# Patient Record
Sex: Male | Born: 1960 | Race: White | Hispanic: No | Marital: Married | Smoking: Never smoker
Health system: Southern US, Community
[De-identification: ages and names within clinical notes are randomized; demographics above are authoritative.]

## PROBLEM LIST (undated history)

## (undated) DIAGNOSIS — F32A Depression, unspecified: Secondary | ICD-10-CM

## (undated) DIAGNOSIS — F329 Major depressive disorder, single episode, unspecified: Secondary | ICD-10-CM

## (undated) DIAGNOSIS — I10 Essential (primary) hypertension: Secondary | ICD-10-CM

## (undated) DIAGNOSIS — Z9221 Personal history of antineoplastic chemotherapy: Secondary | ICD-10-CM

## (undated) DIAGNOSIS — M109 Gout, unspecified: Secondary | ICD-10-CM

## (undated) DIAGNOSIS — N529 Male erectile dysfunction, unspecified: Secondary | ICD-10-CM

## (undated) DIAGNOSIS — Z9981 Dependence on supplemental oxygen: Secondary | ICD-10-CM

## (undated) DIAGNOSIS — C801 Malignant (primary) neoplasm, unspecified: Secondary | ICD-10-CM

## (undated) HISTORY — DX: Depression, unspecified: F32.A

## (undated) HISTORY — PX: PORTACATH PLACEMENT: SHX2246

## (undated) HISTORY — DX: Gout, unspecified: M10.9

## (undated) HISTORY — DX: Essential (primary) hypertension: I10

## (undated) HISTORY — PX: TONSILLECTOMY: SUR1361

## (undated) HISTORY — DX: Major depressive disorder, single episode, unspecified: F32.9

## (undated) HISTORY — DX: Male erectile dysfunction, unspecified: N52.9

---

## 2015-06-23 ENCOUNTER — Encounter: Payer: Self-pay | Admitting: Family Medicine

## 2015-06-23 ENCOUNTER — Ambulatory Visit (INDEPENDENT_AMBULATORY_CARE_PROVIDER_SITE_OTHER): Payer: PRIVATE HEALTH INSURANCE | Admitting: Family Medicine

## 2015-06-23 VITALS — BP 149/93 | HR 76 | Temp 97.6°F | Ht 65.8 in | Wt 247.0 lb

## 2015-06-23 DIAGNOSIS — F329 Major depressive disorder, single episode, unspecified: Secondary | ICD-10-CM

## 2015-06-23 DIAGNOSIS — I10 Essential (primary) hypertension: Secondary | ICD-10-CM

## 2015-06-23 DIAGNOSIS — F32A Depression, unspecified: Secondary | ICD-10-CM | POA: Insufficient documentation

## 2015-06-23 MED ORDER — TELMISARTAN 40 MG PO TABS: 40.0000 mg | ORAL_TABLET | Freq: Every day | ORAL | Status: DC

## 2015-06-23 MED ORDER — AMLODIPINE BESYLATE 10 MG PO TABS: 10.0000 mg | ORAL_TABLET | Freq: Every day | ORAL | Status: DC

## 2015-06-23 MED ORDER — FLUOXETINE HCL 20 MG PO CAPS: 20.0000 mg | ORAL_CAPSULE | Freq: Every day | ORAL | Status: DC

## 2015-06-23 NOTE — Assessment & Plan Note (Signed)
The current medical regimen is effective;  continue present plan and medications.  

## 2015-06-23 NOTE — Assessment & Plan Note (Signed)
poor control blood pressure will start Micardis Continue other medications

## 2015-06-23 NOTE — Progress Notes (Signed)
   BP 149/93 mmHg  Pulse 76  Temp(Src) 97.6 F (36.4 C)  Ht 5' 5.8" (1.671 m)  Wt 247 lb (112.038 kg)  BMI 40.12 kg/m2  SpO2 97%   Subjective:    Patient ID: Stuart Spence, male    DOB: 1960/09/25, 55 y.o.   MRN: [id number]  HPI: Stuart Spence is a 55 y.o. male  Chief Complaint  Patient presents with  . Hypertension  . Depression  . "thing on the back of my head"   patient follow-up hypertension doing well on amlodipine 10 mg but not taking losartan and his insurance quit paying for it blood pressures been elevated as a consequence.  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   Relevant past medical, surgical, family and social history reviewed and updated as indicated. Interim medical history since our last visit reviewed. Allergies and medications reviewed and updated.  Review of Systems  Constitutional: Negative.   Respiratory: Negative.   Cardiovascular: Negative.     Per HPI unless specifically indicated above     Objective:    BP 149/93 mmHg  Pulse 76  Temp(Src) 97.6 F (36.4 C)  Ht 5' 5.8" (1.671 m)  Wt 247 lb (112.038 kg)  BMI 40.12 kg/m2  SpO2 97%  Wt Readings from Last 3 Encounters:  06/23/15 247 lb (112.038 kg)  08/05/14 239 lb (108.41 kg)    Physical Exam  Constitutional: He is oriented to person, place, and time. He appears well-developed and well-nourished. No distress.  HENT:  Head: Normocephalic and atraumatic.  Right Ear: Hearing normal.  Left Ear: Hearing normal.  Nose: Nose normal.  Eyes: Conjunctivae and lids are normal. Right eye exhibits no discharge. Left eye exhibits no discharge. No scleral icterus.  Cardiovascular: Normal rate, regular rhythm and normal heart sounds.   Pulmonary/Chest: Effort normal and breath sounds normal. No respiratory distress.   Musculoskeletal: Normal range of motion.  Neurological: He is alert and oriented to person, place, and time.  Skin: Skin is intact. No rash noted.  Psychiatric: He has a normal mood and affect. His speech is normal and behavior is normal. Judgment and thought content normal. Cognition and memory are normal.    No results found for this or any previous visit.    Assessment & Plan:   Problem List Items Addressed This Visit      Cardiovascular and Mediastinum   Essential hypertension - Primary     poor control blood pressure will start Micardis Continue other medications      Relevant Medications   amLODipine (NORVASC) 10 MG tablet   telmisartan (MICARDIS) 40 MG tablet     Other   Depression    The current medical regimen is effective;  continue present plan and medications.       Relevant Medications   FLUoxetine (PROZAC) 20 MG capsule       Follow up plan: Return in about 4 weeks (around 07/21/2015) for Physical Exam bmp.

## 2015-06-24 ENCOUNTER — Telehealth: Payer: Self-pay

## 2015-06-24 NOTE — Telephone Encounter (Signed)
Patient's insurance will not cover Micardis  Instructed patient to call his insurance and find out what were his preferred drugs and to let us know.

## 2015-06-26 ENCOUNTER — Telehealth: Payer: Self-pay

## 2015-06-26 NOTE — Telephone Encounter (Signed)
Patient's insurance will not cover Micardis  He states they only told him to try an Ace Inhibitor but would not give him a list of preferred medications

## 2015-06-30 MED ORDER — BENAZEPRIL HCL 40 MG PO TABS: 40.0000 mg | ORAL_TABLET | Freq: Every day | ORAL | Status: DC

## 2015-07-10 ENCOUNTER — Telehealth: Payer: Self-pay

## 2015-07-10 NOTE — Telephone Encounter (Signed)
Patient would like Rx for Viagra sent to [facility]  He knows [facility] is out of office until Monday

## 2015-07-14 MED ORDER — SILDENAFIL CITRATE 100 MG PO TABS: 100.0000 mg | ORAL_TABLET | Freq: Every day | ORAL | Status: DC | PRN

## 2015-08-04 ENCOUNTER — Encounter: Payer: PRIVATE HEALTH INSURANCE | Admitting: Family Medicine

## 2015-08-10 ENCOUNTER — Emergency Department: Payer: No Typology Code available for payment source

## 2015-08-10 ENCOUNTER — Inpatient Hospital Stay: Payer: No Typology Code available for payment source

## 2015-08-10 ENCOUNTER — Encounter: Payer: Self-pay | Admitting: Emergency Medicine

## 2015-08-10 ENCOUNTER — Inpatient Hospital Stay
Admission: EM | Admit: 2015-08-10 | Discharge: 2015-08-17 | DRG: 180 | Disposition: A | Discharge status: Home Health | Payer: No Typology Code available for payment source | Attending: Internal Medicine | Admitting: Internal Medicine

## 2015-08-10 DIAGNOSIS — Z8249 Family history of ischemic heart disease and other diseases of the circulatory system: Secondary | ICD-10-CM | POA: Diagnosis not present

## 2015-08-10 DIAGNOSIS — C3411 Malignant neoplasm of upper lobe, right bronchus or lung: Secondary | ICD-10-CM | POA: Diagnosis present

## 2015-08-10 DIAGNOSIS — Z79899 Other long term (current) drug therapy: Secondary | ICD-10-CM

## 2015-08-10 DIAGNOSIS — F329 Major depressive disorder, single episode, unspecified: Secondary | ICD-10-CM | POA: Diagnosis present

## 2015-08-10 DIAGNOSIS — R918 Other nonspecific abnormal finding of lung field: Secondary | ICD-10-CM

## 2015-08-10 DIAGNOSIS — J948 Other specified pleural conditions: Secondary | ICD-10-CM | POA: Diagnosis not present

## 2015-08-10 DIAGNOSIS — J90 Pleural effusion, not elsewhere classified: Secondary | ICD-10-CM | POA: Insufficient documentation

## 2015-08-10 DIAGNOSIS — I10 Essential (primary) hypertension: Secondary | ICD-10-CM | POA: Diagnosis present

## 2015-08-10 DIAGNOSIS — C7951 Secondary malignant neoplasm of bone: Secondary | ICD-10-CM | POA: Diagnosis not present

## 2015-08-10 DIAGNOSIS — J9601 Acute respiratory failure with hypoxia: Secondary | ICD-10-CM | POA: Diagnosis present

## 2015-08-10 DIAGNOSIS — Z09 Encounter for follow-up examination after completed treatment for conditions other than malignant neoplasm: Secondary | ICD-10-CM

## 2015-08-10 DIAGNOSIS — E222 Syndrome of inappropriate secretion of antidiuretic hormone: Secondary | ICD-10-CM | POA: Diagnosis present

## 2015-08-10 DIAGNOSIS — Z833 Family history of diabetes mellitus: Secondary | ICD-10-CM

## 2015-08-10 DIAGNOSIS — F32A Depression, unspecified: Secondary | ICD-10-CM

## 2015-08-10 DIAGNOSIS — R0602 Shortness of breath: Secondary | ICD-10-CM

## 2015-08-10 DIAGNOSIS — J189 Pneumonia, unspecified organism: Secondary | ICD-10-CM | POA: Diagnosis present

## 2015-08-10 DIAGNOSIS — Z801 Family history of malignant neoplasm of trachea, bronchus and lung: Secondary | ICD-10-CM | POA: Diagnosis not present

## 2015-08-10 DIAGNOSIS — J9811 Atelectasis: Secondary | ICD-10-CM | POA: Diagnosis present

## 2015-08-10 DIAGNOSIS — R188 Other ascites: Secondary | ICD-10-CM

## 2015-08-10 DIAGNOSIS — N529 Male erectile dysfunction, unspecified: Secondary | ICD-10-CM | POA: Diagnosis present

## 2015-08-10 DIAGNOSIS — R0600 Dyspnea, unspecified: Secondary | ICD-10-CM | POA: Diagnosis present

## 2015-08-10 DIAGNOSIS — J9819 Other pulmonary collapse: Secondary | ICD-10-CM | POA: Diagnosis not present

## 2015-08-10 DIAGNOSIS — J910 Malignant pleural effusion: Secondary | ICD-10-CM | POA: Diagnosis present

## 2015-08-10 DIAGNOSIS — Z9889 Other specified postprocedural states: Secondary | ICD-10-CM

## 2015-08-10 DIAGNOSIS — R0603 Acute respiratory distress: Secondary | ICD-10-CM

## 2015-08-10 DIAGNOSIS — J984 Other disorders of lung: Secondary | ICD-10-CM

## 2015-08-10 DIAGNOSIS — J9691 Respiratory failure, unspecified with hypoxia: Secondary | ICD-10-CM | POA: Diagnosis present

## 2015-08-10 DIAGNOSIS — M109 Gout, unspecified: Secondary | ICD-10-CM | POA: Diagnosis present

## 2015-08-10 DIAGNOSIS — C3481 Malignant neoplasm of overlapping sites of right bronchus and lung: Secondary | ICD-10-CM | POA: Diagnosis not present

## 2015-08-10 LAB — BLOOD GAS, VENOUS
ACID-BASE EXCESS: 6.5 mmol/L — AB (ref 0.0–3.0)
Bicarbonate: 31.3 mEq/L — ABNORMAL HIGH (ref 21.0–28.0)
O2 SAT: 94.7 %
PCO2 VEN: 44 mmHg (ref 44.0–60.0)
PH VEN: 7.46 — AB (ref 7.320–7.430)
PO2 VEN: 70 mmHg — AB (ref 31.0–45.0)
Patient temperature: 37

## 2015-08-10 LAB — COMPREHENSIVE METABOLIC PANEL
ALBUMIN: 3.2 g/dL — AB (ref 3.5–5.0)
ALK PHOS: 90 U/L (ref 38–126)
ALT: 28 U/L (ref 17–63)
ANION GAP: 10 (ref 5–15)
AST: 25 U/L (ref 15–41)
BILIRUBIN TOTAL: 0.7 mg/dL (ref 0.3–1.2)
BUN: 15 mg/dL (ref 6–20)
CALCIUM: 8.6 mg/dL — AB (ref 8.9–10.3)
CO2: 27 mmol/L (ref 22–32)
CREATININE: 1.01 mg/dL (ref 0.61–1.24)
Chloride: 95 mmol/L — ABNORMAL LOW (ref 101–111)
GFR calc Af Amer: >60 mL/min (ref >60)
GFR calc non Af Amer: >60 mL/min (ref >60)
Glucose, Bld: 154 mg/dL — ABNORMAL HIGH (ref 65–99)
Potassium: 3.7 mmol/L (ref 3.5–5.1)
SODIUM: 132 mmol/L — AB (ref 135–145)
Total Protein: 7.1 g/dL (ref 6.5–8.1)

## 2015-08-10 LAB — CBC
HEMATOCRIT: 38.9 % — AB (ref 40.0–52.0)
HEMOGLOBIN: 13.1 g/dL (ref 13.0–18.0)
MCH: 30.6 pg (ref 26.0–34.0)
MCHC: 33.6 g/dL (ref 32.0–36.0)
MCV: 91.1 fL (ref 80.0–100.0)
Platelets: 372 10*3/uL (ref 150–440)
RBC: 4.27 MIL/uL — AB (ref 4.40–5.90)
RDW: 13.2 % (ref 11.5–14.5)
WBC: 11.7 10*3/uL — ABNORMAL HIGH (ref 3.8–10.6)

## 2015-08-10 LAB — GLUCOSE, CAPILLARY: Glucose-Capillary: 186 mg/dL — ABNORMAL HIGH (ref 65–99)

## 2015-08-10 LAB — OSMOLALITY, URINE: Osmolality, Ur: 253 mOsm/kg — ABNORMAL LOW (ref 300–900)

## 2015-08-10 LAB — SODIUM, URINE, RANDOM: Sodium, Ur: 77 mmol/L

## 2015-08-10 LAB — BRAIN NATRIURETIC PEPTIDE: B NATRIURETIC PEPTIDE 5: 70 pg/mL (ref 0.0–100.0)

## 2015-08-10 LAB — TROPONIN I

## 2015-08-10 MED ORDER — IPRATROPIUM-ALBUTEROL 0.5-2.5 (3) MG/3ML IN SOLN
3.0000 mL | RESPIRATORY_TRACT | Status: DC
  Administered 2015-08-10 – 2015-08-15 (×30): 3 mL via RESPIRATORY_TRACT
  Filled 2015-08-10 (×30): qty 3

## 2015-08-10 MED ORDER — FLUOXETINE HCL 20 MG PO CAPS
20.0000 mg | ORAL_CAPSULE | Freq: Every day | ORAL | Status: DC
  Administered 2015-08-11 – 2015-08-12 (×2): 20 mg via ORAL
  Filled 2015-08-10 (×2): qty 1

## 2015-08-10 MED ORDER — ONDANSETRON HCL 4 MG PO TABS: 4.0000 mg | ORAL_TABLET | Freq: Four times a day (QID) | ORAL | Status: DC | PRN

## 2015-08-10 MED ORDER — OXYCODONE HCL 5 MG PO TABS
5.0000 mg | ORAL_TABLET | ORAL | Status: DC | PRN
  Administered 2015-08-10: 5 mg via ORAL
  Filled 2015-08-10: qty 1

## 2015-08-10 MED ORDER — FUROSEMIDE 10 MG/ML IJ SOLN
40.0000 mg | Freq: Once | INTRAMUSCULAR | Status: DC
  Filled 2015-08-10: qty 4

## 2015-08-10 MED ORDER — BENAZEPRIL HCL 20 MG PO TABS
40.0000 mg | ORAL_TABLET | Freq: Every day | ORAL | Status: DC
  Administered 2015-08-11 – 2015-08-17 (×7): 40 mg via ORAL
  Filled 2015-08-10 (×7): qty 2

## 2015-08-10 MED ORDER — AMLODIPINE BESYLATE 10 MG PO TABS
10.0000 mg | ORAL_TABLET | Freq: Every day | ORAL | Status: DC
  Administered 2015-08-10 – 2015-08-17 (×8): 10 mg via ORAL
  Filled 2015-08-10 (×8): qty 1

## 2015-08-10 MED ORDER — FUROSEMIDE 10 MG/ML IJ SOLN
40.0000 mg | Freq: Two times a day (BID) | INTRAMUSCULAR | Status: DC
  Administered 2015-08-11: 40 mg via INTRAVENOUS
  Filled 2015-08-10: qty 4

## 2015-08-10 MED ORDER — MORPHINE SULFATE (PF) 2 MG/ML IV SOLN
2.0000 mg | INTRAVENOUS | Status: DC | PRN
  Administered 2015-08-10 – 2015-08-16 (×6): 2 mg via INTRAVENOUS
  Filled 2015-08-10 (×6): qty 1

## 2015-08-10 MED ORDER — ACETAMINOPHEN 650 MG RE SUPP: 650.0000 mg | Freq: Four times a day (QID) | RECTAL | Status: DC | PRN

## 2015-08-10 MED ORDER — ACETAMINOPHEN 325 MG PO TABS: 650.0000 mg | ORAL_TABLET | Freq: Four times a day (QID) | ORAL | Status: DC | PRN

## 2015-08-10 MED ORDER — ENOXAPARIN SODIUM 40 MG/0.4ML ~~LOC~~ SOLN
40.0000 mg | SUBCUTANEOUS | Status: DC
  Filled 2015-08-10: qty 0.4

## 2015-08-10 MED ORDER — IOPAMIDOL (ISOVUE-370) INJECTION 76%
75.0000 mL | Freq: Once | INTRAVENOUS | Status: AC | PRN
  Administered 2015-08-10: 75 mL via INTRAVENOUS

## 2015-08-10 MED ORDER — ENOXAPARIN SODIUM 40 MG/0.4ML ~~LOC~~ SOLN
40.0000 mg | SUBCUTANEOUS | Status: DC
  Administered 2015-08-10: 20:00:00 40 mg via SUBCUTANEOUS

## 2015-08-10 MED ORDER — METHYLPREDNISOLONE SODIUM SUCC 40 MG IJ SOLR
40.0000 mg | INTRAMUSCULAR | Status: DC
  Administered 2015-08-10 – 2015-08-12 (×3): 40 mg via INTRAVENOUS
  Filled 2015-08-10 (×3): qty 1

## 2015-08-10 MED ORDER — LABETALOL HCL 5 MG/ML IV SOLN
10.0000 mg | INTRAVENOUS | Status: DC | PRN
  Administered 2015-08-10: 10 mg via INTRAVENOUS
  Filled 2015-08-10: qty 4

## 2015-08-10 MED ORDER — FUROSEMIDE 10 MG/ML IJ SOLN
40.0000 mg | Freq: Once | INTRAMUSCULAR | Status: AC
  Administered 2015-08-10: 20:00:00 40 mg via INTRAVENOUS

## 2015-08-10 MED ORDER — ONDANSETRON HCL 4 MG/2ML IJ SOLN
4.0000 mg | Freq: Four times a day (QID) | INTRAMUSCULAR | Status: DC | PRN
  Administered 2015-08-13: 4 mg via INTRAVENOUS

## 2015-08-10 NOTE — Consult Note (Signed)
PULMONARY / CRITICAL CARE MEDICINE   Name: Stuart Spence MRN: [id number] DOB: [birth year]    ADMISSION DATE:  08/10/2015   CONSULTATION DATE:  08/10/2015  REFERRING MD:  Hospitalist  CHIEF COMPLAINT: lung mass on CT, tachypnea and hypoxia  HISTORY OF PRESENT ILLNESS:  55 YO Caucasian male with a PMH significant for depression, ED, hypertension and gout who presented to teh ED with acute dyspnea. He was initially seen at Dr. Gust Brooms office a week ago and diagnosed with a lung mass. He was due for a biopsy 08/11/15 however, he decided to come to the ED due to worsening dyspnea. His O2 saturation on presentation was ~96% on 4L [place]. He was admitted to the floor but became more tachypneic and hypoxic. CT chest showed a possible neoplastic obstructing right lung mass with complete collapse of the right lung and a large right pleural effusion. PCCM was consulted for further management. Patient is awake and c/o right chest wall pain and persistent dyspnea.    PAST MEDICAL HISTORY :  He  has a past medical history of Depression; Gout; ED (erectile dysfunction); and Hypertension.  PAST SURGICAL HISTORY: He  has past surgical history that includes Tonsillectomy.  No Known Allergies  No current facility-administered medications on file prior to encounter.   Current Outpatient Prescriptions on File Prior to Encounter  Medication Sig  . amLODipine (NORVASC) 10 MG tablet Take 1 tablet (10 mg total) by mouth daily.  . benazepril (LOTENSIN) 40 MG tablet Take 1 tablet (40 mg total) by mouth daily.  . colchicine 0.6 MG tablet Take 0.6 mg by mouth daily as needed (two pills by mouth at onset, the one pill one hour later, maximum three pills per gout flare).  Marland Kitchen FLUoxetine (PROZAC) 20 MG capsule Take 1 capsule (20 mg total) by mouth daily.  . sildenafil (VIAGRA) 100 MG tablet Take 1 tablet (100 mg total) by mouth daily as needed for erectile dysfunction.    FAMILY HISTORY:  His indicated that his mother  is deceased. He indicated that his father is deceased. He indicated that his sister is alive.   SOCIAL HISTORY: He  reports that he has never smoked. He has never used smokeless tobacco. He reports that he does not drink alcohol or use illicit drugs.  REVIEW OF SYSTEMS:   Constitutional: Negative for fever and chills.  HENT: Negative for congestion and rhinorrhea.  Eyes: Negative for redness and visual disturbance.  Respiratory: Positive for shortness of breath, mild lower extremity edema but negative for wheezing.  Cardiovascular: Negative for chest pain and palpitations.  Gastrointestinal: Negative  for nausea , vomiting and abdominal pain and loose stools Genitourinary: Negative for dysuria and urgency.  Musculoskeletal: Negative for myalgias and arthralgias but positive for right chest wall pain.  Skin: Negative for pallor and wound.  Neurological: Negative for dizziness and headaches   SUBJECTIVE:   VITAL SIGNS: BP 134/75 mmHg  Pulse 98  Temp(Src) 98 F (36.7 C) (Oral)  Resp 24  Ht '5\' 7"'$  (1.702 m)  Wt 257 lb 0.9 oz (116.6 kg)  BMI 40.25 kg/m2  SpO2 96%  HEMODYNAMICS:    VENTILATOR SETTINGS: Vent Mode:  [-]  FiO2 (%):  [36 %-42 %] 42 %  INTAKE / OUTPUT:    PHYSICAL EXAMINATION: General: mild respiratory distress Neuro: AAO X4, no focal deficits HEENT: [place]/AT, PERRLA, oral mucosa pink, trachea midline Cardiovascular: RRR, S1/S2, no MRG Lungs:  Increased WOB, limited airflow right lung field, worse in the bases, left lung  field without wheezing Abdomen: Non-distended, soft, normal bowel sounds Musculoskeletal: +ROM Skin: Warm and dry  LABS:  BMET  Recent Labs Lab 08/10/15 1554  NA 132*  K 3.7  CL 95*  CO2 27  BUN 15  CREATININE 1.01  GLUCOSE 154*    Electrolytes  Recent Labs Lab 08/10/15 1554  CALCIUM 8.6*    CBC  Recent Labs Lab 08/10/15 1554  WBC 11.7*  HGB 13.1  HCT 38.9*  PLT 372    Coag's No results for input(s): APTT, INR  in the last 168 hours.  Sepsis Markers No results for input(s): LATICACIDVEN, PROCALCITON, O2SATVEN in the last 168 hours.  ABG No results for input(s): PHART, PCO2ART, PO2ART in the last 168 hours.  Liver Enzymes  Recent Labs Lab 08/10/15 1554  AST 25  ALT 28  ALKPHOS 90  BILITOT 0.7  ALBUMIN 3.2*    Cardiac Enzymes  Recent Labs Lab 08/10/15 1554  TROPONINI <0.03    Glucose  Recent Labs Lab 08/10/15 2305  GLUCAP 186*    Imaging Ct Angio Chest Pe W/cm &/or Wo Cm  08/10/2015  CLINICAL DATA:  55 year old male with shortness of breath. Recent diagnosis of right-sided pulmonary mass of uncertain etiology. EXAM: CT ANGIOGRAPHY CHEST WITH CONTRAST TECHNIQUE: Multidetector CT imaging of the chest was performed using the standard protocol during bolus administration of intravenous contrast. Multiplanar CT image reconstructions and MIPs were obtained to evaluate the vascular anatomy. CONTRAST:  75 cc Isovue 370 COMPARISON:  None. FINDINGS: There is complete collapse of the right lung. There is an ill-defined 4.5 x 4.5 cm area of hypodensity in the right upper lobe with small areas of air bronchogram. This is incompletely characterized but is concerning for a centrally obstructing mass and likely correspond to the mass seen on the recent CT. There is a large right pleural effusion with extension into the posterior mediastinum. Stop the left lung is clear. There is near complete occlusion of the right upper lobe bronchus as well as occlusion of the right middle and right lower lobe bronchi. The left main bronchus and its branches are patent. The thoracic aorta appears unremarkable. Evaluation of the pulmonary arteries is limited due to is respiratory motion artifact and suboptimal visualization of the peripheral branches. No definite pulmonary artery embolus identified. There is no cardiomegaly or pericardial effusion. No hilar or mediastinal adenopathy identified. The esophagus and the  thyroid gland appear grossly unremarkable. There is no axillary adenopathy. The chest wall soft tissues appear unremarkable. There is mild degenerative changes of the spine. There is a 1.6 x 0.8 cm lytic lesion in the posterior aspect of the right for 3. There is also lytic and expansile lesions in the anterior aspect of the right fourth rib with cortical destruction of the rib. The visualized upper abdomen appears unremarkable. Review of the MIP images confirms the above findings. IMPRESSION: Ill-defined hypodense lesion in the right upper lobe concerning for a centrally obstructing neoplastic process. There is complete occlusion of the right upper, right middle, and right lower lobe bronchi with complete collapse of the right lung. Large right pleural effusion. 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 No CT evidence of pulmonary embolism. Electronically Signed   By: Anner Crete M.D.   On: 08/10/2015 21:41   US Abdomen Limited  08/10/2015  CLINICAL DATA:  55 year old male with abnormal chest x-ray. Subsequent encounter. EXAM: LIMITED ABDOMEN ULTRASOUND FOR ASCITES TECHNIQUE: Limited ultrasound survey for ascites was performed in  all four abdominal quadrants. COMPARISON:  08/10/2015 chest x-ray.  No comparison ultrasound. FINDINGS: No ascites visualized. Evaluation limited by patient's habitus and difficulty holding breath. Right-sided pleural effusion. IMPRESSION: No ascites visualized. Evaluation limited by patient's habitus and difficulty holding breath. Right-sided pleural effusion. Electronically Signed   By: Genia Del M.D.   On: 08/10/2015 18:19   Dg Chest Portable 1 View  08/10/2015  CLINICAL DATA:  55 year old male with a history of cold sweats and difficulty breathing. History of potential right lung mass. EXAM: PORTABLE CHEST 1 VIEW COMPARISON:  None. FINDINGS: Complete opacification of the right hemi thorax. No significant leftward shift. Tracheal  air stripe maintained. Aeration on the left relatively maintained. No evidence of left-sided pneumothorax, confluent airspace disease, or pleural effusion. IMPRESSION: Complete opacification the right hemi thorax, potentially a combination of pleural effusion, atelectasis, and/ or consolidation. Given history of mass, with no available comparison. Correlation with any outside imaging may be useful if there is concern for malignancy. Signed, Dulcy Fanny. Earleen Newport, DO [facility] Radiology Electronically Signed   By: Corrie Mckusick D.O.   On: 08/10/2015 16:56    STUDIES:  none  CULTURES: MRSA screen-negative Blood cultures X 2 05/07>  ANTIBIOTICS: Levaquin  SIGNIFICANT EVENTS: 05/07>Admitted with lung mass, right pleural effusion and right lung collapse  LINES/TUBES: PIVs  DISCUSSION: 54 yo male presenting with possible neoplastic lung mass, total right lung collapse, large right pleural effusion and post-obstruction pneumonia; hypoxic requiring high flow [place].   ASSESSMENT / PLAN:  PULMONARY A: Acute respiratory failure 2/2 occlusive right lung mass and pleural effusion Post-obstructive pneumonia Right pleural effusion Right lung collapse P:   -Supplemental O2 with HFNC as tolerated -Nebulized bronchodilator -US guided thoracentesis with cultures and cytology -Empiric Abx -Will probably need a diagnostic bronch if pleural fluid is non-diagnostic  CARDIOVASCULAR A:  H/o Hypertension P:  -Continue home BP meds  RENAL A:   Hyponatremia P:   -monitor and replace lectrolytes  INFECTIOUS A:   Post-obstructive pneumonia P:   -Levaquin -F/U cultures  MS A:   H/O Gout  P:   Continue home meds  NEUROLOGIC A:   Depression ED P:   RASS goal: N/A -Monitor neuro status -Resume home antidepressant   Disposition and family update: Patient's wife and sisters updated at bedside. Will await culture/cytology results from  thoracentesis.  Best Practice: Code Status:  Full. Diet: NPO for thora GI prophylaxis:  n/a. VTE prophylaxis:  SCD's / lovenox   Magdalene S. [facility] ANP-BC Pulmonary and [place] Pager [phone] or [phone]  08/10/2015, 11:26 PM  Pt seen by NP, reviewed physical exam, assessment and plan and agree. Agree with thoracentesis of right pleural effusion. Marda Stalker, M.D.  08/11/2015

## 2015-08-10 NOTE — Progress Notes (Signed)
Pt wife, [name] [phone]) notified pt will be transferred to ICU.

## 2015-08-10 NOTE — H&P (Signed)
[place] [facility] [place] NAME: Stuart Spence    MR#:  [id number]  DATE OF BIRTH:  Apr 22, 1960   DATE OF ADMISSION:  08/10/2015  PRIMARY CARE PHYSICIAN: Golden Pop, MD   REQUESTING/REFERRING PHYSICIAN: Mariea Clonts  CHIEF COMPLAINT:   Chief Complaint  Patient presents with  . Respiratory Distress    HISTORY OF PRESENT ILLNESS:  Stuart Spence  is a 55 y.o. male with a known history of Essential hypertension who is presenting with shortness of breath. He describes having progressive shortness of breath originally dyspnea on exertion but now even at rest for about 1 month. He went to an outside facility about a week ago given symptoms of nonproductive cough and shortness of breath pleuritic chest pain at that time had a chest x-ray and CAT scan performed which revealed a right lung mass she is to follow-up with Dr. Raul Del of pulmonary 5/8/ 2017 for potential biopsy. In the interim his symptoms have progressed he also complains of lower extremity edema and abdominal fullness without nausea vomiting or constipation. He is apparently had around 20 pound weight gain in the past 1-2 weeks. PAST MEDICAL HISTORY:   Past Medical History  Diagnosis Date  . Depression   . Gout   . ED (erectile dysfunction)     PAST SURGICAL HISTORY:   Past Surgical History  Procedure Laterality Date  . Tonsillectomy      SOCIAL HISTORY:   Social History  Substance Use Topics  . Smoking status: Never Smoker   . Smokeless tobacco: Never Used  . Alcohol Use: No     Comment: gave it up in August    FAMILY HISTORY:   Family History  Problem Relation Age of Onset  . Cancer Mother 24    lung  . Heart attack Father 72  . Hypertension Father   . Congestive Heart Failure Father 34    died from  . Diabetes Sister     DRUG ALLERGIES:  No Known Allergies  REVIEW OF SYSTEMS:  REVIEW OF SYSTEMS:  CONSTITUTIONAL: Denies fevers, chills, Positive fatigue, weakness.    EYES: Denies blurred vision, double vision, or eye pain.  EARS, NOSE, THROAT: Denies tinnitus, ear pain, hearing loss.  RESPIRATORY: Positive cough, shortness of breath, denies wheezing  CARDIOVASCULAR: Denies chest pain, palpitations, positive edema.  GASTROINTESTINAL: Denies nausea, vomiting, diarrhea, abdominal pain.  GENITOURINARY: Denies dysuria, hematuria.  ENDOCRINE: Denies nocturia or thyroid problems. HEMATOLOGIC AND LYMPHATIC: Denies easy bruising or bleeding.  SKIN: Denies rash or lesions.  MUSCULOSKELETAL: Denies pain in neck, back, shoulder, knees, hips, or further arthritic symptoms.  NEUROLOGIC: Denies paralysis, paresthesias.  PSYCHIATRIC: Denies anxiety or depressive symptoms. Otherwise full review of systems performed by me is negative.   MEDICATIONS AT HOME:   Prior to Admission medications   Medication Sig Start Date End Date Taking? Authorizing Provider  amLODipine (NORVASC) 10 MG tablet Take 1 tablet (10 mg total) by mouth daily. 06/23/15   Guadalupe Maple, MD  benazepril (LOTENSIN) 40 MG tablet Take 1 tablet (40 mg total) by mouth daily. 06/30/15   Guadalupe Maple, MD  colchicine 0.6 MG tablet Take 0.6 mg by mouth daily as needed (two pills by mouth at onset, the one pill one hour later, maximum three pills per gout flare).    Historical Provider, MD  FLUoxetine (PROZAC) 20 MG capsule Take 1 capsule (20 mg total) by mouth daily. 06/23/15   Guadalupe Maple, MD  sildenafil (VIAGRA)  100 MG tablet Take 1 tablet (100 mg total) by mouth daily as needed for erectile dysfunction. 07/14/15   Guadalupe Maple, MD      VITAL SIGNS:  Blood pressure 131/85, pulse 102, temperature 98.2 F (36.8 C), temperature source Oral, resp. rate 17, height '5\' 7"'$  (1.702 m), weight 116.121 kg (256 lb), SpO2 95 %.  PHYSICAL EXAMINATION:  VITAL SIGNS: Filed Vitals:   08/10/15 1615 08/10/15 1630  BP:  131/85  Pulse: 107 102  Temp:    Resp: 23 17   GENERAL:54 y.o.male currently in no  acute distress.  HEAD: Normocephalic, atraumatic.  EYES: Pupils equal, round, reactive to light. Extraocular muscles intact. No scleral icterus.  MOUTH: Moist mucosal membrane. Dentition intact. No abscess noted.  EAR, NOSE, THROAT: Clear without exudates. No external lesions.  NECK: Supple. No thyromegaly. No nodules. No JVD.  PULMONARY: Diminished breath sounds on the right to about the mid lung field without wheeze rails or rhonci. No use of accessory muscles, Good respiratory effort. Poor air entry bilaterally CHEST: Nontender to palpation.  CARDIOVASCULAR: S1 and S2. Tachycardic No murmurs, rubs, or gallops. 2 plus edema. Pedal pulses 2+ bilaterally.  GASTROINTESTINAL: Tense, nontender, nondistended. No masses. Positive bowel sounds. No hepatosplenomegaly.  MUSCULOSKELETAL: No swelling, clubbing, or edema. Range of motion full in all extremities.  NEUROLOGIC: Cranial nerves II through XII are intact. No gross focal neurological deficits. Sensation intact. Reflexes intact.  SKIN: No ulceration, lesions, rashes, or cyanosis. Skin warm and dry. Turgor intact.  PSYCHIATRIC: Mood, affect within normal limits. The patient is awake, alert and oriented x 3. Insight, judgment intact.    LABORATORY PANEL:   CBC  Recent Labs Lab 08/10/15 1554  WBC 11.7*  HGB 13.1  HCT 38.9*  PLT 372   ------------------------------------------------------------------------------------------------------------------  Chemistries   Recent Labs Lab 08/10/15 1554  NA 132*  K 3.7  CL 95*  CO2 27  GLUCOSE 154*  BUN 15  CREATININE 1.01  CALCIUM 8.6*  AST 25  ALT 28  ALKPHOS 90  BILITOT 0.7   ------------------------------------------------------------------------------------------------------------------  Cardiac Enzymes  Recent Labs Lab 08/10/15 1554  TROPONINI <0.03    ------------------------------------------------------------------------------------------------------------------  RADIOLOGY:  Dg Chest Portable 1 View  08/10/2015  CLINICAL DATA:  55 year old male with a history of cold sweats and difficulty breathing. History of potential right lung mass. EXAM: PORTABLE CHEST 1 VIEW COMPARISON:  None. FINDINGS: Complete opacification of the right hemi thorax. No significant leftward shift. Tracheal air stripe maintained. Aeration on the left relatively maintained. No evidence of left-sided pneumothorax, confluent airspace disease, or pleural effusion. IMPRESSION: Complete opacification the right hemi thorax, potentially a combination of pleural effusion, atelectasis, and/ or consolidation. Given history of mass, with no available comparison. Correlation with any outside imaging may be useful if there is concern for malignancy. Signed, Dulcy Fanny. Earleen Newport, DO [facility] Radiology Electronically Signed   By: Corrie Mckusick D.O.   On: 08/10/2015 16:56    EKG:   Orders placed or performed during the hospital encounter of 08/10/15  . EKG 12-Lead  . EKG 12-Lead    IMPRESSION AND PLAN:   55 year old Caucasian gentleman history of essential hypertension presenting with shortness of breath. Recently diagnosed with right lung mass. Patient is a nonsmoker but has worked 36 years in the [employer]   1. Acute respiratory insufficiency with hypoxia secondary to pleural effusion. We will diuresis with Lasix, follow in and out, likely require thoracentesis. Consult pulmonary, scheduled breathing treatments  will check CT rule out pulmonary embolism given acute change, tachycardia, tachypnea, concern for malignancy 2. Anasarca: Diuresis, check TSH, check abdominal ultrasound potentially require paracentesis if large volume ascites at that time would cancel thoracentesis 3. Essential hypertension Benzepril, Norvasc 4.  Hyponatremia with volume overload state: Continue diuresis check urine sodium and osmolality 5. Venous thromboembolism prophylactic: Lovenox    All the records are reviewed and case discussed with ED provider. Management plans discussed with the patient, family and they are in agreement.  CODE STATUS: Full  TOTAL TIME TAKING CARE OF THIS PATIENT: 45 minutes.    Tannor Pyon,  Karenann Cai.D on 08/10/2015 at 5:21 PM  Between 7am to 6pm - Pager - [phone]  After 6pm: House Pager: - [place] [facility]  Office  [phone]  CC: Primary care physician; Golden Pop, MD

## 2015-08-10 NOTE — ED Provider Notes (Signed)
[facility] Emergency Department Provider Note  ____________________________________________  Time seen: Approximately 3:52 PM  I have reviewed the triage vital signs and the nursing notes.   HISTORY  Chief Complaint Respiratory Distress    HPI Stuart Spence is a 55 y.o. male with a recent diagnosis of right sided pulmonary mass of unclear etiology at this time, presenting for shortness of breath. The patient reports 1 month of intermittent shortness of breath which is worse with exertion and laying down. He was feeling poorly for days ago was brought to an outside Emergency Department with a CT scanwhich showed a right-sided mass, and he has follow-up with oncology tomorrow. Today, he was sitting at home and eating when he developed acute onset of worsening shortness of breath. He also reports night sweats, and a 20 pound weight gain in the last week. He denies any history of congestive heart failure. He denies any lower extremity swelling or calf pain, fever, chills, cough or cold symptoms, ear pain or sore throat. He has not been experiencing any chest pain, palpitations, lightheadedness or syncope. Denies any recent travel outside the [place], or incarceration. Per EMS, sats were 91% on room air.   Past Medical History  Diagnosis Date  . Depression   . Gout   . ED (erectile dysfunction)     Patient Active Problem List   Diagnosis Date Noted  . Essential hypertension 06/23/2015  . Depression 06/23/2015    Past Surgical History  Procedure Laterality Date  . Tonsillectomy      Current Outpatient Rx  Name  Route  Sig  Dispense  Refill  . amLODipine (NORVASC) 10 MG tablet   Oral   Take 1 tablet (10 mg total) by mouth daily.   30 tablet   6   . aspirin EC 81 MG tablet   Oral   Take 81 mg by mouth daily.         . benazepril (LOTENSIN) 40 MG tablet   Oral   Take 1 tablet (40 mg total) by mouth daily.   30 tablet   6   . colchicine 0.6  MG tablet   Oral   Take 0.6 mg by mouth daily as needed (two pills by mouth at onset, the one pill one hour later, maximum three pills per gout flare).         Marland Kitchen FLUoxetine (PROZAC) 20 MG capsule   Oral   Take 1 capsule (20 mg total) by mouth daily.   30 capsule   6   . sildenafil (VIAGRA) 100 MG tablet   Oral   Take 1 tablet (100 mg total) by mouth daily as needed for erectile dysfunction.   10 tablet   12     Allergies Review of patient's allergies indicates no known allergies.  Family History  Problem Relation Age of Onset  . Cancer Mother 76    lung  . Heart attack Father 41  . Hypertension Father   . Congestive Heart Failure Father 86    died from  . Diabetes Sister     Social History Social History  Substance Use Topics  . Smoking status: Never Smoker   . Smokeless tobacco: Never Used  . Alcohol Use: No     Comment: gave it up in August    Review of Systems Constitutional: Positive night sweats. Negative fever or chills. +20 pound weight gain. Eyes: No visual changes. ENT: No sore throat. No congestion or rhinorrhea. Cardiovascular: Denies chest  pain. Denies palpitations. Respiratory: Positive shortness of breath.  No cough. Gastrointestinal: No abdominal pain.  No nausea, no vomiting.  No diarrhea.  No constipation. Genitourinary: Negative for dysuria. Musculoskeletal: Negative for back pain. Skin: Negative for rash. Neurological: Negative for headaches. No focal numbness, tingling or weakness.   10-point ROS otherwise negative.  ____________________________________________   PHYSICAL EXAM:  VITAL SIGNS: ED Triage Vitals  Enc Vitals Group     BP 08/10/15 1545 144/117 mmHg     Pulse Rate 08/10/15 1545 109     Resp 08/10/15 1545 22     Temp 08/10/15 1545 98.2 F (36.8 C)     Temp Source 08/10/15 1545 Oral     SpO2 08/10/15 1545 96 %     Weight 08/10/15 1545 256 lb (116.121 kg)     Height 08/10/15 1545 '5\' 7"'$  (1.702 m)     Head Cir --       Peak Flow --      Pain Score --      Pain Loc --      Pain Edu? --      Excl. in [place]? --     Constitutional: Alert and oriented. Tachypnea And uncomfortable appearing but able to answer questions appropriately, mentating normally and speaking in full sentences.  Eyes: Conjunctivae are normal.  EOMI. No scleral icterus. No eye discharge. Head: Atraumatic. Nose: No congestion/rhinnorhea. Mouth/Throat: Mucous membranes are moist.  Neck: No stridor.  Supple.  No JVD. Cardiovascular: Fast rate, regular rhythm. No murmurs, rubs or gallops.  Respiratory: Positive tachypnea with mild accessory muscle use and mild retractions. The left lung is clear throughout the entire lung field, but the right lung has decreased breath sounds at the base. He has fair air exchange in the upper right side. No wheezes, rales or rhonchi. Gastrointestinal: Obese. Soft, nontender and nondistended.  No guarding or rebound.  No peritoneal signs. Musculoskeletal: No LE edema. No ttp in the calves or palpable cords.  Negative Homan's sign. Neurologic:  A&Ox3.  Speech is clear.  Face and smile are symmetric.  EOMI.  Moves all extremities well. Skin:  Skin is warm, dry and intact. No rash noted. Psychiatric: Mood and affect are normal. Speech and behavior are normal.  Normal judgement.  ____________________________________________   LABS (all labs ordered are listed, but only abnormal results are displayed)  Labs Reviewed  BLOOD GAS, VENOUS - Abnormal; Notable for the following:    pH, Ven 7.46 (*)    pO2, Ven 70.0 (*)    Bicarbonate 31.3 (*)    Acid-Base Excess 6.5 (*)    All other components within normal limits  CBC - Abnormal; Notable for the following:    WBC 11.7 (*)    RBC 4.27 (*)    HCT 38.9 (*)    All other components within normal limits  COMPREHENSIVE METABOLIC PANEL - Abnormal; Notable for the following:    Sodium 132 (*)    Chloride 95 (*)    Glucose, Bld 154 (*)    Calcium 8.6 (*)    Albumin  3.2 (*)    All other components within normal limits  CULTURE, BLOOD (ROUTINE X 2)  CULTURE, BLOOD (ROUTINE X 2)  BRAIN NATRIURETIC PEPTIDE  TROPONIN I   ____________________________________________  EKG  ED ECG REPORT I, Eula Listen, the attending physician, personally viewed and interpreted this ECG.   Date: 08/10/2015  EKG Time: 1547  Rate: 109  Rhythm: sinus tachycardia  Axis: normal  Intervals:none  ST&T Change: no ST elevation  ____________________________________________  RADIOLOGY  No results found.  ____________________________________________   PROCEDURES  Procedure(s) performed: None  Critical Care performed: No ____________________________________________   INITIAL IMPRESSION / ASSESSMENT AND PLAN / ED COURSE  Pertinent labs & imaging results that were available during my care of the patient were reviewed by me and considered in my medical decision making (see chart for details).  55 y.o. with right lung mass of unknown etiology presenting with acute onset of shortness of breath and decreased breath sounds on my examination. Overall, on 2 L cannula, the patient's oxygenation remains above 95%, but he is showing some signs of respiratory compromise. I will start with a portable chest x-ray to rule out pneumothorax although he does have breath sounds on the sides of this less likely, and if we are unable to procure the CT scan results, we will repeat that study today.  ----------------------------------------- 4:49 PM on 08/10/2015 -----------------------------------------  At this time, the patient's respiratory status has improved. His tachypnea is better, and he has continued to have oxygen saturations of greater than 95%. I've been able to wean his oxygen back to 2 L nasal cannula. His VBG is reassuring and his troponin is negative. His BNP also is not elevated. Most likely etiology of the patient's symptoms is the pleural effusion with the  underlying lung mass. The patient's wife has the report from Thursday, which reports a right upper lobe lung mass and diffuse right pleural effusion with significant hilar lymphadenopathy. ___________________ _________________________  FINAL CLINICAL IMPRESSION(S) / ED DIAGNOSES  Final diagnoses:  Respiratory distress  Cavitating mass in right upper lung lobe  Pleural effusion, right      NEW MEDICATIONS STARTED DURING THIS VISIT:  New Prescriptions   No medications on file     Eula Listen, MD 08/10/15 1650

## 2015-08-10 NOTE — Progress Notes (Signed)
Pt admitted for progressive SOB and hypoxia.  Recently found to have lung mass.  Called by nursing with results of CTA chest today which shows total right lung collapse, likely due to obstruction from neoplasm.  Patient O2 sats mid 90's on 4L O2 [place] right now, but with some increased work of breathing.  Will transfer to ICU and consult intensivist as pt is very likely to need intervention.  Jacqulyn Bath [facility] 08/10/2015, 10:16 PM

## 2015-08-10 NOTE — ED Notes (Signed)
Pt presents to ED from home c/o cold sweats that wake him up at night and breathing difficulty . Pt had CT that shoed right lung mass; biopsy with dr.fleming tomorrow. Currently on nasal  Cannula 4L at 95-96%. Alert and oriented x4

## 2015-08-10 NOTE — Progress Notes (Signed)
Dr. Jannifer Franklin notified pt continues to have cool sweats, labored breathing, SOB at rest sitting straight up. Lasix and Solumedrol given as ordered with no noted changes. CT Angio shows complete collapse of Right lung. Will continue to closely monitor as Dr. Jannifer Franklin modifies plan of care and places new orders.

## 2015-08-11 ENCOUNTER — Inpatient Hospital Stay: Payer: No Typology Code available for payment source

## 2015-08-11 LAB — BODY FLUID CELL COUNT WITH DIFFERENTIAL
Eos, Fluid: 0 %
LYMPHS FL: 55 %
MONOCYTE-MACROPHAGE-SEROUS FLUID: 13 %
NEUTROPHIL FLUID: 32 %
Other Cells, Fluid: 0 %
WBC FLUID: 823 uL

## 2015-08-11 LAB — GLUCOSE, CAPILLARY
GLUCOSE-CAPILLARY: 116 mg/dL — AB (ref 65–99)
GLUCOSE-CAPILLARY: 107 mg/dL — AB (ref 65–99)
Glucose-Capillary: 123 mg/dL — ABNORMAL HIGH (ref 65–99)

## 2015-08-11 LAB — BASIC METABOLIC PANEL
Anion gap: 8 (ref 5–15)
BUN: 16 mg/dL (ref 6–20)
CALCIUM: 8.8 mg/dL — AB (ref 8.9–10.3)
CO2: 30 mmol/L (ref 22–32)
CREATININE: 1.13 mg/dL (ref 0.61–1.24)
Chloride: 93 mmol/L — ABNORMAL LOW (ref 101–111)
GFR calc non Af Amer: >60 mL/min (ref >60)
GLUCOSE: 196 mg/dL — AB (ref 65–99)
Potassium: 4.2 mmol/L (ref 3.5–5.1)
Sodium: 131 mmol/L — ABNORMAL LOW (ref 135–145)

## 2015-08-11 LAB — CBC
HEMATOCRIT: 38.8 % — AB (ref 40.0–52.0)
HEMOGLOBIN: 13.3 g/dL (ref 13.0–18.0)
MCH: 31 pg (ref 26.0–34.0)
MCHC: 34.2 g/dL (ref 32.0–36.0)
MCV: 90.6 fL (ref 80.0–100.0)
Platelets: 385 10*3/uL (ref 150–440)
RBC: 4.28 MIL/uL — ABNORMAL LOW (ref 4.40–5.90)
RDW: 13.1 % (ref 11.5–14.5)
WBC: 11.1 10*3/uL — ABNORMAL HIGH (ref 3.8–10.6)

## 2015-08-11 LAB — LACTATE DEHYDROGENASE: LDH: 192 U/L (ref 98–192)

## 2015-08-11 LAB — MRSA PCR SCREENING: MRSA by PCR: NEGATIVE

## 2015-08-11 LAB — GLUCOSE, SEROUS FLUID: GLUCOSE FL: 90 mg/dL

## 2015-08-11 LAB — LACTATE DEHYDROGENASE, PLEURAL OR PERITONEAL FLUID: LD, Fluid: 1570 U/L — ABNORMAL HIGH (ref 3–23)

## 2015-08-11 LAB — ALBUMIN: ALBUMIN: 3.2 g/dL — AB (ref 3.5–5.0)

## 2015-08-11 LAB — AMYLASE: AMYLASE: 50 U/L (ref 28–100)

## 2015-08-11 LAB — PROTEIN, BODY FLUID: Total protein, fluid: 5 g/dL

## 2015-08-11 MED ORDER — ENOXAPARIN SODIUM 40 MG/0.4ML ~~LOC~~ SOLN
40.0000 mg | Freq: Two times a day (BID) | SUBCUTANEOUS | Status: DC
  Administered 2015-08-11 – 2015-08-12 (×3): 40 mg via SUBCUTANEOUS
  Filled 2015-08-11 (×3): qty 0.4

## 2015-08-11 MED ORDER — LEVOFLOXACIN IN D5W 750 MG/150ML IV SOLN
750.0000 mg | INTRAVENOUS | Status: DC
  Administered 2015-08-11: 750 mg via INTRAVENOUS
  Filled 2015-08-11 (×2): qty 150

## 2015-08-11 MED ORDER — INSULIN ASPART 100 UNIT/ML ~~LOC~~ SOLN
0.0000 [IU] | Freq: Three times a day (TID) | SUBCUTANEOUS | Status: DC
  Administered 2015-08-12: 2 [IU] via SUBCUTANEOUS
  Administered 2015-08-12: 3 [IU] via SUBCUTANEOUS
  Administered 2015-08-13: 2 [IU] via SUBCUTANEOUS
  Administered 2015-08-13 – 2015-08-14 (×3): 3 [IU] via SUBCUTANEOUS
  Administered 2015-08-15 (×2): 2 [IU] via SUBCUTANEOUS
  Administered 2015-08-16 – 2015-08-17 (×3): 3 [IU] via SUBCUTANEOUS
  Filled 2015-08-11: qty 3
  Filled 2015-08-11: qty 2
  Filled 2015-08-11 (×2): qty 3
  Filled 2015-08-11 (×2): qty 2
  Filled 2015-08-11 (×3): qty 3
  Filled 2015-08-11 (×2): qty 2

## 2015-08-11 MED ORDER — INSULIN ASPART 100 UNIT/ML ~~LOC~~ SOLN
0.0000 [IU] | Freq: Every day | SUBCUTANEOUS | Status: DC
  Administered 2015-08-13: 2 [IU] via SUBCUTANEOUS
  Filled 2015-08-11: qty 2
  Filled 2015-08-11: qty 3
  Filled 2015-08-11: qty 2

## 2015-08-11 MED ORDER — LEVOFLOXACIN IN D5W 750 MG/150ML IV SOLN
750.0000 mg | INTRAVENOUS | Status: DC
  Administered 2015-08-12: 750 mg via INTRAVENOUS
  Filled 2015-08-11 (×2): qty 150

## 2015-08-11 NOTE — Procedures (Signed)
Successful US guided right sided thoracentesis yielding 2.4 L of brown colored, serous pleural fluid.   Samples sent to lab for analysis. EBL: None No immediate complications.  Ronny Bacon, MD Pager #: [phone]

## 2015-08-11 NOTE — Progress Notes (Signed)
Dr Pascal Lux, radiologist at bedside to perform thoracentesis. VSS, 112/70, HR 100, 97% on HFNC.40%. Consent signed on chart.

## 2015-08-11 NOTE — Progress Notes (Signed)
[place] [facility] [place] NAME: Stuart Spence    MR#:  [id number]  DATE OF BIRTH:  05/16/1960  SUBJECTIVE:  CHIEF COMPLAINT:   Chief Complaint  Patient presents with  . Respiratory Distress   Admitted for hypoxic respiratory failure and found to have right lung mass with right-sided collapse and pleural effusion. Increase to high flow nasal cannula due to worsening breathing. He has a dry cough. Afebrile.  REVIEW OF SYSTEMS:    Review of Systems  Constitutional: Positive for malaise/fatigue. Negative for fever and chills.  HENT: Negative for sore throat.   Eyes: Negative for blurred vision, double vision and pain.  Respiratory: Positive for cough and shortness of breath. Negative for hemoptysis and wheezing.   Cardiovascular: Negative for chest pain, palpitations, orthopnea and leg swelling.  Gastrointestinal: Negative for heartburn, nausea, vomiting, abdominal pain, diarrhea and constipation.  Genitourinary: Negative for dysuria and hematuria.  Musculoskeletal: Negative for back pain and joint pain.  Skin: Negative for rash.  Neurological: Positive for weakness. Negative for sensory change, speech change, focal weakness and headaches.  Endo/Heme/Allergies: Does not bruise/bleed easily.  Psychiatric/Behavioral: Negative for depression. The patient is not nervous/anxious.     DRUG ALLERGIES:  No Known Allergies  VITALS:  Blood pressure 153/81, pulse 96, temperature 97.4 F (36.3 C), temperature source Oral, resp. rate 19, height '5\' 7"'$  (1.702 m), weight 116.6 kg (257 lb 0.9 oz), SpO2 96 %.  PHYSICAL EXAMINATION:   Physical Exam  GENERAL:  55 y.o.-year-old patient lying in the bed with Respiratory distress. Looks critically ill. EYES: Pupils equal, round, reactive to light and accommodation. No scleral icterus. Extraocular muscles intact.  HEENT: Head atraumatic, normocephalic. Oropharynx and nasopharynx clear.  NECK:  Supple,  no jugular venous distention. No thyroid enlargement, no tenderness.  LUNGS: Increased work of breathing. Decreased air entry on the right side. No wheezing. CARDIOVASCULAR: S1, S2 normal. No murmurs, rubs, or gallops.  ABDOMEN: Soft, nontender, nondistended. Bowel sounds present. No organomegaly or mass.  EXTREMITIES: No cyanosis, clubbing or edema b/l.    NEUROLOGIC: Cranial nerves II through XII are intact. No focal Motor or sensory deficits b/l.   PSYCHIATRIC: The patient is alert and oriented x 3.  SKIN: No obvious rash, lesion, or ulcer.   LABORATORY PANEL:   CBC  Recent Labs Lab 08/11/15 0320  WBC 11.1*  HGB 13.3  HCT 38.8*  PLT 385   ------------------------------------------------------------------------------------------------------------------ Chemistries   Recent Labs Lab 08/10/15 1554 08/11/15 0320  NA 132* 131*  K 3.7 4.2  CL 95* 93*  CO2 27 30  GLUCOSE 154* 196*  BUN 15 16  CREATININE 1.01 1.13  CALCIUM 8.6* 8.8*  AST 25  --   ALT 28  --   ALKPHOS 90  --   BILITOT 0.7  --    ------------------------------------------------------------------------------------------------------------------  Cardiac Enzymes  Recent Labs Lab 08/10/15 1554  TROPONINI <0.03   ------------------------------------------------------------------------------------------------------------------  RADIOLOGY:  Ct Angio Chest Pe W/cm &/or Wo Cm  08/10/2015  CLINICAL DATA:  55 year old male with shortness of breath. Recent diagnosis of right-sided pulmonary mass of uncertain etiology. EXAM: CT ANGIOGRAPHY CHEST WITH CONTRAST TECHNIQUE: Multidetector CT imaging of the chest was performed using the standard protocol during bolus administration of intravenous contrast. Multiplanar CT image reconstructions and MIPs were obtained to evaluate the vascular anatomy. CONTRAST:  75 cc Isovue 370 COMPARISON:  None. FINDINGS: There is complete collapse of the right lung. There is an ill-defined  4.5 x  4.5 cm area of hypodensity in the right upper lobe with small areas of air bronchogram. This is incompletely characterized but is concerning for a centrally obstructing mass and likely correspond to the mass seen on the recent CT. There is a large right pleural effusion with extension into the posterior mediastinum. Stop the left lung is clear. There is near complete occlusion of the right upper lobe bronchus as well as occlusion of the right middle and right lower lobe bronchi. The left main bronchus and its branches are patent. The thoracic aorta appears unremarkable. Evaluation of the pulmonary arteries is limited due to is respiratory motion artifact and suboptimal visualization of the peripheral branches. No definite pulmonary artery embolus identified. There is no cardiomegaly or pericardial effusion. No hilar or mediastinal adenopathy identified. The esophagus and the thyroid gland appear grossly unremarkable. There is no axillary adenopathy. The chest wall soft tissues appear unremarkable. There is mild degenerative changes of the spine. There is a 1.6 x 0.8 cm lytic lesion in the posterior aspect of the right for 3. There is also lytic and expansile lesions in the anterior aspect of the right fourth rib with cortical destruction of the rib. The visualized upper abdomen appears unremarkable. Review of the MIP images confirms the above findings. IMPRESSION: Ill-defined hypodense lesion in the right upper lobe concerning for a centrally obstructing neoplastic process. There is complete occlusion of the right upper, right middle, and right lower lobe bronchi with complete collapse of the right lung. Large right pleural effusion. 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 No CT evidence of pulmonary embolism. Electronically Signed   By: Anner Crete M.D.   On: 08/10/2015 21:41   US Abdomen Limited  08/10/2015  CLINICAL DATA:  55 year old male with abnormal  chest x-ray. Subsequent encounter. EXAM: LIMITED ABDOMEN ULTRASOUND FOR ASCITES TECHNIQUE: Limited ultrasound survey for ascites was performed in all four abdominal quadrants. COMPARISON:  08/10/2015 chest x-ray.  No comparison ultrasound. FINDINGS: No ascites visualized. Evaluation limited by patient's habitus and difficulty holding breath. Right-sided pleural effusion. IMPRESSION: No ascites visualized. Evaluation limited by patient's habitus and difficulty holding breath. Right-sided pleural effusion. Electronically Signed   By: Genia Del M.D.   On: 08/10/2015 18:19   Dg Chest Portable 1 View  08/10/2015  CLINICAL DATA:  55 year old male with a history of cold sweats and difficulty breathing. History of potential right lung mass. EXAM: PORTABLE CHEST 1 VIEW COMPARISON:  None. FINDINGS: Complete opacification of the right hemi thorax. No significant leftward shift. Tracheal air stripe maintained. Aeration on the left relatively maintained. No evidence of left-sided pneumothorax, confluent airspace disease, or pleural effusion. IMPRESSION: Complete opacification the right hemi thorax, potentially a combination of pleural effusion, atelectasis, and/ or consolidation. Given history of mass, with no available comparison. Correlation with any outside imaging may be useful if there is concern for malignancy. Signed, Dulcy Fanny. Earleen Newport, DO [facility] Electronically Signed   By: Corrie Mckusick D.O.   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 Patient is scheduled for a bedside thoracentesis later. Pulmonology consulted and appreciate help. Hopefully his respiratory status will improve post thoracentesis. Presently is on high flow nasal cannula and critically ill. Will likely need bronchoscopy for tissue biopsy.  Oncology follow-up once path results available. On Levaquin due  to concern for postobstructive pneumonia.  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 Does not seem fluid overloaded. Stop Lasix.  * Hypertension Continue Norvasc and benazepril.  * DVT prophylaxis with Lovenox   All the records are reviewed and case discussed with Care Management/Social Workerr. Management plans discussed with the patient, family and they are in agreement.  CODE STATUS: Full code  DVT Prophylaxis: SCDs  TOTAL CC TIME TAKING CARE OF THIS PATIENT: 35 minutes.   POSSIBLE D/C IN 2-3 DAYS, DEPENDING ON CLINICAL CONDITION.  Hillary Bow R M.D on 08/11/2015 at 9:20 AM  Between 7am to 6pm - Pager - [phone]  After 6pm go to [email or web address] - password [email or web address] [facility]  Office  [phone]  CC: Primary care physician; Golden Pop, MD  Note: This dictation was prepared with Dragon dictation along with smaller phrase technology. Any transcriptional errors that result from this process are unintentional.

## 2015-08-11 NOTE — Clinical Documentation Improvement (Signed)
Internal Medicine  Can a diagnosis be provided for BMI of 40.25, if appropriate for this admission ?  Thank you   Identify Type - morbid obesity, obesity (link the BMI to condition e.g. morbid obesity with BMI of 47), overweight, with alveolar hypoventilation (Pickwickian Syndrome)  Document etiology of - drug induced (specify drug), due to excess calories, familial, endocrine  Other  Clinically Undetermined  Document any associated diagnoses/conditions.   Supporting Information:  bmi 40.25   Please exercise your independent, professional judgment when responding. A specific answer is not anticipated or expected.   Thank You,  [place] [phone]

## 2015-08-11 NOTE — Progress Notes (Signed)
Thoracentesis complete, patient tolerated well. 2400 ml dark tea colored fluid removed.  VSS, B/P 102/47, SATs 98 % on 40 % HFNC. Portable chest xray obtained post.

## 2015-08-11 NOTE — Progress Notes (Signed)
PULMONARY / CRITICAL CARE MEDICINE   Name: Stuart Spence MRN: [id number] DOB: 09/07/1960    ADMISSION DATE:  08/10/2015   CONSULTATION DATE:  08/10/2015  REFERRING MD:  Hospitalist  CHIEF COMPLAINT: lung mass on CT, tachypnea and hypoxia  HISTORY OF PRESENT ILLNESS:  55 YO Caucasian male with a PMH significant for depression, ED, hypertension and gout who presented to teh ED with acute dyspnea. He was initially seen at Dr. Gust Brooms office a week ago and diagnosed with a lung mass. He was due for a biopsy 08/11/15 however, he decided to come to the ED due to worsening dyspnea. His O2 saturation on presentation was ~96% on 4L [place]. He was admitted to the floor but became more tachypneic and hypoxic. CT chest showed a possible neoplastic obstructing right lung mass with complete collapse of the right lung and a large right pleural effusion. PCCM was consulted for further management. Patient is awake and c/o right chest wall pain and persistent dyspnea.    SUBJECTIVE: No acute changes overnight. Still c/o dyspnea and remains on HFNC. Due for US guided thoracentesis this morning.   VITAL SIGNS: BP 123/77 mmHg  Pulse 86  Temp(Src) 98.2 F (36.8 C) (Oral)  Resp 14  Ht '5\' 7"'$  (1.702 m)  Wt 257 lb 0.9 oz (116.6 kg)  BMI 40.25 kg/m2  SpO2 94%  HEMODYNAMICS:    VENTILATOR SETTINGS: Vent Mode:  [-]  FiO2 (%):  [36 %-42 %] 40 %  INTAKE / OUTPUT: I/O last 3 completed shifts: In: -  Out: 725 [Urine:725]  PHYSICAL EXAMINATION: General: mild respiratory distress Neuro: AAO X4, no focal deficits HEENT: /AT, PERRLA, oral mucosa pink, trachea midline Cardiovascular: RRR, S1/S2, no MRG Lungs:  Increased WOB, limited airflow right lung field, worse in the bases, left lung field without wheezing Abdomen: Non-distended, soft, normal bowel sounds Musculoskeletal: +ROM Skin: Warm and dry  LABS:  BMET  Recent Labs Lab 08/10/15 1554 08/11/15 0320  NA 132* 131*  K 3.7 4.2  CL 95* 93*   CO2 27 30  BUN 15 16  CREATININE 1.01 1.13  GLUCOSE 154* 196*    Electrolytes  Recent Labs Lab 08/10/15 1554 08/11/15 0320  CALCIUM 8.6* 8.8*    CBC  Recent Labs Lab 08/10/15 1554 08/11/15 0320  WBC 11.7* 11.1*  HGB 13.1 13.3  HCT 38.9* 38.8*  PLT 372 385    Coag's No results for input(s): APTT, INR in the last 168 hours.  Sepsis Markers No results for input(s): LATICACIDVEN, PROCALCITON, O2SATVEN in the last 168 hours.  ABG No results for input(s): PHART, PCO2ART, PO2ART in the last 168 hours.  Liver Enzymes  Recent Labs Lab 08/10/15 1554  AST 25  ALT 28  ALKPHOS 90  BILITOT 0.7  ALBUMIN 3.2*    Cardiac Enzymes  Recent Labs Lab 08/10/15 1554  TROPONINI <0.03    Glucose  Recent Labs Lab 08/10/15 2305  GLUCAP 186*    Imaging Ct Angio Chest Pe W/cm &/or Wo Cm  08/10/2015  CLINICAL DATA:  55 year old male with shortness of breath. Recent diagnosis of right-sided pulmonary mass of uncertain etiology. EXAM: CT ANGIOGRAPHY CHEST WITH CONTRAST TECHNIQUE: Multidetector CT imaging of the chest was performed using the standard protocol during bolus administration of intravenous contrast. Multiplanar CT image reconstructions and MIPs were obtained to evaluate the vascular anatomy. CONTRAST:  75 cc Isovue 370 COMPARISON:  None. FINDINGS: There is complete collapse of the right lung. There is an ill-defined 4.5 x 4.5 cm  area of hypodensity in the right upper lobe with small areas of air bronchogram. This is incompletely characterized but is concerning for a centrally obstructing mass and likely correspond to the mass seen on the recent CT. There is a large right pleural effusion with extension into the posterior mediastinum. Stop the left lung is clear. There is near complete occlusion of the right upper lobe bronchus as well as occlusion of the right middle and right lower lobe bronchi. The left main bronchus and its branches are patent. The thoracic aorta  appears unremarkable. Evaluation of the pulmonary arteries is limited due to is respiratory motion artifact and suboptimal visualization of the peripheral branches. No definite pulmonary artery embolus identified. There is no cardiomegaly or pericardial effusion. No hilar or mediastinal adenopathy identified. The esophagus and the thyroid gland appear grossly unremarkable. There is no axillary adenopathy. The chest wall soft tissues appear unremarkable. There is mild degenerative changes of the spine. There is a 1.6 x 0.8 cm lytic lesion in the posterior aspect of the right for 3. There is also lytic and expansile lesions in the anterior aspect of the right fourth rib with cortical destruction of the rib. The visualized upper abdomen appears unremarkable. Review of the MIP images confirms the above findings. IMPRESSION: Ill-defined hypodense lesion in the right upper lobe concerning for a centrally obstructing neoplastic process. There is complete occlusion of the right upper, right middle, and right lower lobe bronchi with complete collapse of the right lung. Large right pleural effusion. 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 No CT evidence of pulmonary embolism. Electronically Signed   By: Anner Crete M.D.   On: 08/10/2015 21:41   US Abdomen Limited  08/10/2015  CLINICAL DATA:  55 year old male with abnormal chest x-ray. Subsequent encounter. EXAM: LIMITED ABDOMEN ULTRASOUND FOR ASCITES TECHNIQUE: Limited ultrasound survey for ascites was performed in all four abdominal quadrants. COMPARISON:  08/10/2015 chest x-ray.  No comparison ultrasound. FINDINGS: No ascites visualized. Evaluation limited by patient's habitus and difficulty holding breath. Right-sided pleural effusion. IMPRESSION: No ascites visualized. Evaluation limited by patient's habitus and difficulty holding breath. Right-sided pleural effusion. Electronically Signed   By: Genia Del M.D.   On:  08/10/2015 18:19   Dg Chest Portable 1 View  08/10/2015  CLINICAL DATA:  55 year old male with a history of cold sweats and difficulty breathing. History of potential right lung mass. EXAM: PORTABLE CHEST 1 VIEW COMPARISON:  None. FINDINGS: Complete opacification of the right hemi thorax. No significant leftward shift. Tracheal air stripe maintained. Aeration on the left relatively maintained. No evidence of left-sided pneumothorax, confluent airspace disease, or pleural effusion. IMPRESSION: Complete opacification the right hemi thorax, potentially a combination of pleural effusion, atelectasis, and/ or consolidation. Given history of mass, with no available comparison. Correlation with any outside imaging may be useful if there is concern for malignancy. Signed, Dulcy Fanny. Earleen Newport, DO [facility] Electronically Signed   By: Corrie Mckusick D.O.   On: 08/10/2015 16:56    STUDIES:  none  CULTURES: MRSA screen-negative Blood cultures X 2 05/07>  ANTIBIOTICS: Levaquin  SIGNIFICANT EVENTS: 05/07>Admitted with lung mass, right pleural effusion and right lung collapse  LINES/TUBES: PIVs  DISCUSSION: 55 yo male presenting with possible neoplastic lung mass, total right lung collapse, large right pleural effusion and post-obstruction pneumonia; hypoxic requiring high flow [place].   ASSESSMENT / PLAN:  PULMONARY A: Acute respiratory failure 2/2 occlusive right lung mass  and pleural effusion Post-obstructive pneumonia Right pleural effusion Right lung collapse P:   -Supplemental O2 with HFNC as tolerated -Nebulized bronchodilator -US guided thoracentesis with cultures and cytology planned for this morning -Empiric Abx -Will probably need a diagnostic bronch if pleural fluid is non-diagnostic  CARDIOVASCULAR A:  H/o Hypertension P:  -Continue home BP meds  RENAL A:   Hyponatremia P:   -monitor and replace  lectrolytes  INFECTIOUS A:   Post-obstructive pneumonia P:   -Levaquin -F/U cultures  MS A:   H/O Gout  P:   Continue home meds  NEUROLOGIC A:   Depression ED P:   RASS goal: N/A -Monitor neuro status -Resume home antidepressant   Disposition and family update: Patient's wife and sisters updated at bedside. Will await culture/cytology results from thoracentesis.  Best Practice: Code Status:  Full. Diet: NPO for thora GI prophylaxis:  n/a. VTE prophylaxis:  SCD's / lovenox   Magdalene S. [facility] [place] ANP-BC Pulmonary and Critical Care Medicine [facility] Pager [phone] or [phone]  08/11/2015, 7:29 AM  Pt seen and examined, agree with NP assessment and plan.   Acute respiratory failure, large right pleural effusion with mediastinal shift, I can not appreciate any noticeable mass on the CT scan. Will send for US guided thoracentesis including flow cytometry. It will take 2-3 days for results.  The patient may require repeat thoracentesis before discharge.   -Marda Stalker, M.D.  08/11/2015 [place].  I have personally obtained a history, examined the patient, evaluated laboratory and imaging results, formulated the assessment and plan and placed orders. The Patient requires high complexity decision making for assessment and support, frequent evaluation and titration of therapies, application of advanced monitoring technologies and extensive interpretation of multiple databases. The patient has critical illness that could lead imminently to failure of 1 or more organ systems and requires the highest level of physician preparedness to intervene.  Critical Care Time devoted to patient care services described in this note is 35 minutes and is exclusive of time spent in procedures.

## 2015-08-12 ENCOUNTER — Inpatient Hospital Stay: Payer: No Typology Code available for payment source

## 2015-08-12 ENCOUNTER — Ambulatory Visit: Payer: PRIVATE HEALTH INSURANCE | Admitting: Family Medicine

## 2015-08-12 DIAGNOSIS — J90 Pleural effusion, not elsewhere classified: Secondary | ICD-10-CM | POA: Insufficient documentation

## 2015-08-12 DIAGNOSIS — Z9889 Other specified postprocedural states: Secondary | ICD-10-CM

## 2015-08-12 DIAGNOSIS — R05 Cough: Secondary | ICD-10-CM

## 2015-08-12 DIAGNOSIS — R5381 Other malaise: Secondary | ICD-10-CM

## 2015-08-12 DIAGNOSIS — J910 Malignant pleural effusion: Secondary | ICD-10-CM

## 2015-08-12 DIAGNOSIS — J9819 Other pulmonary collapse: Secondary | ICD-10-CM

## 2015-08-12 DIAGNOSIS — C3411 Malignant neoplasm of upper lobe, right bronchus or lung: Principal | ICD-10-CM

## 2015-08-12 DIAGNOSIS — F3289 Other specified depressive episodes: Secondary | ICD-10-CM

## 2015-08-12 DIAGNOSIS — R5383 Other fatigue: Secondary | ICD-10-CM

## 2015-08-12 DIAGNOSIS — E871 Hypo-osmolality and hyponatremia: Secondary | ICD-10-CM

## 2015-08-12 DIAGNOSIS — C7951 Secondary malignant neoplasm of bone: Secondary | ICD-10-CM

## 2015-08-12 DIAGNOSIS — I10 Essential (primary) hypertension: Secondary | ICD-10-CM

## 2015-08-12 DIAGNOSIS — R093 Abnormal sputum: Secondary | ICD-10-CM

## 2015-08-12 DIAGNOSIS — Z79899 Other long term (current) drug therapy: Secondary | ICD-10-CM

## 2015-08-12 DIAGNOSIS — R918 Other nonspecific abnormal finding of lung field: Secondary | ICD-10-CM

## 2015-08-12 DIAGNOSIS — M549 Dorsalgia, unspecified: Secondary | ICD-10-CM

## 2015-08-12 DIAGNOSIS — R0600 Dyspnea, unspecified: Secondary | ICD-10-CM

## 2015-08-12 DIAGNOSIS — J948 Other specified pleural conditions: Secondary | ICD-10-CM

## 2015-08-12 LAB — GLUCOSE, CAPILLARY
Glucose-Capillary: 165 mg/dL — ABNORMAL HIGH (ref 65–99)
Glucose-Capillary: 136 mg/dL — ABNORMAL HIGH (ref 65–99)
Glucose-Capillary: 122 mg/dL — ABNORMAL HIGH (ref 65–99)
Glucose-Capillary: 111 mg/dL — ABNORMAL HIGH (ref 65–99)

## 2015-08-12 LAB — BASIC METABOLIC PANEL
Anion gap: 8 (ref 5–15)
BUN: 30 mg/dL — AB (ref 6–20)
CO2: 31 mmol/L (ref 22–32)
Calcium: 8.9 mg/dL (ref 8.9–10.3)
Chloride: 93 mmol/L — ABNORMAL LOW (ref 101–111)
Creatinine, Ser: 1.34 mg/dL — ABNORMAL HIGH (ref 0.61–1.24)
GFR calc Af Amer: >60 mL/min (ref >60)
GFR, EST NON AFRICAN AMERICAN: 59 mL/min — AB (ref >60)
Glucose, Bld: 187 mg/dL — ABNORMAL HIGH (ref 65–99)
POTASSIUM: 4.5 mmol/L (ref 3.5–5.1)
SODIUM: 132 mmol/L — AB (ref 135–145)

## 2015-08-12 LAB — BLOOD GAS, ARTERIAL
ACID-BASE EXCESS: 8 mmol/L — AB (ref 0.0–3.0)
Allens test (pass/fail): POSITIVE — AB
Bicarbonate: 32.8 mEq/L — ABNORMAL HIGH (ref 21.0–28.0)
FIO2: 0.45
O2 SAT: 91.1 %
PH ART: 7.47 — AB (ref 7.350–7.450)
Patient temperature: 37
pCO2 arterial: 45 mmHg (ref 32.0–48.0)
pO2, Arterial: 57 mmHg — ABNORMAL LOW (ref 83.0–108.0)

## 2015-08-12 LAB — PROTIME-INR
INR: 1.19
Prothrombin Time: 15.3 seconds — ABNORMAL HIGH (ref 11.4–15.0)

## 2015-08-12 LAB — PROTEIN, TOTAL: TOTAL PROTEIN: 7.1 g/dL (ref 6.5–8.1)

## 2015-08-12 LAB — CBC
HEMATOCRIT: 39.2 % — AB (ref 40.0–52.0)
Hemoglobin: 13.2 g/dL (ref 13.0–18.0)
MCH: 30.6 pg (ref 26.0–34.0)
MCHC: 33.6 g/dL (ref 32.0–36.0)
MCV: 91.1 fL (ref 80.0–100.0)
PLATELETS: 391 10*3/uL (ref 150–440)
RBC: 4.3 MIL/uL — ABNORMAL LOW (ref 4.40–5.90)
RDW: 13.2 % (ref 11.5–14.5)
WBC: 14.4 10*3/uL — AB (ref 3.8–10.6)

## 2015-08-12 LAB — APTT: APTT: 27 s (ref 24–36)

## 2015-08-12 LAB — TRIGLYCERIDES, BODY FLUIDS: Triglycerides, Fluid: 58 mg/dL

## 2015-08-12 LAB — LACTATE DEHYDROGENASE: LDH: 179 U/L (ref 98–192)

## 2015-08-12 MED ORDER — FLUOXETINE HCL 20 MG PO CAPS
40.0000 mg | ORAL_CAPSULE | Freq: Every day | ORAL | Status: DC
  Administered 2015-08-13 – 2015-08-16 (×4): 40 mg via ORAL
  Filled 2015-08-12 (×4): qty 2

## 2015-08-12 MED ORDER — ALUM & MAG HYDROXIDE-SIMETH 200-200-20 MG/5ML PO SUSP
30.0000 mL | ORAL | Status: DC | PRN
  Administered 2015-08-12: 30 mL via ORAL
  Filled 2015-08-12: qty 30

## 2015-08-12 MED ORDER — SENNOSIDES-DOCUSATE SODIUM 8.6-50 MG PO TABS
1.0000 | ORAL_TABLET | Freq: Two times a day (BID) | ORAL | Status: DC
  Administered 2015-08-12 – 2015-08-17 (×9): 1 via ORAL
  Filled 2015-08-12 (×10): qty 1

## 2015-08-12 NOTE — Progress Notes (Addendum)
Patient ID: Stuart Spence, male   DOB: 26-Oct-1960, 55 y.o.   MRN: [id number] [place] [facility] [place] NAME: Stuart Spence    MR#:  [id number]  DATE OF BIRTH:  08/05/1960  SUBJECTIVE:  Admitted with increasing shortness of breath found to have large pleural effusion status post thoracentesis 2.4 L removed. Patient still continues to have high flow nasal cannula oxygen.  REVIEW OF SYSTEMS:   Review of Systems  Constitutional: Negative for fever, chills and weight loss.  HENT: Negative for ear discharge, ear pain and nosebleeds.   Eyes: Negative for blurred vision, pain and discharge.  Respiratory: Positive for shortness of breath. Negative for sputum production, wheezing and stridor.   Cardiovascular: Negative for chest pain, palpitations, orthopnea and PND.  Gastrointestinal: Negative for nausea, vomiting, abdominal pain and diarrhea.  Genitourinary: Negative for urgency and frequency.  Musculoskeletal: Negative for back pain and joint pain.  Neurological: Positive for weakness. Negative for sensory change, speech change and focal weakness.  Psychiatric/Behavioral: Negative for depression and hallucinations. The patient is not nervous/anxious.   All other systems reviewed and are negative.  Tolerating Diet:yes Tolerating PT: yes  DRUG ALLERGIES:  No Known Allergies  VITALS:  Blood pressure 121/72, pulse 99, temperature 98.5 F (36.9 C), temperature source Oral, resp. rate 18, height '5\' 7"'$  (1.702 m), weight 116.6 kg (257 lb 0.9 oz), SpO2 91 %.  PHYSICAL EXAMINATION:   Physical Exam  GENERAL:  55 y.o.-year-old patient lying in the bed with no acute distress.  EYES: Pupils equal, round, reactive to light and accommodation. No scleral icterus. Extraocular muscles intact.  HEENT: Head atraumatic, normocephalic. Oropharynx and nasopharynx clear. High flow nasal cannula oxygen NECK:  Supple, no jugular venous distention. No thyroid  enlargement, no tenderness.  LUNGSDecreased breath sounds bilaterally more on the right than left. breath sounds bilaterally, no wheezing, rales, rhonchi. No use of accessory muscles of respiration.  CARDIOVASCULAR: S1, S2 normal. No murmurs, rubs, or gallops.  ABDOMEN: Soft, nontender, nondistended. Bowel sounds present. No organomegaly or mass.  EXTREMITIES: No cyanosis, clubbing or edema b/l.    NEUROLOGIC: Cranial nerves II through XII are intact. No focal Motor or sensory deficits b/l.   PSYCHIATRIC:  patient is alert and oriented x 3.  SKIN: No obvious rash, lesion, or ulcer.   LABORATORY PANEL:  CBC  Recent Labs Lab 08/12/15 0318  WBC 14.4*  HGB 13.2  HCT 39.2*  PLT 391    Chemistries   Recent Labs Lab 08/10/15 1554  08/12/15 0318  NA 132*  < > 132*  K 3.7  < > 4.5  CL 95*  < > 93*  CO2 27  < > 31  GLUCOSE 154*  < > 187*  BUN 15  < > 30*  CREATININE 1.01  < > 1.34*  CALCIUM 8.6*  < > 8.9  AST 25  --   --   ALT 28  --   --   ALKPHOS 90  --   --   BILITOT 0.7  --   --   < > = values in this interval not displayed. Cardiac Enzymes  Recent Labs Lab 08/10/15 1554  TROPONINI <0.03   RADIOLOGY:  Dg Chest 1 View  08/12/2015  CLINICAL DATA:  Right thoracentesis planned for today; acute respiratory failure with hypoxia. EXAM: CHEST 1 VIEW COMPARISON:  Portable chest x-ray of Aug 11, 2015 FINDINGS: The right lung is nearly totally atelectatic. There is no shift  of the mediastinum. The left lung is well-expanded. The interstitial markings on the left are mildly prominent though stable. There is no alveolar infiltrate. The left heart border is normal. There is no left pleural effusion. IMPRESSION: Near total atelectasis of the right lung with moderate sized pleural effusion surrounding the lung. No significant mediastinal shift. No acute abnormality on the left. Electronically Signed   By: David  Martinique M.D.   On: 08/12/2015 07:55   Ct Chest Wo Contrast  08/12/2015   CLINICAL DATA:  Post thoracentesis EXAM: CT CHEST WITHOUT CONTRAST TECHNIQUE: Multidetector CT imaging of the chest was performed following the standard protocol without IV contrast. COMPARISON:  08/10/2015 FINDINGS: THORACIC INLET/BODY WALL: No acute abnormality. MEDIASTINUM: Normal heart size. Trace fluid or thickening at the cardiac apex. No acute vascular abnormality. No adenopathy. LUNG WINDOWS: 34 mm right upper lobe mass which correlates with hypo enhancing area on previous CT. Mass is along the apical bronchi. This is primarily concerning for malignancy given the lytic right fourth rib lesion with extensive erosion and subpleural extension. There is a smaller mildly expansile lesion in the lateral right fourth rib. There is patchy atelectasis in the right lung with septal thickening and ground-glass opacity attributed to re-expansion edema. The left lung is clear. UPPER ABDOMEN: No signs of metastatic disease. OSSEOUS: Extensive right fourth rib lesion as described above. IMPRESSION: 1. Persistent right upper lobe mass that is primarily concerning for bronchogenic carcinoma. The mass is along the apical segmental bronchi. 2. 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 Primary bone tumor with pleural and parenchymal spread is a diagnostic consideration in this never smoker, correlate with cytology from pleural fluid. 3. Atelectasis and re-expansion edema on the right. Electronically Signed   By: Monte Fantasia M.D.   On: 08/12/2015 10:56   Ct Angio Chest Pe W/cm &/or Wo Cm  08/10/2015  CLINICAL DATA:  55 year old male with shortness of breath. Recent diagnosis of right-sided pulmonary mass of uncertain etiology. EXAM: CT ANGIOGRAPHY CHEST WITH CONTRAST TECHNIQUE: Multidetector CT imaging of the chest was performed using the standard protocol during bolus administration of intravenous contrast. Multiplanar CT image reconstructions and MIPs were obtained to evaluate the vascular anatomy.  CONTRAST:  75 cc Isovue 370 COMPARISON:  None. FINDINGS: There is complete collapse of the right lung. There is an ill-defined 4.5 x 4.5 cm area of hypodensity in the right upper lobe with small areas of air bronchogram. This is incompletely characterized but is concerning for a centrally obstructing mass and likely correspond to the mass seen on the recent CT. There is a large right pleural effusion with extension into the posterior mediastinum. Stop the left lung is clear. There is near complete occlusion of the right upper lobe bronchus as well as occlusion of the right middle and right lower lobe bronchi. The left main bronchus and its branches are patent. The thoracic aorta appears unremarkable. Evaluation of the pulmonary arteries is limited due to is respiratory motion artifact and suboptimal visualization of the peripheral branches. No definite pulmonary artery embolus identified. There is no cardiomegaly or pericardial effusion. No hilar or mediastinal adenopathy identified. The esophagus and the thyroid gland appear grossly unremarkable. There is no axillary adenopathy. The chest wall soft tissues appear unremarkable. There is mild degenerative changes of the spine. There is a 1.6 x 0.8 cm lytic lesion in the posterior aspect of the right for 3. There is also lytic and expansile lesions in the anterior aspect of the right  fourth rib with cortical destruction of the rib. The visualized upper abdomen appears unremarkable. Review of the MIP images confirms the above findings. IMPRESSION: Ill-defined hypodense lesion in the right upper lobe concerning for a centrally obstructing neoplastic process. There is complete occlusion of the right upper, right middle, and right lower lobe bronchi with complete collapse of the right lung. Large right pleural effusion. 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 No CT evidence of pulmonary embolism. Electronically Signed   By:  Anner Crete M.D.   On: 08/10/2015 21:41   US Abdomen Limited  08/10/2015  CLINICAL DATA:  55 year old male with abnormal chest x-ray. Subsequent encounter. EXAM: LIMITED ABDOMEN ULTRASOUND FOR ASCITES TECHNIQUE: Limited ultrasound survey for ascites was performed in all four abdominal quadrants. COMPARISON:  08/10/2015 chest x-ray.  No comparison ultrasound. FINDINGS: No ascites visualized. Evaluation limited by patient's habitus and difficulty holding breath. Right-sided pleural effusion. IMPRESSION: No ascites visualized. Evaluation limited by patient's habitus and difficulty holding breath. Right-sided pleural effusion. Electronically Signed   By: Genia Del M.D.   On: 08/10/2015 18:19   Dg Chest Port 1 View  08/11/2015  CLINICAL DATA:  Post right thoracentesis EXAM: PORTABLE CHEST 1 VIEW COMPARISON:  CT of the chest 08/10/2015 FINDINGS: Again noted complete opacification of the right hemithorax final combination of large right pleural effusion with atelectasis, infiltrate or consolidation. Left lung is clear. There is no pneumothorax. IMPRESSION: Again noted complete opacification of the right hemi thorax probable combination of pleural effusion with infiltrate/consolidation. There is no pneumothorax. Left lung is clear. Electronically Signed   By: Lahoma Crocker M.D.   On: 08/11/2015 14:47   Dg Chest Portable 1 View  08/10/2015  CLINICAL DATA:  55 year old male with a history of cold sweats and difficulty breathing. History of potential right lung mass. EXAM: PORTABLE CHEST 1 VIEW COMPARISON:  None. FINDINGS: Complete opacification of the right hemi thorax. No significant leftward shift. Tracheal air stripe maintained. Aeration on the left relatively maintained. No evidence of left-sided pneumothorax, confluent airspace disease, or pleural effusion. IMPRESSION: Complete opacification the right hemi thorax, potentially a combination of pleural effusion, atelectasis, and/ or consolidation. Given history  of mass, with no available comparison. Correlation with any outside imaging may be useful if there is concern for malignancy. Signed, Dulcy Fanny. Earleen Newport, DO [facility] Electronically Signed   By: Corrie Mckusick D.O.   On: 08/10/2015 16:56   US Thoracentesis Asp Pleural Space W/img Guide  08/12/2015  INDICATION: Right pleural effusion. EXAM: ULTRASOUND GUIDED right THORACENTESIS MEDICATIONS: None. COMPLICATIONS: None immediate. PROCEDURE: An ultrasound guided thoracentesis was thoroughly discussed with the patient and questions answered. The benefits, risks, alternatives and complications were also discussed. The patient understands and wishes to proceed with the procedure. Written consent was obtained. Ultrasound was performed to localize and mark an adequate pocket of fluid in the right chest. The area was then prepped and draped in the normal sterile fashion. 1% Lidocaine was used for local anesthesia. Under ultrasound guidance a Safe-T-Centesis catheter was introduced. Thoracentesis was performed. The catheter was removed and a dressing applied. FINDINGS: A total of approximately 2.3 L of bloody fluid was removed. Samples were sent to the laboratory as requested by the clinical team. IMPRESSION: Successful ultrasound guided right thoracentesis yielding 2.3 L of pleural fluid. Electronically Signed   By: Marijo Conception, M.D.   On: 08/12/2015 10:50   US Thoracentesis Asp Pleural Space W/img  Guide  08/11/2015  INDICATION: Symptomatic right sided pleural effusion. Please perform ultrasound-guided thoracentesis for diagnostic and therapeutic purposes. EXAM: US THORACENTESIS ASP PLEURAL SPACE W/IMG GUIDE COMPARISON:  Chest radiograph - 08/10/2015; chest CTA - 08/10/2015 MEDICATIONS: None. COMPLICATIONS: None immediate. TECHNIQUE: Informed written consent was obtained from the patient after a discussion of the risks, benefits and alternatives to treatment. A  timeout was performed prior to the initiation of the procedure. Initial ultrasound scanning demonstrates a large anechoic right-sided pleural pleural effusion compatible the findings seen on preceding chest CTA. The lower chest was prepped and draped in the usual sterile fashion. 1% lidocaine was used for local anesthesia. An ultrasound image was saved for documentation purposes. An 8 Fr Safe-T-Centesis catheter was introduced. The thoracentesis was performed. The catheter was removed and a dressing was applied. The patient tolerated the procedure well without immediate post procedural complication. The patient was escorted to have an upright chest radiograph. FINDINGS: A total of approximately 2.4 liters of brown colored serous fluid was removed. Requested samples were sent to the laboratory. IMPRESSION: Successful ultrasound-guided right sided thoracentesis yielding 2.4 liters of pleural fluid. PLAN: Completion ultrasound-guided thoracentesis could be performed in the coming 1-2 days following this initial large volume thoracentesis as clinically indicated. Electronically Signed   By: Sandi Mariscal M.D.   On: 08/11/2015 14:56   ASSESSMENT AND PLAN:  Stuart Spence is a 55 y.o. male with a known history of Essential hypertension who is presenting with shortness of breath. He describes having progressive shortness of breath originally dyspnea on exertion but now even at rest for about 1 month. He went to an outside facility about a week ago given symptoms of nonproductive cough and shortness of breath pleuritic chest pain at that time had a chest x-ray and CAT scan performed which revealed a right lung mass.  * Right lung mass with right lung collapse and pleural effusion - acute hypoxic respiratory failure Most likely malignant effusion. Patient is scheduled for a bedside thoracentesis later. Pulmonology consulted and appreciate help. -Thoracentesis right-sided 2 removed 2.4 L and 1 dose respectively -  Presently is on high flow nasal cannula and critically ill---. Wean to nasal cannula as tolerated. -Await fluid cytology results. If inconclusive then patient will need biopsy of the mass. [facility] consult oncology. On Levaquin due to concern for postobstructive pneumonia.  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 Does not seem fluid overloaded. Stop Lasix.  * Hypertension Continue Norvasc and benazepril.  * DVT prophylaxis with Lovenox  Case discussed with Care Management/Social Worker. Management plans discussed with the patient, family and they are in agreement.  CODE STATUS:full  DVT Prophylaxis: lovenox  TOTAL criticalTIME TAKING CARE OF THIS PATIENT:35 minutes.  >50% time spent on counselling and coordination of care  POSSIBLE D/C IN 1-2 DAYS, DEPENDING ON CLINICAL CONDITION.  Note: This dictation was prepared with Dragon dictation along with smaller phrase technology. Any transcriptional errors that result from this process are unintentional.  Shyne Lehrke M.D on 08/12/2015 at 12:57 PM  Between 7am to 6pm - Pager - [phone]  After 6pm go to [email or web address] - password [email or web address] [facility]  Office  [phone]  CC: Primary care physician; Golden Pop, MD

## 2015-08-12 NOTE — Progress Notes (Signed)
Patient ID: Stuart Spence, male   DOB: 08-Dec-1960, 55 y.o.   MRN: [id number]  Chief Complaint  Patient presents with  . Respiratory Distress    Referred By Dr. Delight Hoh Reason for Referral malignant pleural effusion right side  HPI Location, Quality, Duration, Severity, Timing, Context, Modifying Factors, Associated Signs and Symptoms.  Stuart Spence is a 55 y.o. male.  This patient is a 55 year old nonsmoking male who presented with a several week history of increasing shortness of breath and feeling tired and run down. He presented to an outside facility where chest x-ray showed a very large right-sided pleural effusion and a subsequent CT scan confirmed the presence of a large right-sided pleural effusion with destruction of the right fourth rib anteriorly. 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 Over the last 2 days he said 2 thoracenteses with removal of approximately 4 L of pleural fluid which was described as bloody. He states that he feels significantly better after his thoracentesis but still remains with oxygen saturations in the high 80s and low 90s. He has a family history of lung cancer and denies any personal smoking history or asbestos exposure.   Past Medical History  Diagnosis Date  . Depression   . Gout   . ED (erectile dysfunction)   . Hypertension     Past Surgical History  Procedure Laterality Date  . Tonsillectomy      Family History  Problem Relation Age of Onset  . Cancer Mother 58    lung  . Heart attack Father 53  . Hypertension Father   . Congestive Heart Failure Father 18    died from  . Diabetes Sister     Social History Social History  Substance Use Topics  . Smoking status: Never Smoker   . Smokeless tobacco: Never Used  . Alcohol Use: No     Comment: gave it up in August    No Known Allergies  Current  Facility-Administered Medications  Medication Dose Route Frequency Provider Last Rate Last Dose  . acetaminophen (TYLENOL) tablet 650 mg  650 mg Oral Q6H PRN Lytle Butte, MD       Or  . acetaminophen (TYLENOL) suppository 650 mg  650 mg Rectal Q6H PRN Lytle Butte, MD      . alum & mag hydroxide-simeth (MAALOX/MYLANTA) 200-200-20 MG/5ML suspension 30 mL  30 mL Oral Q4H PRN Flora Lipps, MD   30 mL at 08/12/15 1420  . amLODipine (NORVASC) tablet 10 mg  10 mg Oral Daily Lytle Butte, MD   10 mg at 08/12/15 1052  . benazepril (LOTENSIN) tablet 40 mg  40 mg Oral Daily Lytle Butte, MD   40 mg at 08/12/15 1053  . [START ON 08/13/2015] FLUoxetine (PROZAC) capsule 40 mg  40 mg Oral Daily Evlyn Kanner, NP      . insulin aspart (novoLOG) injection 0-15 Units  0-15 Units Subcutaneous TID WC Laverle Hobby, MD   2 Units at 08/12/15 1652  . insulin aspart (novoLOG) injection 0-5 Units  0-5 Units Subcutaneous QHS Laverle Hobby, MD   0 Units at 08/11/15 2146  . ipratropium-albuterol (DUONEB) 0.5-2.5 (3) MG/3ML nebulizer solution 3 mL  3 mL Nebulization Q4H Lytle Butte, MD   3 mL at 08/12/15 1522  . labetalol (NORMODYNE,TRANDATE) injection 10 mg  10 mg Intravenous Q2H PRN Lance Coon, MD  10 mg at 08/10/15 2329  . levofloxacin (LEVAQUIN) IVPB 750 mg  750 mg Intravenous Q24H Srikar Sudini, MD   750 mg at 08/12/15 1052  . methylPREDNISolone sodium succinate (SOLU-MEDROL) 40 mg/mL injection 40 mg  40 mg Intravenous Q24H Lytle Butte, MD   40 mg at 08/11/15 2106  . morphine 2 MG/ML injection 2 mg  2 mg Intravenous Q4H PRN Lytle Butte, MD   2 mg at 08/10/15 2229  . ondansetron (ZOFRAN) tablet 4 mg  4 mg Oral Q6H PRN Lytle Butte, MD       Or  . ondansetron [facility]) injection 4 mg  4 mg Intravenous Q6H PRN Lytle Butte, MD      . oxyCODONE (Oxy IR/ROXICODONE) immediate release tablet 5 mg  5 mg Oral Q4H PRN Lytle Butte, MD   5 mg at 08/10/15 1849  . senna-docusate (Senokot-S) tablet 1  tablet  1 tablet Oral BID Fritzi Mandes, MD   1 tablet at 08/12/15 1052      Review of Systems A complete review of systems was asked and was negative except for the following positive findingsIncreasing shortness of breath, increasing abdominal girth, swelling of his lower extremities.  Blood pressure 114/63, pulse 103, temperature 99.8 F (37.7 C), temperature source Oral, resp. rate 19, height '5\' 7"'$  (1.702 m), weight 240 lb 4.8 oz (109 kg), SpO2 93 %.  Physical Exam CONSTITUTIONAL:  Pleasant, well-developed, well-nourished, and in no acute distress. EYES: Pupils equal and reactive to light, Sclera non-icteric EARS, NOSE, MOUTH AND THROAT:  The oropharynx was clear.  Dentition is good repair.  Oral mucosa pink and moist. LYMPH NODES:  Lymph nodes in the neck and axillae were normal RESPIRATORY:  Lungs were clear on the left but diminished on the right..  Normal respiratory effort without pathologic use of accessory muscles of respiration CARDIOVASCULAR: Heart was regular without murmurs.  There were no carotid bruits. GI: The abdomen was soft, nontender, and nondistended. There were no palpable masses. There was no hepatosplenomegaly. There were normal bowel sounds in all quadrants. GU:  Rectal deferred.   MUSCULOSKELETAL:  Normal muscle strength and tone.  No clubbing or cyanosis.   SKIN:  There were no pathologic skin lesions.  There were no nodules on palpation. NEUROLOGIC:  Sensation is normal.  Cranial nerves are grossly intact. PSYCH:  Oriented to person, place and time.  Mood and affect are normal.  Data Reviewed Chest x-rays and CT scans  I have personally reviewed the patient's imaging, laboratory findings and medical records.    Assessment    I have independently reviewed the patient's chest x-rays and CT scans. There is an extensive right-sided pleural effusion which has now been drained. There is some pleural thickening and pleural nodularity as well as a right upper lobe  mass and destruction of the fourth rib. 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 I had a long discussion with he and his wife and other family members today about the options for management. I've been asked to see the patient for insertion of Port-A-Cath and also for a Pleurx catheter or thoracoscopy with talc pleurodesis. I spent a long time reviewing with the patient and his family the indications and risks of these procedures as well as the advantages and disadvantages. After an extensive discussion the patient would like to proceed with Port-A-Cath insertion and Pleurx catheter insertion. He understands the risks involved as well as the need for continued maintenance of  these catheters.    Plan    I had a long discussion today with Dr. Louie Bun who is in agreement with the above plan. In addition the patient had QUESTIONS answered. We will proceed with insertion of the Port-A-Cath and Pleurx catheter soon as possible.       Nestor Lewandowsky, MD 08/12/2015, 7:50 PM

## 2015-08-12 NOTE — Progress Notes (Signed)
Report given to [facility], RN taking over care of pt.  Pt is alert an oriented, no complaints of pain.  NSR, lungs clear, diminished on the right.  Tolerating diet.  Pt had thoracentesis performed this morning, no bleeding or complications from site.  VSS, afebrile.

## 2015-08-12 NOTE — Consult Note (Addendum)
Stuart Spence is an 55 y.o. male.   Chief Complaint  Patient presents with  . Respiratory Distress   HPI:  Patient is seen for consultation regarding a new right lung mass. He was admitted through the ED with progressive sob over the past 1 month or so. He reports feeling improved from original time of admission, he is currently on high flow O2. States that he went to urgent care a week ago and had a chest xray that showed a lung mass. He has had thoracentesis x 2 with ~5L of fluid removed. Preliminary pathology significant for adenocarcinoma. CT scan also notes involvement of the right fourth rib, likely metastatic disease. Having some mild depression due to current acute illness.  Past Medical History: Past Medical History  Diagnosis Date  . Depression   . Gout   . ED (erectile dysfunction)   . Hypertension     Past Surgical History: Past Surgical History  Procedure Laterality Date  . Tonsillectomy      Family History: Family History  Problem Relation Age of Onset  . Cancer Mother 35    lung  . Heart attack Father 71  . Hypertension Father   . Congestive Heart Failure Father 91    died from  . Diabetes Sister     Social History:  reports that he has never smoked. He has never used smokeless tobacco. He reports that he does not drink alcohol or use illicit drugs.  Allergies: No Known Allergies  Medications Prior to Admission  Medication Sig Dispense Refill  . albuterol (PROVENTIL HFA;VENTOLIN HFA) 108 (90 Base) MCG/ACT inhaler Inhale 1-2 puffs into the lungs See admin instructions. Inhale 1 to 2 puff by mouth every 4 to 6 hours as needed for difficulty breathing.    Marland Kitchen amLODipine (NORVASC) 10 MG tablet Take 1 tablet (10 mg total) by mouth daily. 30 tablet 6  . benazepril (LOTENSIN) 40 MG tablet Take 1 tablet (40 mg total) by mouth daily. 30 tablet 6  . colchicine 0.6 MG tablet Take 0.6 mg by mouth daily as needed (two pills by mouth at onset, the one pill one hour later,  maximum three pills per gout flare).    Marland Kitchen FLUoxetine (PROZAC) 20 MG capsule Take 1 capsule (20 mg total) by mouth daily. 30 capsule 6  . HYDROcodone-acetaminophen (NORCO/VICODIN) 5-325 MG tablet Take 1 tablet by mouth every 6 (six) hours.    . ondansetron (ZOFRAN) 4 MG tablet Take 4 mg by mouth every 6 (six) hours as needed for nausea or vomiting.    . sildenafil (VIAGRA) 100 MG tablet Take 1 tablet (100 mg total) by mouth daily as needed for erectile dysfunction. 10 tablet 12    Results for orders placed or performed during the hospital encounter of 08/10/15 (from the past 48 hour(s))  Sodium, urine, random     Status: None   Collection Time: 08/10/15  9:32 PM  Result Value Ref Range   Sodium, Ur 77 mmol/L  Osmolality, urine     Status: Abnormal   Collection Time: 08/10/15  9:32 PM  Result Value Ref Range   Osmolality, Ur 253 (L) 300 - 900 mOsm/kg  Glucose, capillary     Status: Abnormal   Collection Time: 08/10/15 11:05 PM  Result Value Ref Range   Glucose-Capillary 186 (H) 65 - 99 mg/dL  MRSA PCR Screening     Status: None   Collection Time: 08/10/15 11:06 PM  Result Value Ref Range   MRSA by PCR  NEGATIVE NEGATIVE    Comment:        The GeneXpert MRSA Assay (FDA approved for NASAL specimens only), is one component of a comprehensive MRSA colonization surveillance program. It is not intended to diagnose MRSA infection nor to guide or monitor treatment for MRSA infections.   Basic metabolic panel     Status: Abnormal   Collection Time: 08/11/15  3:20 AM  Result Value Ref Range   Sodium 131 (L) 135 - 145 mmol/L   Potassium 4.2 3.5 - 5.1 mmol/L   Chloride 93 (L) 101 - 111 mmol/L   CO2 30 22 - 32 mmol/L   Glucose, Bld 196 (H) 65 - 99 mg/dL   BUN 16 6 - 20 mg/dL   Creatinine, Ser 1.13 0.61 - 1.24 mg/dL   Calcium 8.8 (L) 8.9 - 10.3 mg/dL   GFR calc non Af Amer >60 >60 mL/min   GFR calc Af Amer >60 >60 mL/min    Comment: (NOTE) The eGFR has been calculated using the CKD  EPI equation. This calculation has not been validated in all clinical situations. eGFR's persistently <60 mL/min signify possible Chronic Kidney Disease.    Anion gap 8 5 - 15  CBC     Status: Abnormal   Collection Time: 08/11/15  3:20 AM  Result Value Ref Range   WBC 11.1 (H) 3.8 - 10.6 K/uL   RBC 4.28 (L) 4.40 - 5.90 MIL/uL   Hemoglobin 13.3 13.0 - 18.0 g/dL   HCT 38.8 (L) 40.0 - 52.0 %   MCV 90.6 80.0 - 100.0 fL   MCH 31.0 26.0 - 34.0 pg   MCHC 34.2 32.0 - 36.0 g/dL   RDW 13.1 11.5 - 14.5 %   Platelets 385 150 - 440 K/uL  Lactate dehydrogenase     Status: None   Collection Time: 08/11/15 11:06 AM  Result Value Ref Range   LDH 192 98 - 192 U/L  Albumin     Status: Abnormal   Collection Time: 08/11/15 11:06 AM  Result Value Ref Range   Albumin 3.2 (L) 3.5 - 5.0 g/dL  Glucose, capillary     Status: Abnormal   Collection Time: 08/11/15 12:55 PM  Result Value Ref Range   Glucose-Capillary 116 (H) 65 - 99 mg/dL  Lactate dehydrogenase (CSF, pleural or peritoneal fluid)     Status: Abnormal   Collection Time: 08/11/15  2:02 PM  Result Value Ref Range   LD, Fluid 1570 (H) 3 - 23 U/L    Comment: (NOTE) Results should be evaluated in conjunction with serum values    Fluid Type-FLDH CYTOPLEU   Protein, pleural or peritoneal fluid     Status: None   Collection Time: 08/11/15  2:02 PM  Result Value Ref Range   Total protein, fluid 5.0 g/dL    Comment: (NOTE) No normal range established for this test Results should be evaluated in conjunction with serum values    Fluid Type-FTP PLEURAL     Comment: CORRECTED ON 05/08 AT 1502: PREVIOUSLY REPORTED AS CYTOPLEU  Triglycerides, Body Fluid     Status: None   Collection Time: 08/11/15  2:02 PM  Result Value Ref Range   Triglycerides, Fluid 58 mg/dL    Comment: (NOTE) ________________________________________________________ :  Peritoneal  :       Pleural          :   Synovial      : :______________:________________________:________________: :              :  Transudate :  Exudate  :                : :______________:____________:___________:________________: :  Not Estab.  : Not Estab. : Not Estab.:   Not Estab.   : :______________:____________:___________:________________: The method performance specifications have not been established for this test in body fluid. The test result should be integrated into the clinical context for interpretation. The reference intervals and other method performance specifications have not been established for this test. The test result should be integrated into the clinical context for interpretation. Performed At: [facility] [place], [place] [id number] Lindon Romp MD JH:[id number]    Fluid Type-FTRIG CYTOPLEU   Body fluid cell count with differential     Status: Abnormal   Collection Time: 08/11/15  2:02 PM  Result Value Ref Range   Fluid Type-FCT PLEURAL     Comment: CORRECTED ON 05/08 AT 1502: PREVIOUSLY REPORTED AS CYTOPLEU   Color, Fluid PINK (A) YELLOW   Appearance, Fluid CLOUDY (A) CLEAR   WBC, Fluid 823 cu mm   Neutrophil Count, Fluid 32 %   Lymphs, Fluid 55 %   Monocyte-Macrophage-Serous Fluid 13 %   Eos, Fluid 0 %   Other Cells, Fluid 0 %  Glucose, pleural or peritoneal fluid     Status: None   Collection Time: 08/11/15  2:02 PM  Result Value Ref Range   Glucose, Fluid 90 mg/dL    Comment: (NOTE) No normal range established for this test Results should be evaluated in conjunction with serum values    Fluid Type-FGLU PLEURAL     Comment: CORRECTED ON 05/08 AT 1503: PREVIOUSLY REPORTED AS CYTOPLEU  Amylase     Status: None   Collection Time: 08/11/15  2:47 PM  Result Value Ref Range   Amylase 50 28 - 100 U/L  Glucose, capillary     Status: Abnormal   Collection Time: 08/11/15  4:02 PM  Result Value Ref Range   Glucose-Capillary 107 (H) 65 - 99 mg/dL  Glucose, capillary      Status: Abnormal   Collection Time: 08/11/15  9:42 PM  Result Value Ref Range   Glucose-Capillary 123 (H) 65 - 99 mg/dL  Lactate dehydrogenase     Status: None   Collection Time: 08/12/15  3:18 AM  Result Value Ref Range   LDH 179 98 - 192 U/L  Protein, total     Status: None   Collection Time: 08/12/15  3:18 AM  Result Value Ref Range   Total Protein 7.1 6.5 - 8.1 g/dL  Basic metabolic panel     Status: Abnormal   Collection Time: 08/12/15  3:18 AM  Result Value Ref Range   Sodium 132 (L) 135 - 145 mmol/L   Potassium 4.5 3.5 - 5.1 mmol/L   Chloride 93 (L) 101 - 111 mmol/L   CO2 31 22 - 32 mmol/L   Glucose, Bld 187 (H) 65 - 99 mg/dL   BUN 30 (H) 6 - 20 mg/dL   Creatinine, Ser 1.34 (H) 0.61 - 1.24 mg/dL   Calcium 8.9 8.9 - 10.3 mg/dL   GFR calc non Af Amer 59 (L) >60 mL/min   GFR calc Af Amer >60 >60 mL/min    Comment: (NOTE) The eGFR has been calculated using the CKD EPI equation. This calculation has not been validated in all clinical situations. eGFR's persistently <60 mL/min signify possible Chronic Kidney Disease.    Anion gap 8 5 - 15  CBC  Status: Abnormal   Collection Time: 08/12/15  3:18 AM  Result Value Ref Range   WBC 14.4 (H) 3.8 - 10.6 K/uL   RBC 4.30 (L) 4.40 - 5.90 MIL/uL   Hemoglobin 13.2 13.0 - 18.0 g/dL   HCT 39.2 (L) 40.0 - 52.0 %   MCV 91.1 80.0 - 100.0 fL   MCH 30.6 26.0 - 34.0 pg   MCHC 33.6 32.0 - 36.0 g/dL   RDW 13.2 11.5 - 14.5 %   Platelets 391 150 - 440 K/uL  Blood gas, arterial     Status: Abnormal   Collection Time: 08/12/15  4:08 AM  Result Value Ref Range   FIO2 0.45    Delivery systems HI FLOW NASAL CANNULA    pH, Arterial 7.47 (H) 7.350 - 7.450   pCO2 arterial 45 32.0 - 48.0 mmHg   pO2, Arterial 57 (L) 83.0 - 108.0 mmHg   Bicarbonate 32.8 (H) 21.0 - 28.0 mEq/L   Acid-Base Excess 8.0 (H) 0.0 - 3.0 mmol/L   O2 Saturation 91.1 %   Patient temperature 37.0    Collection site RIGHT RADIAL    Sample type ARTERIAL DRAW    Allens  test (pass/fail) POSITIVE (A) PASS  Glucose, capillary     Status: Abnormal   Collection Time: 08/12/15  7:51 AM  Result Value Ref Range   Glucose-Capillary 165 (H) 65 - 99 mg/dL  Glucose, capillary     Status: Abnormal   Collection Time: 08/12/15 11:18 AM  Result Value Ref Range   Glucose-Capillary 136 (H) 65 - 99 mg/dL  Glucose, capillary     Status: Abnormal   Collection Time: 08/12/15  4:21 PM  Result Value Ref Range   Glucose-Capillary 122 (H) 65 - 99 mg/dL  Protime-INR     Status: Abnormal   Collection Time: 08/12/15  4:44 PM  Result Value Ref Range   Prothrombin Time 15.3 (H) 11.4 - 15.0 seconds   INR 1.19   APTT     Status: None   Collection Time: 08/12/15  4:44 PM  Result Value Ref Range   aPTT 27 24 - 36 seconds   Dg Chest 1 View  08/12/2015  CLINICAL DATA:  Right thoracentesis planned for today; acute respiratory failure with hypoxia. EXAM: CHEST 1 VIEW COMPARISON:  Portable chest x-ray of Aug 11, 2015 FINDINGS: The right lung is nearly totally atelectatic. There is no shift of the mediastinum. The left lung is well-expanded. The interstitial markings on the left are mildly prominent though stable. There is no alveolar infiltrate. The left heart border is normal. There is no left pleural effusion. IMPRESSION: Near total atelectasis of the right lung with moderate sized pleural effusion surrounding the lung. No significant mediastinal shift. No acute abnormality on the left. Electronically Signed   By: David  Martinique M.D.   On: 08/12/2015 07:55   Ct Chest Wo Contrast  08/12/2015  CLINICAL DATA:  Post thoracentesis EXAM: CT CHEST WITHOUT CONTRAST TECHNIQUE: Multidetector CT imaging of the chest was performed following the standard protocol without IV contrast. COMPARISON:  08/10/2015 FINDINGS: THORACIC INLET/BODY WALL: No acute abnormality. MEDIASTINUM: Normal heart size. Trace fluid or thickening at the cardiac apex. No acute vascular abnormality. No adenopathy. LUNG WINDOWS: 34 mm  right upper lobe mass which correlates with hypo enhancing area on previous CT. Mass is along the apical bronchi. This is primarily concerning for malignancy given the lytic right fourth rib lesion with extensive erosion and subpleural extension. There is a smaller mildly  expansile lesion in the lateral right fourth rib. There is patchy atelectasis in the right lung with septal thickening and ground-glass opacity attributed to re-expansion edema. The left lung is clear. UPPER ABDOMEN: No signs of metastatic disease. OSSEOUS: Extensive right fourth rib lesion as described above. IMPRESSION: 1. Persistent right upper lobe mass that is primarily concerning for bronchogenic carcinoma. The mass is along the apical segmental bronchi. 2. 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 Primary bone tumor with pleural and parenchymal spread is a diagnostic consideration in this never smoker, correlate with cytology from pleural fluid. 3. Atelectasis and re-expansion edema on the right. Electronically Signed   By: Monte Fantasia M.D.   On: 08/12/2015 10:56   Ct Angio Chest Pe W/cm &/or Wo Cm  08/10/2015  CLINICAL DATA:  55 year old male with shortness of breath. Recent diagnosis of right-sided pulmonary mass of uncertain etiology. EXAM: CT ANGIOGRAPHY CHEST WITH CONTRAST TECHNIQUE: Multidetector CT imaging of the chest was performed using the standard protocol during bolus administration of intravenous contrast. Multiplanar CT image reconstructions and MIPs were obtained to evaluate the vascular anatomy. CONTRAST:  75 cc Isovue 370 COMPARISON:  None. FINDINGS: There is complete collapse of the right lung. There is an ill-defined 4.5 x 4.5 cm area of hypodensity in the right upper lobe with small areas of air bronchogram. This is incompletely characterized but is concerning for a centrally obstructing mass and likely correspond to the mass seen on the recent CT. There is a large right pleural effusion with  extension into the posterior mediastinum. Stop the left lung is clear. There is near complete occlusion of the right upper lobe bronchus as well as occlusion of the right middle and right lower lobe bronchi. The left main bronchus and its branches are patent. The thoracic aorta appears unremarkable. Evaluation of the pulmonary arteries is limited due to is respiratory motion artifact and suboptimal visualization of the peripheral branches. No definite pulmonary artery embolus identified. There is no cardiomegaly or pericardial effusion. No hilar or mediastinal adenopathy identified. The esophagus and the thyroid gland appear grossly unremarkable. There is no axillary adenopathy. The chest wall soft tissues appear unremarkable. There is mild degenerative changes of the spine. There is a 1.6 x 0.8 cm lytic lesion in the posterior aspect of the right for 3. There is also lytic and expansile lesions in the anterior aspect of the right fourth rib with cortical destruction of the rib. The visualized upper abdomen appears unremarkable. Review of the MIP images confirms the above findings. IMPRESSION: Ill-defined hypodense lesion in the right upper lobe concerning for a centrally obstructing neoplastic process. There is complete occlusion of the right upper, right middle, and right lower lobe bronchi with complete collapse of the right lung. Large right pleural effusion. 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 No CT evidence of pulmonary embolism. Electronically Signed   By: Anner Crete M.D.   On: 08/10/2015 21:41   US Abdomen Limited  08/10/2015  CLINICAL DATA:  55 year old male with abnormal chest x-ray. Subsequent encounter. EXAM: LIMITED ABDOMEN ULTRASOUND FOR ASCITES TECHNIQUE: Limited ultrasound survey for ascites was performed in all four abdominal quadrants. COMPARISON:  08/10/2015 chest x-ray.  No comparison ultrasound. FINDINGS: No ascites visualized. Evaluation  limited by patient's habitus and difficulty holding breath. Right-sided pleural effusion. IMPRESSION: No ascites visualized. Evaluation limited by patient's habitus and difficulty holding breath. Right-sided pleural effusion. Electronically Signed   By: Alcide Evener.D.  On: 08/10/2015 18:19   Dg Chest Port 1 View  08/11/2015  CLINICAL DATA:  Post right thoracentesis EXAM: PORTABLE CHEST 1 VIEW COMPARISON:  CT of the chest 08/10/2015 FINDINGS: Again noted complete opacification of the right hemithorax final combination of large right pleural effusion with atelectasis, infiltrate or consolidation. Left lung is clear. There is no pneumothorax. IMPRESSION: Again noted complete opacification of the right hemi thorax probable combination of pleural effusion with infiltrate/consolidation. There is no pneumothorax. Left lung is clear. Electronically Signed   By: Lahoma Crocker M.D.   On: 08/11/2015 14:47   US Thoracentesis Asp Pleural Space W/img Guide  08/12/2015  INDICATION: Right pleural effusion. EXAM: ULTRASOUND GUIDED right THORACENTESIS MEDICATIONS: None. COMPLICATIONS: None immediate. PROCEDURE: An ultrasound guided thoracentesis was thoroughly discussed with the patient and questions answered. The benefits, risks, alternatives and complications were also discussed. The patient understands and wishes to proceed with the procedure. Written consent was obtained. Ultrasound was performed to localize and mark an adequate pocket of fluid in the right chest. The area was then prepped and draped in the normal sterile fashion. 1% Lidocaine was used for local anesthesia. Under ultrasound guidance a Safe-T-Centesis catheter was introduced. Thoracentesis was performed. The catheter was removed and a dressing applied. FINDINGS: A total of approximately 2.3 L of bloody fluid was removed. Samples were sent to the laboratory as requested by the clinical team. IMPRESSION: Successful ultrasound guided right thoracentesis  yielding 2.3 L of pleural fluid. Electronically Signed   By: Marijo Conception, M.D.   On: 08/12/2015 10:50   US Thoracentesis Asp Pleural Space W/img Guide  08/11/2015  INDICATION: Symptomatic right sided pleural effusion. Please perform ultrasound-guided thoracentesis for diagnostic and therapeutic purposes. EXAM: US THORACENTESIS ASP PLEURAL SPACE W/IMG GUIDE COMPARISON:  Chest radiograph - 08/10/2015; chest CTA - 08/10/2015 MEDICATIONS: None. COMPLICATIONS: None immediate. TECHNIQUE: Informed written consent was obtained from the patient after a discussion of the risks, benefits and alternatives to treatment. A timeout was performed prior to the initiation of the procedure. Initial ultrasound scanning demonstrates a large anechoic right-sided pleural pleural effusion compatible the findings seen on preceding chest CTA. The lower chest was prepped and draped in the usual sterile fashion. 1% lidocaine was used for local anesthesia. An ultrasound image was saved for documentation purposes. An 8 Fr Safe-T-Centesis catheter was introduced. The thoracentesis was performed. The catheter was removed and a dressing was applied. The patient tolerated the procedure well without immediate post procedural complication. The patient was escorted to have an upright chest radiograph. FINDINGS: A total of approximately 2.4 liters of brown colored serous fluid was removed. Requested samples were sent to the laboratory. IMPRESSION: Successful ultrasound-guided right sided thoracentesis yielding 2.4 liters of pleural fluid. PLAN: Completion ultrasound-guided thoracentesis could be performed in the coming 1-2 days following this initial large volume thoracentesis as clinically indicated. Electronically Signed   By: Sandi Mariscal M.D.   On: 08/11/2015 14:56    ROS: Review of Systems  Constitutional: Positive for malaise/fatigue. Negative for fever, chills, weight loss and diaphoresis.  HENT: Negative.   Eyes: Negative.    Respiratory: Positive for cough, sputum production and shortness of breath. Negative for hemoptysis and wheezing.   Cardiovascular: Negative for chest pain, palpitations, orthopnea, claudication, leg swelling and PND.  Gastrointestinal: Negative for heartburn, nausea, vomiting, abdominal pain, diarrhea, constipation, blood in stool and melena.  Genitourinary: Negative.   Musculoskeletal: Positive for back pain.  Skin: Negative.   Neurological: Negative for dizziness, tingling, focal  weakness, seizures and weakness.  Endo/Heme/Allergies: Does not bruise/bleed easily.  Psychiatric/Behavioral: Negative for depression. The patient is not nervous/anxious and does not have insomnia.     Vital Signs: Blood pressure 109/61, pulse 101, temperature 98.7 F (37.1 C), temperature source Oral, resp. rate 27, height _0  (1.702 m), weight 240 lb 4.8 oz (109 kg), SpO2 90 %.  Physical Exam: Physical Exam  Constitutional: He is oriented to person, place, and time. He appears well-developed and well-nourished.  HENT:  Head: Normocephalic and atraumatic.  Eyes: Conjunctivae are normal. Pupils are equal, round, and reactive to light.  Cardiovascular: Normal rate, regular rhythm and normal heart sounds.   Respiratory: He has no wheezes. He has no rales.  Mild respiratory distress, Right side diminished air entry  GI: Soft. Bowel sounds are normal.  Musculoskeletal: Normal range of motion.  Neurological: He is alert and oriented to person, place, and time.  Skin: Skin is warm and dry.  Psychiatric: He has a normal mood and affect. His behavior is normal. Judgment and thought content normal.     Assessment/Plan 1. Right Upper lobe lesion. On CT there is an Ill-defined hypodense lesion in the right upper lobe concerning for a centrally obstructing neoplastic process. There is complete occlusion of the right upper, right middle, and right lower lobe bronchi with complete collapse of the right lung; Large  right pleural effusion; Lytic lesions involving the right fourth rib most compatible with metastatic disease. Currently on high flow nasal canula and actively trying to wean him to nasal canula. Thoracentesis has been performed x2 with ~5L of pleural fluid, cytology with a preliminary report of adenocarcinoma.  Discussed results with Dr. Grayland Ormond and Dr. Genevive Bi. Plan to insert pleurx catheter as well as placement of PAC as patient will require chemotherapy in the outpatient setting. Patient will also require further imaging for complete staging which can also be performed as outpatient.  Dr. Genevive Bi to arrange for [facility] and pleurx catheter. 2. Depression. Patient has been on prozac 17m for lengthy period of time. Will increase to 445mdaily. 3. Hyponatremia. 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 Asymptomatic at this time. Will continue to monitor.   Will continue to follow patient during hospital stay.  Patient will be followed after discharge with Dr. FiGrayland OrmondThis has been discussed with the patient and his family as well as Dr. FiGrayland Ormond   LeEvlyn KannerAGNP-C 08/12/2015, 5:51 PM

## 2015-08-12 NOTE — Procedures (Signed)
Under US guidance, thoracentesis of right pleural effusion was performed. 2.3 L of bloody fluid obtained and sent to lab. No immediate complications.

## 2015-08-12 NOTE — Progress Notes (Signed)
Pt complained earlier of indigestion/heart burn and was given Maalox that was effective. Family at bedside as Dr. Mortimer Fries spoke with patient and family about pt's condition. Dr. Genevive Bi also in room to speak with patient about performing a bronchoscopy with porta cath  and pleurx catheter on Wednesday. Consent obtained.

## 2015-08-13 ENCOUNTER — Encounter: Payer: Self-pay | Admitting: Anesthesiology

## 2015-08-13 ENCOUNTER — Inpatient Hospital Stay: Payer: No Typology Code available for payment source | Admitting: Anesthesiology

## 2015-08-13 ENCOUNTER — Encounter
Admission: EM | Disposition: A | Discharge status: Home Health | Payer: Self-pay | Source: Home / Self Care | Attending: Internal Medicine

## 2015-08-13 ENCOUNTER — Inpatient Hospital Stay: Payer: No Typology Code available for payment source

## 2015-08-13 DIAGNOSIS — R0600 Dyspnea, unspecified: Secondary | ICD-10-CM

## 2015-08-13 HISTORY — PX: PORTACATH PLACEMENT: SHX2246

## 2015-08-13 HISTORY — PX: CHEST TUBE INSERTION: SHX231

## 2015-08-13 LAB — PROCALCITONIN

## 2015-08-13 LAB — BASIC METABOLIC PANEL
ANION GAP: 12 (ref 5–15)
BUN: 36 mg/dL — ABNORMAL HIGH (ref 6–20)
CHLORIDE: 95 mmol/L — AB (ref 101–111)
CO2: 26 mmol/L (ref 22–32)
Calcium: 8.6 mg/dL — ABNORMAL LOW (ref 8.9–10.3)
Creatinine, Ser: 1.18 mg/dL (ref 0.61–1.24)
GFR calc non Af Amer: >60 mL/min (ref >60)
GLUCOSE: 164 mg/dL — AB (ref 65–99)
Potassium: 4.5 mmol/L (ref 3.5–5.1)
Sodium: 133 mmol/L — ABNORMAL LOW (ref 135–145)

## 2015-08-13 LAB — COMP PANEL: LEUKEMIA/LYMPHOMA

## 2015-08-13 LAB — CBC
HEMATOCRIT: 39.2 % — AB (ref 40.0–52.0)
Hemoglobin: 13.2 g/dL (ref 13.0–18.0)
MCH: 30.2 pg (ref 26.0–34.0)
MCHC: 33.7 g/dL (ref 32.0–36.0)
MCV: 89.8 fL (ref 80.0–100.0)
Platelets: 377 10*3/uL (ref 150–440)
RBC: 4.37 MIL/uL — ABNORMAL LOW (ref 4.40–5.90)
RDW: 13 % (ref 11.5–14.5)
WBC: 15.6 10*3/uL — ABNORMAL HIGH (ref 3.8–10.6)

## 2015-08-13 LAB — GLUCOSE, CAPILLARY
GLUCOSE-CAPILLARY: 147 mg/dL — AB (ref 65–99)
GLUCOSE-CAPILLARY: 226 mg/dL — AB (ref 65–99)
Glucose-Capillary: 173 mg/dL — ABNORMAL HIGH (ref 65–99)
Glucose-Capillary: 126 mg/dL — ABNORMAL HIGH (ref 65–99)

## 2015-08-13 LAB — CYTOLOGY - NON PAP

## 2015-08-13 SURGERY — INSERTION, TUNNELED CENTRAL VENOUS DEVICE, WITH PORT
Anesthesia: General | Laterality: Left | Wound class: Clean

## 2015-08-13 MED ORDER — ALBUMIN HUMAN 5 % IV SOLN
INTRAVENOUS | Status: AC
  Administered 2015-08-13: 15:00:00
  Filled 2015-08-13: qty 250

## 2015-08-13 MED ORDER — PROPOFOL 10 MG/ML IV BOLUS
INTRAVENOUS | Status: DC | PRN
  Administered 2015-08-13: 200 mg via INTRAVENOUS

## 2015-08-13 MED ORDER — ALBUMIN HUMAN 5 % IV SOLN
INTRAVENOUS | Status: DC | PRN
  Administered 2015-08-13 (×2): via INTRAVENOUS

## 2015-08-13 MED ORDER — OXYCODONE HCL 5 MG/5ML PO SOLN: 5.0000 mg | Freq: Once | ORAL | Status: DC | PRN

## 2015-08-13 MED ORDER — SUGAMMADEX SODIUM 200 MG/2ML IV SOLN
INTRAVENOUS | Status: DC | PRN
  Administered 2015-08-13: 218 mg via INTRAVENOUS

## 2015-08-13 MED ORDER — SUCCINYLCHOLINE CHLORIDE 20 MG/ML IJ SOLN
INTRAMUSCULAR | Status: DC | PRN
  Administered 2015-08-13: 120 mg via INTRAVENOUS

## 2015-08-13 MED ORDER — IPRATROPIUM-ALBUTEROL 0.5-2.5 (3) MG/3ML IN SOLN
3.0000 mL | Freq: Once | RESPIRATORY_TRACT | Status: AC
  Administered 2015-08-13: 3 mL via RESPIRATORY_TRACT

## 2015-08-13 MED ORDER — FENTANYL CITRATE (PF) 100 MCG/2ML IJ SOLN
25.0000 ug | INTRAMUSCULAR | Status: DC | PRN
  Administered 2015-08-13: 25 ug via INTRAVENOUS

## 2015-08-13 MED ORDER — PIPERACILLIN-TAZOBACTAM 3.375 G IVPB
3.3750 g | Freq: Three times a day (TID) | INTRAVENOUS | Status: DC
  Filled 2015-08-13 (×2): qty 50

## 2015-08-13 MED ORDER — IPRATROPIUM-ALBUTEROL 0.5-2.5 (3) MG/3ML IN SOLN
RESPIRATORY_TRACT | Status: AC
  Administered 2015-08-13: 3 mL via RESPIRATORY_TRACT
  Filled 2015-08-13: qty 3

## 2015-08-13 MED ORDER — LIDOCAINE HCL (CARDIAC) 20 MG/ML IV SOLN
INTRAVENOUS | Status: DC | PRN
  Administered 2015-08-13: 100 mg via INTRAVENOUS

## 2015-08-13 MED ORDER — HEPARIN SODIUM (PORCINE) 5000 UNIT/ML IJ SOLN
INTRAMUSCULAR | Status: AC
  Filled 2015-08-13: qty 1

## 2015-08-13 MED ORDER — MIDAZOLAM HCL 2 MG/2ML IJ SOLN
INTRAMUSCULAR | Status: DC | PRN
  Administered 2015-08-13: 2 mg via INTRAVENOUS

## 2015-08-13 MED ORDER — DEXAMETHASONE SODIUM PHOSPHATE 10 MG/ML IJ SOLN
INTRAMUSCULAR | Status: DC | PRN
  Administered 2015-08-13: 10 mg via INTRAVENOUS

## 2015-08-13 MED ORDER — FENTANYL CITRATE (PF) 100 MCG/2ML IJ SOLN
INTRAMUSCULAR | Status: AC
  Administered 2015-08-13: 25 ug via INTRAVENOUS
  Filled 2015-08-13: qty 2

## 2015-08-13 MED ORDER — SODIUM CHLORIDE 0.9 % IV SOLN
INTRAVENOUS | Status: DC | PRN
  Administered 2015-08-13: 15 mL via INTRAMUSCULAR

## 2015-08-13 MED ORDER — PHENYLEPHRINE HCL 10 MG/ML IJ SOLN
INTRAMUSCULAR | Status: DC | PRN
  Administered 2015-08-13: 100 ug via INTRAVENOUS
  Administered 2015-08-13 (×3): 200 ug via INTRAVENOUS
  Administered 2015-08-13 (×2): 100 ug via INTRAVENOUS
  Administered 2015-08-13 (×2): 200 ug via INTRAVENOUS
  Administered 2015-08-13 (×2): 100 ug via INTRAVENOUS
  Administered 2015-08-13: 200 ug via INTRAVENOUS
  Administered 2015-08-13: 100 ug via INTRAVENOUS
  Administered 2015-08-13: 200 ug via INTRAVENOUS

## 2015-08-13 MED ORDER — FENTANYL CITRATE (PF) 100 MCG/2ML IJ SOLN
INTRAMUSCULAR | Status: DC | PRN
  Administered 2015-08-13 (×2): 100 ug via INTRAVENOUS

## 2015-08-13 MED ORDER — LIDOCAINE HCL (PF) 1 % IJ SOLN
INTRAMUSCULAR | Status: AC
Start: 2015-08-13 — End: 2015-08-13
  Filled 2015-08-13: qty 30

## 2015-08-13 MED ORDER — ROCURONIUM BROMIDE 100 MG/10ML IV SOLN: INTRAVENOUS | Status: DC | PRN

## 2015-08-13 MED ORDER — SODIUM CHLORIDE 0.9 % IV SOLN
10000.0000 ug | INTRAVENOUS | Status: DC | PRN
  Administered 2015-08-13: 50 ug/min via INTRAVENOUS

## 2015-08-13 MED ORDER — EPHEDRINE SULFATE 50 MG/ML IJ SOLN
INTRAMUSCULAR | Status: DC | PRN
  Administered 2015-08-13 (×3): 10 mg via INTRAVENOUS
  Administered 2015-08-13: 5 mg via INTRAVENOUS

## 2015-08-13 MED ORDER — ROCURONIUM BROMIDE 100 MG/10ML IV SOLN
INTRAVENOUS | Status: DC | PRN
  Administered 2015-08-13: 20 mg via INTRAVENOUS
  Administered 2015-08-13: 10 mg via INTRAVENOUS
  Administered 2015-08-13: 20 mg via INTRAVENOUS
  Administered 2015-08-13: 40 mg via INTRAVENOUS
  Administered 2015-08-13: 10 mg via INTRAVENOUS

## 2015-08-13 MED ORDER — DEXTROSE-NACL 5-0.45 % IV SOLN
INTRAVENOUS | Status: DC
  Administered 2015-08-13: 20:00:00 via INTRAVENOUS

## 2015-08-13 MED ORDER — PIPERACILLIN-TAZOBACTAM 3.375 G IVPB
3.3750 g | Freq: Three times a day (TID) | INTRAVENOUS | Status: DC
  Administered 2015-08-13 – 2015-08-16 (×9): 3.375 g via INTRAVENOUS
  Filled 2015-08-13 (×11): qty 50

## 2015-08-13 MED ORDER — OXYCODONE HCL 5 MG PO TABS: 5.0000 mg | ORAL_TABLET | Freq: Once | ORAL | Status: DC | PRN

## 2015-08-13 MED ORDER — LACTATED RINGERS IV SOLN
INTRAVENOUS | Status: DC | PRN
  Administered 2015-08-13: 13:00:00 via INTRAVENOUS

## 2015-08-13 MED ORDER — ALLOPURINOL 300 MG PO TABS
300.0000 mg | ORAL_TABLET | Freq: Every day | ORAL | Status: DC
  Administered 2015-08-13 – 2015-08-17 (×5): 300 mg via ORAL
  Filled 2015-08-13 (×6): qty 1

## 2015-08-13 SURGICAL SUPPLY — 61 items
BAG DECANTER FOR FLEXI CONT (MISCELLANEOUS) ×2 IMPLANT
BLADE SURG SZ11 CARB STEEL (BLADE) ×2 IMPLANT
CANISTER SUCT 1200ML W/VALVE (MISCELLANEOUS) ×2 IMPLANT
CATH FOLEY 2WAY 12FR 5CC LATEX (CATHETERS) ×2 IMPLANT
CHLORAPREP W/TINT 26ML (MISCELLANEOUS) ×2 IMPLANT
COVER LIGHT HANDLE STERIS (MISCELLANEOUS) ×4 IMPLANT
DRAIN CHEST DRY SUCT SGL (MISCELLANEOUS) ×2 IMPLANT
DRAPE C-ARM XRAY 36X54 (DRAPES) ×2 IMPLANT
DRAPE LAPAROTOMY 77X122 PED (DRAPES) ×2 IMPLANT
DRESSING TELFA 4X3 1S ST N-ADH (GAUZE/BANDAGES/DRESSINGS) ×2 IMPLANT
DRSG TEGADERM 2-3/8X2-3/4 SM (GAUZE/BANDAGES/DRESSINGS) ×4 IMPLANT
DRSG TEGADERM 4X4.75 (GAUZE/BANDAGES/DRESSINGS) ×2 IMPLANT
ELECT CAUTERY BLADE TIP 2.5 (TIP) ×2
ELECT REM PT RETURN 9FT ADLT (ELECTROSURGICAL) ×2
ELECTRODE CAUTERY BLDE TIP 2.5 (TIP) ×1 IMPLANT
ELECTRODE REM PT RTRN 9FT ADLT (ELECTROSURGICAL) ×1 IMPLANT
GAUZE SPONGE 4X4 12PLY STRL (GAUZE/BANDAGES/DRESSINGS) ×2 IMPLANT
GLOVE SURG SYN 7.5  E (GLOVE) ×1
GLOVE SURG SYN 7.5 E (GLOVE) ×1 IMPLANT
GOWN STRL REUS W/ TWL LRG LVL3 (GOWN DISPOSABLE) ×2 IMPLANT
GOWN STRL REUS W/TWL LRG LVL3 (GOWN DISPOSABLE) ×2
HANDLE YANKAUER SUCT BULB TIP (MISCELLANEOUS) ×2 IMPLANT
HOLDER DRSG NASAL ADJUST (MISCELLANEOUS) ×2 IMPLANT
IV NS 500ML (IV SOLUTION) ×1
IV NS 500ML BAXH (IV SOLUTION) ×1 IMPLANT
KIT PLEURX DRAIN CATH 15.5FR (DRAIN) ×4 IMPLANT
KIT PORT POWER 8FR ISP CVUE (Catheter) ×2 IMPLANT
KIT RM TURNOVER STRD PROC AR (KITS) ×2 IMPLANT
LABEL OR SOLS (LABEL) ×2 IMPLANT
MARKER SKIN DUAL TIP RULER LAB (MISCELLANEOUS) ×2 IMPLANT
MICROPUNCTURE 5FR NT-U-SST (MISCELLANEOUS) ×2
NDL SAFETY 22GX1.5 (NEEDLE) ×2 IMPLANT
NEEDLE FILTER BLUNT 18X 1/2SAF (NEEDLE) ×1
NEEDLE FILTER BLUNT 18X1 1/2 (NEEDLE) ×1 IMPLANT
NS IRRIG 500ML POUR BTL (IV SOLUTION) ×2 IMPLANT
PACK BASIN MINOR ARMC (MISCELLANEOUS) ×2 IMPLANT
PACK PORT-A-CATH (MISCELLANEOUS) ×2 IMPLANT
SET DRAINAGE LINE (MISCELLANEOUS) ×4 IMPLANT
SET MICROPUNCTURE 5FR NT-U-SST (MISCELLANEOUS) ×1 IMPLANT
SUCTION FRAZIER HANDLE 10FR (MISCELLANEOUS) ×1
SUCTION TUBE FRAZIER 10FR DISP (MISCELLANEOUS) ×1 IMPLANT
SUT ETH BLK MONO 3 0 FS 1 12/B (SUTURE) ×4 IMPLANT
SUT ETHILON 3-0 FS-10 30 BLK (SUTURE) ×4
SUT ETHILON 4-0 (SUTURE) ×2
SUT ETHILON 4-0 FS2 18XMFL BLK (SUTURE) ×2
SUT PROLENE 2 0 SH DA (SUTURE) ×4 IMPLANT
SUT SILK 0 (SUTURE) ×1
SUT SILK 0 30XBRD TIE 6 (SUTURE) ×1 IMPLANT
SUT SILK 1 SH (SUTURE) ×2 IMPLANT
SUT VIC AB 0 SH 27 (SUTURE) ×6 IMPLANT
SUT VIC AB 2-0 SH 27 (SUTURE) ×4
SUT VIC AB 2-0 SH 27XBRD (SUTURE) ×4 IMPLANT
SUT VIC AB 3-0 SH 27 (SUTURE) ×1
SUT VIC AB 3-0 SH 27X BRD (SUTURE) ×1 IMPLANT
SUTURE EHLN 3-0 FS-10 30 BLK (SUTURE) ×2 IMPLANT
SUTURE ETHLN 4-0 FS2 18XMF BLK (SUTURE) ×2 IMPLANT
SYR 30ML LL (SYRINGE) ×2 IMPLANT
SYR 3ML LL SCALE MARK (SYRINGE) ×2 IMPLANT
SYRINGE 10CC LL (SYRINGE) ×4 IMPLANT
TAPE TRANSPORE STRL 2 31045 (GAUZE/BANDAGES/DRESSINGS) ×4 IMPLANT
TUBING CONNECTING 10 (TUBING) ×2 IMPLANT

## 2015-08-13 NOTE — Progress Notes (Signed)
Pre surgical report reviewed with Butch Penny, [place]. Patient has been NPO since MN. Alert and oriented, consent signed, on chart. Transported in ICU bed with telemetry monitoring, previously on HFNC, changed to NRB at 15 L for transport, advised Eve in PACU. Pipercillin 12.5 ml infusing through left hand IV.

## 2015-08-13 NOTE — Progress Notes (Signed)
Did well overnight.  Less shortness of breath.    Oxygen sats are in low 90s  Lungs are diminished on the right Heart regular  CXRay shows right pleural effusion  Discussed again the proposed procedure - right PleurX catheter and insertion of PortACath.  He is agreeable.

## 2015-08-13 NOTE — Progress Notes (Signed)
Patient return to unit from PACU, on NRB, transitioned to 60%HFNC, SATs 89-90%. Chest tube to pleux  Drain to 20 cm. Alert and oriented, SR, B/P 101/51. IVF changed to D5.45NS per MD order. SCDs applied.  Family in to visit with patient.

## 2015-08-13 NOTE — Op Note (Signed)
  08/10/2015 - 08/13/2015  5:00 PM  PATIENT:  Stuart Spence  55 y.o. 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  INDICATIONS FOR PROCEDURE this is a 55 year old gentleman with a right upper lobe mass and a malignant pleural effusion right side. He had a large pleural effusion which recurred and he was offered the above-named procedure for definitive diagnosis and treatment. Since and risks were explained the patient gave his informed consent  DICTATION: Patient is brought to the operating suite and placed in supine position. General endotracheal anesthesia was given through a single-lumen tube. The patient was turned for a right-sided Pleurx catheter insertion. Patient was prepped and draped in usual sterile fashion. A 2 cm skin incision was made and carried down through the muscles of the chest wall to the pleural space was entered. Upon entering the pleural space I could palpate some hot and some blood within the pleural space. The lung was adherent to the underside of the ribs in multiple locations and I tried to sweep the lung down to allow the catheter to enter. However given the patient's large size this was somewhat difficult to do and affect free up a sufficient portion of the lung was able to place the catheter into the pleural space. The catheter was positioned such that the Decadron cuff was about 2 cm or 3 cm from the skin site. It was then secured to the skin with silk. The muscles of the chest wall closed with interrupted 0 Vicryl the subtest tissues with interrupted 2-0 Vicryl and the skin with nylon. Sterile dressings were applied. The catheter was  placed to 20 cm water suction and a Pleur-evac drainage system. The patient was then turned for a Port-A-Cath insertion.  Using ultrasound the right internal jugular vein was identified was found to be suitable for cannulation. Patient was prepped and draped in usual sterile fashion. Again using ultrasound the right internal jugular vein was percutaneously catheterized and a wire was placed under fluoroscopic guidance into the right side of the heart and into the inferior vena cava. The port site was selected on the right anterior chest wall and a skin incision was made to accommodate the port. The catheter was tunneled from the port site up to the neck and was then placed through a peel-away sheath and positioned at the junction of the right atrium superior vena cava. The catheter was assembled and the catheter was secured to the anterior chest wall with interrupted 2-0 Prolene sutures on the 3 corners. The catheter irrigated and flushed nicely and fluoroscopy confirmed the presence of the catheter to be in good position without any untoward complications. The catheter was flushed one last time and in the wounds were closed using 2-0 Vicryl and the subtest tissues and 4-0 nylon on the skin. The neck incision was closed with a 4-0 Prolene. Sterile dressings were applied.  The patient was awakened and taken to the recovery room in stable condition after he was extubated.   Nestor Lewandowsky, MD

## 2015-08-13 NOTE — H&P (View-Only) (Signed)
Patient ID: Abem Shaddix, male   DOB: May 28, 1960, 55 y.o.   MRN: [id number]  Chief Complaint  Patient presents with  . Respiratory Distress    Referred By Dr. Delight Hoh Reason for Referral malignant pleural effusion right side  HPI Location, Quality, Duration, Severity, Timing, Context, Modifying Factors, Associated Signs and Symptoms.  Janet Decesare is a 55 y.o. male.  This patient is a 55 year old nonsmoking male who presented with a several week history of increasing shortness of breath and feeling tired and run down. He presented to an outside facility where chest x-ray showed a very large right-sided pleural effusion and a subsequent CT scan confirmed the presence of a large right-sided pleural effusion with destruction of the right fourth rib anteriorly. 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 Over the last 2 days he said 2 thoracenteses with removal of approximately 4 L of pleural fluid which was described as bloody. He states that he feels significantly better after his thoracentesis but still remains with oxygen saturations in the high 80s and low 90s. He has a family history of lung cancer and denies any personal smoking history or asbestos exposure.   Past Medical History  Diagnosis Date  . Depression   . Gout   . ED (erectile dysfunction)   . Hypertension     Past Surgical History  Procedure Laterality Date  . Tonsillectomy      Family History  Problem Relation Age of Onset  . Cancer Mother 33    lung  . Heart attack Father 65  . Hypertension Father   . Congestive Heart Failure Father 15    died from  . Diabetes Sister     Social History Social History  Substance Use Topics  . Smoking status: Never Smoker   . Smokeless tobacco: Never Used  . Alcohol Use: No     Comment: gave it up in August    No Known Allergies  Current  Facility-Administered Medications  Medication Dose Route Frequency Provider Last Rate Last Dose  . acetaminophen (TYLENOL) tablet 650 mg  650 mg Oral Q6H PRN Lytle Butte, MD       Or  . acetaminophen (TYLENOL) suppository 650 mg  650 mg Rectal Q6H PRN Lytle Butte, MD      . alum & mag hydroxide-simeth (MAALOX/MYLANTA) 200-200-20 MG/5ML suspension 30 mL  30 mL Oral Q4H PRN Flora Lipps, MD   30 mL at 08/12/15 1420  . amLODipine (NORVASC) tablet 10 mg  10 mg Oral Daily Lytle Butte, MD   10 mg at 08/12/15 1052  . benazepril (LOTENSIN) tablet 40 mg  40 mg Oral Daily Lytle Butte, MD   40 mg at 08/12/15 1053  . [START ON 08/13/2015] FLUoxetine (PROZAC) capsule 40 mg  40 mg Oral Daily Evlyn Kanner, NP      . insulin aspart (novoLOG) injection 0-15 Units  0-15 Units Subcutaneous TID WC Laverle Hobby, MD   2 Units at 08/12/15 1652  . insulin aspart (novoLOG) injection 0-5 Units  0-5 Units Subcutaneous QHS Laverle Hobby, MD   0 Units at 08/11/15 2146  . ipratropium-albuterol (DUONEB) 0.5-2.5 (3) MG/3ML nebulizer solution 3 mL  3 mL Nebulization Q4H Lytle Butte, MD   3 mL at 08/12/15 1522  . labetalol (NORMODYNE,TRANDATE) injection 10 mg  10 mg Intravenous Q2H PRN Lance Coon, MD  10 mg at 08/10/15 2329  . levofloxacin (LEVAQUIN) IVPB 750 mg  750 mg Intravenous Q24H Srikar Sudini, MD   750 mg at 08/12/15 1052  . methylPREDNISolone sodium succinate (SOLU-MEDROL) 40 mg/mL injection 40 mg  40 mg Intravenous Q24H Lytle Butte, MD   40 mg at 08/11/15 2106  . morphine 2 MG/ML injection 2 mg  2 mg Intravenous Q4H PRN Lytle Butte, MD   2 mg at 08/10/15 2229  . ondansetron (ZOFRAN) tablet 4 mg  4 mg Oral Q6H PRN Lytle Butte, MD       Or  . ondansetron [facility]) injection 4 mg  4 mg Intravenous Q6H PRN Lytle Butte, MD      . oxyCODONE (Oxy IR/ROXICODONE) immediate release tablet 5 mg  5 mg Oral Q4H PRN Lytle Butte, MD   5 mg at 08/10/15 1849  . senna-docusate (Senokot-S) tablet 1  tablet  1 tablet Oral BID Fritzi Mandes, MD   1 tablet at 08/12/15 1052      Review of Systems A complete review of systems was asked and was negative except for the following positive findingsIncreasing shortness of breath, increasing abdominal girth, swelling of his lower extremities.  Blood pressure 114/63, pulse 103, temperature 99.8 F (37.7 C), temperature source Oral, resp. rate 19, height '5\' 7"'$  (1.702 m), weight 240 lb 4.8 oz (109 kg), SpO2 93 %.  Physical Exam CONSTITUTIONAL:  Pleasant, well-developed, well-nourished, and in no acute distress. EYES: Pupils equal and reactive to light, Sclera non-icteric EARS, NOSE, MOUTH AND THROAT:  The oropharynx was clear.  Dentition is good repair.  Oral mucosa pink and moist. LYMPH NODES:  Lymph nodes in the neck and axillae were normal RESPIRATORY:  Lungs were clear on the left but diminished on the right..  Normal respiratory effort without pathologic use of accessory muscles of respiration CARDIOVASCULAR: Heart was regular without murmurs.  There were no carotid bruits. GI: The abdomen was soft, nontender, and nondistended. There were no palpable masses. There was no hepatosplenomegaly. There were normal bowel sounds in all quadrants. GU:  Rectal deferred.   MUSCULOSKELETAL:  Normal muscle strength and tone.  No clubbing or cyanosis.   SKIN:  There were no pathologic skin lesions.  There were no nodules on palpation. NEUROLOGIC:  Sensation is normal.  Cranial nerves are grossly intact. PSYCH:  Oriented to person, place and time.  Mood and affect are normal.  Data Reviewed Chest x-rays and CT scans  I have personally reviewed the patient's imaging, laboratory findings and medical records.    Assessment    I have independently reviewed the patient's chest x-rays and CT scans. There is an extensive right-sided pleural effusion which has now been drained. There is some pleural thickening and pleural nodularity as well as a right upper lobe  mass and destruction of the fourth rib. 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 I had a long discussion with he and his wife and other family members today about the options for management. I've been asked to see the patient for insertion of Port-A-Cath and also for a Pleurx catheter or thoracoscopy with talc pleurodesis. I spent a long time reviewing with the patient and his family the indications and risks of these procedures as well as the advantages and disadvantages. After an extensive discussion the patient would like to proceed with Port-A-Cath insertion and Pleurx catheter insertion. He understands the risks involved as well as the need for continued maintenance of  these catheters.    Plan    I had a long discussion today with Dr. Louie Bun who is in agreement with the above plan. In addition the patient had QUESTIONS answered. We will proceed with insertion of the Port-A-Cath and Pleurx catheter soon as possible.       Nestor Lewandowsky, MD 08/12/2015, 7:50 PM

## 2015-08-13 NOTE — Anesthesia Postprocedure Evaluation (Signed)
Anesthesia Post Note  Patient: Stuart Spence  Procedure(s) Performed: Procedure(s) (LRB): INSERTION PORT-A-CATH (Left) CHEST TUBE INSERTION (Left)  Patient location during evaluation: PACU Anesthesia Type: General Level of consciousness: awake and alert Pain management: pain level controlled Vital Signs Assessment: post-procedure vital signs reviewed and stable Respiratory status: spontaneous breathing, nonlabored ventilation, respiratory function stable and patient connected to nasal cannula oxygen Cardiovascular status: blood pressure returned to baseline and stable Postop Assessment: no signs of nausea or vomiting Anesthetic complications: no    Last Vitals:  Filed Vitals:   08/13/15 1710 08/13/15 1726  BP: 103/45 104/49  Pulse: 95 90  Temp:  36.4 C  Resp: 13 14    Last Pain:  Filed Vitals:   08/13/15 1811  PainSc: [name]

## 2015-08-13 NOTE — Progress Notes (Signed)
Pharmacy Antibiotic Note  Stuart Spence is a 55 y.o. male admitted on 08/10/2015 with pneumonia.  Pharmacy has been consulted for Zosyn dosing.  Plan: Zosyn 3.375g IV q8h (4 hour infusion).  Height: '5\' 7"'$  (170.2 cm) Weight: 240 lb 4.8 oz (109 kg) IBW/kg (Calculated) : 66.1  Temp (24hrs), Avg:99 F (37.2 C), Min:98.2 F (36.8 C), Max:99.8 F (37.7 C)   Recent Labs Lab 08/10/15 1554 08/11/15 0320 08/12/15 0318 08/13/15 0917  WBC 11.7* 11.1* 14.4* 15.6*  CREATININE 1.01 1.13 1.34* 1.18    Estimated Creatinine Clearance: 84.3 mL/min (by C-G formula based on Cr of 1.18).    No Known Allergies  Antimicrobials this admission: Levofloxacin 5/7 >> 5/9 Zosyn 5/10 >>   Microbiology results: 5/7 BCx: no growth x 3 days 5/7 MRSA PCR: negative   Pharmacy will continue to monitor and adjust per consult.    Simpson,Michael L 08/13/2015 4:23 PM

## 2015-08-13 NOTE — Transfer of Care (Signed)
Immediate Anesthesia Transfer of Care Note  Patient: Stuart Spence  Procedure(s) Performed: Procedure(s): INSERTION PORT-A-CATH (Left) CHEST TUBE INSERTION (Left)  Patient Location: PACU  Anesthesia Type:General  Level of Consciousness: awake and sedated  Airway & Oxygen Therapy: Patient Spontanous Breathing and Patient connected to face mask oxygen  Post-op Assessment: Report given to RN and Post -op Vital signs reviewed and stable  Post vital signs: Reviewed and stable  Last Vitals:  Filed Vitals:   08/13/15 1259 08/13/15 1623  BP:  113/87  Pulse: 82 94  Temp:  36.6 C  Resp: 23 19    Last Pain:  Filed Vitals:   08/13/15 1624  PainSc: 0-No pain         Complications: No apparent anesthesia complications

## 2015-08-13 NOTE — Anesthesia Procedure Notes (Signed)
Procedure Name: Intubation Date/Time: 08/13/2015 1:50 PM Performed by: Nelda Marseille Pre-anesthesia Checklist: Patient identified, Patient being monitored, Timeout performed, Emergency Drugs available and Suction available Patient Re-evaluated:Patient Re-evaluated prior to inductionOxygen Delivery Method: Circle system utilized Preoxygenation: Pre-oxygenation with 100% oxygen Intubation Type: IV induction Ventilation: Mask ventilation without difficulty Laryngoscope Size: McGraph and 4 Grade View: Grade III Tube type: Oral Tube size: 7.5 mm Number of attempts: 1 Airway Equipment and Method: Stylet Placement Confirmation: ETT inserted through vocal cords under direct vision,  positive ETCO2 and breath sounds checked- equal and bilateral Secured at: 21 cm Tube secured with: Tape Dental Injury: Teeth and Oropharynx as per pre-operative assessment

## 2015-08-13 NOTE — Progress Notes (Signed)
PULMONARY / CRITICAL CARE MEDICINE   Name: Stuart Spence MRN: [id number] DOB: [birth year]    ADMISSION DATE:  08/10/2015    DISCUSSION: 55 yo male presenting with possible neoplastic lung mass, total right lung collapse, large right pleural effusion and post-obstruction pneumonia; hypoxic requiring high flow [place].   ASSESSMENT / PLAN:  PULMONARY A: Acute respiratory failure 2/2 occlusive right lung mass and pleural effusion, status post thoracentesis 2 Preliminary report positive for adeno CA.  P:   -Patient continues to require 40% high flow. We'll continue to wean down oxygen as tolerated. Thoracic surgery was consulted for placement of a Port-A-Cath, as well as Pleurx catheter. -Nebulized bronchodilator   CARDIOVASCULAR A:  H/o Hypertension P:  -Continue home BP meds  RENAL A:   Hyponatremia P:   -monitor and replace lectrolytes  INFECTIOUS A:   Post-obstructive pneumonia P:   -Levaquin. Will change to Zosyn, given continued respiratory failure, which persisted after drainage of pleural fluid. -F/U cultures -We'll check pro-calcitonin.  CULTURES: MRSA screen-negative Blood cultures X 2 05/07>  ANTIBIOTICS: Levaquin 5/9>>5/10 Zosyn5/10   MS A:   H/O Gout  P:   Will start allopurinol.  NEUROLOGIC A:   Depression ED P:   RASS goal: N/A -Monitor neuro status -Resume home antidepressant  SIGNIFICANT EVENTS: 05/07>Admitted with lung mass, right pleural effusion and right lung collapse 5/8>> thoracentesis of the right space with 2.4 L of bloody fluid drained. 5/9>> repeat thoracentesis of the right side with 2.3 L of bloody fluid drained.   LINES/TUBES: PIVs   Best Practice: Code Status:  Full. Diet: asvance as tolerated.  GI prophylaxis:  n/a. VTE prophylaxis:  SCD's / lovenox  ----------  SUBJECTIVE: No acute changes overnight. Patient is awake and alert this morning, he complains of mild dyspnea. He has been informed of his diagnosis of  cancer, he remains hopeful in terms of receiving treatment and being cured.  VITAL SIGNS: BP 126/56 mmHg  Pulse 86  Temp(Src) 98.6 F (37 C) (Oral)  Resp 20  Ht '5\' 7"'$  (1.702 m)  Wt 240 lb 4.8 oz (109 kg)  BMI 37.63 kg/m2  SpO2 93%  HEMODYNAMICS:    VENTILATOR SETTINGS: Vent Mode:  [-]  FiO2 (%):  [50 %] 50 %  INTAKE / OUTPUT: I/O last 3 completed shifts: In: 150 [IV Piggyback:150] Out: 1320 [Urine:1320]  PHYSICAL EXAMINATION: General: mild respiratory distress Neuro: AAO X4, no focal deficits HEENT: [place]/AT, PERRLA, oral mucosa pink, trachea midline Cardiovascular: RRR, S1/S2, no MRG Lungs:  Increased WOB, limited airflow right lung field, worse in the bases, Abdomen: Non-distended, soft, normal bowel sounds Musculoskeletal: +ROM Skin: Warm and dry  LABS:  BMET  Recent Labs Lab 08/10/15 1554 08/11/15 0320 08/12/15 0318  NA 132* 131* 132*  K 3.7 4.2 4.5  CL 95* 93* 93*  CO2 '27 30 31  '$ BUN 15 16 30*  CREATININE 1.01 1.13 1.34*  GLUCOSE 154* 196* 187*    Electrolytes  Recent Labs Lab 08/10/15 1554 08/11/15 0320 08/12/15 0318  CALCIUM 8.6* 8.8* 8.9    CBC  Recent Labs Lab 08/10/15 1554 08/11/15 0320 08/12/15 0318  WBC 11.7* 11.1* 14.4*  HGB 13.1 13.3 13.2  HCT 38.9* 38.8* 39.2*  PLT 372 385 391    Coag's  Recent Labs Lab 08/12/15 1644  APTT 27  INR 1.19    Sepsis Markers No results for input(s): LATICACIDVEN, PROCALCITON, O2SATVEN in the last 168 hours.  ABG  Recent Labs Lab 08/12/15 0408  PHART 7.47*  PCO2ART 45  PO2ART 57*    Liver Enzymes  Recent Labs Lab 08/10/15 1554 08/11/15 1106  AST 25  --   ALT 28  --   ALKPHOS 90  --   BILITOT 0.7  --   ALBUMIN 3.2* 3.2*    Cardiac Enzymes  Recent Labs Lab 08/10/15 1554  TROPONINI <0.03    Glucose  Recent Labs Lab 08/11/15 2142 08/12/15 0751 08/12/15 1118 08/12/15 1621 08/12/15 2013 08/13/15 0713  GLUCAP 123* 165* 136* 122* 111* 173*    Imaging Ct  Chest Wo Contrast  08/12/2015  CLINICAL DATA:  Post thoracentesis EXAM: CT CHEST WITHOUT CONTRAST TECHNIQUE: Multidetector CT imaging of the chest was performed following the standard protocol without IV contrast. COMPARISON:  08/10/2015 FINDINGS: THORACIC INLET/BODY WALL: No acute abnormality. MEDIASTINUM: Normal heart size. Trace fluid or thickening at the cardiac apex. No acute vascular abnormality. No adenopathy. LUNG WINDOWS: 34 mm right upper lobe mass which correlates with hypo enhancing area on previous CT. Mass is along the apical bronchi. This is primarily concerning for malignancy given the lytic right fourth rib lesion with extensive erosion and subpleural extension. There is a smaller mildly expansile lesion in the lateral right fourth rib. There is patchy atelectasis in the right lung with septal thickening and ground-glass opacity attributed to re-expansion edema. The left lung is clear. UPPER ABDOMEN: No signs of metastatic disease. OSSEOUS: Extensive right fourth rib lesion as described above. IMPRESSION: 1. Persistent right upper lobe mass that is primarily concerning for bronchogenic carcinoma. The mass is along the apical segmental bronchi. 2. 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 Primary bone tumor with pleural and parenchymal spread is a diagnostic consideration in this never smoker, correlate with cytology from pleural fluid. 3. Atelectasis and re-expansion edema on the right. Electronically Signed   By: Monte Fantasia M.D.   On: 08/12/2015 10:56   Dg Chest Port 1 View  08/13/2015  CLINICAL DATA:  55 year old male recently status post right thoracentesis for large right pleural effusion. Right upper lobe mass on post thoracentesis CT. Initial encounter. EXAM: PORTABLE CHEST 1 VIEW COMPARISON:  Post thoracentesis Chest CT 08/12/2015, pre thoracentesis chest CTA 08/10/2015, and earlier. FINDINGS: Portable AP upright view at 0755 hours. Slightly lower lung volumes. The  left lung remains grossly clear. Left mediastinal contours remain normal. Masslike right suprahilar contour abnormality (arrow). Patchy and confluent residual mid and lower right lung opacity. Evidence of residual right pleural effusion also tracking into the apex. No pneumothorax. IMPRESSION: Stable compared to the post thoracentesis CT yesterday. Mass-like right suprahilar mediastinal contour abnormality. Residual right pleural effusion and lung opacity. Electronically Signed   By: Genevie Ann M.D.   On: 08/13/2015 08:16   US Thoracentesis Asp Pleural Space W/img Guide  08/12/2015  INDICATION: Right pleural effusion. EXAM: ULTRASOUND GUIDED right THORACENTESIS MEDICATIONS: None. COMPLICATIONS: None immediate. PROCEDURE: An ultrasound guided thoracentesis was thoroughly discussed with the patient and questions answered. The benefits, risks, alternatives and complications were also discussed. The patient understands and wishes to proceed with the procedure. Written consent was obtained. Ultrasound was performed to localize and mark an adequate pocket of fluid in the right chest. The area was then prepped and draped in the normal sterile fashion. 1% Lidocaine was used for local anesthesia. Under ultrasound guidance a Safe-T-Centesis catheter was introduced. Thoracentesis was performed. The catheter was removed and a dressing applied. FINDINGS: A total of approximately 2.3 L of bloody fluid was removed. Samples were sent to  the laboratory as requested by the clinical team. IMPRESSION: Successful ultrasound guided right thoracentesis yielding 2.3 L of pleural fluid. Electronically Signed   By: Marijo Conception, M.D.   On: 08/12/2015 10:50        -Marda Stalker, M.D.  08/13/2015 Critical Care Attestation.  I have personally obtained a history, examined the patient, evaluated laboratory and imaging results, formulated the assessment and plan and placed orders. The Patient requires high complexity decision  making for assessment and support, frequent evaluation and titration of therapies, application of advanced monitoring technologies and extensive interpretation of multiple databases. The patient has critical illness that could lead imminently to failure of 1 or more organ systems and requires the highest level of physician preparedness to intervene.  Critical Care Time devoted to patient care services described in this note is 35 minutes and is exclusive of time spent in procedures.

## 2015-08-13 NOTE — Interval H&P Note (Signed)
History and Physical Interval Note:  08/13/2015 11:33 AM  Stuart Spence  has presented today for surgery, with the diagnosis of Ca  The various methods of treatment have been discussed with the patient and family. After consideration of risks, benefits and other options for treatment, the patient has consented to  Procedure(s): INSERTION PORT-A-CATH (Left) CHEST TUBE INSERTION (Left) as a surgical intervention .  The patient's history has been reviewed, patient examined, no change in status, stable for surgery.  I have reviewed the patient's chart and labs.  Questions were answered to the patient's satisfaction.     Nestor Lewandowsky

## 2015-08-13 NOTE — Progress Notes (Signed)
Patient returned from all are, status post Pleurx catheter placement and Port-A-Cath placement. RN noted increased oxygen requirements at this time, currently at 60-70% high flow.   Given the patient's severe lung disease, asked RN to titrate high flow to O2 sat of greater than 85%  Deep Ashby Dawes, M.D.  08/13/2015

## 2015-08-13 NOTE — Care Management (Signed)
CM assessment. Admitted with possible lung mass, total right lung collapse,  right pleural effusion s/p thoracentesis x 2, post. obstruction pneumonia; High flow [place]. Preliminary report positive for adenocarcinoma. For PAC and pleurex drain today. Long term plan is for aggressive treatment of CA. Following progression.

## 2015-08-13 NOTE — Progress Notes (Signed)
Patient ID: Stuart Spence, male   DOB: [birth year], 55 y.o.   MRN: [id number] [place] [facility] [place] NAME: Stuart Spence    MR#:  [id number]  DATE OF BIRTH:  March 12, 1961  SUBJECTIVE:  Somewhat anxious about his diagnosis and prognosis. Had a long discussion with him. Breathing much improved. Patient underwent thoracentesis second time yesterday removal of 2.3 L Remains on high flow nasal cannula. REVIEW OF SYSTEMS:   Review of Systems  Constitutional: Negative for fever, chills and weight loss.  HENT: Negative for ear discharge, ear pain and nosebleeds.   Eyes: Negative for blurred vision, pain and discharge.  Respiratory: Positive for shortness of breath. Negative for sputum production, wheezing and stridor.   Cardiovascular: Negative for chest pain, palpitations, orthopnea and PND.  Gastrointestinal: Negative for nausea, vomiting, abdominal pain and diarrhea.  Genitourinary: Negative for urgency and frequency.  Musculoskeletal: Negative for back pain and joint pain.  Neurological: Positive for weakness. Negative for sensory change, speech change and focal weakness.  Psychiatric/Behavioral: Negative for depression and hallucinations. The patient is not nervous/anxious.   All other systems reviewed and are negative.  Tolerating Diet:yes Tolerating PT: yes  DRUG ALLERGIES:  No Known Allergies  VITALS:  Blood pressure 126/56, pulse 86, temperature 98.6 F (37 C), temperature source Oral, resp. rate 20, height '5\' 7"'$  (1.702 m), weight 109 kg (240 lb 4.8 oz), SpO2 93 %.  PHYSICAL EXAMINATION:   Physical Exam  GENERAL:  55 y.o.-year-old patient lying in the bed with no acute distress.  EYES: Pupils equal, round, reactive to light and accommodation. No scleral icterus. Extraocular muscles intact.  HEENT: Head atraumatic, normocephalic. Oropharynx and nasopharynx clear. High flow nasal cannula oxygen NECK:  Supple, no jugular venous  distention. No thyroid enlargement, no tenderness.  LUNGSDecreased breath sounds bilaterally more on the right than left. breath sounds bilaterally, no wheezing, rales, rhonchi. No use of accessory muscles of respiration.  CARDIOVASCULAR: S1, S2 normal. No murmurs, rubs, or gallops.  ABDOMEN: Soft, nontender, nondistended. Bowel sounds present. No organomegaly or mass.  EXTREMITIES: No cyanosis, clubbing or edema b/l.    NEUROLOGIC: Cranial nerves II through XII are intact. No focal Motor or sensory deficits b/l.   PSYCHIATRIC:  patient is alert and oriented x 3.  SKIN: No obvious rash, lesion, or ulcer.   LABORATORY PANEL:  CBC  Recent Labs Lab 08/12/15 0318  WBC 14.4*  HGB 13.2  HCT 39.2*  PLT 391    Chemistries   Recent Labs Lab 08/10/15 1554  08/12/15 0318  NA 132*  < > 132*  K 3.7  < > 4.5  CL 95*  < > 93*  CO2 27  < > 31  GLUCOSE 154*  < > 187*  BUN 15  < > 30*  CREATININE 1.01  < > 1.34*  CALCIUM 8.6*  < > 8.9  AST 25  --   --   ALT 28  --   --   ALKPHOS 90  --   --   BILITOT 0.7  --   --   < > = values in this interval not displayed. Cardiac Enzymes  Recent Labs Lab 08/10/15 1554  TROPONINI <0.03   RADIOLOGY:  Dg Chest 1 View  08/12/2015  CLINICAL DATA:  Right thoracentesis planned for today; acute respiratory failure with hypoxia. EXAM: CHEST 1 VIEW COMPARISON:  Portable chest x-ray of Aug 11, 2015 FINDINGS: The right lung is nearly totally atelectatic. There  is no shift of the mediastinum. The left lung is well-expanded. The interstitial markings on the left are mildly prominent though stable. There is no alveolar infiltrate. The left heart border is normal. There is no left pleural effusion. IMPRESSION: Near total atelectasis of the right lung with moderate sized pleural effusion surrounding the lung. No significant mediastinal shift. No acute abnormality on the left. Electronically Signed   By: David  Martinique M.D.   On: 08/12/2015 07:55   Ct Chest Wo  Contrast  08/12/2015  CLINICAL DATA:  Post thoracentesis EXAM: CT CHEST WITHOUT CONTRAST TECHNIQUE: Multidetector CT imaging of the chest was performed following the standard protocol without IV contrast. COMPARISON:  08/10/2015 FINDINGS: THORACIC INLET/BODY WALL: No acute abnormality. MEDIASTINUM: Normal heart size. Trace fluid or thickening at the cardiac apex. No acute vascular abnormality. No adenopathy. LUNG WINDOWS: 34 mm right upper lobe mass which correlates with hypo enhancing area on previous CT. Mass is along the apical bronchi. This is primarily concerning for malignancy given the lytic right fourth rib lesion with extensive erosion and subpleural extension. There is a smaller mildly expansile lesion in the lateral right fourth rib. There is patchy atelectasis in the right lung with septal thickening and ground-glass opacity attributed to re-expansion edema. The left lung is clear. UPPER ABDOMEN: No signs of metastatic disease. OSSEOUS: Extensive right fourth rib lesion as described above. IMPRESSION: 1. Persistent right upper lobe mass that is primarily concerning for bronchogenic carcinoma. The mass is along the apical segmental bronchi. 2. 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 Primary bone tumor with pleural and parenchymal spread is a diagnostic consideration in this never smoker, correlate with cytology from pleural fluid. 3. Atelectasis and re-expansion edema on the right. Electronically Signed   By: Monte Fantasia M.D.   On: 08/12/2015 10:56   Dg Chest Port 1 View  08/11/2015  CLINICAL DATA:  Post right thoracentesis EXAM: PORTABLE CHEST 1 VIEW COMPARISON:  CT of the chest 08/10/2015 FINDINGS: Again noted complete opacification of the right hemithorax final combination of large right pleural effusion with atelectasis, infiltrate or consolidation. Left lung is clear. There is no pneumothorax. IMPRESSION: Again noted complete opacification of the right hemi thorax probable  combination of pleural effusion with infiltrate/consolidation. There is no pneumothorax. Left lung is clear. Electronically Signed   By: Lahoma Crocker M.D.   On: 08/11/2015 14:47   US Thoracentesis Asp Pleural Space W/img Guide  08/12/2015  INDICATION: Right pleural effusion. EXAM: ULTRASOUND GUIDED right THORACENTESIS MEDICATIONS: None. COMPLICATIONS: None immediate. PROCEDURE: An ultrasound guided thoracentesis was thoroughly discussed with the patient and questions answered. The benefits, risks, alternatives and complications were also discussed. The patient understands and wishes to proceed with the procedure. Written consent was obtained. Ultrasound was performed to localize and mark an adequate pocket of fluid in the right chest. The area was then prepped and draped in the normal sterile fashion. 1% Lidocaine was used for local anesthesia. Under ultrasound guidance a Safe-T-Centesis catheter was introduced. Thoracentesis was performed. The catheter was removed and a dressing applied. FINDINGS: A total of approximately 2.3 L of bloody fluid was removed. Samples were sent to the laboratory as requested by the clinical team. IMPRESSION: Successful ultrasound guided right thoracentesis yielding 2.3 L of pleural fluid. Electronically Signed   By: Marijo Conception, M.D.   On: 08/12/2015 10:50   US Thoracentesis Asp Pleural Space W/img Guide  08/11/2015  INDICATION: Symptomatic right sided pleural effusion. Please perform ultrasound-guided thoracentesis  for diagnostic and therapeutic purposes. EXAM: US THORACENTESIS ASP PLEURAL SPACE W/IMG GUIDE COMPARISON:  Chest radiograph - 08/10/2015; chest CTA - 08/10/2015 MEDICATIONS: None. COMPLICATIONS: None immediate. TECHNIQUE: Informed written consent was obtained from the patient after a discussion of the risks, benefits and alternatives to treatment. A timeout was performed prior to the initiation of the procedure. Initial ultrasound scanning demonstrates a large  anechoic right-sided pleural pleural effusion compatible the findings seen on preceding chest CTA. The lower chest was prepped and draped in the usual sterile fashion. 1% lidocaine was used for local anesthesia. An ultrasound image was saved for documentation purposes. An 8 Fr Safe-T-Centesis catheter was introduced. The thoracentesis was performed. The catheter was removed and a dressing was applied. The patient tolerated the procedure well without immediate post procedural complication. The patient was escorted to have an upright chest radiograph. FINDINGS: A total of approximately 2.4 liters of brown colored serous fluid was removed. Requested samples were sent to the laboratory. IMPRESSION: Successful ultrasound-guided right sided thoracentesis yielding 2.4 liters of pleural fluid. PLAN: Completion ultrasound-guided thoracentesis could be performed in the coming 1-2 days following this initial large volume thoracentesis as clinically indicated. Electronically Signed   By: Sandi Mariscal M.D.   On: 08/11/2015 14:56   ASSESSMENT AND PLAN:  Audie Stayer is a 55 y.o. male with a known history of Essential hypertension who is presenting with shortness of breath. He describes having progressive shortness of breath originally dyspnea on exertion but now even at rest for about 1 month. He went to an outside facility about a week ago given symptoms of nonproductive cough and shortness of breath pleuritic chest pain at that time had a chest x-ray and CAT scan performed which revealed a right lung mass.  * Right lung mass with right lung collhelp.apse and Malignant pleural effusion /acute hypoxic respiratory failure Pulmonology and cardiothoracic with Dr. 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 -On  Levaquin due to concern for postobstructive pneumonia. 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  * Hypertension Continue Norvasc and benazepril.  * DVT prophylaxis with Lovenox  Case discussed with Care Management/Social Worker. Management plans discussed with the patient, family and they are in agreement.  CODE STATUS:full  DVT Prophylaxis: lovenox  TOTAL criticalTIME TAKING CARE OF THIS PATIENT:35 minutes.  >50% time spent on counselling and coordination of care   D/C DEPENDING ON CLINICAL CONDITION.  Note: This dictation was prepared with Dragon dictation along with smaller phrase technology. Any transcriptional errors that result from this process are unintentional.  Mitsugi Schrader M.D on 08/13/2015 at 8:17 AM  Between 7am to 6pm - Pager - [phone]  After 6pm go to [email or web address] - password [email or web address] [facility]  Office  [phone]  CC: Primary care physician; Golden Pop, MD

## 2015-08-13 NOTE — Anesthesia Preprocedure Evaluation (Signed)
Anesthesia Evaluation  Patient identified by MRN, date of birth, ID band Patient awake    Reviewed: Allergy & Precautions, H&P , NPO status , Patient's Chart, lab work & pertinent test results  History of Anesthesia Complications Negative for: history of anesthetic complications  Airway Mallampati: II  TM Distance: >3 FB Neck ROM: full    Dental  (+) Poor Dentition, Chipped   Pulmonary shortness of breath and at rest,    Pulmonary exam normal breath sounds clear to auscultation       Cardiovascular Exercise Tolerance: Good hypertension, (-) angina(-) Past MI and (-) DOE Normal cardiovascular exam Rhythm:regular Rate:Normal     Neuro/Psych PSYCHIATRIC DISORDERS negative neurological ROS     GI/Hepatic negative GI ROS, Neg liver ROS, neg GERD  ,  Endo/Other  negative endocrine ROS  Renal/GU negative Renal ROS  negative genitourinary   Musculoskeletal negative musculoskeletal ROS (+)   Abdominal   Peds negative pediatric ROS (+)  Hematology negative hematology ROS (+)   Anesthesia Other Findings Past Medical History:   Depression                                                   Gout                                                         ED (erectile dysfunction)                                    Hypertension                                                Past Surgical History:   TONSILLECTOMY                                                BMI    Body Mass Index   37.62 kg/m 2      Reproductive/Obstetrics negative OB ROS                             Anesthesia Physical Anesthesia Plan  ASA: III  Anesthesia Plan: General ETT   Post-op Pain Management:    Induction:   Airway Management Planned:   Additional Equipment:   Intra-op Plan:   Post-operative Plan:   Informed Consent: I have reviewed the patients History and Physical, chart, labs and discussed the procedure  including the risks, benefits and alternatives for the proposed anesthesia with the patient or authorized representative who has indicated his/her understanding and acceptance.   Dental Advisory Given  Plan Discussed with: Anesthesiologist, CRNA and Surgeon  Anesthesia Plan Comments:         Anesthesia Quick Evaluation

## 2015-08-14 ENCOUNTER — Inpatient Hospital Stay: Payer: No Typology Code available for payment source

## 2015-08-14 ENCOUNTER — Encounter: Payer: Self-pay | Admitting: Cardiothoracic Surgery

## 2015-08-14 DIAGNOSIS — Z77098 Contact with and (suspected) exposure to other hazardous, chiefly nonmedicinal, chemicals: Secondary | ICD-10-CM

## 2015-08-14 DIAGNOSIS — R61 Generalized hyperhidrosis: Secondary | ICD-10-CM

## 2015-08-14 DIAGNOSIS — J188 Other pneumonia, unspecified organism: Secondary | ICD-10-CM

## 2015-08-14 DIAGNOSIS — Z9981 Dependence on supplemental oxygen: Secondary | ICD-10-CM

## 2015-08-14 DIAGNOSIS — C3481 Malignant neoplasm of overlapping sites of right bronchus and lung: Secondary | ICD-10-CM

## 2015-08-14 LAB — BASIC METABOLIC PANEL
Anion gap: 5 (ref 5–15)
BUN: 34 mg/dL — AB (ref 6–20)
CHLORIDE: 94 mmol/L — AB (ref 101–111)
CO2: 33 mmol/L — AB (ref 22–32)
CREATININE: 1.12 mg/dL (ref 0.61–1.24)
Calcium: 8.5 mg/dL — ABNORMAL LOW (ref 8.9–10.3)
GFR calc non Af Amer: >60 mL/min (ref >60)
GLUCOSE: 169 mg/dL — AB (ref 65–99)
Potassium: 4.6 mmol/L (ref 3.5–5.1)
Sodium: 132 mmol/L — ABNORMAL LOW (ref 135–145)

## 2015-08-14 LAB — CBC
HEMATOCRIT: 37.1 % — AB (ref 40.0–52.0)
HEMOGLOBIN: 12.5 g/dL — AB (ref 13.0–18.0)
MCH: 30.4 pg (ref 26.0–34.0)
MCHC: 33.7 g/dL (ref 32.0–36.0)
MCV: 90.3 fL (ref 80.0–100.0)
Platelets: 369 10*3/uL (ref 150–440)
RBC: 4.11 MIL/uL — ABNORMAL LOW (ref 4.40–5.90)
RDW: 13 % (ref 11.5–14.5)
WBC: 17.5 10*3/uL — AB (ref 3.8–10.6)

## 2015-08-14 LAB — GLUCOSE, CAPILLARY
GLUCOSE-CAPILLARY: 177 mg/dL — AB (ref 65–99)
GLUCOSE-CAPILLARY: 106 mg/dL — AB (ref 65–99)
Glucose-Capillary: 163 mg/dL — ABNORMAL HIGH (ref 65–99)
Glucose-Capillary: 117 mg/dL — ABNORMAL HIGH (ref 65–99)

## 2015-08-14 LAB — PROCALCITONIN

## 2015-08-14 NOTE — Progress Notes (Signed)
Pharmacy Antibiotic Note  Stuart Spence is a 55 y.o. male admitted on 08/10/2015 with pneumonia.  Pharmacy has been consulted for piperacillin/tazobactatm dosing.  This is day #4 of antibiotics total (and day #2 of piperacillin/tazobactam).  Plan: Continue piperacillin/tazobactam 3.375 g IV q8h EI  Height: '5\' 7"'$  (170.2 cm) Weight: 240 lb 4.8 oz (109 kg) IBW/kg (Calculated) : 66.1  Temp (24hrs), Avg:97.7 F (36.5 C), Min:97 F (36.1 C), Max:98.1 F (36.7 C)   Recent Labs Lab 08/10/15 1554 08/11/15 0320 08/12/15 0318 08/13/15 0917 08/14/15 0359  WBC 11.7* 11.1* 14.4* 15.6* 17.5*  CREATININE 1.01 1.13 1.34* 1.18 1.12    Estimated Creatinine Clearance: 88.8 mL/min (by C-G formula based on Cr of 1.12).    No Known Allergies  Antimicrobials this admission: Piperacillin/tazobactam 5/10 >>  Levofloxacin 5/7 >> 5/9  Microbiology results: 5/7 BCx: no growth x 4 days 5/7 MRSA PCR: negative   Pharmacy will continue to monitor and adjust per consult.    Lenis Noon, PharmD Clinical Pharmacist 08/14/2015 1:45 PM

## 2015-08-14 NOTE — Care Management (Signed)
Left message with wife Cameran Pettey for return call regarding financial assistance.

## 2015-08-14 NOTE — Evaluation (Signed)
Physical Therapy Evaluation Patient Details Name: Stuart Spence MRN: [id number] DOB: Apr 24, 1960 Today's Date: 08/14/2015   History of Present Illness  Pt is a 55 y.o. M admitted to hospital on 5/7 for progressing SOB and significant weight gain. Pt found to have metastatic mass in R lung with R lung collapse and R large pleural effusion. Pt has received throacentesis x2 for fluid removal since being in hospital. on 5/10 pt had surgery for L Port-A-Cath, chest tube and pleurx cath placement. Per RN, chest tube removed 5/11.    Clinical Impression  Pt is a pleasant 55 y.o. M admitted to hospital for R lung metastatic mass causing R lung collapse and pleural effusion. Prior to admission, pt lived at home with wife and daughter. Pt independent with all ADLs and did not use AD. Pt awake and oriented during evaluation. Pt demonstrated good B UE and B LE strength. Pt able to perform bed mobility with min assist. Pt able to transfer from EOB using CGA for safety. Pt able to ambulate approx 3 ft using CGA for safety and management of lines/leads. Pt performed supine there-ex with no assist. Pt on 45% HFNC t/o evaluation. Pts O2 sat dropped to 86% during there-ex in bed, increased to 88% after rest. O2 dropped to 87% during ambulation, increased to 89% after resting in chair. Pt demonstrates deficits in endurance and mobility. Pt would benefit from further skilled PT to address deficits and return to PLOF; recommend pt receive home health PT after discharge from acute hospitalization.     Follow Up Recommendations Home health PT    Equipment Recommendations       Recommendations for Other Services       Precautions / Restrictions Precautions Precautions: Fall Restrictions Weight Bearing Restrictions: No      Mobility  Bed Mobility Overal bed mobility: Needs Assistance Bed Mobility: Supine to Sit     Supine to sit: Min assist     General bed mobility comments: Pt able to perform bed mobility  with min assist and use of bed rails. Pt required assist to upright trunk. Once upright, pt able to scoot to EOB independently.   Transfers Overall transfer level: Needs assistance Equipment used: None Transfers: Sit to/from Stand Sit to Stand: Min guard         General transfer comment: Pt able to transfer from EOB with CGA for safety. Pt demonstrated safe technique with proper sequencing during transfer. Assist provided for lines/leads.   Ambulation/Gait Ambulation/Gait assistance: Min guard Ambulation Distance (Feet): 3 Feet Assistive device: None Gait Pattern/deviations: Step-through pattern Gait velocity: slow   General Gait Details: Pt able to ambulate approx 3 ft with CGA for safety. Pt demonstrated slow, step-through gait pattern. Pt provided cues regarding turning during ambulation to maintain lines/leads. Ambulation limited by pt on HFNC and O2 dropped during mobility. Pt ambulated w/o AD, however may want to try using RW for longer distances when permitted d/t endurance.   Stairs            Wheelchair Mobility    Modified Rankin (Stroke Patients Only)       Balance Overall balance assessment: Modified Independent (able to maintain sitting with use of bed, standing w/o AD)                                           Pertinent Vitals/Pain Pain Assessment: 0-10  Pain Score: 5  Pain Location: surgical site on R side Pain Descriptors / Indicators: Operative site guarding Pain Intervention(s): Limited activity within patient's tolerance    Home Living Family/patient expects to be discharged to:: Private residence Living Arrangements: Spouse/significant other;Children Available Help at Discharge: Family Type of Home: House Home Access: Stairs to enter Entrance Stairs-Rails: Left Entrance Stairs-Number of Steps: 3 Home Layout: One level Home Equipment: Walker - 4 wheels Additional Comments: Pt lives with wife and daughter who are around t/o  the day. Daughter in home-school.     Prior Function Level of Independence: Independent         Comments: Pt indepenent with all ADLs. Did not use AD for ambulation.      Hand Dominance        Extremity/Trunk Assessment   Upper Extremity Assessment: RUE deficits/detail;LUE deficits/detail RUE Deficits / Details: R UE grossly 4/5 strength, good grip strength     LUE Deficits / Details: L UE grossly 4/5 strength, good grip strength   Lower Extremity Assessment: RLE deficits/detail;LLE deficits/detail RLE Deficits / Details: R LE grossly 5/5 strength LLE Deficits / Details: L LE grossly 5/5 strength     Communication   Communication: No difficulties  Cognition Arousal/Alertness: Awake/alert Behavior During Therapy: WFL for tasks assessed/performed Overall Cognitive Status: Within Functional Limits for tasks assessed                      General Comments      Exercises Other Exercises Other Exercises: Pt performed supine ther-ex on B LE including SLR, hip ab/ad, and SAQ. All ther-ex performed x10 reps.       Assessment/Plan    PT Assessment Patient needs continued PT services  PT Diagnosis Difficulty walking;Abnormality of gait;Acute pain   PT Problem List Decreased mobility;Cardiopulmonary status limiting activity  PT Treatment Interventions Gait training;Stair training;Therapeutic exercise;Therapeutic activities   PT Goals (Current goals can be found in the Care Plan section) Acute Rehab PT Goals Patient Stated Goal: to return to PLOF PT Goal Formulation: With patient Time For Goal Achievement: 08/28/15 Potential to Achieve Goals: Good    Frequency Min 2X/week   Barriers to discharge        Co-evaluation               End of Session Equipment Utilized During Treatment: Gait belt;Oxygen Activity Tolerance: Patient tolerated treatment well Patient left: in chair;with call bell/phone within reach;with chair alarm set;with family/visitor  present Nurse Communication: Mobility status         Time: 1540-1603 PT Time Calculation (min) (ACUTE ONLY): 23 min   Charges:         PT G Codes:        Sherral Hammers 08-17-2015, 4:58 PM M. Barnett Abu, SPT

## 2015-08-14 NOTE — Progress Notes (Signed)
PULMONARY / CRITICAL CARE MEDICINE   Name: Stuart Spence MRN: [id number] DOB: 1961-03-07    ADMISSION DATE:  08/10/2015    DISCUSSION: 55 yo male presenting with possible neoplastic lung mass, total right lung collapse, large right pleural effusion and post-obstruction pneumonia; hypoxic requiring high flow [place].   ASSESSMENT / PLAN:  PULMONARY A: Acute respiratory failure 2/2 occlusive right lung mass and pleural effusion, status post thoracentesis 2 Non-small cell lung cancer of the lung. Fibrosis of the right pleura, due to primary lung cancer, malignant pleural effusion.  P:   -Patient continues to require 70% high flow. We'll continue to wean down oxygen as tolerated. -The patient's current respiratory failure, appears to be chronic due to primary lung cancer. He will likely require treatment of his lung cancer or to improve, I am somewhat doubtful that he will be able to be weaned off of the oxygen to the extent that he could be discharged home without primary treatment of his cancer.   CARDIOVASCULAR A:  H/o Hypertension P:  -Continue home BP meds  RENAL A:   Hyponatremia P:   -monitor and replace lectrolytes  INFECTIOUS A:   Post-obstructive pneumonia P:   -Levaquin. Will change to Zosyn, given continued respiratory failure, which persisted after drainage of pleural fluid. -F/U cultures -We'll check pro-calcitonin.  CULTURES: MRSA screen-negative Blood cultures X 2 05/07> negative thus far  ANTIBIOTICS: Levaquin 5/9>>5/10 Zosyn5/10>>   Endocrine A:   H/O Gout  P:   Continue allopurinol.  NEUROLOGIC A:   Depression ED P:   RASS goal: N/A -Monitor neuro status -Resume home antidepressant  SIGNIFICANT EVENTS: 05/07>Admitted with lung mass, right pleural effusion and right lung collapse 5/8>> thoracentesis of the right space with 2.4 L of bloody fluid drained. 5/9>> repeat thoracentesis of the right side with 2.3 L of bloody fluid  drained.   LINES/TUBES: PIVs   Best Practice: Code Status:  Full. Diet: asvance as tolerated.  GI prophylaxis:  n/a. VTE prophylaxis:  SCD's / lovenox  ----------  SUBJECTIVE: No acute changes overnight. He is status post right Pleurx catheter, Port-A-Cath. The patient was brought back to the ICU postoperatively yesterday. Patient is awake and alert this morning, he appears to be in good spirits.   VITAL SIGNS: BP 111/59 mmHg  Pulse 74  Temp(Src) 98 F (36.7 C) (Oral)  Resp 20  Ht '5\' 7"'$  (1.702 m)  Wt 240 lb 4.8 oz (109 kg)  BMI 37.63 kg/m2  SpO2 95%  HEMODYNAMICS:    VENTILATOR SETTINGS: Vent Mode:  [-]  FiO2 (%):  [48 %-70 %] 68 %  INTAKE / OUTPUT: I/O last 3 completed shifts: In: 2625 [P.O.:440; I.V.:1535; IV Piggyback:650] Out: 7425 [Urine:2350; Blood:50; Chest Tube:54]  PHYSICAL EXAMINATION: General: mild respiratory distress Neuro: AAO X4, no focal deficits HEENT: [place]/AT, PERRLA, oral mucosa pink, trachea midline Cardiovascular: RRR, S1/S2, no MRG Lungs:  Increased WOB, limited airflow right lung field, worse in the bases, decreased air entry in the right base. Abdomen: Non-distended, soft, normal bowel sounds Musculoskeletal: +ROM Skin: Warm and dry  LABS:  BMET  Recent Labs Lab 08/12/15 0318 08/13/15 0917 08/14/15 0359  NA 132* 133* 132*  K 4.5 4.5 4.6  CL 93* 95* 94*  CO2 31 26 33*  BUN 30* 36* 34*  CREATININE 1.34* 1.18 1.12  GLUCOSE 187* 164* 169*    Electrolytes  Recent Labs Lab 08/12/15 0318 08/13/15 0917 08/14/15 0359  CALCIUM 8.9 8.6* 8.5*    CBC  Recent Labs  Lab 08/12/15 0318 08/13/15 0917 08/14/15 0359  WBC 14.4* 15.6* 17.5*  HGB 13.2 13.2 12.5*  HCT 39.2* 39.2* 37.1*  PLT 391 377 369    Coag's  Recent Labs Lab 08/12/15 1644  APTT 27  INR 1.19    Sepsis Markers  Recent Labs Lab 08/13/15 0917 08/14/15 0359  PROCALCITON <0.10 <0.10    ABG  Recent Labs Lab 08/12/15 0408  PHART 7.47*  PCO2ART  45  PO2ART 57*    Liver Enzymes  Recent Labs Lab 08/10/15 1554 08/11/15 1106  AST 25  --   ALT 28  --   ALKPHOS 90  --   BILITOT 0.7  --   ALBUMIN 3.2* 3.2*    Cardiac Enzymes  Recent Labs Lab 08/10/15 1554  TROPONINI <0.03    Glucose  Recent Labs Lab 08/12/15 2013 08/13/15 0713 08/13/15 1152 08/13/15 1816 08/13/15 2216 08/14/15 0722  GLUCAP 111* 173* 126* 147* 226* 163*    Imaging Dg Chest Port 1 View  08/14/2015  CLINICAL DATA:  Pleural effusion EXAM: PORTABLE CHEST 1 VIEW COMPARISON:  08/13/2015 FINDINGS: Cardiac shadow remains enlarged. Right upper lobe mass lesion is again identified and stable from previous exams. There remains some pleural fluid. The overall appearance is stable from the prior study. A right chest wall port is again noted and stable. The left lung is well aerated although some minimal basilar atelectasis is seen. IMPRESSION: New minimal left basilar atelectasis. Otherwise no change from the prior exam. Electronically Signed   By: Inez Catalina M.D.   On: 08/14/2015 07:25   Dg Chest Port 1 View  08/13/2015  CLINICAL DATA:  Status post port insertion EXAM: PORTABLE CHEST 1 VIEW COMPARISON:  08/13/2015 FINDINGS: Cardiac shadow is stable. A new right-sided chest wall port is noted with the catheter tip in the mid superior vena cava. No pneumothorax is seen. Stable changes in the right lung consistent with the given clinical history are noted. IMPRESSION: No pneumothorax following port placement. The remainder of the study is stable from the previous day. Electronically Signed   By: Inez Catalina M.D.   On: 08/13/2015 16:43   Dg Chest Port 1 View  08/13/2015  CLINICAL DATA:  56 year old male recently status post right thoracentesis for large right pleural effusion. Right upper lobe mass on post thoracentesis CT. Initial encounter. EXAM: PORTABLE CHEST 1 VIEW COMPARISON:  Post thoracentesis Chest CT 08/12/2015, pre thoracentesis chest CTA 08/10/2015, and  earlier. FINDINGS: Portable AP upright view at 0755 hours. Slightly lower lung volumes. The left lung remains grossly clear. Left mediastinal contours remain normal. Masslike right suprahilar contour abnormality (arrow). Patchy and confluent residual mid and lower right lung opacity. Evidence of residual right pleural effusion also tracking into the apex. No pneumothorax. IMPRESSION: Stable compared to the post thoracentesis CT yesterday. Mass-like right suprahilar mediastinal contour abnormality. Residual right pleural effusion and lung opacity. Electronically Signed   By: Genevie Ann M.D.   On: 08/13/2015 08:16   Dg C-arm 1-60 Min-no Report  08/13/2015  CLINICAL DATA: elective surgery C-ARM 1-60 MINUTES Fluoroscopy was utilized by the requesting physician.  No radiographic interpretation.        Marda Stalker, M.D.  08/14/2015 Critical Care Attestation.  I have personally obtained a history, examined the patient, evaluated laboratory and imaging results, formulated the assessment and plan and placed orders. The Patient requires high complexity decision making for assessment and support, frequent evaluation and titration of therapies, application of advanced monitoring technologies and extensive  interpretation of multiple databases. The patient has critical illness that could lead imminently to failure of 1 or more organ systems and requires the highest level of physician preparedness to intervene.  Critical Care Time devoted to patient care services described in this note is 35 minutes and is exclusive of time spent in procedures.

## 2015-08-14 NOTE — Care Management (Addendum)
PCP is Dr. Jeananne Rama. Wife given number to Amy at the [place] office [phone]) to assist in applying for Medicaid. Provided her with an application to medication [facility] for medication assistance. Wife states that if the hospital is unable to get patient off the O2 by Monday then he will be transferred to [facility] or [facility]. Wife tearful. She has been to the social services office and they were difficult. Redirected her to [facility] financial department for Medicaid questions.

## 2015-08-14 NOTE — Progress Notes (Signed)
Patient ID: Stuart Spence, male   DOB: 05-19-1960, 55 y.o.   MRN: [id number] [facility] NAME: Stuart Spence    MR#:  [id number]  DATE OF BIRTH:  10-May-1960  SUBJECTIVE:  Remains on high flow nasal cannula.Family in the room. REVIEW OF SYSTEMS:   Review of Systems  Constitutional: Negative for fever, chills and weight loss.  HENT: Negative for ear discharge, ear pain and nosebleeds.   Eyes: Negative for blurred vision, pain and discharge.  Respiratory: Positive for shortness of breath. Negative for sputum production, wheezing and stridor.   Cardiovascular: Negative for chest pain, palpitations, orthopnea and PND.  Gastrointestinal: Negative for nausea, vomiting, abdominal pain and diarrhea.  Genitourinary: Negative for urgency and frequency.  Musculoskeletal: Negative for back pain and joint pain.  Neurological: Positive for weakness. Negative for sensory change, speech change and focal weakness.  Psychiatric/Behavioral: Negative for depression and hallucinations. The patient is not nervous/anxious.   All other systems reviewed and are negative.  Tolerating Diet:yes Tolerating PT: yes  DRUG ALLERGIES:  No Known Allergies  VITALS:  Blood pressure 148/60, pulse 80, temperature 98.1 F (36.7 C), temperature source Oral, resp. rate 23, height '5\' 7"'$  (1.702 m), weight 109 kg (240 lb 4.8 oz), SpO2 99 %.  PHYSICAL EXAMINATION:   Physical Exam  GENERAL:  55 y.o.-year-old patient lying in the bed with no acute distress.  EYES: Pupils equal, round, reactive to light and accommodation. No scleral icterus. Extraocular muscles intact.  HEENT: Head atraumatic, normocephalic. Oropharynx and nasopharynx clear. High flow nasal cannula oxygen NECK:  Supple, no jugular venous distention. No thyroid enlargement, no tenderness.  LUNGSDecreased breath sounds bilaterally more on the right than left. breath sounds bilaterally, no wheezing,  rales, rhonchi. No use of accessory muscles of respiration. pleurx catheter+ CARDIOVASCULAR: S1, S2 normal. No murmurs, rubs, or gallops.  ABDOMEN: Soft, nontender, nondistended. Bowel sounds present. No organomegaly or mass.  EXTREMITIES: No cyanosis, clubbing or edema b/l.    NEUROLOGIC: Cranial nerves II through XII are intact. No focal Motor or sensory deficits b/l.   PSYCHIATRIC:  patient is alert and oriented x 3.  SKIN: No obvious rash, lesion, or ulcer.   LABORATORY PANEL:  CBC  Recent Labs Lab 08/14/15 0359  WBC 17.5*  HGB 12.5*  HCT 37.1*  PLT 369    Chemistries   Recent Labs Lab 08/10/15 1554  08/14/15 0359  NA 132*  < > 132*  K 3.7  < > 4.6  CL 95*  < > 94*  CO2 27  < > 33*  GLUCOSE 154*  < > 169*  BUN 15  < > 34*  CREATININE 1.01  < > 1.12  CALCIUM 8.6*  < > 8.5*  AST 25  --   --   ALT 28  --   --   ALKPHOS 90  --   --   BILITOT 0.7  --   --   < > = values in this interval not displayed. Cardiac Enzymes  Recent Labs Lab 08/10/15 1554  TROPONINI <0.03   RADIOLOGY:  Dg Chest Port 1 View  08/14/2015  CLINICAL DATA:  Pleural effusion EXAM: PORTABLE CHEST 1 VIEW COMPARISON:  08/13/2015 FINDINGS: Cardiac shadow remains enlarged. Right upper lobe mass lesion is again identified and stable from previous exams. There remains some pleural fluid. The overall appearance is stable from the prior study. A right chest wall port is again noted and stable. The  left lung is well aerated although some minimal basilar atelectasis is seen. IMPRESSION: New minimal left basilar atelectasis. Otherwise no change from the prior exam. Electronically Signed   By: Stuart Spence M.D.   On: 08/14/2015 07:25   Dg Chest Port 1 View  08/13/2015  CLINICAL DATA:  Status post port insertion EXAM: PORTABLE CHEST 1 VIEW COMPARISON:  08/13/2015 FINDINGS: Cardiac shadow is stable. A new right-sided chest wall port is noted with the catheter tip in the mid superior vena cava. No pneumothorax is  seen. Stable changes in the right lung consistent with the given clinical history are noted. IMPRESSION: No pneumothorax following port placement. The remainder of the study is stable from the previous day. Electronically Signed   By: Stuart Spence M.D.   On: 08/13/2015 16:43   Dg Chest Port 1 View  08/13/2015  CLINICAL DATA:  55 year old male recently status post right thoracentesis for large right pleural effusion. Right upper lobe mass on post thoracentesis CT. Initial encounter. EXAM: PORTABLE CHEST 1 VIEW COMPARISON:  Post thoracentesis Chest CT 08/12/2015, pre thoracentesis chest CTA 08/10/2015, and earlier. FINDINGS: Portable AP upright view at 0755 hours. Slightly lower lung volumes. The left lung remains grossly clear. Left mediastinal contours remain normal. Masslike right suprahilar contour abnormality (arrow). Patchy and confluent residual mid and lower right lung opacity. Evidence of residual right pleural effusion also tracking into the apex. No pneumothorax. IMPRESSION: Stable compared to the post thoracentesis CT yesterday. Mass-like right suprahilar mediastinal contour abnormality. Residual right pleural effusion and lung opacity. Electronically Signed   By: Stuart Spence M.D.   On: 08/13/2015 08:16   Dg C-arm 1-60 Min-no Report  08/13/2015  CLINICAL DATA: elective surgery C-ARM 1-60 MINUTES Fluoroscopy was utilized by the requesting physician.  No radiographic interpretation.   ASSESSMENT AND PLAN:  Stuart Spence is a 55 y.o. male with a known history of Essential hypertension who is presenting with shortness of breath. He describes having progressive shortness of breath originally dyspnea on exertion but now even at rest for about 1 month. He went to an outside facility about a week ago given symptoms of nonproductive cough and shortness of breath pleuritic chest pain at that time had a chest x-ray and CAT scan performed which revealed a right lung mass.  * Right lung mass with right lung  collhelp.apse and Malignant pleural effusion /acute hypoxic respiratory failure Pulmonology and cardiothoracic with Dr. 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 -On Levaquin due to concern for postobstructive pneumonia. -Ps/p Pleurx catheter placement along with Port-A-Cath placement. -Patient getting radiological workup for carcinoma staging. Await Dr. 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  * Hypertension Continue Norvasc and benazepril.  * DVT prophylaxis with Lovenox  spoke with family i the room  *PT to be started D/c IVF and foley  Case discussed with Care Management/Social Worker. Management plans discussed with the patient, family and they are in agreement.  CODE STATUS:full  DVT Prophylaxis: lovenox  TOTAL criticalTIME TAKING CARE OF THIS PATIENT:35 minutes.  >50% time spent on counselling and coordination of care   D/C DEPENDING ON CLINICAL CONDITION.  Note: This dictation was prepared with Dragon dictation along with smaller phrase technology. Any transcriptional errors that result from this process are unintentional.  Kemesha Mosey M.D on 08/14/2015 at 2:09 PM  Between 7am to 6pm - Pager - [phone]  After 6pm go to [email or web address] - password [email or web address] [facility]  Office  [phone]  CC: Primary care physician; Golden Pop, MD

## 2015-08-14 NOTE — Progress Notes (Signed)
Stuart Spence Inpatient Post-Op Note  Patient ID: Stuart Spence, male   DOB: [birth year], 55 y.o.   MRN: [id number]  HISTORY: Hungry this morning.  Wants to get out of bed and walk    Filed Vitals:   08/14/15 0700 08/14/15 0800  BP: 111/58 114/60  Pulse: 72 75  Temp:  98.1 F (36.7 C)  Resp: 18 17     EXAM: Resp: Lungs with rhonchi on right.  No respiratory distress, normal effort. Heart:  Regular without murmurs Abd:  Abdomen is soft, non distended and non tender. No masses are palpable.  There is no rebound and no guarding.  Neurological: Alert and oriented to person, place, and time. Coordination normal.  Skin: Skin is warm and dry. No rash noted. No diaphoretic. No erythema. No pallor.  Psychiatric: Normal mood and affect. Normal behavior. Judgment and thought content normal.    ASSESSMENT: Minimal drainage from PleurX.  Wounds clean and dry   PLAN:   Discontinue suction on PleurX.   Remove dressings tomorrow    Nestor Lewandowsky, MD

## 2015-08-15 ENCOUNTER — Inpatient Hospital Stay: Payer: No Typology Code available for payment source

## 2015-08-15 LAB — CULTURE, BLOOD (ROUTINE X 2)
CULTURE: NO GROWTH
CULTURE: NO GROWTH

## 2015-08-15 LAB — COMPREHENSIVE METABOLIC PANEL
ALK PHOS: 101 U/L (ref 38–126)
ALT: 24 U/L (ref 17–63)
ANION GAP: 7 (ref 5–15)
AST: 18 U/L (ref 15–41)
Albumin: 2.7 g/dL — ABNORMAL LOW (ref 3.5–5.0)
BILIRUBIN TOTAL: 0.6 mg/dL (ref 0.3–1.2)
BUN: 33 mg/dL — ABNORMAL HIGH (ref 6–20)
CALCIUM: 8.6 mg/dL — AB (ref 8.9–10.3)
CO2: 32 mmol/L (ref 22–32)
Chloride: 96 mmol/L — ABNORMAL LOW (ref 101–111)
Creatinine, Ser: 1.15 mg/dL (ref 0.61–1.24)
GFR calc non Af Amer: >60 mL/min (ref >60)
GLUCOSE: 111 mg/dL — AB (ref 65–99)
Potassium: 4.5 mmol/L (ref 3.5–5.1)
Sodium: 135 mmol/L (ref 135–145)
TOTAL PROTEIN: 6.3 g/dL — AB (ref 6.5–8.1)

## 2015-08-15 LAB — CBC
HEMATOCRIT: 37.8 % — AB (ref 40.0–52.0)
HEMOGLOBIN: 12.6 g/dL — AB (ref 13.0–18.0)
MCH: 30.5 pg (ref 26.0–34.0)
MCHC: 33.4 g/dL (ref 32.0–36.0)
MCV: 91.3 fL (ref 80.0–100.0)
Platelets: 335 10*3/uL (ref 150–440)
RBC: 4.14 MIL/uL — ABNORMAL LOW (ref 4.40–5.90)
RDW: 13 % (ref 11.5–14.5)
WBC: 13.7 10*3/uL — ABNORMAL HIGH (ref 3.8–10.6)

## 2015-08-15 LAB — GLUCOSE, CAPILLARY
GLUCOSE-CAPILLARY: 81 mg/dL (ref 65–99)
GLUCOSE-CAPILLARY: 126 mg/dL — AB (ref 65–99)
Glucose-Capillary: 138 mg/dL — ABNORMAL HIGH (ref 65–99)
Glucose-Capillary: 158 mg/dL — ABNORMAL HIGH (ref 65–99)

## 2015-08-15 LAB — PROCALCITONIN: Procalcitonin: <0.1 ng/mL

## 2015-08-15 LAB — PHOSPHORUS: Phosphorus: 3.2 mg/dL (ref 2.5–4.6)

## 2015-08-15 MED ORDER — POLYETHYLENE GLYCOL 3350 17 G PO PACK
17.0000 g | PACK | Freq: Two times a day (BID) | ORAL | Status: DC
  Administered 2015-08-15: 17 g via ORAL
  Filled 2015-08-15 (×4): qty 1

## 2015-08-15 NOTE — Progress Notes (Signed)
PULMONARY / CRITICAL CARE MEDICINE   Name: Stuart Spence MRN: [id number] DOB: 1961/01/08    ADMISSION DATE:  08/10/2015    DISCUSSION: 55 yo male presenting with possible neoplastic lung mass, total right lung collapse, large right pleural effusion and post-obstruction pneumonia; hypoxic requiring high flow [place].  Oxygen requirement reduced to 40% hi flow today.   ASSESSMENT / PLAN:  PULMONARY A: Acute respiratory failure 2/2 occlusive right lung mass and pleural effusion, status post thoracentesis 2 Preliminary report positive for adeno CA.  P:   -Patient continues to require 40% high flow. We'll continue to wean down oxygen as tolerated. Thoracic surgery has placed a Port-A-Cath, as well as Pleurx catheter. -Nebulized bronchodilator --Discussed with oncology, will continue to wean down hi-flow to the point where the patient can be treated outpatient and undergo further staging.    CARDIOVASCULAR A:  H/o Hypertension P:  -Continue home BP meds  RENAL A:   Hyponatremia P:   -monitor and replace lectrolytes  INFECTIOUS A:   Post-obstructive pneumonia P:   -Continue Zosyn, given continued respiratory failure, which persisted after drainage of pleural fluid. -F/U cultures -We'll check pro-calcitonin.  CULTURES: MRSA screen-negative Blood cultures X 2 05/07>  ANTIBIOTICS: Levaquin 5/9>>5/10 Zosyn5/10   MS A:   H/O Gout  P:   Continue start allopurinol.  NEUROLOGIC A:   Depression ED P:    -Monitor neuro status -Resumed home antidepressant  SIGNIFICANT EVENTS: 05/07>Admitted with lung mass, right pleural effusion and right lung collapse 5/8>> thoracentesis of the right space with 2.4 L of bloody fluid drained. 5/9>> repeat thoracentesis of the right side with 2.3 L of bloody fluid drained.   LINES/TUBES: PIVs   Best Practice: Code Status:  Full. Diet: asvance as tolerated.  GI prophylaxis:  n/a. VTE prophylaxis:  SCD's /  heparin  ----------  SUBJECTIVE: Patient is awake and alert this morning, he complains of mild dyspnea, which is better since yesterday.   VITAL SIGNS: BP 125/69 mmHg  Pulse 72  Temp(Src) 98.6 F (37 C) (Oral)  Resp 16  Ht '5\' 7"'$  (1.702 m)  Wt 240 lb 4.8 oz (109 kg)  BMI 37.63 kg/m2  SpO2 94%  HEMODYNAMICS:    VENTILATOR SETTINGS: Vent Mode:  [-]  FiO2 (%):  [40 %-50 %] 40 %  INTAKE / OUTPUT: I/O last 3 completed shifts: In: 2065 [P.O.:880; I.V.:935; IV Piggyback:250] Out: 2322 [Urine:2300; Chest Tube:22]  PHYSICAL EXAMINATION: General: mild respiratory distress Neuro: AAO X4, no focal deficits HEENT: [place]/AT, PERRLA, oral mucosa pink, trachea midline Cardiovascular: RRR, S1/S2, no MRG Lungs:  Increased WOB, limited airflow right lung field, worse in the bases, Abdomen: Non-distended, soft, normal bowel sounds Musculoskeletal: +ROM Skin: Warm and dry  LABS:  BMET  Recent Labs Lab 08/13/15 0917 08/14/15 0359 08/15/15 0328  NA 133* 132* 135  K 4.5 4.6 4.5  CL 95* 94* 96*  CO2 26 33* 32  BUN 36* 34* 33*  CREATININE 1.18 1.12 1.15  GLUCOSE 164* 169* 111*    Electrolytes  Recent Labs Lab 08/13/15 0917 08/14/15 0359 08/15/15 0328  CALCIUM 8.6* 8.5* 8.6*  PHOS  --   --  3.2    CBC  Recent Labs Lab 08/13/15 0917 08/14/15 0359 08/15/15 0328  WBC 15.6* 17.5* 13.7*  HGB 13.2 12.5* 12.6*  HCT 39.2* 37.1* 37.8*  PLT 377 369 335    Coag's  Recent Labs Lab 08/12/15 1644  APTT 27  INR 1.19    Sepsis Markers  Recent  Labs Lab 08/13/15 0917 08/14/15 0359 08/15/15 0328  PROCALCITON <0.10 <0.10 <0.10    ABG  Recent Labs Lab 08/12/15 0408  PHART 7.47*  PCO2ART 45  PO2ART 57*    Liver Enzymes  Recent Labs Lab 08/10/15 1554 08/11/15 1106 08/15/15 0328  AST 25  --  18  ALT 28  --  24  ALKPHOS 90  --  101  BILITOT 0.7  --  0.6  ALBUMIN 3.2* 3.2* 2.7*    Cardiac Enzymes  Recent Labs Lab 08/10/15 1554  TROPONINI <0.03     Glucose  Recent Labs Lab 08/13/15 2216 08/14/15 0722 08/14/15 1104 08/14/15 1616 08/14/15 2137 08/15/15 0733  GLUCAP 226* 163* 177* 117* 106* 81    Imaging Dg Chest 1 View  08/15/2015  CLINICAL DATA:  Shortness of Breath EXAM: CHEST 1 VIEW COMPARISON:  Aug 14, 2015 FINDINGS: Port-A-Cath tip is in the superior vena cava. No pneumothorax. There is airspace consolidation throughout much the right mid and lower lung zones, stable. The mass in the medial right upper lobe is stable with volume loss this area. On the left, there is mild left base atelectasis. Left lung otherwise is clear. The heart size is upper normal with pulmonary vascularity within normal limits. No bone lesions evident. IMPRESSION: Persistent medial left upper lobe mass with volume loss. Areas of effusion and patchy consolidation on the right elsewhere stable. There is mild left base atelectasis. No new opacity on either side. No change cardiac silhouette. No pneumothorax. Electronically Signed   By: Lowella Grip III M.D.   On: 08/15/2015 08:00        -Marda Stalker, M.D.  08/15/2015 Critical Care Attestation.  I have personally obtained a history, examined the patient, evaluated laboratory and imaging results, formulated the assessment and plan and placed orders. The Patient requires high complexity decision making for assessment and support, frequent evaluation and titration of therapies, application of advanced monitoring technologies and extensive interpretation of multiple databases. The patient has critical illness that could lead imminently to failure of 1 or more organ systems and requires the highest level of physician preparedness to intervene.  Critical Care Time devoted to patient care services described in this note is 35 minutes and is exclusive of time spent in procedures.

## 2015-08-15 NOTE — Progress Notes (Signed)
Pharmacy Antibiotic Note  Stuart Spence is a 55 y.o. male admitted on 08/10/2015 with possible postobstructive pneumonia.  Pharmacy has been consulted for piperacillin/tazobactam dosing.  This is day #5 of antibiotics total (and day #3 of piperacillin/tazobactam). Per discussion with MD, will treat with a total of 7 days of antibiotics. Stop date entered for 5/14 PM.  Plan: Continue piperacillin/tazobactam 3.375 g IV q8h EI  Height: '5\' 7"'$  (170.2 cm) Weight: 240 lb 4.8 oz (109 kg) IBW/kg (Calculated) : 66.1  Temp (24hrs), Avg:98.8 F (37.1 C), Min:98.5 F (36.9 C), Max:99.4 F (37.4 C)   Recent Labs Lab 08/11/15 0320 08/12/15 0318 08/13/15 0917 08/14/15 0359 08/15/15 0328  WBC 11.1* 14.4* 15.6* 17.5* 13.7*  CREATININE 1.13 1.34* 1.18 1.12 1.15    Estimated Creatinine Clearance: 86.5 mL/min (by C-G formula based on Cr of 1.15).    No Known Allergies  Antimicrobials this admission: Piperacillin/tazobactam 5/10 >>  Levofloxacin 5/7 >> 5/9  Microbiology results: 5/7 BCx: no growth x 4 days 5/7 MRSA PCR: negative   Pharmacy will continue to monitor and adjust per consult.    Lenis Noon, PharmD Clinical Pharmacist 08/15/2015 2:14 PM

## 2015-08-15 NOTE — Progress Notes (Signed)
Physical Therapy Treatment Patient Details Name: Stuart Spence MRN: [id number] DOB: 21-Jan-1961 Today's Date: 08/15/2015    History of Present Illness Pt is a 55 y.o. M admitted to hospital on 5/7 for progressing SOB and significant weight gain. Pt found to have metastatic mass in R lung with R lung collapse and R large pleural effusion. Pt has received throacentesis x2 for fluid removal since being in hospital. on 5/10 pt had surgery for L Port-A-Cath, chest tube and pleurx cath placement. Per RN, chest tube removed 5/11.      PT Comments    Pt is progressing towards goals. Pt able to perform bed mobility with modified independence. Pt able to transfer from EOB with CGA for safety. Pt able to ambulate in room approx 70 ft. with CGA for safety. Pt performed supine and seated there-ex with no assist. Pt on HFNC 38 L/min and O2 monitored t/o all mobility. O2 sat dropped to 93% during ambulation, increased to 95% after resting. Pt demonstrates deficits in endurance and mobility. Pt would benefit from further skilled PT to address deficits; recommend pt receive home health PT after discharge from acute hospitalization.   Follow Up Recommendations  Home health PT     Equipment Recommendations       Recommendations for Other Services       Precautions / Restrictions Precautions Precautions: Fall Restrictions Weight Bearing Restrictions: No    Mobility  Bed Mobility Overal bed mobility: Modified Independent Bed Mobility: Supine to Sit     Supine to sit: Supervision     General bed mobility comments: Pt able to perform bed mobility with modified independence and supervision and assist with lines/leads. Pt required use of bed rails and increased time to sit on EOB. Pt able to scoot self to EOB.  Transfers Overall transfer level: Needs assistance Equipment used: None Transfers: Sit to/from Stand Sit to Stand: Min guard         General transfer comment: Pt able to transfer from EOB  with CGA for safety. Pt demonstrates safe technique and proper sequencing when standing. Assist provided for lines/leads.  Ambulation/Gait Ambulation/Gait assistance: Min guard Ambulation Distance (Feet): 70 Feet Assistive device: None Gait Pattern/deviations: Step-through pattern Gait velocity: increased   General Gait Details: Pt able to ambulate approx 70 ft in room with CGA and assist for lines/leads. Pt demonstrated step-through gait pattern with normal gait speed. Pt able to change direction easily with no LOB. Ambulation limited by pts fatigue.    Stairs            Wheelchair Mobility    Modified Rankin (Stroke Patients Only)       Balance                                    Cognition Arousal/Alertness: Awake/alert Behavior During Therapy: WFL for tasks assessed/performed Overall Cognitive Status: Within Functional Limits for tasks assessed                      Exercises Other Exercises Other Exercises: Pt performed supine ther-ex on B LE including ankle pumps, SLR, and SAQ. In sitting, pt performed LAQ and marching in place. All ther-ex performed with no assist and x12 reps.     General Comments        Pertinent Vitals/Pain Pain Assessment: No/denies pain    Home Living  Prior Function            PT Goals (current goals can now be found in the care plan section) Acute Rehab PT Goals Patient Stated Goal: to return to PLOF PT Goal Formulation: With patient Time For Goal Achievement: 08/28/15 Potential to Achieve Goals: Good Progress towards PT goals: Progressing toward goals    Frequency  Min 2X/week    PT Plan Current plan remains appropriate    Co-evaluation             End of Session Equipment Utilized During Treatment: Gait belt;Oxygen Activity Tolerance: Patient tolerated treatment well Patient left: in bed;with call bell/phone within reach;with SCD's reapplied     Time:  1000-1023 PT Time Calculation (min) (ACUTE ONLY): 23 min  Charges:                       G Codes:      Sherral Hammers 05-Sep-2015, 12:45 PM M. Barnett Abu, SPT

## 2015-08-15 NOTE — Progress Notes (Signed)
Patient ID: Stuart Spence, male   DOB: November 10, 1960, 55 y.o.   MRN: [id number] [address] [facility] [place] NAME: Stuart Spence    MR#:  [id number]  DATE OF BIRTH:  [birth year]  SUBJECTIVE:  Remains on high flow nasal cannula.Family in the room. Patient currently on 40% FiO2. Slowly weaning down. He feels better. REVIEW OF SYSTEMS:   Review of Systems  Constitutional: Negative for fever, chills and weight loss.  HENT: Negative for ear discharge, ear pain and nosebleeds.   Eyes: Negative for blurred vision, pain and discharge.  Respiratory: Positive for shortness of breath. Negative for sputum production, wheezing and stridor.   Cardiovascular: Negative for chest pain, palpitations, orthopnea and PND.  Gastrointestinal: Negative for nausea, vomiting, abdominal pain and diarrhea.  Genitourinary: Negative for urgency and frequency.  Musculoskeletal: Negative for back pain and joint pain.  Neurological: Positive for weakness. Negative for sensory change, speech change and focal weakness.  Psychiatric/Behavioral: Negative for depression and hallucinations. The patient is not nervous/anxious.   All other systems reviewed and are negative.  Tolerating Diet:yes Tolerating PT: yes  DRUG ALLERGIES:  No Known Allergies  VITALS:  Blood pressure 125/69, pulse 72, temperature 98.6 F (37 C), temperature source Oral, resp. rate 16, height '5\' 7"'$  (1.702 m), weight 109 kg (240 lb 4.8 oz), SpO2 95 %.  PHYSICAL EXAMINATION:   Physical Exam  GENERAL:  55 y.o.-year-old patient lying in the bed with no acute distress.  EYES: Pupils equal, round, reactive to light and accommodation. No scleral icterus. Extraocular muscles intact.  HEENT: Head atraumatic, normocephalic. Oropharynx and nasopharynx clear. High flow nasal cannula oxygen NECK:  Supple, no jugular venous distention. No thyroid enlargement, no tenderness.  LUNGSDecreased breath sounds bilaterally more  on the right than left. breath sounds bilaterally, no wheezing, rales, rhonchi. No use of accessory muscles of respiration. pleurx catheter+ CARDIOVASCULAR: S1, S2 normal. No murmurs, rubs, or gallops.  ABDOMEN: Soft, nontender, nondistended. Bowel sounds present. No organomegaly or mass.  EXTREMITIES: No cyanosis, clubbing or edema b/l.    NEUROLOGIC: Cranial nerves II through XII are intact. No focal Motor or sensory deficits b/l.   PSYCHIATRIC:  patient is alert and oriented x 3.  SKIN: No obvious rash, lesion, or ulcer.   LABORATORY PANEL:  CBC  Recent Labs Lab 08/15/15 0328  WBC 13.7*  HGB 12.6*  HCT 37.8*  PLT 335    Chemistries   Recent Labs Lab 08/15/15 0328  NA 135  K 4.5  CL 96*  CO2 32  GLUCOSE 111*  BUN 33*  CREATININE 1.15  CALCIUM 8.6*  AST 18  ALT 24  ALKPHOS 101  BILITOT 0.6   Cardiac Enzymes  Recent Labs Lab 08/10/15 1554  TROPONINI <0.03   RADIOLOGY:  Dg Chest 1 View  08/15/2015  CLINICAL DATA:  Shortness of Breath EXAM: CHEST 1 VIEW COMPARISON:  Aug 14, 2015 FINDINGS: Port-A-Cath tip is in the superior vena cava. No pneumothorax. There is airspace consolidation throughout much the right mid and lower lung zones, stable. The mass in the medial right upper lobe is stable with volume loss this area. On the left, there is mild left base atelectasis. Left lung otherwise is clear. The heart size is upper normal with pulmonary vascularity within normal limits. No bone lesions evident. IMPRESSION: Persistent medial left upper lobe mass with volume loss. Areas of effusion and patchy consolidation on the right elsewhere stable. There is mild left base atelectasis. No  new opacity on either side. No change cardiac silhouette. No pneumothorax. Electronically Signed   By: Lowella Grip III M.D.   On: 08/15/2015 08:00   Dg Chest Port 1 View  08/14/2015  CLINICAL DATA:  Pleural effusion EXAM: PORTABLE CHEST 1 VIEW COMPARISON:  08/13/2015 FINDINGS: Cardiac  shadow remains enlarged. Right upper lobe mass lesion is again identified and stable from previous exams. There remains some pleural fluid. The overall appearance is stable from the prior study. A right chest wall port is again noted and stable. The left lung is well aerated although some minimal basilar atelectasis is seen. IMPRESSION: New minimal left basilar atelectasis. Otherwise no change from the prior exam. Electronically Signed   By: Inez Catalina M.D.   On: 08/14/2015 07:25   Dg Chest Port 1 View  08/13/2015  CLINICAL DATA:  Status post port insertion EXAM: PORTABLE CHEST 1 VIEW COMPARISON:  08/13/2015 FINDINGS: Cardiac shadow is stable. A new right-sided chest wall port is noted with the catheter tip in the mid superior vena cava. No pneumothorax is seen. Stable changes in the right lung consistent with the given clinical history are noted. IMPRESSION: No pneumothorax following port placement. The remainder of the study is stable from the previous day. Electronically Signed   By: Inez Catalina M.D.   On: 08/13/2015 16:43   Dg C-arm 1-60 Min-no Report  08/13/2015  CLINICAL DATA: elective surgery C-ARM 1-60 MINUTES Fluoroscopy was utilized by the requesting physician.  No radiographic interpretation.   ASSESSMENT AND PLAN:  Stuart Spence is a 55 y.o. male with a known history of Essential hypertension who is presenting with shortness of breath. He describes having progressive shortness of breath originally dyspnea on exertion but now even at rest for about 1 month. He went to an outside facility about a week ago given symptoms of nonproductive cough and shortness of breath pleuritic chest pain at that time had a chest x-ray and CAT scan performed which revealed a right lung mass.  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 Genevive Bi and oncologyconsulted- appreciate help -Thoracentesis right-sided 2 removed ~5 L   -preliminary pleural fluid cytopathology results reveal adenocarcinoma - Presently is on high flow nasal cannula and critically ill---Wean to nasal cannula as tolerated. -On Zosyn IV due to concern for postobstructive pneumonia. -Ps/p Pleurx catheter placement along with Port-A-Cath placement. -Patient getting radiological workup for carcinoma staging. -Appreciate oncology recommendations. 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  * Hypertension Continue Norvasc and benazepril.  * DVT prophylaxis with Lovenox  spoke with family i the room  *PT to be started DC'd foley  Case discussed with Care Management/Social Worker. Management plans discussed with the patient, family and they are in agreement.  CODE STATUS:full  DVT Prophylaxis: lovenox  TOTAL TIME TAKING CARE OF THIS PATIENT: 25 minutes.  >50% time spent on counselling and coordination of care   D/C DEPENDING ON CLINICAL CONDITION.  Note: This dictation was prepared with Dragon dictation along with smaller phrase technology. Any transcriptional errors that result from this process are unintentional.  Tejal Monroy M.D on 08/15/2015 at 12:47 PM  Between 7am to 6pm - Pager - [phone]  After 6pm go to [email or web address] - password [email or web address] [facility]  Office  [phone]  CC: Primary care physician; Golden Pop, MD

## 2015-08-15 NOTE — Progress Notes (Signed)
[facility] Hematology/Oncology Progress Note  Date of admission: 08/10/2015  Hospital day:  08/14/2015  Chief Complaint: Stuart Spence is a 55 y.o. male who was admitted with shortness of breath.  HPI:  This patient is a 55 year old nonsmoking gentleman who presented with a month history of increasing shortness of breath and fatigue.  He initially felt that he had a cold.  Last week, he went to [facility] where his shortness of breath worsened.  He was seen locally.  CXR revealed a large right-sided pleural effusion and chest CT revealed a mass.   He presented to the ER via ambulance with increasing shortness of breath.  Chest CT angiogram on 08/10/2015 revealed and ill-defined 4.5 x 4.5 cm area of hypodensity in the right upper lobe with small areas of air bronchogram. This was incompletely characterized but is concerning for a centrally obstructing mass.  There was complete occlusion of the right upper, right middle, and right lower lobe bronchi with complete collapse of the right lung.  There was a large right pleural effusion.  There was a lytic lesion involving the right fourth rib most compatible with metastatic disease.  There was no CT evidence of pulmonary embolism.  He underwent thoracentesis of 2.4 liters of brown serous fluid on 08/11/2015 and 2.3 liters of bloody fluid on 08/12/2015.  Cytology revealed adenocarcinoma.   Despite thoracentesis, he remained short of breath with oxygen saturations in the high 80s and low 90s.  Follow-up chest CT on 08/12/2015 revealed a persistent right upper lobe mass concerning for bronchogenic carcinoma. The mass is along the apical segmental bronchi.  There is a malignant right fourth rib lesions that is likely metastatic.  There is atelectasis and re-expansion edema on the right.  He underwent right Pleurx catheter and port-a-cath insertion on 08/13/2015.  The lung was adherent to the underside of the ribs in multiple locations. Post  procedure, his oxygen needs increased.  This morning, he was on 70% high flow.  Per Dr. Ashby Dawes, there has been concern that we will not be able to wean off oxygen to allow discharge home and would require inpatient treatment.  The etiology of his increased oxygen needs is felt secondary to his pleural disease rather than post-obstructive changes caused by the mass.  Scans were reviewed with radiology and radiation oncology after tumor board today.  He is not a candidate for radiation at [facility] secondary to his high flow oxygen needs.  He is on Zosyn for a post-obstructive pneumonia.  Subjective:  Patient notes shortness of breath, slightly improved.  Remains on high flow oxygen.  Social History: He denies smoking.  He has worked 41 years in the [employer].  He notes exposure to chemicals.The patient is accompanied by his wife, sister, daughter, and sister-in-law today.  Allergies: No Known Allergies  Scheduled Medications: . allopurinol  300 mg Oral Daily  . amLODipine  10 mg Oral Daily  . benazepril  40 mg Oral Daily  . FLUoxetine  40 mg Oral Daily  . insulin aspart  0-15 Units Subcutaneous TID WC  . insulin aspart  0-5 Units Subcutaneous QHS  . ipratropium-albuterol  3 mL Nebulization Q4H  . piperacillin-tazobactam (ZOSYN)  IV  3.375 g Intravenous Q8H  . senna-docusate  1 tablet Oral BID    Review of Systems: GENERAL:  Fatigue.  No fevers.  Sweats at nigh for 1 month.  No weight loss.  Baseline weight 235-240 pounds. PERFORMANCE STATUS (ECOG):  2-3 HEENT:  No  visual changes, runny nose, sore throat, mouth sores or tenderness. Lungs: Shortness of breath.  Non-productive cough.  No hemoptysis. Cardiac:  No chest pain, palpitations, orthopnea, or PND. GI:  Constipation.  No nausea, vomiting, diarrhea, melena or hematochezia.  No prior colonoscopy. GU:  No urgency, frequency, dysuria, or hematuria. Musculoskeletal:  No back pain.  No joint pain.  No muscle  tenderness. Extremities:  No pain or swelling. Skin:  No rashes or skin changes. Neuro:  No headache, numbness or weakness, balance or coordination issues. Endocrine:  No diabetes, thyroid issues, hot flashes or night sweats. Psych:  No mood changes, depression or anxiety. Pain:  No focal pain. Review of systems:  All other systems reviewed and found to be negative.  Physical Exam: Blood pressure 114/46, pulse 72, temperature 98.5 F (36.9 C), temperature source Oral, resp. rate 16, height _0  (1.702 m), weight 240 lb 4.8 oz (109 kg), SpO2 93 %.  GENERAL:  Well developed, well nourished, heavyset gentleman sitting comfortably in ICU in no acute distress. MENTAL STATUS:  Alert and oriented to person, place and time. HEAD:  Dark hair.  Stuart Spence.  Normocephalic, atraumatic, face symmetric, no Cushingoid features. EYES: Glasses.  Pupils equal round and reactive to light and accomodation.  No conjunctivitis or scleral icterus. ENT:  High flow nasal cannula.  Oropharynx clear without lesion.  Tongue normal. Mucous membranes moist.  RESPIRATORY:  Decreased breath sounds right base.  No wheezes or rhonchi.  Rigth sided Pleurx catheter. CARDIOVASCULAR:  Regular rate and rhythm without murmur, rub or gallop. ABDOMEN:  Soft, non-tender, with active bowel sounds, and no hepatosplenomegaly.  No masses. SKIN:  No rashes, ulcers or lesions. EXTREMITIES: No edema, no skin discoloration or tenderness.  No palpable cords. LYMPH NODES: No palpable cervical, supraclavicular, axillary or inguinal adenopathy  NEUROLOGICAL: Unremarkable. PSYCH:  Appropriate.  Results for orders placed or performed during the hospital encounter of 08/10/15 (from the past 48 hour(s))  Glucose, capillary     Status: Abnormal   Collection Time: 08/13/15  7:13 AM  Result Value Ref Range   Glucose-Capillary 173 (H) 65 - 99 mg/dL  Procalcitonin - Baseline     Status: None   Collection Time: 08/13/15  9:17 AM  Result Value  Ref Range   Procalcitonin <0.10 ng/mL    Comment:        Interpretation: PCT (Procalcitonin) <= 0.5 ng/mL: Systemic infection (sepsis) is not likely. Local bacterial infection is possible. (NOTE)         ICU PCT Algorithm               Non ICU PCT Algorithm    ----------------------------     ------------------------------         PCT < 0.25 ng/mL                 PCT < 0.1 ng/mL     Stopping of antibiotics            Stopping of antibiotics       strongly encouraged.               strongly encouraged.    ----------------------------     ------------------------------       PCT level decrease by               PCT < 0.25 ng/mL       >= 80% from peak PCT       OR PCT 0.25 - 0.5 ng/mL  Stopping of antibiotics                                             encouraged.     Stopping of antibiotics           encouraged.    ----------------------------     ------------------------------       PCT level decrease by              PCT >= 0.25 ng/mL       < 80% from peak PCT        AND PCT >= 0.5 ng/mL            Continuin g antibiotics                                              encouraged.       Continuing antibiotics            encouraged.    ----------------------------     ------------------------------     PCT level increase compared          PCT > 0.5 ng/mL         with peak PCT AND          PCT >= 0.5 ng/mL             Escalation of antibiotics                                          strongly encouraged.      Escalation of antibiotics        strongly encouraged.   CBC     Status: Abnormal   Collection Time: 08/13/15  9:17 AM  Result Value Ref Range   WBC 15.6 (H) 3.8 - 10.6 K/uL   RBC 4.37 (L) 4.40 - 5.90 MIL/uL   Hemoglobin 13.2 13.0 - 18.0 g/dL   HCT 39.2 (L) 40.0 - 52.0 %   MCV 89.8 80.0 - 100.0 fL   MCH 30.2 26.0 - 34.0 pg   MCHC 33.7 32.0 - 36.0 g/dL   RDW 13.0 11.5 - 14.5 %   Platelets 377 150 - 440 K/uL  Basic metabolic panel     Status: Abnormal   Collection  Time: 08/13/15  9:17 AM  Result Value Ref Range   Sodium 133 (L) 135 - 145 mmol/L   Potassium 4.5 3.5 - 5.1 mmol/L   Chloride 95 (L) 101 - 111 mmol/L   CO2 26 22 - 32 mmol/L   Glucose, Bld 164 (H) 65 - 99 mg/dL   BUN 36 (H) 6 - 20 mg/dL   Creatinine, Ser 1.18 0.61 - 1.24 mg/dL   Calcium 8.6 (L) 8.9 - 10.3 mg/dL   GFR calc non Af Amer >60 >60 mL/min   GFR calc Af Amer >60 >60 mL/min    Comment: (NOTE) The eGFR has been calculated using the CKD EPI equation. This calculation has not been validated in all clinical situations. eGFR's persistently <60 mL/min signify possible Chronic Kidney Disease.    Anion gap 12 5 - 15  Glucose, capillary     Status: Abnormal  Collection Time: 08/13/15 11:52 AM  Result Value Ref Range   Glucose-Capillary 126 (H) 65 - 99 mg/dL  Glucose, capillary     Status: Abnormal   Collection Time: 08/13/15  6:16 PM  Result Value Ref Range   Glucose-Capillary 147 (H) 65 - 99 mg/dL  Glucose, capillary     Status: Abnormal   Collection Time: 08/13/15 10:16 PM  Result Value Ref Range   Glucose-Capillary 226 (H) 65 - 99 mg/dL  Procalcitonin     Status: None   Collection Time: 08/14/15  3:59 AM  Result Value Ref Range   Procalcitonin <0.10 ng/mL    Comment:        Interpretation: PCT (Procalcitonin) <= 0.5 ng/mL: Systemic infection (sepsis) is not likely. Local bacterial infection is possible. (NOTE)         ICU PCT Algorithm               Non ICU PCT Algorithm    ----------------------------     ------------------------------         PCT < 0.25 ng/mL                 PCT < 0.1 ng/mL     Stopping of antibiotics            Stopping of antibiotics       strongly encouraged.               strongly encouraged.    ----------------------------     ------------------------------       PCT level decrease by               PCT < 0.25 ng/mL       >= 80% from peak PCT       OR PCT 0.25 - 0.5 ng/mL          Stopping of antibiotics                                              encouraged.     Stopping of antibiotics           encouraged.    ----------------------------     ------------------------------       PCT level decrease by              PCT >= 0.25 ng/mL       < 80% from peak PCT        AND PCT >= 0.5 ng/mL            Continuin g antibiotics                                              encouraged.       Continuing antibiotics            encouraged.    ----------------------------     ------------------------------     PCT level increase compared          PCT > 0.5 ng/mL         with peak PCT AND          PCT >= 0.5 ng/mL             Escalation of antibiotics  strongly encouraged.      Escalation of antibiotics        strongly encouraged.   CBC     Status: Abnormal   Collection Time: 08/14/15  3:59 AM  Result Value Ref Range   WBC 17.5 (H) 3.8 - 10.6 K/uL   RBC 4.11 (L) 4.40 - 5.90 MIL/uL   Hemoglobin 12.5 (L) 13.0 - 18.0 g/dL   HCT 37.1 (L) 40.0 - 52.0 %   MCV 90.3 80.0 - 100.0 fL   MCH 30.4 26.0 - 34.0 pg   MCHC 33.7 32.0 - 36.0 g/dL   RDW 13.0 11.5 - 14.5 %   Platelets 369 150 - 440 K/uL  Basic metabolic panel     Status: Abnormal   Collection Time: 08/14/15  3:59 AM  Result Value Ref Range   Sodium 132 (L) 135 - 145 mmol/L   Potassium 4.6 3.5 - 5.1 mmol/L   Chloride 94 (L) 101 - 111 mmol/L   CO2 33 (H) 22 - 32 mmol/L   Glucose, Bld 169 (H) 65 - 99 mg/dL   BUN 34 (H) 6 - 20 mg/dL   Creatinine, Ser 1.12 0.61 - 1.24 mg/dL   Calcium 8.5 (L) 8.9 - 10.3 mg/dL   GFR calc non Af Amer >60 >60 mL/min   GFR calc Af Amer >60 >60 mL/min    Comment: (NOTE) The eGFR has been calculated using the CKD EPI equation. This calculation has not been validated in all clinical situations. eGFR's persistently <60 mL/min signify possible Chronic Kidney Disease.    Anion gap 5 5 - 15  Glucose, capillary     Status: Abnormal   Collection Time: 08/14/15  7:22 AM  Result Value Ref Range   Glucose-Capillary 163  (H) 65 - 99 mg/dL  Glucose, capillary     Status: Abnormal   Collection Time: 08/14/15 11:04 AM  Result Value Ref Range   Glucose-Capillary 177 (H) 65 - 99 mg/dL  Glucose, capillary     Status: Abnormal   Collection Time: 08/14/15  4:16 PM  Result Value Ref Range   Glucose-Capillary 117 (H) 65 - 99 mg/dL  Glucose, capillary     Status: Abnormal   Collection Time: 08/14/15  9:37 PM  Result Value Ref Range   Glucose-Capillary 106 (H) 65 - 99 mg/dL   Dg Chest Port 1 View  08/14/2015  CLINICAL DATA:  Pleural effusion EXAM: PORTABLE CHEST 1 VIEW COMPARISON:  08/13/2015 FINDINGS: Cardiac shadow remains enlarged. Right upper lobe mass lesion is again identified and stable from previous exams. There remains some pleural fluid. The overall appearance is stable from the prior study. A right chest wall port is again noted and stable. The left lung is well aerated although some minimal basilar atelectasis is seen. IMPRESSION: New minimal left basilar atelectasis. Otherwise no change from the prior exam. Electronically Signed   By: Inez Catalina M.D.   On: 08/14/2015 07:25   Dg Chest Port 1 View  08/13/2015  CLINICAL DATA:  Status post port insertion EXAM: PORTABLE CHEST 1 VIEW COMPARISON:  08/13/2015 FINDINGS: Cardiac shadow is stable. A new right-sided chest wall port is noted with the catheter tip in the mid superior vena cava. No pneumothorax is seen. Stable changes in the right lung consistent with the given clinical history are noted. IMPRESSION: No pneumothorax following port placement. The remainder of the study is stable from the previous day. Electronically Signed   By: Inez Catalina M.D.   On: 08/13/2015 16:43  Dg Chest Port 1 View  08/13/2015  CLINICAL DATA:  55 year old male recently status post right thoracentesis for large right pleural effusion. Right upper lobe mass on post thoracentesis CT. Initial encounter. EXAM: PORTABLE CHEST 1 VIEW COMPARISON:  Post thoracentesis Chest CT 08/12/2015,  pre thoracentesis chest CTA 08/10/2015, and earlier. FINDINGS: Portable AP upright view at 0755 hours. Slightly lower lung volumes. The left lung remains grossly clear. Left mediastinal contours remain normal. Masslike right suprahilar contour abnormality (arrow). Patchy and confluent residual mid and lower right lung opacity. Evidence of residual right pleural effusion also tracking into the apex. No pneumothorax. IMPRESSION: Stable compared to the post thoracentesis CT yesterday. Mass-like right suprahilar mediastinal contour abnormality. Residual right pleural effusion and lung opacity. Electronically Signed   By: Genevie Ann M.D.   On: 08/13/2015 08:16   Dg C-arm 1-60 Min-no Report  08/13/2015  CLINICAL DATA: elective surgery C-ARM 1-60 MINUTES Fluoroscopy was utilized by the requesting physician.  No radiographic interpretation.    Assessment:  Stuart Spence is a 55 y.o. male with no smoking history presenting with a large right sided pleural effusion and an ill-defined 4.5 x 4.5 cm mass in the right upper lobe.  Thoracentesis x 2 of approximately 4.7 liters of blood fluid revealed adenocarcinoma.  He is s/p Pleurx catheter without significant improvement respiratory improvement.  He remains on high flow oxygen.  The etiology of his increased oxygen needs is felt secondary to his pleural disease rather than post-obstructive changes caused by the mass.  Scans were reviewed with radiology and radiation oncology after tumor board today.  He is not a candidate for radiation to relieve a post-obstructive process at [facility] secondary to his high flow oxygen needs.    Plan: 1.  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  Discuss staging studies typically done (abdomen and pelvic CT scan or PET scan and head imaging).  Discuss typical work-up and treatment in outpatient department, but limited by oxygen needs.  Discuss standard  chemotherapy, response rates and response time.  Discuss additional testing on malignant cells to confirm lung origin (TTF1) and testing for driver mutations (turn around time 10 days).  Discuss potential radiation to post-obstructive mass (not a candidate at this hospital per Dr. Baruch Gouty).   2.  Phone follow-up with Dr. Delorse Lek, pathologist- done.  Testing for TTF1. 3.  Testing for EGFR, ALK, ROS1, PDL-1. 4.  If proven to be metastatic adenocarcinoma of the lung, patient would be a good candidate for the recently FDA approved combination of pembrolizumab (Keytruda) with Alimta and carboplatin based on the KEYNOTE-021 trial.  Response rates were 55% compared to 29% with chemotherapy alone.  Progression free survival was 13 months with the addition of Keytruda versus 8.9 months with chemotherapy alone.  Median time to response was 1.5 months with Keytruda compared to 2.7 months with chemotherapy alone.  Thank you for allowing me to participate in Mr. Hills's care.  I will follow him closely with you while hospitalized and after discharge to the outpatient department.   Lequita Asal, MD  08/14/2015

## 2015-08-15 NOTE — Progress Notes (Signed)
fio2 decreased to 40%

## 2015-08-16 DIAGNOSIS — J9600 Acute respiratory failure, unspecified whether with hypoxia or hypercapnia: Secondary | ICD-10-CM

## 2015-08-16 DIAGNOSIS — R630 Anorexia: Secondary | ICD-10-CM

## 2015-08-16 LAB — BASIC METABOLIC PANEL
Anion gap: 7 (ref 5–15)
BUN: 22 mg/dL — AB (ref 6–20)
CALCIUM: 8.3 mg/dL — AB (ref 8.9–10.3)
CHLORIDE: 99 mmol/L — AB (ref 101–111)
CO2: 28 mmol/L (ref 22–32)
CREATININE: 1.05 mg/dL (ref 0.61–1.24)
GFR calc Af Amer: >60 mL/min (ref >60)
Glucose, Bld: 118 mg/dL — ABNORMAL HIGH (ref 65–99)
Potassium: 4.4 mmol/L (ref 3.5–5.1)
SODIUM: 134 mmol/L — AB (ref 135–145)

## 2015-08-16 LAB — CBC
HCT: 39.5 % — ABNORMAL LOW (ref 40.0–52.0)
Hemoglobin: 13.1 g/dL (ref 13.0–18.0)
MCH: 30.5 pg (ref 26.0–34.0)
MCHC: 33.1 g/dL (ref 32.0–36.0)
MCV: 92.2 fL (ref 80.0–100.0)
PLATELETS: 290 10*3/uL (ref 150–440)
RBC: 4.29 MIL/uL — ABNORMAL LOW (ref 4.40–5.90)
RDW: 13 % (ref 11.5–14.5)
WBC: 11.8 10*3/uL — ABNORMAL HIGH (ref 3.8–10.6)

## 2015-08-16 LAB — GLUCOSE, CAPILLARY
GLUCOSE-CAPILLARY: 120 mg/dL — AB (ref 65–99)
Glucose-Capillary: 153 mg/dL — ABNORMAL HIGH (ref 65–99)
Glucose-Capillary: 173 mg/dL — ABNORMAL HIGH (ref 65–99)
Glucose-Capillary: 159 mg/dL — ABNORMAL HIGH (ref 65–99)
Glucose-Capillary: 116 mg/dL — ABNORMAL HIGH (ref 65–99)

## 2015-08-16 MED ORDER — LEVOFLOXACIN 500 MG PO TABS
500.0000 mg | ORAL_TABLET | Freq: Every day | ORAL | Status: DC
  Administered 2015-08-16: 14:00:00 500 mg via ORAL
  Filled 2015-08-16: qty 1

## 2015-08-16 MED ORDER — DIPHENHYDRAMINE HCL 25 MG PO CAPS
25.0000 mg | ORAL_CAPSULE | Freq: Every evening | ORAL | Status: DC | PRN
  Administered 2015-08-16: 25 mg via ORAL
  Filled 2015-08-16: qty 1

## 2015-08-16 MED ORDER — TRAMADOL HCL 50 MG PO TABS: 50.0000 mg | ORAL_TABLET | Freq: Four times a day (QID) | ORAL | Status: DC | PRN

## 2015-08-16 MED ORDER — CYANOCOBALAMIN 1000 MCG/ML IJ SOLN
1000.0000 ug | Freq: Once | INTRAMUSCULAR | Status: AC
  Administered 2015-08-16: 1000 ug via INTRAMUSCULAR
  Filled 2015-08-16: qty 1

## 2015-08-16 MED ORDER — FOLIC ACID 1 MG PO TABS
1.0000 mg | ORAL_TABLET | Freq: Every day | ORAL | Status: DC
  Administered 2015-08-16 – 2015-08-17 (×2): 1 mg via ORAL
  Filled 2015-08-16 (×2): qty 1

## 2015-08-16 MED ORDER — IPRATROPIUM-ALBUTEROL 0.5-2.5 (3) MG/3ML IN SOLN
3.0000 mL | Freq: Four times a day (QID) | RESPIRATORY_TRACT | Status: DC
  Administered 2015-08-16 – 2015-08-17 (×6): 3 mL via RESPIRATORY_TRACT
  Filled 2015-08-16 (×6): qty 3

## 2015-08-16 NOTE — Progress Notes (Signed)
Patient ID: Stuart Spence, male   DOB: 1960/09/22, 55 y.o.   MRN: [id number] [place] [facility] [place] NAME: Stuart Spence    MR#:  [id number]  DATE OF BIRTH:  10-17-1960  SUBJECTIVE:  Patient out in the chair eating breakfast. Patient's aunt in the room. Questions answered. Now on 4 L nasal oxygen sats 94-97%. Feels a lot better no fever. REVIEW OF SYSTEMS:   Review of Systems  Constitutional: Negative for fever, chills and weight loss.  HENT: Negative for ear discharge, ear pain and nosebleeds.   Eyes: Negative for blurred vision, pain and discharge.  Respiratory: Positive for shortness of breath. Negative for sputum production, wheezing and stridor.   Cardiovascular: Negative for chest pain, palpitations, orthopnea and PND.  Gastrointestinal: Negative for nausea, vomiting, abdominal pain and diarrhea.  Genitourinary: Negative for urgency and frequency.  Musculoskeletal: Negative for back pain and joint pain.  Neurological: Positive for weakness. Negative for sensory change, speech change and focal weakness.  Psychiatric/Behavioral: Negative for depression and hallucinations. The patient is not nervous/anxious.   All other systems reviewed and are negative.  Tolerating Diet:yes Tolerating PT: yes  DRUG ALLERGIES:  No Known Allergies  VITALS:  Blood pressure 123/56, pulse 79, temperature 97.9 F (36.6 C), temperature source Oral, resp. rate 20, height '5\' 7"'$  (1.702 m), weight 109 kg (240 lb 4.8 oz), SpO2 94 %.  PHYSICAL EXAMINATION:   Physical Exam  GENERAL:  55 y.o.-year-old patient lying in the bed with no acute distress.  EYES: Pupils equal, round, reactive to light and accommodation. No scleral icterus. Extraocular muscles intact.  HEENT: Head atraumatic, normocephalic. Oropharynx and nasopharynx clear. High flow nasal cannula oxygen NECK:  Supple, no jugular venous distention. No thyroid enlargement, no tenderness.   LUNGSDecreased breath sounds bilaterally more on the right than left. breath sounds bilaterally, no wheezing, rales, rhonchi. No use of accessory muscles of respiration. pleurx catheter+ CARDIOVASCULAR: S1, S2 normal. No murmurs, rubs, or gallops.  ABDOMEN: Soft, nontender, nondistended. Bowel sounds present. No organomegaly or mass.  EXTREMITIES: No cyanosis, clubbing or edema b/l.    NEUROLOGIC: Cranial nerves II through XII are intact. No focal Motor or sensory deficits b/l.   PSYCHIATRIC:  patient is alert and oriented x 3.  SKIN: No obvious rash, lesion, or ulcer.   LABORATORY PANEL:  CBC  Recent Labs Lab 08/16/15 0536  WBC 11.8*  HGB 13.1  HCT 39.5*  PLT 290    Chemistries   Recent Labs Lab 08/15/15 0328 08/16/15 0536  NA 135 134*  K 4.5 4.4  CL 96* 99*  CO2 32 28  GLUCOSE 111* 118*  BUN 33* 22*  CREATININE 1.15 1.05  CALCIUM 8.6* 8.3*  AST 18  --   ALT 24  --   ALKPHOS 101  --   BILITOT 0.6  --    Cardiac Enzymes  Recent Labs Lab 08/10/15 1554  TROPONINI <0.03   RADIOLOGY:  Dg Chest 1 View  08/15/2015  CLINICAL DATA:  Shortness of Breath EXAM: CHEST 1 VIEW COMPARISON:  Aug 14, 2015 FINDINGS: Port-A-Cath tip is in the superior vena cava. No pneumothorax. There is airspace consolidation throughout much the right mid and lower lung zones, stable. The mass in the medial right upper lobe is stable with volume loss this area. On the left, there is mild left base atelectasis. Left lung otherwise is clear. The heart size is upper normal with pulmonary vascularity within normal limits. No bone  lesions evident. IMPRESSION: Persistent medial left upper lobe mass with volume loss. Areas of effusion and patchy consolidation on the right elsewhere stable. There is mild left base atelectasis. No new opacity on either side. No change cardiac silhouette. No pneumothorax. Electronically Signed   By: Lowella Grip III M.D.   On: 08/15/2015 08:00   ASSESSMENT AND PLAN:   Stuart Spence is a 55 y.o. male with a known history of Essential hypertension who is presenting with shortness of breath. He describes having progressive shortness of breath originally dyspnea on exertion but now even at rest for about 1 month. He went to an outside facility about a week ago given symptoms of nonproductive cough and shortness of breath pleuritic chest pain at that time had a chest x-ray and CAT scan performed which revealed a right lung mass.  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 Genevive Bi and oncologyconsulted- appreciate help -Thoracentesis right-sided 2 removed ~5 L  -preliminary pleural fluid cytopathology results reveal adenocarcinoma - Presently is on high flow nasal cannula and critically ill---Weaned to nasal cannula now sats 94-97% on 4 L. Patient will need home oxygen -On Zosyn IV due to concern for postobstructive pneumonia. -Ps/p Pleurx catheter placement along with Port-A-Cath placement. -Patient getting radiological workup for carcinoma staging. -Appreciate oncology recommendations. 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  * Hypertension Continue Norvasc and benazepril.  * DVT prophylaxis with Lovenox  spoke with family i the room  *PT to be started [place] foley  Transfer to medical floor. If continues to show improvement with discharge to home tomorrow with outpatient follow-up oncology.  Case discussed with Care Management/Social Worker. Management plans discussed with the patient, family and they are in agreement.  CODE STATUS:full  DVT Prophylaxis: lovenox  TOTAL TIME TAKING CARE OF THIS PATIENT: 25 minutes.  >50% time spent  on counselling and coordination of care   D/C DEPENDING ON CLINICAL CONDITION.  Note: This dictation was prepared with Dragon dictation along with smaller phrase technology. Any transcriptional errors that result from this process are unintentional.  Dezire Turk M.D on 08/16/2015 at 11:55 AM  Between 7am to 6pm - Pager - [phone]  After 6pm go to [email or web address] - password [email or web address] [facility]  Office  [phone]  CC: Primary care physician; Golden Pop, MD

## 2015-08-16 NOTE — Progress Notes (Signed)
PULMONARY / CRITICAL CARE MEDICINE   Name: Stuart Spence MRN: [id number] DOB: June 08, 1960    ADMISSION DATE:  08/10/2015    DISCUSSION: 55 yo male presenting with possible neoplastic lung mass, total right lung collapse, large right pleural effusion and post-obstruction pneumonia; hypoxic but no longer requiring high flow [place].  Oxygen requirement reduced to 4 L today.   ASSESSMENT / PLAN:  PULMONARY A: Acute respiratory failure 2/2 occlusive right lung mass and pleural effusion, status post thoracentesis 2 Preliminary report positive for adeno CA.  P:   -Respiratory status has improved, we'll continue to wean down oxygen as tolerated. Thoracic surgery has placed a Port-A-Cath, as well as Pleurx catheter. -Nebulized bronchodilator    CARDIOVASCULAR A:  H/o Hypertension P:  -Continue home BP meds  RENAL A:   Hyponatremia P:   -monitor and replace lectrolytes  INFECTIOUS A:   Post-obstructive pneumonia P:   -Continue Zosyn, given continued respiratory failure, which persisted after drainage of pleural fluid. -F/U cultures -Follow  pro-calcitonin.  CULTURES: MRSA screen-negative Blood cultures X 2 05/07>  ANTIBIOTICS: Levaquin 5/9>>5/10 Zosyn5/10   MS A:   H/O Gout  P:   Continue  allopurinol.  NEUROLOGIC A:   Depression ED P:    -Monitor neuro status -Resumed home antidepressant  SIGNIFICANT EVENTS: 05/07>Admitted with lung mass, right pleural effusion and right lung collapse 5/8>> thoracentesis of the right space with 2.4 L of bloody fluid drained. 5/9>> repeat thoracentesis of the right side with 2.3 L of bloody fluid drained.   LINES/TUBES: PIVs   Best Practice: Code Status:  Full. Diet: asvance as tolerated.  GI prophylaxis:  n/a. VTE prophylaxis:  SCD's / heparin  ----------  SUBJECTIVE: Patient is awake and alert this morning, he complains of mild dyspnea, which is better since yesterday.   VITAL SIGNS: BP 132/72 mmHg  Pulse 80   Temp(Src) 97.6 F (36.4 C) (Oral)  Resp 17  Ht '5\' 7"'$  (1.702 m)  Wt 240 lb 4.8 oz (109 kg)  BMI 37.63 kg/m2  SpO2 96%  HEMODYNAMICS:    VENTILATOR SETTINGS: Vent Mode:  [-]  FiO2 (%):  [36 %-40 %] 36 %  INTAKE / OUTPUT: I/O last 3 completed shifts: In: 490 [P.O.:240; IV Piggyback:250] Out: 1720 [Urine:1720]  PHYSICAL EXAMINATION: General: mild respiratory distress Neuro: AAO X4, no focal deficits HEENT: [place]/AT, PERRLA, oral mucosa pink, trachea midline Cardiovascular: RRR, S1/S2, no MRG Lungs:  Increased WOB, limited airflow right lung field, worse in the bases, Abdomen: Non-distended, soft, normal bowel sounds Musculoskeletal: +ROM Skin: Warm and dry  LABS:  BMET  Recent Labs Lab 08/14/15 0359 08/15/15 0328 08/16/15 0536  NA 132* 135 134*  K 4.6 4.5 4.4  CL 94* 96* 99*  CO2 33* 32 28  BUN 34* 33* 22*  CREATININE 1.12 1.15 1.05  GLUCOSE 169* 111* 118*    Electrolytes  Recent Labs Lab 08/14/15 0359 08/15/15 0328 08/16/15 0536  CALCIUM 8.5* 8.6* 8.3*  PHOS  --  3.2  --     CBC  Recent Labs Lab 08/14/15 0359 08/15/15 0328 08/16/15 0536  WBC 17.5* 13.7* 11.8*  HGB 12.5* 12.6* 13.1  HCT 37.1* 37.8* 39.5*  PLT 369 335 290    Coag's  Recent Labs Lab 08/12/15 1644  APTT 27  INR 1.19    Sepsis Markers  Recent Labs Lab 08/13/15 0917 08/14/15 0359 08/15/15 0328  PROCALCITON <0.10 <0.10 <0.10    ABG  Recent Labs Lab 08/12/15 0408  PHART 7.47*  PCO2ART 45  PO2ART 57*    Liver Enzymes  Recent Labs Lab 08/10/15 1554 08/11/15 1106 08/15/15 0328  AST 25  --  18  ALT 28  --  24  ALKPHOS 90  --  101  BILITOT 0.7  --  0.6  ALBUMIN 3.2* 3.2* 2.7*    Cardiac Enzymes  Recent Labs Lab 08/10/15 1554  TROPONINI <0.03    Glucose  Recent Labs Lab 08/14/15 2137 08/15/15 0733 08/15/15 1140 08/15/15 1628 08/15/15 2222 08/16/15 0709  GLUCAP 106* 81 126* 138* 158* 120*    Imaging No results found.      Marda Stalker, M.D.  08/16/2015 Critical Care Attestation.  I have personally obtained a history, examined the patient, evaluated laboratory and imaging results, formulated the assessment and plan and placed orders. The Patient requires high complexity decision making for assessment and support, frequent evaluation and titration of therapies, application of advanced monitoring technologies and extensive interpretation of multiple databases. The patient has critical illness that could lead imminently to failure of 1 or more organ systems and requires the highest level of physician preparedness to intervene.  Critical Care Time devoted to patient care services described in this note is 35 minutes and is exclusive of time spent in procedures.

## 2015-08-16 NOTE — Progress Notes (Signed)
Covering for Dr. Genevive Bi s/p Pleurx and port. Doing well Sitting up eating breakfast  Chest: pleurx in place, no infection, incision from port healing well   A/P doing well Continue pleurx Poor prognosis

## 2015-08-16 NOTE — Progress Notes (Signed)
[facility] Hematology/Oncology Progress Note  Date of admission: 08/10/2015  Hospital day:  08/15/2015  Chief Complaint: Stuart Spence is a 55 y.o. male who was admitted with shortness of breath.  Subjective:  Patient feels much better today.  High flow oxygen has been switched to nasal cannula.  No pleural fluid removed from Pleurx today.  Social History: He denies smoking.  He has worked 21 years in the [employer].  He notes exposure to chemicals.The patient is accompanied by his wife today.  Allergies: No Known Allergies  Scheduled Medications: . allopurinol  300 mg Oral Daily  . amLODipine  10 mg Oral Daily  . benazepril  40 mg Oral Daily  . FLUoxetine  40 mg Oral Daily  . insulin aspart  0-15 Units Subcutaneous TID WC  . insulin aspart  0-5 Units Subcutaneous QHS  . ipratropium-albuterol  3 mL Nebulization QID  . piperacillin-tazobactam (ZOSYN)  IV  3.375 g Intravenous Q8H  . polyethylene glycol  17 g Oral BID  . senna-docusate  1 tablet Oral BID    Review of Systems: GENERAL:  Fatigue.  No fevers.  Sweats at night for 1 month.  No weight loss.  Baseline weight 235-240 pounds. PERFORMANCE STATUS (ECOG):  2-3 HEENT:  No visual changes, runny nose, sore throat, mouth sores or tenderness. Lungs: Shortness of breath, improved.  Non-productive cough.  No hemoptysis. Cardiac:  No chest pain, palpitations, orthopnea, or PND. GI:  No nausea, vomiting, diarrhea, constipation, melena or hematochezia.  No prior colonoscopy. GU:  No urgency, frequency, dysuria, or hematuria. Musculoskeletal:  No back pain.  No joint pain.  No muscle tenderness. Extremities:  No pain or swelling. Skin:  No rashes or skin changes. Neuro:  No headache, numbness or weakness, balance or coordination issues. Endocrine:  No diabetes, thyroid issues, hot flashes or night sweats. Psych:  No mood changes, depression or anxiety. Pain:  No focal pain. Review of systems:  All other  systems reviewed and found to be negative.  Physical Exam: Blood pressure 134/79, pulse 82, temperature 97.6 F (36.4 C), temperature source Oral, resp. rate 17, height _0  (1.702 m), weight 240 lb 4.8 oz (109 kg), SpO2 94 %.  GENERAL:  Well developed, well nourished, heavyset gentleman sitting comfortably in ICU in no acute distress. MENTAL STATUS:  Alert and oriented to person, place and time. HEAD:  Dark hair.  Stuart Spence.  Normocephalic, atraumatic, face symmetric, no Cushingoid features. EYES: Glasses.  Pupils equal round and reactive to light and accomodation.  No conjunctivitis or scleral icterus. ENT:  Nasal cannula in place.  Oropharynx clear without lesion.  Tongue normal. Mucous membranes moist.  RESPIRATORY:  Decreased breath sounds right base.  No wheezes or rhonchi.  Right sided Pleurx catheter. CARDIOVASCULAR:  Regular rate and rhythm without murmur, rub or gallop. ABDOMEN:  Soft, non-tender, with active bowel sounds, and no hepatosplenomegaly.  No masses. SKIN:  No rashes, ulcers or lesions. EXTREMITIES: No edema, no skin discoloration or tenderness.  No palpable cords. LYMPH NODES: No palpable cervical, supraclavicular, axillary or inguinal adenopathy  NEUROLOGICAL: Unremarkable. PSYCH:  Appropriate.  Results for orders placed or performed during the hospital encounter of 08/10/15 (from the past 48 hour(s))  Glucose, capillary     Status: Abnormal   Collection Time: 08/14/15  7:22 AM  Result Value Ref Range   Glucose-Capillary 163 (H) 65 - 99 mg/dL  Glucose, capillary     Status: Abnormal   Collection Time: 08/14/15 11:04  AM  Result Value Ref Range   Glucose-Capillary 177 (H) 65 - 99 mg/dL  Glucose, capillary     Status: Abnormal   Collection Time: 08/14/15  4:16 PM  Result Value Ref Range   Glucose-Capillary 117 (H) 65 - 99 mg/dL  Glucose, capillary     Status: Abnormal   Collection Time: 08/14/15  9:37 PM  Result Value Ref Range   Glucose-Capillary 106 (H) 65  - 99 mg/dL  Procalcitonin     Status: None   Collection Time: 08/15/15  3:28 AM  Result Value Ref Range   Procalcitonin <0.10 ng/mL    Comment:        Interpretation: PCT (Procalcitonin) <= 0.5 ng/mL: Systemic infection (sepsis) is not likely. Local bacterial infection is possible. (NOTE)         ICU PCT Algorithm               Non ICU PCT Algorithm    ----------------------------     ------------------------------         PCT < 0.25 ng/mL                 PCT < 0.1 ng/mL     Stopping of antibiotics            Stopping of antibiotics       strongly encouraged.               strongly encouraged.    ----------------------------     ------------------------------       PCT level decrease by               PCT < 0.25 ng/mL       >= 80% from peak PCT       OR PCT 0.25 - 0.5 ng/mL          Stopping of antibiotics                                             encouraged.     Stopping of antibiotics           encouraged.    ----------------------------     ------------------------------       PCT level decrease by              PCT >= 0.25 ng/mL       < 80% from peak PCT        AND PCT >= 0.5 ng/mL            Continuin g antibiotics                                              encouraged.       Continuing antibiotics            encouraged.    ----------------------------     ------------------------------     PCT level increase compared          PCT > 0.5 ng/mL         with peak PCT AND          PCT >= 0.5 ng/mL             Escalation of antibiotics  strongly encouraged.      Escalation of antibiotics        strongly encouraged.   CBC     Status: Abnormal   Collection Time: 08/15/15  3:28 AM  Result Value Ref Range   WBC 13.7 (H) 3.8 - 10.6 K/uL   RBC 4.14 (L) 4.40 - 5.90 MIL/uL   Hemoglobin 12.6 (L) 13.0 - 18.0 g/dL   HCT 37.8 (L) 40.0 - 52.0 %   MCV 91.3 80.0 - 100.0 fL   MCH 30.5 26.0 - 34.0 pg   MCHC 33.4 32.0 - 36.0 g/dL   RDW 13.0 11.5  - 14.5 %   Platelets 335 150 - 440 K/uL  Comprehensive metabolic panel     Status: Abnormal   Collection Time: 08/15/15  3:28 AM  Result Value Ref Range   Sodium 135 135 - 145 mmol/L   Potassium 4.5 3.5 - 5.1 mmol/L   Chloride 96 (L) 101 - 111 mmol/L   CO2 32 22 - 32 mmol/L   Glucose, Bld 111 (H) 65 - 99 mg/dL   BUN 33 (H) 6 - 20 mg/dL   Creatinine, Ser 1.15 0.61 - 1.24 mg/dL   Calcium 8.6 (L) 8.9 - 10.3 mg/dL   Total Protein 6.3 (L) 6.5 - 8.1 g/dL   Albumin 2.7 (L) 3.5 - 5.0 g/dL   AST 18 15 - 41 U/L   ALT 24 17 - 63 U/L   Alkaline Phosphatase 101 38 - 126 U/L   Total Bilirubin 0.6 0.3 - 1.2 mg/dL   GFR calc non Af Amer >60 >60 mL/min   GFR calc Af Amer >60 >60 mL/min    Comment: (NOTE) The eGFR has been calculated using the CKD EPI equation. This calculation has not been validated in all clinical situations. eGFR's persistently <60 mL/min signify possible Chronic Kidney Disease.    Anion gap 7 5 - 15  Phosphorus     Status: None   Collection Time: 08/15/15  3:28 AM  Result Value Ref Range   Phosphorus 3.2 2.5 - 4.6 mg/dL  Glucose, capillary     Status: None   Collection Time: 08/15/15  7:33 AM  Result Value Ref Range   Glucose-Capillary 81 65 - 99 mg/dL  Glucose, capillary     Status: Abnormal   Collection Time: 08/15/15 11:40 AM  Result Value Ref Range   Glucose-Capillary 126 (H) 65 - 99 mg/dL  Glucose, capillary     Status: Abnormal   Collection Time: 08/15/15  4:28 PM  Result Value Ref Range   Glucose-Capillary 138 (H) 65 - 99 mg/dL  Glucose, capillary     Status: Abnormal   Collection Time: 08/15/15 10:22 PM  Result Value Ref Range   Glucose-Capillary 158 (H) 65 - 99 mg/dL   Dg Chest 1 View  08/15/2015  CLINICAL DATA:  Shortness of Breath EXAM: CHEST 1 VIEW COMPARISON:  Aug 14, 2015 FINDINGS: Port-A-Cath tip is in the superior vena cava. No pneumothorax. There is airspace consolidation throughout much the right mid and lower lung zones, stable. The mass in the  medial right upper lobe is stable with volume loss this area. On the left, there is mild left base atelectasis. Left lung otherwise is clear. The heart size is upper normal with pulmonary vascularity within normal limits. No bone lesions evident. IMPRESSION: Persistent medial left upper lobe mass with volume loss. Areas of effusion and patchy consolidation on the right elsewhere stable. There is mild left base atelectasis. No new  opacity on either side. No change cardiac silhouette. No pneumothorax. Electronically Signed   By: Lowella Grip III M.D.   On: 08/15/2015 08:00   Dg Chest Port 1 View  08/14/2015  CLINICAL DATA:  Pleural effusion EXAM: PORTABLE CHEST 1 VIEW COMPARISON:  08/13/2015 FINDINGS: Cardiac shadow remains enlarged. Right upper lobe mass lesion is again identified and stable from previous exams. There remains some pleural fluid. The overall appearance is stable from the prior study. A right chest wall port is again noted and stable. The left lung is well aerated although some minimal basilar atelectasis is seen. IMPRESSION: New minimal left basilar atelectasis. Otherwise no change from the prior exam. Electronically Signed   By: Inez Catalina M.D.   On: 08/14/2015 07:25    Assessment:  Stuart Spence is a 55 y.o. male with no smoking history presenting with a large right sided pleural effusion and an ill-defined 4.5 x 4.5 cm mass in the right upper lobe.  Thoracentesis x 2 of approximately 4.7 liters of blood fluid revealed adenocarcinoma.  He is s/p Pleurx catheter without significant improvement respiratory improvement.  He remains on high flow oxygen.  The etiology of his increased oxygen needs is felt secondary to his pleural disease rather than post-obstructive changes caused by the mass.  Scans were reviewed with radiology and radiation oncology after tumor board.  He was not a candidate for radiation to relieve a post-obstructive process at [facility] when he was on high flow oxygen.     Plan: 1.  Discussed with patient and his wife today good improvement with ability to wean his oxygen down to nasal cannula.  He had been told that if he did not improve by Monday, 08/18/2015, that he would be transferred to a tertiary care facility.  Discussed awaiting testing from pleural fluid for TTF1 (confirming lung primary).  Anticipate staging in outpatient department (PET and head imaging).  Discussed outpatient chemotherapy class.  2.  Discussed in more detail plans for treatment with pembrolizumab Beryle Flock) with carboplatin and Alimta based on the KEYNOTE-021 trial. Side effects of treatment reviewed in detail.  Discussed additional testing (EGFR, ALK, ROS1, PDL-1) pending.  Patient agreeable to current treatment plan.  Discussed initiation of B12 (once every 9 weeks) and folic acid (1 mg a day) in anticipation of Alimta (typically started 1 week before treatment).  Multiple questions asked and answered.   Thank you for allowing me to participate in Mr. Hills's care.  I will follow him closely with you while hospitalized and after discharge to the outpatient department.   Lequita Asal, MD  08/15/2015

## 2015-08-16 NOTE — Progress Notes (Signed)
Stuart Spence   DOB:[birth year]   HQ#:[id number]    Subjective: Patient noted to have improvement in his breathing last 1-2 days. Is currently on 2 L of nasal cannula. Currently off high flow nasal oxygen. He is able to sit in the chair for a few hours every day. Coughing is improving. No fevers.  Review of system: No diarrhea or constipation. Appetite is poor.   Objective:  Filed Vitals:   08/16/15 0900 08/16/15 1000  BP: 116/68 110/74  Pulse: 79 86  Temp:    Resp: 20 23     Intake/Output Summary (Last 24 hours) at 08/16/15 1028 Last data filed at 08/16/15 0538  Gross per 24 hour  Intake    200 ml  Output   1120 ml  Net   -920 ml    GENERAL:alert, no distress and comfortable. Accompanied by brother-in-law and pastor. He is on 2 L of oxygen. SKIN: No rash. Patient has a Mediport in place right chest wall; Pleurx catheter that is capped.  EYES: normal, Conjunctiva are pink and non-injected, sclera clear OROPHARYNX: No thrush ulceration. LYMPH:  no palpable lymphadenopathy in the cervical, axillary or inguinal LUNGS: Decreased breath sounds on the right side compared to the left. HEART: regular rate & rhythm and no murmurs and no lower extremity edema ABDOMEN:abdomen soft, non-tender and normal bowel sounds Musculoskeletal:no cyanosis of digits and no clubbing  NEURO: alert & oriented x 3 with fluent speech, no focal motor/sensory deficits   Labs:  Lab Results  Component Value Date   WBC 11.8* 08/16/2015   HGB 13.1 08/16/2015   HCT 39.5* 08/16/2015   MCV 92.2 08/16/2015   PLT 290 08/16/2015    Lab Results  Component Value Date   NA 134* 08/16/2015   K 4.4 08/16/2015   CL 99* 08/16/2015   CO2 28 08/16/2015    Studies:  Dg Chest 1 View  08/15/2015  CLINICAL DATA:  Shortness of Breath EXAM: CHEST 1 VIEW COMPARISON:  Aug 14, 2015 FINDINGS: Port-A-Cath tip is in the superior vena cava. No pneumothorax. There is airspace consolidation throughout much the right mid and lower  lung zones, stable. The mass in the medial right upper lobe is stable with volume loss this area. On the left, there is mild left base atelectasis. Left lung otherwise is clear. The heart size is upper normal with pulmonary vascularity within normal limits. No bone lesions evident. IMPRESSION: Persistent medial left upper lobe mass with volume loss. Areas of effusion and patchy consolidation on the right elsewhere stable. There is mild left base atelectasis. No new opacity on either side. No change cardiac silhouette. No pneumothorax. Electronically Signed   By: Lowella Grip III M.D.   On: 08/15/2015 08:00    Assessment & Plan:   # 55 year old male patient presenting with worsening shortness of breath- noted to have collapse of the right lung/large right pleural effusion; bronchoscopy- adenocarcinoma.  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 We'll plan early next week.  # Respiratory failure- secondary to right lung collapse/large right-sided pleural effusion- improvement noted. Patient is on 2 L currently saturating 94%.   # The above plan of care was discussed with the patient in detail.   [name], MD 08/16/2015  10:28 AM

## 2015-08-17 ENCOUNTER — Other Ambulatory Visit: Payer: Self-pay | Admitting: Hematology and Oncology

## 2015-08-17 DIAGNOSIS — R918 Other nonspecific abnormal finding of lung field: Secondary | ICD-10-CM

## 2015-08-17 LAB — CBC
HCT: 41.8 % (ref 40.0–52.0)
Hemoglobin: 13.8 g/dL (ref 13.0–18.0)
MCH: 30 pg (ref 26.0–34.0)
MCHC: 33 g/dL (ref 32.0–36.0)
MCV: 90.7 fL (ref 80.0–100.0)
PLATELETS: 336 10*3/uL (ref 150–440)
RBC: 4.61 MIL/uL (ref 4.40–5.90)
RDW: 13.1 % (ref 11.5–14.5)
WBC: 13.8 10*3/uL — AB (ref 3.8–10.6)

## 2015-08-17 LAB — BASIC METABOLIC PANEL
ANION GAP: 7 (ref 5–15)
BUN: 19 mg/dL (ref 6–20)
CALCIUM: 8.7 mg/dL — AB (ref 8.9–10.3)
CO2: 30 mmol/L (ref 22–32)
Chloride: 97 mmol/L — ABNORMAL LOW (ref 101–111)
Creatinine, Ser: 1.04 mg/dL (ref 0.61–1.24)
Glucose, Bld: 127 mg/dL — ABNORMAL HIGH (ref 65–99)
Potassium: 4.8 mmol/L (ref 3.5–5.1)
Sodium: 134 mmol/L — ABNORMAL LOW (ref 135–145)

## 2015-08-17 LAB — GLUCOSE, CAPILLARY
Glucose-Capillary: 114 mg/dL — ABNORMAL HIGH (ref 65–99)
Glucose-Capillary: 179 mg/dL — ABNORMAL HIGH (ref 65–99)

## 2015-08-17 MED ORDER — ALBUTEROL SULFATE (2.5 MG/3ML) 0.083% IN NEBU: 2.5000 mg | INHALATION_SOLUTION | RESPIRATORY_TRACT | Status: DC | PRN

## 2015-08-17 MED ORDER — FOLIC ACID 1 MG PO TABS: 1.0000 mg | ORAL_TABLET | Freq: Every day | ORAL | Status: DC

## 2015-08-17 MED ORDER — HYDROCODONE-ACETAMINOPHEN 5-325 MG PO TABS
1.0000 | ORAL_TABLET | Freq: Four times a day (QID) | ORAL | Status: DC
  Administered 2015-08-17: 09:00:00 1 via ORAL
  Filled 2015-08-17: qty 1

## 2015-08-17 MED ORDER — ALBUTEROL SULFATE HFA 108 (90 BASE) MCG/ACT IN AERS: 1.0000 | INHALATION_SPRAY | RESPIRATORY_TRACT | Status: DC

## 2015-08-17 MED ORDER — COLCHICINE 0.6 MG PO TABS
0.6000 mg | ORAL_TABLET | Freq: Every day | ORAL | Status: DC | PRN
  Filled 2015-08-17: qty 1

## 2015-08-17 MED ORDER — FLUOXETINE HCL 20 MG PO CAPS
20.0000 mg | ORAL_CAPSULE | Freq: Every day | ORAL | Status: DC
  Administered 2015-08-17: 20 mg via ORAL
  Filled 2015-08-17: qty 1

## 2015-08-17 NOTE — Progress Notes (Signed)
SATURATION QUALIFICATIONS: (This note is used to comply with regulatory documentation for home oxygen)  Patient Saturations on Room Air at Rest = 88%  Patient Saturations on Room Air while Ambulating = 86%  Patient Saturations on 2 Liters of oxygen while Ambulating = 95%  Please briefly explain why patient needs home oxygen: 

## 2015-08-17 NOTE — Discharge Summary (Signed)
[facility] [place] NAME: Stuart Spence    MR#:  [id number]  DATE OF BIRTH:  [birth year]  DATE OF ADMISSION:  08/10/2015 ADMITTING PHYSICIAN: Lytle Butte, MD  DATE OF DISCHARGE: 08/17/2015  PRIMARY CARE PHYSICIAN: Golden Pop, MD    ADMISSION DIAGNOSIS:  Respiratory distress [R06.00] SOB (shortness of breath) [R06.02] Pleural effusion, right [J94.8] Cavitating mass in right upper lung lobe [J98.4] Ascites [R18.8]  DISCHARGE DIAGNOSIS:   Right lung adenocarcinoma  Malignant Pleural effusion s/p insertion of Pleurx catheter right pleural space  S/p Port-A-Cath insertion right internal jugular vein  HTN SECONDARY DIAGNOSIS:   Past Medical History  Diagnosis Date  . Depression   . Gout   . ED (erectile dysfunction)   . Hypertension     HOSPITAL COURSE:   Stuart Spence is a 55 y.o. male with a known history of Essential hypertension who is presenting with shortness of breath. He described having progressive shortness of breath originally dyspnea on exertion t for about 1 month. He went to an outside facility about a week ago given symptoms of nonproductive cough and shortness of breath pleuritic chest pain at that time had a chest x-ray and CAT scan performed which revealed a right lung mass.  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 Genevive Bi and oncologyconsulted- appreciate help -Thoracentesis right-sided 2 removed ~5 L  -preliminary pleural fluid cytopathology results revealed  adenocarcinoma - Presently is on high flow nasal cannula and critically ill---Weaned to nasal cannula now sats 94-97% on 2 L. Patient will need home oxygen -was On Zosyn IV due to concern for postobstructive pneumonia.(day 7), completed -Ps/p Pleurx catheter placement along with Port-A-Cath placement. -Patient getting radiological workup for carcinoma  staging as out pt -Appreciate oncology recommendations. 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  * Hypertension Continue Norvasc and benazepril.  * DVT prophylaxis with Lovenox  Overall improved D/c home with oxygen and HH CONSULTS OBTAINED:  Treatment Team:  Lytle Butte, MD Flora Lipps, MD Laverle Hobby, MD Nestor Lewandowsky, MD  DRUG ALLERGIES:  No Known Allergies  DISCHARGE MEDICATIONS:   Current Discharge Medication List    START taking these medications   Details  folic acid (FOLVITE) 1 MG tablet Take 1 tablet (1 mg total) by mouth daily. Qty: 30 tablet, Refills: 3   Associated Diagnoses: Lung mass      CONTINUE these medications which have NOT CHANGED   Details  albuterol (PROVENTIL HFA;VENTOLIN HFA) 108 (90 Base) MCG/ACT inhaler Inhale 1-2 puffs into the lungs See admin instructions. Inhale 1 to 2 puff by mouth every 4 to 6 hours as needed for difficulty breathing.    amLODipine (NORVASC) 10 MG tablet Take 1 tablet (10 mg total) by mouth daily. Qty: 30 tablet, Refills: 6    benazepril (LOTENSIN) 40 MG tablet Take 1 tablet (40 mg total) by mouth daily. Qty: 30 tablet, Refills: 6    colchicine 0.6 MG tablet Take 0.6 mg by mouth daily as needed (two pills by mouth at onset, the one pill one hour later, maximum three pills per gout flare).    FLUoxetine (PROZAC) 20 MG capsule Take 1 capsule (20 mg total) by mouth daily. Qty: 30 capsule, Refills: 6    HYDROcodone-acetaminophen (NORCO/VICODIN) 5-325 MG tablet Take  1 tablet by mouth every 6 (six) hours.    ondansetron (ZOFRAN) 4 MG tablet Take 4 mg by mouth every 6 (six) hours as needed for nausea or vomiting.    sildenafil (VIAGRA) 100 MG tablet Take 1 tablet (100 mg total) by mouth daily as needed for erectile dysfunction. Qty: 10 tablet, Refills: 12        If you  experience worsening of your admission symptoms, develop shortness of breath, life threatening emergency, suicidal or homicidal thoughts you must seek medical attention immediately by calling 911 or calling your MD immediately  if symptoms less severe.  You Must read complete instructions/literature along with all the possible adverse reactions/side effects for all the Medicines you take and that have been prescribed to you. Take any new Medicines after you have completely understood and accept all the possible adverse reactions/side effects.   Please note  You were cared for by a hospitalist during your hospital stay. If you have any questions about your discharge medications or the care you received while you were in the hospital after you are discharged, you can call the unit and asked to speak with the hospitalist on call if the hospitalist that took care of you is not available. Once you are discharged, your primary care physician will handle any further medical issues. Please note that NO REFILLS for any discharge medications will be authorized once you are discharged, as it is imperative that you return to your primary care physician (or establish a relationship with a primary care physician if you do not have one) for your aftercare needs so that they can reassess your need for medications and monitor your lab values. Today   SUBJECTIVE   Doing well  VITAL SIGNS:  Blood pressure 112/67, pulse 88, temperature 97.6 F (36.4 C), temperature source Oral, resp. rate 18, height '5\' 7"'$  (1.702 m), weight 109 kg (240 lb 4.8 oz), SpO2 100 %.  I/O:   Intake/Output Summary (Last 24 hours) at 08/17/15 0826 Last data filed at 08/16/15 1500  Gross per 24 hour  Intake      0 ml  Output    200 ml  Net   -200 ml    PHYSICAL EXAMINATION:  GENERAL:  55 y.o.-year-old patient lying in the bed with no acute distress.  EYES: Pupils equal, round, reactive to light and accommodation. No scleral icterus.  Extraocular muscles intact.  HEENT: Head atraumatic, normocephalic. Oropharynx and nasopharynx clear.  NECK:  Supple, no jugular venous distention. No thyroid enlargement, no tenderness.  LUNGS:decreased breath sounds bilaterally, no wheezing, rales,rhonchi or crepitation. No use of accessory muscles of respiration. Right chest pleurx cath+ CARDIOVASCULAR: S1, S2 normal. No murmurs, rubs, or gallops.  ABDOMEN: Soft, non-tender, non-distended. Bowel sounds present. No organomegaly or mass.  EXTREMITIES: No pedal edema, cyanosis, or clubbing.  NEUROLOGIC: Cranial nerves II through XII are intact. Muscle strength 5/5 in all extremities. Sensation intact. Gait not checked.  PSYCHIATRIC: The patient is alert and oriented x 3.  SKIN: No obvious rash, lesion, or ulcer.   DATA REVIEW:   CBC   Recent Labs Lab 08/17/15 0521  WBC 13.8*  HGB 13.8  HCT 41.8  PLT 336    Chemistries   Recent Labs Lab 08/15/15 0328  08/17/15 0521  NA 135  < > 134*  K 4.5  < > 4.8  CL 96*  < > 97*  CO2 32  < > 30  GLUCOSE 111*  < > 127*  BUN 33*  < >  19  CREATININE 1.15  < > 1.04  CALCIUM 8.6*  < > 8.7*  AST 18  --   --   ALT 24  --   --   ALKPHOS 101  --   --   BILITOT 0.6  --   --   < > = values in this interval not displayed.  Microbiology Results   Recent Results (from the past 240 hour(s))  Blood culture (routine x 2)     Status: None   Collection Time: 08/10/15  3:54 PM  Result Value Ref Range Status   Specimen Description BLOOD RIGHT HAND  Final   Special Requests   Final    BOTTLES DRAWN AEROBIC AND ANAEROBIC  AER 4CC ANA 10CC   Culture NO GROWTH 5 DAYS  Final   Report Status 08/15/2015 FINAL  Final  Blood culture (routine x 2)     Status: None   Collection Time: 08/10/15  5:30 PM  Result Value Ref Range Status   Specimen Description BLOOD RIGHT ARM  Final   Special Requests BOTTLES DRAWN AEROBIC AND ANAEROBIC  1CC  Final   Culture NO GROWTH 5 DAYS  Final   Report Status 08/15/2015  FINAL  Final  MRSA PCR Screening     Status: None   Collection Time: 08/10/15 11:06 PM  Result Value Ref Range Status   MRSA by PCR NEGATIVE NEGATIVE Final    Comment:        The GeneXpert MRSA Assay (FDA approved for NASAL specimens only), is one component of a comprehensive MRSA colonization surveillance program. It is not intended to diagnose MRSA infection nor to guide or monitor treatment for MRSA infections.     RADIOLOGY:  No results found.   Management plans discussed with the patient, family and they are in agreement.  CODE STATUS:     Code Status Orders        Start     Ordered   08/10/15 1702  Full code   Continuous     08/10/15 1701    Code Status History    Date Active Date Inactive Code Status Order ID Comments User Context   This patient has a current code status but no historical code status.    Advance Directive Documentation        Most Recent Value   Type of Advance Directive  Healthcare Power of Attorney   Pre-existing out of facility DNR order (yellow form or pink MOST form)     "MOST" Form in Place?        TOTAL TIME TAKING CARE OF THIS PATIENT:40 minutes.    Reaghan Kawa M.D on 08/17/2015 at 8:26 AM  Between 7am to 6pm - Pager - [phone] After 6pm go to [email or web address] - password [email or web address] [facility]  Office  [phone]  CC: Primary care physician; Golden Pop, MD

## 2015-08-17 NOTE — Discharge Instructions (Signed)
HHRN and PT Use oxygen as instructed pleurx catheter care per instructions

## 2015-08-17 NOTE — Care Management Note (Addendum)
Case Management Note  Patient Details  Name: Stuart Spence MRN: [id number] Date of Birth: February 26, 1961  Subjective/Objective:      Insurance Information: Through [employer]. [place], [place], [place], [place], [address] phone [phone] and [phone]. Group# [id number], ID [id number]. A referral was faxed and called to [facility] at [facility] requesting new oxygen and portable tank delivered to [name] hospital room today. Also,faxed a request for  home health PT, RN, Respiratory Care to [place].               Action/Plan:   Expected Discharge Date:                  Expected Discharge Plan:     In-House Referral:     Discharge planning Services     Post Acute Care Choice:    Choice offered to:     DME Arranged:    DME Agency:     HH Arranged:    [place] Agency:     Status of Service:     Medicare Important Message Given:    Date Medicare IM Given:    Medicare IM give by:    Date Additional Medicare IM Given:    Additional Medicare Important Message give by:     If discussed at [place] of Stay Meetings, dates discussed:    Additional Comments:  Aadya Kindler A, RN 08/17/2015, 10:23 AM

## 2015-08-17 NOTE — Progress Notes (Signed)
Stuart Spence   DOB:07-23-1960   IO#:[id number]    Subjective: Patient's breathing has significantly improved in the last day or so. Is currently on 2 L C cannula-saturating 96-97%. He gets short of breath only on exertion.  Coughing is improving. No fevers.  Review of system: No diarrhea or constipation. Appetite is poor. No headaches.  Objective:  Filed Vitals:   08/17/15 0439 08/17/15 0851  BP: 112/67 131/69  Pulse: 88 86  Temp: 97.6 F (36.4 C) 97.9 F (36.6 C)  Resp: 18      Intake/Output Summary (Last 24 hours) at 08/17/15 1053 Last data filed at 08/17/15 0800  Gross per 24 hour  Intake    120 ml  Output    200 ml  Net    -80 ml    GENERAL:alert, no distress and comfortable.He is alone. He is on 2 L of oxygen. SKIN: No rash. Patient has a Mediport in place right chest wall; Pleurx catheter that is capped.  EYES: normal, Conjunctiva are pink and non-injected, sclera clear OROPHARYNX: No thrush ulceration. LYMPH:  no palpable lymphadenopathy in the cervical, axillary or inguinal LUNGS: Decreased breath sounds on the right side compared to the left. HEART: regular rate & rhythm and no murmurs and no lower extremity edema ABDOMEN:abdomen soft, non-tender and normal bowel sounds Musculoskeletal:no cyanosis of digits and no clubbing  NEURO: alert & oriented x 3 with fluent speech, no focal motor/sensory deficits   Labs:  Lab Results  Component Value Date   WBC 13.8* 08/17/2015   HGB 13.8 08/17/2015   HCT 41.8 08/17/2015   MCV 90.7 08/17/2015   PLT 336 08/17/2015    Lab Results  Component Value Date   NA 134* 08/17/2015   K 4.8 08/17/2015   CL 97* 08/17/2015   CO2 30 08/17/2015    Studies:  No results found.  Assessment & Plan:   # 55 year old male patient presenting with worsening shortness of breath- noted to have collapse of the right lung/large right pleural effusion; bronchoscopy- adenocarcinoma.  # Stage IV adenocarcinoma the lung- patient will need  chemotherapy Early next week. Patient is on folic acid. He started on B12 injections.  # Respiratory failure- secondary to right lung collapse/large right-sided pleural effusion- status post thoracentesis/reexpansion of the lung. Significant improvement noted. Patient on oxygen 2 L.  # Patient was asked to call the cancer Center in the morning tomorrow/for appointment for starting chemotherapy.   # The above plan of care was discussed with the patient in detail. Also discussed with Dr. Posey Pronto.  [name], MD 08/17/2015  10:53 AM

## 2015-08-18 ENCOUNTER — Emergency Department
Admission: EM | Admit: 2015-08-18 | Discharge: 2015-08-18 | Disposition: A | Discharge status: Home / Self Care | Payer: No Typology Code available for payment source | Attending: Emergency Medicine | Admitting: Emergency Medicine

## 2015-08-18 ENCOUNTER — Emergency Department: Payer: No Typology Code available for payment source

## 2015-08-18 ENCOUNTER — Encounter: Payer: Self-pay | Admitting: Emergency Medicine

## 2015-08-18 ENCOUNTER — Telehealth: Payer: Self-pay | Admitting: *Deleted

## 2015-08-18 DIAGNOSIS — J90 Pleural effusion, not elsewhere classified: Secondary | ICD-10-CM

## 2015-08-18 DIAGNOSIS — F329 Major depressive disorder, single episode, unspecified: Secondary | ICD-10-CM | POA: Insufficient documentation

## 2015-08-18 DIAGNOSIS — T859XXA Unspecified complication of internal prosthetic device, implant and graft, initial encounter: Secondary | ICD-10-CM

## 2015-08-18 DIAGNOSIS — R509 Fever, unspecified: Secondary | ICD-10-CM | POA: Diagnosis present

## 2015-08-18 DIAGNOSIS — Z79899 Other long term (current) drug therapy: Secondary | ICD-10-CM | POA: Diagnosis not present

## 2015-08-18 DIAGNOSIS — Y69 Unspecified misadventure during surgical and medical care: Secondary | ICD-10-CM | POA: Diagnosis not present

## 2015-08-18 DIAGNOSIS — I10 Essential (primary) hypertension: Secondary | ICD-10-CM | POA: Diagnosis not present

## 2015-08-18 DIAGNOSIS — C3490 Malignant neoplasm of unspecified part of unspecified bronchus or lung: Secondary | ICD-10-CM

## 2015-08-18 DIAGNOSIS — T85698A Other mechanical complication of other specified internal prosthetic devices, implants and grafts, initial encounter: Secondary | ICD-10-CM | POA: Insufficient documentation

## 2015-08-18 LAB — BASIC METABOLIC PANEL
Anion gap: 8 (ref 5–15)
BUN: 18 mg/dL (ref 6–20)
CALCIUM: 8.7 mg/dL — AB (ref 8.9–10.3)
CO2: 28 mmol/L (ref 22–32)
CREATININE: 1.14 mg/dL (ref 0.61–1.24)
Chloride: 98 mmol/L — ABNORMAL LOW (ref 101–111)
GFR calc Af Amer: >60 mL/min (ref >60)
GLUCOSE: 170 mg/dL — AB (ref 65–99)
Potassium: 4.1 mmol/L (ref 3.5–5.1)
SODIUM: 134 mmol/L — AB (ref 135–145)

## 2015-08-18 LAB — CBC
HCT: 38.8 % — ABNORMAL LOW (ref 40.0–52.0)
Hemoglobin: 13.1 g/dL (ref 13.0–18.0)
MCH: 30.5 pg (ref 26.0–34.0)
MCHC: 33.7 g/dL (ref 32.0–36.0)
MCV: 90.4 fL (ref 80.0–100.0)
PLATELETS: 310 10*3/uL (ref 150–440)
RBC: 4.29 MIL/uL — AB (ref 4.40–5.90)
RDW: 12.9 % (ref 11.5–14.5)
WBC: 14.4 10*3/uL — ABNORMAL HIGH (ref 3.8–10.6)

## 2015-08-18 NOTE — ED Notes (Addendum)
Pt was sent here by home health has lung cancer and nurse stated he was pale and had fever. States has had fever since last week was dx with lung cancer and was admitted for pleural effusion and chest tube placement.  Home health nurse stated insertion site looks red pt with no complaints of shob.

## 2015-08-18 NOTE — Patient Instructions (Signed)
Pembrolizumab injection What is this medicine? PEMBROLIZUMAB (pem broe liz ue mab) is a monoclonal antibody. It is used to treat melanoma and non-small cell lung cancer. This medicine may be used for other purposes; ask your health care provider or pharmacist if you have questions. What should I tell my health care provider before I take this medicine? They need to know if you have any of these conditions: -diabetes -immune system problems -inflammatory bowel disease -liver disease -lung or breathing disease -lupus -an unusual or allergic reaction to pembrolizumab, other medicines, foods, dyes, or preservatives -pregnant or trying to get pregnant -breast-feeding How should I use this medicine? This medicine is for infusion into a vein. It is given by a health care professional in a hospital or clinic setting. A special MedGuide will be given to you before each treatment. Be sure to read this information carefully each time. Talk to your pediatrician regarding the use of this medicine in children. Special care may be needed. Overdosage: If you think you have taken too much of this medicine contact a poison control center or emergency room at once. NOTE: This medicine is only for you. Do not share this medicine with others. What if I miss a dose? It is important not to miss your dose. Call your doctor or health care professional if you are unable to keep an appointment. What may interact with this medicine? Interactions have not been studied. Give your health care provider a list of all the medicines, herbs, non-prescription drugs, or dietary supplements you use. Also tell them if you smoke, drink alcohol, or use illegal drugs. Some items may interact with your medicine. This list may not describe all possible interactions. Give your health care provider a list of all the medicines, herbs, non-prescription drugs, or dietary supplements you use. Also tell them if you smoke, drink alcohol, or  use illegal drugs. Some items may interact with your medicine. What should I watch for while using this medicine? Your condition will be monitored carefully while you are receiving this medicine. You may need blood work done while you are taking this medicine. Do not become pregnant while taking this medicine or for 4 months after stopping it. Women should inform their doctor if they wish to become pregnant or think they might be pregnant. There is a potential for serious side effects to an unborn child. Talk to your health care professional or pharmacist for more information. Do not breast-feed an infant while taking this medicine or for 4 months after the last dose. What side effects may I notice from receiving this medicine? Side effects that you should report to your doctor or health care professional as soon as possible: -allergic reactions like skin rash, itching or hives, swelling of the face, lips, or tongue -bloody or black, tarry stools -breathing problems -change in the amount of urine -changes in vision -chest pain -chills -dark urine -dizziness or feeling faint or lightheaded -fast or irregular heartbeat -fever -flushing -hair loss -muscle pain -muscle weakness -persistent headache -signs and symptoms of high blood sugar such as dizziness; dry mouth; dry skin; fruity breath; nausea; stomach pain; increased hunger or thirst; increased urination -signs and symptoms of liver injury like dark urine, light-colored stools, loss of appetite, nausea, right upper belly pain, yellowing of the eyes or skin -stomach pain -weight loss Side effects that usually do not require medical attention (Report these to your doctor or health care professional if they continue or are bothersome.):constipation -cough -diarrhea -joint pain -  tiredness This list may not describe all possible side effects. Call your doctor for medical advice about side effects. You may report side effects to FDA at  1-800-FDA-1088. Where should I keep my medicine? This drug is given in a hospital or clinic and will not be stored at home. NOTE: This sheet is a summary. It may not cover all possible information. If you have questions about this medicine, talk to your doctor, pharmacist, or health care provider.    2016, [employer]. (2014-05-21 17:24:19) Pemetrexed injection What is this medicine? PEMETREXED (PEM e TREX ed) is a chemotherapy drug. This medicine affects cells that are rapidly growing, such as cancer cells and cells in your mouth and stomach. It is usually used to treat lung cancers like non-small cell lung cancer and mesothelioma. It may also be used to treat other cancers. This medicine may be used for other purposes; ask your health care provider or pharmacist if you have questions. What should I tell my health care provider before I take this medicine? They need to know if you have any of these conditions: -if you frequently drink alcohol containing beverages -infection (especially a virus infection such as chickenpox, cold sores, or herpes) -kidney disease -liver disease -low blood counts, like low platelets, red bloods, or white blood cells -an unusual or allergic reaction to pemetrexed, mannitol, other medicines, foods, dyes, or preservatives -pregnant or trying to get pregnant -breast-feeding How should I use this medicine? This drug is given as an infusion into a vein. It is administered in a hospital or clinic by a specially trained health care professional. Talk to your pediatrician regarding the use of this medicine in children. Special care may be needed. Overdosage: If you think you have taken too much of this medicine contact a poison control center or emergency room at once. NOTE: This medicine is only for you. Do not share this medicine with others. What if I miss a dose? It is important not to miss your dose. Call your doctor or health care professional if you  are unable to keep an appointment. What may interact with this medicine? -aspirin and aspirin-like medicines -medicines to increase blood counts like filgrastim, pegfilgrastim, sargramostim -methotrexate -NSAIDS, medicines for pain and inflammation, like ibuprofen or naproxen -probenecid -pyrimethamine -vaccines Talk to your doctor or health care professional before taking any of these medicines: -acetaminophen -aspirin -ibuprofen -ketoprofen -naproxen This list may not describe all possible interactions. Give your health care provider a list of all the medicines, herbs, non-prescription drugs, or dietary supplements you use. Also tell them if you smoke, drink alcohol, or use illegal drugs. Some items may interact with your medicine. What should I watch for while using this medicine? Visit your doctor for checks on your progress. This drug may make you feel generally unwell. This is not uncommon, as chemotherapy can affect healthy cells as well as cancer cells. Report any side effects. Continue your course of treatment even though you feel ill unless your doctor tells you to stop. In some cases, you may be given additional medicines to help with side effects. Follow all directions for their use. Call your doctor or health care professional for advice if you get a fever, chills or sore throat, or other symptoms of a cold or flu. Do not treat yourself. This drug decreases your body's ability to fight infections. Try to avoid being around people who are sick. This medicine may increase your risk to bruise or bleed. Call your doctor or  health care professional if you notice any unusual bleeding. Be careful brushing and flossing your teeth or using a toothpick because you may get an infection or bleed more easily. If you have any dental work done, tell your dentist you are receiving this medicine. Avoid taking products that contain aspirin, acetaminophen, ibuprofen, naproxen, or ketoprofen unless  instructed by your doctor. These medicines may hide a fever. Call your doctor or health care professional if you get diarrhea or mouth sores. Do not treat yourself. To protect your kidneys, drink water or other fluids as directed while you are taking this medicine. Men and women must use effective birth control while taking this medicine. You may also need to continue using effective birth control for a time after stopping this medicine. Do not become pregnant while taking this medicine. Tell your doctor right away if you think that you or your partner might be pregnant. There is a potential for serious side effects to an unborn child. Talk to your health care professional or pharmacist for more information. Do not breast-feed an infant while taking this medicine. This medicine may lower sperm counts. What side effects may I notice from receiving this medicine? Side effects that you should report to your doctor or health care professional as soon as possible: -allergic reactions like skin rash, itching or hives, swelling of the face, lips, or tongue -low blood counts - this medicine may decrease the number of white blood cells, red blood cells and platelets. You may be at increased risk for infections and bleeding. -signs of infection - fever or chills, cough, sore throat, pain or difficulty passing urine -signs of decreased platelets or bleeding - bruising, pinpoint red spots on the skin, black, tarry stools, blood in the urine -signs of decreased red blood cells - unusually weak or tired, fainting spells, lightheadedness -breathing problems, like a dry cough -changes in emotions or moods -chest pain -confusion -diarrhea -high blood pressure -mouth or throat sores or ulcers -pain, swelling, warmth in the leg -pain on swallowing -swelling of the ankles, feet, hands -trouble passing urine or change in the amount of urine -vomiting -yellowing of the eyes or skin Side effects that usually do not  require medical attention (report to your doctor or health care professional if they continue or are bothersome): -hair loss -loss of appetite -nausea -stomach upset This list may not describe all possible side effects. Call your doctor for medical advice about side effects. You may report side effects to FDA at 1-800-FDA-1088. Where should I keep my medicine? This drug is given in a hospital or clinic and will not be stored at home. NOTE: This sheet is a summary. It may not cover all possible information. If you have questions about this medicine, talk to your doctor, pharmacist, or health care provider.    2016, [employer] Standard. (2007-10-24 13:24:03) Carboplatin injection What is this medicine? CARBOPLATIN (KAR boe pla tin) is a chemotherapy drug. It targets fast dividing cells, like cancer cells, and causes these cells to die. This medicine is used to treat ovarian cancer and many other cancers. This medicine may be used for other purposes; ask your health care provider or pharmacist if you have questions. What should I tell my health care provider before I take this medicine? They need to know if you have any of these conditions: -blood disorders -hearing problems -kidney disease -recent or ongoing radiation therapy -an unusual or allergic reaction to carboplatin, cisplatin, other chemotherapy, other medicines, foods, dyes, or preservatives -  pregnant or trying to get pregnant -breast-feeding How should I use this medicine? This drug is usually given as an infusion into a vein. It is administered in a hospital or clinic by a specially trained health care professional. Talk to your pediatrician regarding the use of this medicine in children. Special care may be needed. Overdosage: If you think you have taken too much of this medicine contact a poison control center or emergency room at once. NOTE: This medicine is only for you. Do not share this medicine with others. What if I  miss a dose? It is important not to miss a dose. Call your doctor or health care professional if you are unable to keep an appointment. What may interact with this medicine? -medicines for seizures -medicines to increase blood counts like filgrastim, pegfilgrastim, sargramostim -some antibiotics like amikacin, gentamicin, neomycin, streptomycin, tobramycin -vaccines Talk to your doctor or health care professional before taking any of these medicines: -acetaminophen -aspirin -ibuprofen -ketoprofen -naproxen This list may not describe all possible interactions. Give your health care provider a list of all the medicines, herbs, non-prescription drugs, or dietary supplements you use. Also tell them if you smoke, drink alcohol, or use illegal drugs. Some items may interact with your medicine. What should I watch for while using this medicine? Your condition will be monitored carefully while you are receiving this medicine. You will need important blood work done while you are taking this medicine. This drug may make you feel generally unwell. This is not uncommon, as chemotherapy can affect healthy cells as well as cancer cells. Report any side effects. Continue your course of treatment even though you feel ill unless your doctor tells you to stop. In some cases, you may be given additional medicines to help with side effects. Follow all directions for their use. Call your doctor or health care professional for advice if you get a fever, chills or sore throat, or other symptoms of a cold or flu. Do not treat yourself. This drug decreases your body's ability to fight infections. Try to avoid being around people who are sick. This medicine may increase your risk to bruise or bleed. Call your doctor or health care professional if you notice any unusual bleeding. Be careful brushing and flossing your teeth or using a toothpick because you may get an infection or bleed more easily. If you have any dental  work done, tell your dentist you are receiving this medicine. Avoid taking products that contain aspirin, acetaminophen, ibuprofen, naproxen, or ketoprofen unless instructed by your doctor. These medicines may hide a fever. Do not become pregnant while taking this medicine. Women should inform their doctor if they wish to become pregnant or think they might be pregnant. There is a potential for serious side effects to an unborn child. Talk to your health care professional or pharmacist for more information. Do not breast-feed an infant while taking this medicine. What side effects may I notice from receiving this medicine? Side effects that you should report to your doctor or health care professional as soon as possible: -allergic reactions like skin rash, itching or hives, swelling of the face, lips, or tongue -signs of infection - fever or chills, cough, sore throat, pain or difficulty passing urine -signs of decreased platelets or bleeding - bruising, pinpoint red spots on the skin, black, tarry stools, nosebleeds -signs of decreased red blood cells - unusually weak or tired, fainting spells, lightheadedness -breathing problems -changes in hearing -changes in vision -chest pain -high blood  pressure -low blood counts - This drug may decrease the number of white blood cells, red blood cells and platelets. You may be at increased risk for infections and bleeding. -nausea and vomiting -pain, swelling, redness or irritation at the injection site -pain, tingling, numbness in the hands or feet -problems with balance, talking, walking -trouble passing urine or change in the amount of urine Side effects that usually do not require medical attention (report to your doctor or health care professional if they continue or are bothersome): -hair loss -loss of appetite -metallic taste in the mouth or changes in taste This list may not describe all possible side effects. Call your doctor for medical  advice about side effects. You may report side effects to FDA at 1-800-FDA-1088. Where should I keep my medicine? This drug is given in a hospital or clinic and will not be stored at home. NOTE: This sheet is a summary. It may not cover all possible information. If you have questions about this medicine, talk to your doctor, pharmacist, or health care provider.    2016, [employer]. (2007-06-27 14:38:05)

## 2015-08-18 NOTE — Discharge Instructions (Signed)
Please follow the instructions as directed regarding your pleural catheter. Please make an appointment to follow up with your oncologist.  Please make sure Home Health has Pleur-x drainage kit for drainage on Wednesday.  Return to the emergency department if you develop a fever greater than 100.5, chest pain, shortness of breath, cough or coughing blood, lightheadedness or fainting, or any other symptoms concerning to you.

## 2015-08-18 NOTE — ED Notes (Signed)
PlurX suction removed from chest tube. Very little drainage noted. Chest tube flushed with 10cc of normal saline by charge nurse, lea. Patient tolerated well.

## 2015-08-18 NOTE — ED Notes (Signed)
No redness noted around right chest tube. Lungs are clear in all fields. Temp 98.7.

## 2015-08-18 NOTE — Telephone Encounter (Signed)
Spoke to wife and gave her all appts for pt. Dr. Mike Gip would like to see pt get started on treatment this week. In order to work it out. Pet wed, chemo class this thrusday and see md with labs on same day. Chemo on Friday and then ct of head 5/24 all dates and times and location and directions given and she can call me back if any questions and she is agreeable to the plan

## 2015-08-18 NOTE — ED Provider Notes (Signed)
[facility] Emergency Department Provider Note  ____________________________________________  Time seen: Approximately 8:24 PM  I have reviewed the triage vital signs and the nursing notes.   HISTORY  Chief Complaint Fever    HPI Stuart Spence is a 55 y.o. male discharged yesterday after diagnosis of right upper lobe adenocarcinoma with an indwelling pleural catheter for drainage of right pleural effusion presenting for home health nurse concern of erythema around the catheter site, as well as increased temperature. The patient reports that the home health nurse came today, and neither the patient nor the nurse had the equipment necessary to perform pleural catheter drainage. In addition, the patient had a temperature of 99.5, and the nurse was concerned there is erythema around the tube site. The patient does not note any change in his shortness of breath and has been maintaining normal saturations on 2 L nasal cannula at home. He has not had any cough, cold, nausea vomiting or diarrhea.   Past Medical History  Diagnosis Date  . Depression   . Gout   . ED (erectile dysfunction)   . Hypertension     Patient Active Problem List   Diagnosis Date Noted  . Lung mass   . Pleural effusion, right   . Respiratory failure with hypoxia ([place]) 08/10/2015  . Essential hypertension 06/23/2015  . Depression 06/23/2015    Past Surgical History  Procedure Laterality Date  . Tonsillectomy    . Portacath placement Left 08/13/2015    Procedure: INSERTION PORT-A-CATH;  Surgeon: Nestor Lewandowsky, MD;  Location: [facility] ORS;  Service: General;  Laterality: Left;  . Chest tube insertion Left 08/13/2015    Procedure: CHEST TUBE INSERTION;  Surgeon: Nestor Lewandowsky, MD;  Location: [facility] ORS;  Service: General;  Laterality: Left;  . Portacath placement      Current Outpatient Rx  Name  Route  Sig  Dispense  Refill  . albuterol (PROVENTIL HFA;VENTOLIN HFA) 108 (90 Base) MCG/ACT inhaler    Inhalation   Inhale 1-2 puffs into the lungs See admin instructions. Inhale 1 to 2 puff by mouth every 4 to 6 hours as needed for difficulty breathing.         Marland Kitchen amLODipine (NORVASC) 10 MG tablet   Oral   Take 1 tablet (10 mg total) by mouth daily.   30 tablet   6   . benazepril (LOTENSIN) 40 MG tablet   Oral   Take 1 tablet (40 mg total) by mouth daily.   30 tablet   6   . colchicine 0.6 MG tablet   Oral   Take 0.6 mg by mouth daily as needed (two pills by mouth at onset, the one pill one hour later, maximum three pills per gout flare).         Marland Kitchen FLUoxetine (PROZAC) 20 MG capsule   Oral   Take 1 capsule (20 mg total) by mouth daily.   30 capsule   6   . folic acid (FOLVITE) 1 MG tablet   Oral   Take 1 tablet (1 mg total) by mouth daily.   30 tablet   3   . HYDROcodone-acetaminophen (NORCO/VICODIN) 5-325 MG tablet   Oral   Take 1 tablet by mouth every 6 (six) hours.         . ondansetron (ZOFRAN) 4 MG tablet   Oral   Take 4 mg by mouth every 6 (six) hours as needed for nausea or vomiting.         Marland Kitchen  sildenafil (VIAGRA) 100 MG tablet   Oral   Take 1 tablet (100 mg total) by mouth daily as needed for erectile dysfunction.   10 tablet   12     Allergies Review of patient's allergies indicates no known allergies.  Family History  Problem Relation Age of Onset  . Cancer Mother 57    lung  . Heart attack Father 59  . Hypertension Father   . Congestive Heart Failure Father 50    died from  . Diabetes Sister     Social History Social History  Substance Use Topics  . Smoking status: Never Smoker   . Smokeless tobacco: Never Used  . Alcohol Use: No     Comment: gave it up in August    Review of Systems Constitutional: No fever/chills.  Eyes: No visual changes. ENT: No sore throat. No congestion or rhinorrhea. Cardiovascular: Denies chest pain. Denies palpitations. Respiratory: Unchanged shortness of breath.  No cough. Gastrointestinal: No  abdominal pain.  No nausea, no vomiting.  No diarrhea.  No constipation. Genitourinary: Negative for dysuria. Musculoskeletal: Negative for back pain. Skin:  Concern for erythema around the right pleural catheter. Neurological: Negative for headaches. No focal numbness, tingling or weakness.   10-point ROS otherwise negative.  ____________________________________________   PHYSICAL EXAM:  VITAL SIGNS: ED Triage Vitals  Enc Vitals Group     BP 08/18/15 1936 145/74 mmHg     Pulse Rate 08/18/15 1936 102     Resp 08/18/15 1936 18     Temp 08/18/15 1936 98.5 F (36.9 C)     Temp Source 08/18/15 1936 Oral     SpO2 08/18/15 1936 100 %     Weight 08/18/15 1936 238 lb (107.956 kg)     Height 08/18/15 1936 '5\' 6"'$  (1.676 m)     Head Cir --      Peak Flow --      Pain Score 08/18/15 1937 0     Pain Loc --      Pain Edu? --      Excl. in [place]? --     Constitutional: Alert and oriented. Well appearing and in no acute distress. Answers questions appropriately.I rechecked the temperature myself and it is 98.1 at this time. Eyes: Conjunctivae are normal.  EOMI. No scleral icterus. Head: Atraumatic. Nose: No congestion/rhinnorhea. Mouth/Throat: Mucous membranes are moist.  Neck: No stridor.  Supple.  No meningismus. Cardiovascular: Normal rate, regular rhythm. No murmurs, rubs or gallops.  Respiratory: Normal respiratory effort.  No accessory muscle use or retractions. Minimal Rales in the right lung base without any eases.. Right upper lobe and left lung without wheezes, rales or ronchi. Patient saturating 97% or greater on 2 L nasal cannula. The right chest has a 3 cm sutured laceration from chest tube placement that is clean dry and intact without erythema, fluctuance or drainage. He has a pleural catheter in place that is held to the skin with 2 sutures without any erythema, fluctuance, tenderness to palpation or discharge when I gently manipulate it. There is some minimal serosanguineous  fluid in the catheter which is closed off. Gastrointestinal: Soft, nontender and nondistended.  No guarding or rebound.  No peritoneal signs. Musculoskeletal: No LE edema. No ttp in the calves or palpable cords.  Negative Homan's sign. Neurologic:  A&Ox3.  Speech is clear.  Face and smile are symmetric.  EOMI.  Moves all extremities well. Skin:  Skin is warm, dry and intact. No rash noted. Psychiatric: Mood and  affect are normal. Speech and behavior are normal.  Normal judgement.  ____________________________________________   LABS (all labs ordered are listed, but only abnormal results are displayed)  Labs Reviewed  CBC - Abnormal; Notable for the following:    WBC 14.4 (*)    RBC 4.29 (*)    HCT 38.8 (*)    All other components within normal limits  BASIC METABOLIC PANEL - Abnormal; Notable for the following:    Sodium 134 (*)    Chloride 98 (*)    Glucose, Bld 170 (*)    Calcium 8.7 (*)    All other components within normal limits   ____________________________________________  EKG  Not indicated ____________________________________________  RADIOLOGY  Dg Chest 2 View  08/18/2015  CLINICAL DATA:  Low-grade fever, redness at chest tube insertion site, chest tube placed 08/13/2015 EXAM: CHEST  2 VIEW COMPARISON:  08/15/2015 FINDINGS: RIGHT jugular Port-A-Cath with tip projecting over SVC. Small caliber RIGHT chest tube noted. Volume loss in the RIGHT hemithorax with mediastinal shift to the RIGHT. Enlargement of cardiac silhouette with slight vascular congestion. Mediastinal contours stable. Persistent atelectasis and mass in RIGHT upper lobe. Persistent RIGHT pleural effusion and basilar atelectasis. Underlying emphysematous and bronchitic changes consistent with COPD. Mild LEFT basilar atelectasis. LEFT lung otherwise clear. No pneumothorax or acute bony findings. IMPRESSION: Persistent LEFT RIGHT upper lobe mass and volume loss. RIGHT pleural effusion and basilar atelectasis.  COPD changes with subsegmental atelectasis LEFT base. Electronically Signed   By: Lavonia Dana M.D.   On: 08/18/2015 21:29    ____________________________________________   PROCEDURES  Procedure(s) performed: None  Critical Care performed: No ____________________________________________   INITIAL IMPRESSION / ASSESSMENT AND PLAN / ED COURSE  Pertinent labs & imaging results that were available during my care of the patient were reviewed by me and considered in my medical decision making (see chart for details).  55 y.o. male with recent diagnosis of right upper lobe adenocarcinoma status post right pleural catheter presenting because he has been unable to drain his catheter due to not having the correct equipment, and a home health nurse concern of erythema as well as a temperature to 99.5. On my exam, the patient does not have any surrounding erythema or signs or symptoms of infection around his pleural catheter site. He does have some mild Rales in the right lung base, which is likely due to pleural effusion buildup and we will get the equipment necessary to drain the pleural catheter here, and discharged patient home with extra materials. The patient has not had a true fever, nor is he on chemotherapy, so will not pursue a more significant workup of his temperature of 99.5.  ----------------------------------------- 9:50 PM on 08/18/2015 -----------------------------------------  The patient continues to feel at baseline. He does not have any significant or acute changes in his chest x-ray and continues have a right pleural effusion. We've been able to obtain that was necessary to drain his effusion, and will do so with the family to show them how to do it. I have placed a consult call to the patient's oncology group, so that they understand why he was here today and make a final disposition which will likely be discharged with close  follow-up  ----------------------------------------- 10:17 PM on 08/18/2015 -----------------------------------------  Was able to speak with the nurse practitioner who is on-call for the oncology group tonight. She agrees with the plan and we'll touch base with the family tomorrow regarding the Pleurx catheters.  ____________________________________________  FINAL CLINICAL IMPRESSION(S) /  ED DIAGNOSES  Final diagnoses:  Complication of catheter, initial encounter  Pleural effusion, right      NEW MEDICATIONS STARTED DURING THIS VISIT:  New Prescriptions   No medications on file     Eula Listen, MD 08/18/15 2217

## 2015-08-18 NOTE — ED Notes (Signed)
Patients chest tubes and portacath is on the RIGHT side. In surgical history there is a error with placement side unable to edit this at this time.

## 2015-08-18 NOTE — ED Notes (Signed)
This RN and charge nurse at bedside. PlurX suction drainage attached to chest tube. Little output noted, red/clear.

## 2015-08-19 ENCOUNTER — Ambulatory Visit: Payer: Self-pay

## 2015-08-19 DIAGNOSIS — C3490 Malignant neoplasm of unspecified part of unspecified bronchus or lung: Secondary | ICD-10-CM

## 2015-08-19 LAB — COMP PANEL: LEUKEMIA/LYMPHOMA

## 2015-08-20 ENCOUNTER — Telehealth: Payer: Self-pay | Admitting: *Deleted

## 2015-08-20 ENCOUNTER — Telehealth: Payer: Self-pay

## 2015-08-20 ENCOUNTER — Ambulatory Visit
Admission: RE | Admit: 2015-08-20 | Discharge: 2015-08-20 | Disposition: A | Discharge status: Home / Self Care | Payer: PRIVATE HEALTH INSURANCE | Source: Ambulatory Visit | Attending: Hematology and Oncology | Admitting: Hematology and Oncology

## 2015-08-20 DIAGNOSIS — C781 Secondary malignant neoplasm of mediastinum: Secondary | ICD-10-CM | POA: Insufficient documentation

## 2015-08-20 DIAGNOSIS — C3490 Malignant neoplasm of unspecified part of unspecified bronchus or lung: Secondary | ICD-10-CM | POA: Diagnosis not present

## 2015-08-20 DIAGNOSIS — C7951 Secondary malignant neoplasm of bone: Secondary | ICD-10-CM | POA: Insufficient documentation

## 2015-08-20 DIAGNOSIS — R918 Other nonspecific abnormal finding of lung field: Secondary | ICD-10-CM | POA: Diagnosis present

## 2015-08-20 DIAGNOSIS — J910 Malignant pleural effusion: Secondary | ICD-10-CM | POA: Insufficient documentation

## 2015-08-20 LAB — GLUCOSE, CAPILLARY: Glucose-Capillary: 91 mg/dL (ref 65–99)

## 2015-08-20 MED ORDER — FLUDEOXYGLUCOSE F - 18 (FDG) INJECTION
12.4900 | Freq: Once | INTRAVENOUS | Status: AC | PRN
  Administered 2015-08-20: 12.49 via INTRAVENOUS

## 2015-08-20 NOTE — Telephone Encounter (Signed)
  Let's take care of tomorrow.  M

## 2015-08-20 NOTE — Telephone Encounter (Signed)
Patient's wife [name] walked in at this time and explains that she will need supplies and someone to educate her or come out to her home to drain the pleurex catheter.   Looking into chart, [place] is Home Health provider. Upon further explanation of the patient's wife, he was sent to the ED  On 08/20/15 and sent home the same day for redness around incision and shortness of breath. In ER, Pleurex was drained and dressing was changed. But, when she called back to [facility] to ask the nurse to come out, they stated that they had closed his case.  I called to [facility] at this time and spoke with Jenny Reichmann (Case Manager) whom explained that patient was sent to ER on 5/17 and the case was closed. I explained that the patient was not admitted and the patient needs in home care immediately as he is completely out of supplies and wife needs to be educated. Jenny Reichmann states that their agency had reached out to Dr. Jeananne Rama (PCP) and Dr. Posey Pronto (Hospitalist) on multiple occasions for orders and have not had any success with this. I explained to her that Dr. Genevive Bi would be the Physician that she would obtain orders from. She did change this in their computer system for patient. She requests that I fax her Physician orders to [phone] as soon as possible and they will have the patient seen today by nurse.

## 2015-08-20 NOTE — Telephone Encounter (Signed)
Call was made to Dr. Genevive Bi to obtain orders for patient.

## 2015-08-20 NOTE — Telephone Encounter (Signed)
Spoke with Dr. Genevive Bi and he explains that orders will be up to the discretion of the oncologist following the patient.   Call made to [employer] (Navigator) at this time regarding this. Message left with the information needed. Will await return phone call.

## 2015-08-20 NOTE — Telephone Encounter (Signed)
Received message from Dr. Genevive Bi office that [facility] needs orders for pleurex care from oncologist.

## 2015-08-21 ENCOUNTER — Inpatient Hospital Stay (HOSPITAL_BASED_OUTPATIENT_CLINIC_OR_DEPARTMENT_OTHER): Payer: PRIVATE HEALTH INSURANCE | Admitting: Hematology and Oncology

## 2015-08-21 ENCOUNTER — Inpatient Hospital Stay: Payer: PRIVATE HEALTH INSURANCE

## 2015-08-21 ENCOUNTER — Other Ambulatory Visit: Payer: Self-pay | Admitting: Hematology and Oncology

## 2015-08-21 ENCOUNTER — Inpatient Hospital Stay: Payer: PRIVATE HEALTH INSURANCE | Attending: Hematology and Oncology

## 2015-08-21 ENCOUNTER — Ambulatory Visit
Admission: RE | Admit: 2015-08-21 | Discharge: 2015-08-21 | Disposition: A | Discharge status: Home / Self Care | Payer: No Typology Code available for payment source | Source: Ambulatory Visit | Attending: Hematology and Oncology | Admitting: Hematology and Oncology

## 2015-08-21 ENCOUNTER — Telehealth: Payer: Self-pay | Admitting: *Deleted

## 2015-08-21 VITALS — BP 152/86 | HR 75 | Temp 93.5°F | Resp 18 | Ht 66.0 in | Wt 241.1 lb

## 2015-08-21 DIAGNOSIS — R05 Cough: Secondary | ICD-10-CM | POA: Insufficient documentation

## 2015-08-21 DIAGNOSIS — C7951 Secondary malignant neoplasm of bone: Secondary | ICD-10-CM | POA: Insufficient documentation

## 2015-08-21 DIAGNOSIS — C778 Secondary and unspecified malignant neoplasm of lymph nodes of multiple regions: Secondary | ICD-10-CM

## 2015-08-21 DIAGNOSIS — C3411 Malignant neoplasm of upper lobe, right bronchus or lung: Secondary | ICD-10-CM | POA: Insufficient documentation

## 2015-08-21 DIAGNOSIS — I10 Essential (primary) hypertension: Secondary | ICD-10-CM | POA: Diagnosis not present

## 2015-08-21 DIAGNOSIS — Z801 Family history of malignant neoplasm of trachea, bronchus and lung: Secondary | ICD-10-CM | POA: Diagnosis not present

## 2015-08-21 DIAGNOSIS — Z5112 Encounter for antineoplastic immunotherapy: Secondary | ICD-10-CM | POA: Insufficient documentation

## 2015-08-21 DIAGNOSIS — R112 Nausea with vomiting, unspecified: Secondary | ICD-10-CM | POA: Diagnosis not present

## 2015-08-21 DIAGNOSIS — C3491 Malignant neoplasm of unspecified part of right bronchus or lung: Secondary | ICD-10-CM

## 2015-08-21 DIAGNOSIS — Z79899 Other long term (current) drug therapy: Secondary | ICD-10-CM | POA: Insufficient documentation

## 2015-08-21 DIAGNOSIS — R634 Abnormal weight loss: Secondary | ICD-10-CM | POA: Insufficient documentation

## 2015-08-21 DIAGNOSIS — Z5111 Encounter for antineoplastic chemotherapy: Secondary | ICD-10-CM | POA: Insufficient documentation

## 2015-08-21 DIAGNOSIS — J910 Malignant pleural effusion: Secondary | ICD-10-CM

## 2015-08-21 DIAGNOSIS — M899 Disorder of bone, unspecified: Secondary | ICD-10-CM | POA: Diagnosis not present

## 2015-08-21 DIAGNOSIS — R5383 Other fatigue: Secondary | ICD-10-CM | POA: Diagnosis not present

## 2015-08-21 DIAGNOSIS — Z77098 Contact with and (suspected) exposure to other hazardous, chiefly nonmedicinal, chemicals: Secondary | ICD-10-CM | POA: Diagnosis not present

## 2015-08-21 DIAGNOSIS — R918 Other nonspecific abnormal finding of lung field: Secondary | ICD-10-CM

## 2015-08-21 LAB — CBC WITH DIFFERENTIAL/PLATELET
Basophils Absolute: 0 10*3/uL (ref 0–0.1)
Basophils Relative: 0 %
Eosinophils Absolute: 0.2 10*3/uL (ref 0–0.7)
Eosinophils Relative: 2 %
HCT: 40.3 % (ref 40.0–52.0)
Hemoglobin: 13.7 g/dL (ref 13.0–18.0)
Lymphocytes Relative: 10 %
Lymphs Abs: 1.1 10*3/uL (ref 1.0–3.6)
MCH: 30.6 pg (ref 26.0–34.0)
MCHC: 34 g/dL (ref 32.0–36.0)
MCV: 89.7 fL (ref 80.0–100.0)
Monocytes Absolute: 1.1 10*3/uL — ABNORMAL HIGH (ref 0.2–1.0)
Monocytes Relative: 10 %
Neutro Abs: 8.5 10*3/uL — ABNORMAL HIGH (ref 1.4–6.5)
Neutrophils Relative %: 78 %
Platelets: 311 10*3/uL (ref 150–440)
RBC: 4.49 MIL/uL (ref 4.40–5.90)
RDW: 12.8 % (ref 11.5–14.5)
WBC: 10.8 10*3/uL — ABNORMAL HIGH (ref 3.8–10.6)

## 2015-08-21 LAB — COMPREHENSIVE METABOLIC PANEL
ALT: 22 U/L (ref 17–63)
AST: 14 U/L — ABNORMAL LOW (ref 15–41)
Albumin: 3.2 g/dL — ABNORMAL LOW (ref 3.5–5.0)
Alkaline Phosphatase: 123 U/L (ref 38–126)
Anion gap: 6 (ref 5–15)
BUN: 15 mg/dL (ref 6–20)
CO2: 30 mmol/L (ref 22–32)
Calcium: 8.7 mg/dL — ABNORMAL LOW (ref 8.9–10.3)
Chloride: 98 mmol/L — ABNORMAL LOW (ref 101–111)
Creatinine, Ser: 0.96 mg/dL (ref 0.61–1.24)
GFR calc Af Amer: >60 mL/min (ref >60)
GFR calc non Af Amer: >60 mL/min (ref >60)
Glucose, Bld: 102 mg/dL — ABNORMAL HIGH (ref 65–99)
Potassium: 4.1 mmol/L (ref 3.5–5.1)
Sodium: 134 mmol/L — ABNORMAL LOW (ref 135–145)
Total Bilirubin: 0.4 mg/dL (ref 0.3–1.2)
Total Protein: 7.4 g/dL (ref 6.5–8.1)

## 2015-08-21 LAB — T4, FREE: Free T4: 1.01 ng/dL (ref 0.61–1.12)

## 2015-08-21 LAB — TSH: TSH: 1.813 u[IU]/mL (ref 0.350–4.500)

## 2015-08-21 LAB — MAGNESIUM: Magnesium: 2.4 mg/dL (ref 1.7–2.4)

## 2015-08-21 MED ORDER — DEXAMETHASONE 4 MG PO TABS
ORAL_TABLET | ORAL | Status: DC
Start: 2015-08-21 — End: 2016-04-15

## 2015-08-21 MED ORDER — LIDOCAINE-PRILOCAINE 2.5-2.5 % EX CREA: 1.0000 "application " | TOPICAL_CREAM | CUTANEOUS | Status: DC | PRN

## 2015-08-21 MED ORDER — ONDANSETRON HCL 8 MG PO TABS: 8.0000 mg | ORAL_TABLET | Freq: Three times a day (TID) | ORAL | Status: DC | PRN

## 2015-08-21 NOTE — Telephone Encounter (Signed)
Escribed

## 2015-08-21 NOTE — Progress Notes (Signed)
[facility] day:  08/21/2015  Chief Complaint: Stuart Spence is a 55 y.o. male with metastatic lung cancer who is seen for assessment after recent hospitalization prior to initiation of chemotherapy.  HPI:  The patient was admitted to [facility] from 08/10/2015 - 08/17/2015.  He presented with a month history of increasing shortness of breath and fatigue. He initially felt that he had a cold. Chest CT angiogram on 08/10/2015 revealed and ill-defined 4.5 x 4.5 cm area of hypodensity in the right upper lobe with small areas of air bronchogram. This was incompletely characterized but is concerning for a centrally obstructing mass. There was complete occlusion of the right upper, right middle, and right lower lobe bronchi with complete collapse of the right lung. There was a large right pleural effusion. There was a lytic lesion involving the right fourth rib most compatible with metastatic disease. There was no CT evidence of pulmonary embolism.  He underwent thoracentesis of 2.4 liters of brown serous fluid on 08/11/2015 and 2.3 liters of bloody fluid on 08/12/2015. Cytology revealed adenocarcinoma.  TTF-1 and Napsin A were immunoreactive and consistent with a lung primary.  Despite thoracentesis, he remained short of breath with oxygen saturations in the high 80s and low 90s. Chest CT on 08/12/2015 revealed a persistent right upper lobe mass concerning for bronchogenic carcinoma. The mass was along the apical segmental bronchi. There was a malignant right fourth rib lesions that was likely metastatic. There was atelectasis and re-expansion edema on the right.  He underwent right Pleurx catheter and port-a-cath insertion on 08/13/2015. The lung was adherent to the underside of the ribs in multiple locations. Post procedure, his oxygen needs increased. He required high flow oxygen for several days, but eventually was able to wean his oxygen down to 2  liters/min by nasal cannula.  He was treated with  Zosyn for a post-obstructive pneumonia.  He was seen in consultation during his admission.  We discussed treatment options with prior to discharge.  He received B12 injection and began folic acid 1 mg a day on 08/16/2015.  PET scan on 08/20/2015 revealed a hypermetabolic 5.2 cm medial right upper lobe lung mass, consistent with primary bronchogenic carcinoma, with a broad attachment to the medial right upper lobe pleura.  There was interlobular septal thickening throughout the right lung suggested a component of lymphangitic tumor.  There was a malignant small right pleural effusion with hypermetabolism throughout the right pleural space.  There was hypermetabolic ipsilateral hilar, subcarinal and ipsilateral mediastinal nodal metastases.  There was extensive hypermetabolic lytic osseous metastases throughout the axial and proximal appendicular skeleton.  Regarding the skeleton, there were numerous hypermetabolic faintly lytic osseous metastases in the left humeral head (SUV 6.8), right acromion, bilateral ribs most prominent in the anterior right fourth rib  (SUV 16.0), thoracolumbar spine (for example, T11 vertebral body with max SUV 9.4), bilateral iliac bones (medial right iliac bone with SUV 12.1 and left anterior acetabulum with SUV 10.0), and right proximal femur (SUV 10.0 in the region of the right lesser trochanter).  He has attended the chemotherapy class.  He is scheduled for head CT next week.  He was seen in the ER on 08/18/2015 for a low grade temperature of 99.5 and home health concerns about an infection around her PleurX catheter insertion site. Exam was unremarkable.  He was discharged home uneventfully.  Symptomatically, he feels "pretty good".  He denies any shortness of breath.  He has a productive  cough of clear thick mucus.   Past Medical History  Diagnosis Date  . Depression   . Gout   . ED (erectile dysfunction)   .  Hypertension     Past Surgical History  Procedure Laterality Date  . Tonsillectomy    . Portacath placement Left 08/13/2015    Procedure: INSERTION PORT-A-CATH;  Surgeon: Nestor Lewandowsky, MD;  Location: [facility] ORS;  Service: General;  Laterality: Left;  . Chest tube insertion Left 08/13/2015    Procedure: CHEST TUBE INSERTION;  Surgeon: Nestor Lewandowsky, MD;  Location: [facility] ORS;  Service: General;  Laterality: Left;  . Portacath placement      Family History  Problem Relation Age of Onset  . Cancer Mother 79    lung  . Heart attack Father 84  . Hypertension Father   . Congestive Heart Failure Father 88    died from  . Diabetes Sister     Social History:  reports that he has never smoked. He has never used smokeless tobacco. He reports that he does not drink alcohol or use illicit drugs.  He has worked 60 years in the [employer].He notes exposure to chemicals.   He recently switched jobs.  The patient is accompanied by his wife today.  Allergies: No Known Allergies  Current Medications: Current Outpatient Prescriptions  Medication Sig Dispense Refill  . albuterol (PROVENTIL HFA;VENTOLIN HFA) 108 (90 Base) MCG/ACT inhaler Inhale 1-2 puffs into the lungs See admin instructions. Inhale 1 to 2 puff by mouth every 4 to 6 hours as needed for difficulty breathing.    Marland Kitchen amLODipine (NORVASC) 10 MG tablet Take 1 tablet (10 mg total) by mouth daily. 30 tablet 6  . benazepril (LOTENSIN) 40 MG tablet Take 1 tablet (40 mg total) by mouth daily. 30 tablet 6  . colchicine 0.6 MG tablet Take 0.6 mg by mouth daily as needed (two pills by mouth at onset, the one pill one hour later, maximum three pills per gout flare).    Marland Kitchen FLUoxetine (PROZAC) 20 MG capsule Take 1 capsule (20 mg total) by mouth daily. 30 capsule 6  . folic acid (FOLVITE) 1 MG tablet Take 1 tablet (1 mg total) by mouth daily. 30 tablet 3  . sildenafil (VIAGRA) 100 MG tablet Take 1 tablet (100 mg total) by mouth daily as needed for  erectile dysfunction. 10 tablet 12  . HYDROcodone-acetaminophen (NORCO/VICODIN) 5-325 MG tablet Take 1 tablet by mouth every 6 (six) hours. Reported on 08/21/2015    . ondansetron (ZOFRAN) 4 MG tablet Take 4 mg by mouth every 6 (six) hours as needed for nausea or vomiting. Reported on 08/21/2015     No current facility-administered medications for this visit.    Review of Systems:  GENERAL: Fatigue. No fevers. Sweats at night for 1 month. No weight loss. Baseline weight 235-240 pounds. PERFORMANCE STATUS (ECOG): 2-3 HEENT: No visual changes, runny nose, sore throat, mouth sores or tenderness. Lungs: No shortness of breath. Clear thick productive cough. No hemoptysis. Cardiac: No chest pain, palpitations, orthopnea, or PND. GI: No nausea, vomiting, diarrhea, constipation, melena or hematochezia. No prior colonoscopy. GU: No urgency, frequency, dysuria, or hematuria. Musculoskeletal: No back pain. No joint pain. No muscle tenderness. Extremities: No pain or swelling. Skin: No rashes or skin changes. Neuro: No headache, numbness or weakness, balance or coordination issues. Endocrine: No diabetes, thyroid issues, hot flashes or night sweats. Psych: No mood changes, depression or anxiety. Pain: No focal pain. Review of systems: All other systems  reviewed and found to be negative.  Physical Exam: Blood pressure 152/86, pulse 75, temperature 93.5 F (34.2 C), temperature source Tympanic, resp. rate 18, height 5' 6"  (1.676 m), weight 241 lb 1.2 oz (109.35 kg), SpO2 100 %. GENERAL: Well developed, well nourished, heavyset gentleman sitting in the exam room in no acute distress. MENTAL STATUS: Alert and oriented to person, place and time. HEAD: Very short dark hair. Gray facial hair. Normocephalic, atraumatic, face symmetric, no Cushingoid features. EYES: Oval glasses. Pupils equal round and reactive to light and accomodation. No conjunctivitis or scleral  icterus. ENT: [address] 2 liters/min.  Oropharynx clear without lesion. Tongue normal. Mucous membranes moist.  RESPIRATORY: Decreased breath sounds right base with fine dry crackles. No wheezes or rhonchi. Right sided Pleurx catheter. CARDIOVASCULAR: Regular rate and rhythm without murmur, rub or gallop. CHEST:  Right port-a-cath. ABDOMEN: Soft, non-tender, with active bowel sounds, and no hepatosplenomegaly. No masses. SKIN: No rashes, ulcers or lesions. EXTREMITIES: No edema, no skin discoloration or tenderness. No palpable cords. LYMPH NODES: No palpable cervical, supraclavicular, axillary or inguinal adenopathy  NEUROLOGICAL: Unremarkable. PSYCH: Appropriate.   Appointment on 08/21/2015  Component Date Value Ref Range Status  . Magnesium 08/21/2015 2.4  1.7 - 2.4 mg/dL Final  . WBC 08/21/2015 10.8* 3.8 - 10.6 K/uL Final  . RBC 08/21/2015 4.49  4.40 - 5.90 MIL/uL Final  . Hemoglobin 08/21/2015 13.7  13.0 - 18.0 g/dL Final  . HCT 08/21/2015 40.3  40.0 - 52.0 % Final  . MCV 08/21/2015 89.7  80.0 - 100.0 fL Final  . MCH 08/21/2015 30.6  26.0 - 34.0 pg Final  . MCHC 08/21/2015 34.0  32.0 - 36.0 g/dL Final  . RDW 08/21/2015 12.8  11.5 - 14.5 % Final  . Platelets 08/21/2015 311  150 - 440 K/uL Final  . Neutrophils Relative % 08/21/2015 78   Final  . Neutro Abs 08/21/2015 8.5* 1.4 - 6.5 K/uL Final  . Lymphocytes Relative 08/21/2015 10   Final  . Lymphs Abs 08/21/2015 1.1  1.0 - 3.6 K/uL Final  . Monocytes Relative 08/21/2015 10   Final  . Monocytes Absolute 08/21/2015 1.1* 0.2 - 1.0 K/uL Final  . Eosinophils Relative 08/21/2015 2   Final  . Eosinophils Absolute 08/21/2015 0.2  0 - 0.7 K/uL Final  . Basophils Relative 08/21/2015 0   Final  . Basophils Absolute 08/21/2015 0.0  0 - 0.1 K/uL Final  . Sodium 08/21/2015 134* 135 - 145 mmol/L Final  . Potassium 08/21/2015 4.1  3.5 - 5.1 mmol/L Final  . Chloride 08/21/2015 98* 101 - 111 mmol/L Final  . CO2 08/21/2015 30  22 - 32  mmol/L Final  . Glucose, Bld 08/21/2015 102* 65 - 99 mg/dL Final  . BUN 08/21/2015 15  6 - 20 mg/dL Final  . Creatinine, Ser 08/21/2015 0.96  0.61 - 1.24 mg/dL Final  . Calcium 08/21/2015 8.7* 8.9 - 10.3 mg/dL Final  . Total Protein 08/21/2015 7.4  6.5 - 8.1 g/dL Final  . Albumin 08/21/2015 3.2* 3.5 - 5.0 g/dL Final  . AST 08/21/2015 14* 15 - 41 U/L Final  . ALT 08/21/2015 22  17 - 63 U/L Final  . Alkaline Phosphatase 08/21/2015 123  38 - 126 U/L Final  . Total Bilirubin 08/21/2015 0.4  0.3 - 1.2 mg/dL Final  . GFR calc non Af Amer 08/21/2015 >60  >60 mL/min Final  . GFR calc Af Amer 08/21/2015 >60  >60 mL/min Final   Comment: (NOTE) The eGFR  has been calculated using the CKD EPI equation. This calculation has not been validated in all clinical situations. eGFR's persistently <60 mL/min signify possible Chronic Kidney Disease.   Georgiann Hahn gap 08/21/2015 6  5 - 15 Final  Hospital Outpatient Visit on 08/20/2015  Component Date Value Ref Range Status  . Glucose-Capillary 08/20/2015 91  65 - 99 mg/dL Final    Assessment:  Elster Corbello is a 55 y.o. male with stage IV adenocarcinoma of the lung.  He has no smoking history.  He presented with with a large right sided pleural effusion and an ill-defined 4.5 x 4.5 cm mass in the right upper lobe. Thoracentesis x 2 of approximately 4.7 liters of blood fluid revealed adenocarcinoma. TTF-1 and Napsin A were immunoreactive and consistent with a lung primary. PleurX catheter was placed on 08/13/2015. He is on 2 liters/min oxygen.  Chest CT angiogram on 08/10/2015 revealed and ill-defined 4.5 x 4.5 cm area of hypodensity in the right upper lobe with small areas of air bronchogram. This was incompletely characterized but is concerning for a centrally obstructing mass. There was complete occlusion of the right upper, right middle, and right lower lobe bronchi with complete collapse of the right lung. There was a large right pleural effusion. There was a  lytic lesion involving the right fourth rib compatible with metastatic disease. There was no CT evidence of pulmonary embolism.  PET scan on 08/20/2015 revealed a hypermetabolic 5.2 cm medial right upper lobe lung mass, consistent with primary bronchogenic carcinoma, with a broad attachment to the medial right upper lobe pleura.  There was interlobular septal thickening throughout the right lung suggested a component of lymphangitic tumor.  There was a malignant small right pleural effusion with hypermetabolism throughout the right pleural space.  There was hypermetabolic ipsilateral hilar, subcarinal and ipsilateral mediastinal nodal metastases.  There was extensive hypermetabolic lytic osseous metastases throughout the axial and proximal appendicular skeleton.  Regarding the skeleton, there were numerous hypermetabolic faintly lytic osseous metastases in the left humeral head (SUV 6.8), right acromion, bilateral ribs most prominent in the anterior right fourth rib  (SUV 16.0), thoracolumbar spine (T11 vertebral body with SUV 9.4), bilateral iliac bones (medial right iliac bone with SUV 12.1 and left anterior acetabulum with SUV 10.0), and right proximal femur (SUV 10.0 in the region of the right lesser trochanter).  Symptomatically, he has a cough.  He denies any shortness of breath.  He denies bone pain.  Plan: 1.  Review PET scan in detail.  Images reviewed with patient and his wife. 2.  Labs today:  CBC with diff, CMP, ACTH, TSH, free T4.  3.  Discuss chemotherapy plan with Keytruda + carboplatin and Alimta every 3 weeks for 4 cycles then Keytruda alone for 2 years.  Side effects of chemotherapy reviewed.  Re-review the data associated with the recently FDA approved combination of pembrolizumab [facility]) with Alimta and carboplatin based on the KEYNOTE-021 trial. Response rates were 55% compared to 29% with chemotherapy alone. Progression free survival was 13 months with the addition of Keytruda  versus 8.9 months with chemotherapy alone. Median time to response was 1.5 months with Keytruda compared to 2.7 months with chemotherapy alone.  Patient consented to treatment. 4.  Discuss Decadron 4 mg BID the day before and after chemotherapy.  Discuss continuation of folic acid every day.  Discuss B12 every 9 weeks. 5.  Discuss management of nausea. 6.  Discuss management of bone metastasis.  Discuss plain films to ensure no  bones at risk for fracture.  Discuss Xgeva.  Side effects reviewed including potential hypocalcemia, renal dysfunction, and osteonecrosis of the jaw were reviewed.  Patient consented to [facility]. 7.  Plain films of weight bearing bones. 8.  Rx:  Decadron, ondansetron, EMLA cream. 9.  Head CT on 08/27/2015- baseline. 10.  Follow-up additional testing (EGFR, ALK, ROS1, PDL-1) 11.  RTC on 08/22/2015 for Keytruda, carboplatin, Alimta, and Xgeva. 12.  RTC on 08/28/2015 for MD assessment and labs (CBC with diff, BMP).  Lequita Asal, MD  08/21/2015, 12:03 PM

## 2015-08-21 NOTE — Telephone Encounter (Signed)
Patients wife, [name] called at the end of clinic yesterday evening to check on the status of the Home Health Nurse. While I placed her on hold and called [facility] to ascertain what was being done regarding patient and [place] the nurse had called the patients wife. I switched back over and she told me a [place], Amy Altamese [place] would be coming out that evening 08/20/15 at 6:30pm.

## 2015-08-21 NOTE — Progress Notes (Signed)
Pt reports he will need a prescription for EMLA cream and Zofran.  Pt currently on 2L O2 no SOB.  Pt reports having a cough and will cough up thick clear mucus.  However, reports it is related to BP medication his cough

## 2015-08-21 NOTE — Telephone Encounter (Signed)
Pt seen today in clinic and home health order written and [facility]ed to [facility].  Also faxed orders to [facility] to get pleurx drainage containers

## 2015-08-22 ENCOUNTER — Ambulatory Visit: Payer: Self-pay | Admitting: Hematology and Oncology

## 2015-08-22 ENCOUNTER — Other Ambulatory Visit: Payer: Self-pay | Admitting: Hematology and Oncology

## 2015-08-22 ENCOUNTER — Ambulatory Visit: Payer: Self-pay

## 2015-08-22 ENCOUNTER — Inpatient Hospital Stay: Payer: PRIVATE HEALTH INSURANCE

## 2015-08-22 ENCOUNTER — Other Ambulatory Visit: Payer: Self-pay

## 2015-08-22 VITALS — BP 133/72 | HR 74 | Resp 20

## 2015-08-22 DIAGNOSIS — Z5111 Encounter for antineoplastic chemotherapy: Secondary | ICD-10-CM | POA: Diagnosis not present

## 2015-08-22 DIAGNOSIS — C3491 Malignant neoplasm of unspecified part of right bronchus or lung: Secondary | ICD-10-CM

## 2015-08-22 DIAGNOSIS — C7951 Secondary malignant neoplasm of bone: Secondary | ICD-10-CM

## 2015-08-22 DIAGNOSIS — R918 Other nonspecific abnormal finding of lung field: Secondary | ICD-10-CM

## 2015-08-22 LAB — ACTH: C206 ACTH: 15.4 pg/mL (ref 7.2–63.3)

## 2015-08-22 MED ORDER — SODIUM CHLORIDE 0.9 % IV SOLN
200.0000 mg | Freq: Once | INTRAVENOUS | Status: AC
  Administered 2015-08-22: 200 mg via INTRAVENOUS
  Filled 2015-08-22: qty 8

## 2015-08-22 MED ORDER — PALONOSETRON HCL INJECTION 0.25 MG/5ML
0.2500 mg | Freq: Once | INTRAVENOUS | Status: AC
  Administered 2015-08-22: 0.25 mg via INTRAVENOUS
  Filled 2015-08-22: qty 5

## 2015-08-22 MED ORDER — HEPARIN SOD (PORK) LOCK FLUSH 100 UNIT/ML IV SOLN
500.0000 [IU] | Freq: Once | INTRAVENOUS | Status: AC | PRN
  Administered 2015-08-22: 500 [IU]
  Filled 2015-08-22: qty 5

## 2015-08-22 MED ORDER — SODIUM CHLORIDE 0.9 % IV SOLN
Freq: Once | INTRAVENOUS | Status: AC
  Administered 2015-08-22: 10:00:00 via INTRAVENOUS
  Filled 2015-08-22: qty 1000

## 2015-08-22 MED ORDER — DENOSUMAB 120 MG/1.7ML ~~LOC~~ SOLN
120.0000 mg | Freq: Once | SUBCUTANEOUS | Status: AC
  Administered 2015-08-22: 120 mg via SUBCUTANEOUS
  Filled 2015-08-22: qty 1.7

## 2015-08-22 MED ORDER — SODIUM CHLORIDE 0.9 % IV SOLN
10.0000 mg | Freq: Once | INTRAVENOUS | Status: AC
  Administered 2015-08-22: 10 mg via INTRAVENOUS
  Filled 2015-08-22 (×2): qty 1

## 2015-08-22 MED ORDER — SODIUM CHLORIDE 0.9 % IV SOLN
1100.0000 mg | Freq: Once | INTRAVENOUS | Status: AC
  Administered 2015-08-22: 1100 mg via INTRAVENOUS
  Filled 2015-08-22: qty 44

## 2015-08-22 MED ORDER — SODIUM CHLORIDE 0.9 % IV SOLN
750.0000 mg | Freq: Once | INTRAVENOUS | Status: AC
  Administered 2015-08-22: 750 mg via INTRAVENOUS
  Filled 2015-08-22: qty 75

## 2015-08-23 ENCOUNTER — Encounter: Payer: Self-pay | Admitting: Hematology and Oncology

## 2015-08-25 ENCOUNTER — Inpatient Hospital Stay: Payer: Self-pay | Admitting: Hematology and Oncology

## 2015-08-26 ENCOUNTER — Inpatient Hospital Stay: Payer: Self-pay | Admitting: Internal Medicine

## 2015-08-27 ENCOUNTER — Ambulatory Visit
Admission: RE | Admit: 2015-08-27 | Discharge: 2015-08-27 | Disposition: A | Discharge status: Home / Self Care | Payer: PRIVATE HEALTH INSURANCE | Source: Ambulatory Visit | Attending: Hematology and Oncology | Admitting: Hematology and Oncology

## 2015-08-27 DIAGNOSIS — R918 Other nonspecific abnormal finding of lung field: Secondary | ICD-10-CM | POA: Diagnosis present

## 2015-08-27 HISTORY — DX: Malignant (primary) neoplasm, unspecified: C80.1

## 2015-08-27 MED ORDER — IOPAMIDOL (ISOVUE-300) INJECTION 61%
75.0000 mL | Freq: Once | INTRAVENOUS | Status: AC | PRN
  Administered 2015-08-27: 75 mL via INTRAVENOUS

## 2015-08-28 ENCOUNTER — Inpatient Hospital Stay (HOSPITAL_BASED_OUTPATIENT_CLINIC_OR_DEPARTMENT_OTHER): Payer: PRIVATE HEALTH INSURANCE | Admitting: Hematology and Oncology

## 2015-08-28 ENCOUNTER — Ambulatory Visit
Admission: RE | Admit: 2015-08-28 | Discharge: 2015-08-28 | Disposition: A | Discharge status: Home / Self Care | Payer: No Typology Code available for payment source | Source: Ambulatory Visit | Attending: Hematology and Oncology | Admitting: Hematology and Oncology

## 2015-08-28 ENCOUNTER — Inpatient Hospital Stay: Payer: PRIVATE HEALTH INSURANCE

## 2015-08-28 VITALS — BP 122/78 | HR 89 | Temp 95.3°F | Resp 22 | Ht 66.0 in | Wt 235.9 lb

## 2015-08-28 DIAGNOSIS — C3491 Malignant neoplasm of unspecified part of right bronchus or lung: Secondary | ICD-10-CM

## 2015-08-28 DIAGNOSIS — C3411 Malignant neoplasm of upper lobe, right bronchus or lung: Secondary | ICD-10-CM

## 2015-08-28 DIAGNOSIS — Z978 Presence of other specified devices: Secondary | ICD-10-CM | POA: Insufficient documentation

## 2015-08-28 DIAGNOSIS — Z77098 Contact with and (suspected) exposure to other hazardous, chiefly nonmedicinal, chemicals: Secondary | ICD-10-CM

## 2015-08-28 DIAGNOSIS — I10 Essential (primary) hypertension: Secondary | ICD-10-CM

## 2015-08-28 DIAGNOSIS — C7951 Secondary malignant neoplasm of bone: Secondary | ICD-10-CM

## 2015-08-28 DIAGNOSIS — R112 Nausea with vomiting, unspecified: Secondary | ICD-10-CM

## 2015-08-28 DIAGNOSIS — J90 Pleural effusion, not elsewhere classified: Secondary | ICD-10-CM | POA: Insufficient documentation

## 2015-08-28 DIAGNOSIS — Z79899 Other long term (current) drug therapy: Secondary | ICD-10-CM

## 2015-08-28 DIAGNOSIS — Z5111 Encounter for antineoplastic chemotherapy: Secondary | ICD-10-CM | POA: Diagnosis not present

## 2015-08-28 DIAGNOSIS — C778 Secondary and unspecified malignant neoplasm of lymph nodes of multiple regions: Secondary | ICD-10-CM

## 2015-08-28 DIAGNOSIS — R634 Abnormal weight loss: Secondary | ICD-10-CM

## 2015-08-28 DIAGNOSIS — J910 Malignant pleural effusion: Secondary | ICD-10-CM

## 2015-08-28 LAB — BASIC METABOLIC PANEL WITH GFR
Anion gap: 6 (ref 5–15)
BUN: 15 mg/dL (ref 6–20)
CO2: 28 mmol/L (ref 22–32)
Calcium: 7.9 mg/dL — ABNORMAL LOW (ref 8.9–10.3)
Chloride: 95 mmol/L — ABNORMAL LOW (ref 101–111)
Creatinine, Ser: 1 mg/dL (ref 0.61–1.24)
GFR calc Af Amer: >60 mL/min
GFR calc non Af Amer: >60 mL/min
Glucose, Bld: 103 mg/dL — ABNORMAL HIGH (ref 65–99)
Potassium: 4.2 mmol/L (ref 3.5–5.1)
Sodium: 129 mmol/L — ABNORMAL LOW (ref 135–145)

## 2015-08-28 LAB — CBC WITH DIFFERENTIAL/PLATELET
Basophils Absolute: 0 K/uL (ref 0–0.1)
Basophils Relative: 1 %
Eosinophils Absolute: 0.2 K/uL (ref 0–0.7)
Eosinophils Relative: 4 %
HCT: 36.5 % — ABNORMAL LOW (ref 40.0–52.0)
Hemoglobin: 12.6 g/dL — ABNORMAL LOW (ref 13.0–18.0)
Lymphocytes Relative: 13 %
Lymphs Abs: 0.7 K/uL — ABNORMAL LOW (ref 1.0–3.6)
MCH: 30.5 pg (ref 26.0–34.0)
MCHC: 34.4 g/dL (ref 32.0–36.0)
MCV: 88.5 fL (ref 80.0–100.0)
Monocytes Absolute: 0.3 K/uL (ref 0.2–1.0)
Monocytes Relative: 6 %
Neutro Abs: 4.3 K/uL (ref 1.4–6.5)
Neutrophils Relative %: 76 %
Platelets: 198 K/uL (ref 150–440)
RBC: 4.12 MIL/uL — ABNORMAL LOW (ref 4.40–5.90)
RDW: 12.9 % (ref 11.5–14.5)
WBC: 5.5 K/uL (ref 3.8–10.6)

## 2015-08-28 NOTE — Progress Notes (Signed)
Took stitches out from portacath insertion-site was dry , no redness-steristrips applied per dr Genevive Bi instructions. Stitches removed from the placement of chest tube- site was pink, dry and no drainage and steristrips applied per dr Genevive Bi instructions.  Dr. Genevive Bi saw all surgical sites including the  pleurx catheter and it was slighty red, minute amount dries blood around one stitch and area cleaned and dressing changed.  Pt does have some irritation above where the suture sites were it due to tape irritation. Dressing applied avoiding that area.  Pt was told to let steristrips come off by themselves as time goes on the will fall completely off.

## 2015-08-28 NOTE — Progress Notes (Signed)
[facility] day:  08/28/2015  Chief Complaint: Stuart Spence is a 55 y.o. male with metastatic lung cancer who is seen for assessment on day 7 of cycle #1 Keytruda plus carboplatin and Alimta.  HPI:  The patient was last seen in the medical oncology clinic on 08/21/2015.  At that time, he was seen prior to initiation of chemotherapy.  He received cycle #1 Keytruda, carboplatin, Alimta, and Xgeva on 08/22/2015.  He describes 2 episodes of nausea and vomiting.  Ondansetron improved his symptoms.  Head CT with and without contrast on 08/27/2015 revealed no evidence of metastatic disease.  Plain films on 08/21/2015 revealed a left humerus lytic lesion in the left humeral neck medially.  There was thinning and some absence of the cortical margin at that site.  Hip and pelvis were negative.  Right femur films revealed a 4 mm lytic lesion within the lesser trochanter and subtle lytic abnormality within the medial mid right femoral cortex.  Symptomatically, he feels good.  He remains on oxygen 2 liters /min via nasal cannulae.  He denies any shortness of breath.  Minimal fluid was removed from his PleurX catheter on 08/24/2015.  He denies any pain.   Past Medical History  Diagnosis Date  . Depression   . Gout   . ED (erectile dysfunction)   . Hypertension   . Cancer [facility])     Lung    Past Surgical History  Procedure Laterality Date  . Tonsillectomy    . Portacath placement Left 08/13/2015    Procedure: INSERTION PORT-A-CATH;  Surgeon: Nestor Lewandowsky, MD;  Location: [facility] ORS;  Service: General;  Laterality: Left;  . Chest tube insertion Left 08/13/2015    Procedure: CHEST TUBE INSERTION;  Surgeon: Nestor Lewandowsky, MD;  Location: [facility] ORS;  Service: General;  Laterality: Left;  . Portacath placement      Family History  Problem Relation Age of Onset  . Cancer Mother 71    lung  . Heart attack Father 61  . Hypertension Father   . Congestive Heart  Failure Father 32    died from  . Diabetes Sister     Social History:  reports that he has never smoked. He has never used smokeless tobacco. He reports that he does not drink alcohol or use illicit drugs.  He has worked 63 years in the [employer].He notes exposure to chemicals.   He recently switched jobs.  The patient is accompanied by his wife, sister, and daughter today.  Allergies: No Known Allergies  Current Medications: Current Outpatient Prescriptions  Medication Sig Dispense Refill  . albuterol (PROVENTIL HFA;VENTOLIN HFA) 108 (90 Base) MCG/ACT inhaler Inhale 1-2 puffs into the lungs See admin instructions. Inhale 1 to 2 puff by mouth every 4 to 6 hours as needed for difficulty breathing.    Marland Kitchen amLODipine (NORVASC) 10 MG tablet Take 1 tablet (10 mg total) by mouth daily. 30 tablet 6  . benazepril (LOTENSIN) 40 MG tablet Take 1 tablet (40 mg total) by mouth daily. 30 tablet 6  . colchicine 0.6 MG tablet Take 0.6 mg by mouth daily as needed (two pills by mouth at onset, the one pill one hour later, maximum three pills per gout flare).    Marland Kitchen dexamethasone (DECADRON) 4 MG tablet 4 mg tablet twice a day the day before chemo, and the day after chemo with each regimen of carbo/alimta 36 tablet 0  . FLUoxetine (PROZAC) 20 MG capsule Take 1  capsule (20 mg total) by mouth daily. 30 capsule 6  . folic acid (FOLVITE) 1 MG tablet Take 1 tablet (1 mg total) by mouth daily. 30 tablet 3  . HYDROcodone-acetaminophen (NORCO/VICODIN) 5-325 MG tablet Take 1 tablet by mouth every 6 (six) hours. Reported on 08/21/2015    . lidocaine-prilocaine (EMLA) cream Apply 1 application topically as needed. 30 g 2  . ondansetron (ZOFRAN) 8 MG tablet Take 1 tablet (8 mg total) by mouth every 8 (eight) hours as needed for nausea or vomiting. 20 tablet 3  . sildenafil (VIAGRA) 100 MG tablet Take 1 tablet (100 mg total) by mouth daily as needed for erectile dysfunction. 10 tablet 12   No current  facility-administered medications for this visit.    Review of Systems:  GENERAL: Feels good. No fevers. Weight loss of 6 pounds. Baseline weight 235-240 pounds. PERFORMANCE STATUS (ECOG): 2-3 HEENT: No visual changes, runny nose, sore throat, mouth sores or tenderness. Lungs: No shortness of breath. No cough. No hemoptysis.  Minimal fluid out of PleurX catheter on 08/24/2015. Cardiac: No chest pain, palpitations, orthopnea, or PND. GI: 2 episodes of nausea and vomting.  No nausea, vomiting, diarrhea, constipation, melena or hematochezia. No prior colonoscopy. GU: No urgency, frequency, dysuria, or hematuria. Musculoskeletal: No back pain. No joint pain. No muscle tenderness. Extremities: No pain or swelling. Skin: No rashes or skin changes. Neuro: No headache, numbness or weakness, balance or coordination issues. Endocrine: No diabetes, thyroid issues, hot flashes or night sweats. Psych: No mood changes, depression or anxiety. Pain: No focal pain. Review of systems: All other systems reviewed and found to be negative.  Physical Exam: Blood pressure 122/78, pulse 89, temperature 95.3 F (35.2 C), temperature source Tympanic, resp. rate 22, height 5' 6"  (1.676 m), weight 235 lb 14.3 oz (107 kg), SpO2 95 %. GENERAL: Well developed, well nourished, heavyset gentleman sitting in the exam room in no acute distress. MENTAL STATUS: Alert and oriented to person, place and time. HEAD: Very short dark hair. Gray facial hair. Normocephalic, atraumatic, face symmetric, no Cushingoid features. EYES: Oval glasses. Pupils equal round and reactive to light and accomodation. No conjunctivitis or scleral icterus. ENT: [place] 2 liters/min.  Oropharynx clear without lesion. Tongue normal. Mucous membranes moist.  RESPIRATORY: Decreased breath sounds right base. No wheezes or rhonchi. Right sided Pleurx catheter. CARDIOVASCULAR: Regular rate and rhythm without murmur, rub or  gallop. CHEST:  Right port-a-cath. Stitches in place. ABDOMEN: Soft, non-tender, with active bowel sounds, and no hepatosplenomegaly. No masses. SKIN: No rashes, ulcers or lesions. EXTREMITIES: 1+ lower extremity edema.  No skin discoloration or tenderness. No palpable cords. LYMPH NODES: No palpable cervical, supraclavicular, axillary or inguinal adenopathy  NEUROLOGICAL: Unremarkable. PSYCH: Appropriate.   Appointment on 08/28/2015  Component Date Value Ref Range Status  . WBC 08/28/2015 5.5  3.8 - 10.6 K/uL Final  . RBC 08/28/2015 4.12* 4.40 - 5.90 MIL/uL Final  . Hemoglobin 08/28/2015 12.6* 13.0 - 18.0 g/dL Final  . HCT 08/28/2015 36.5* 40.0 - 52.0 % Final  . MCV 08/28/2015 88.5  80.0 - 100.0 fL Final  . MCH 08/28/2015 30.5  26.0 - 34.0 pg Final  . MCHC 08/28/2015 34.4  32.0 - 36.0 g/dL Final  . RDW 08/28/2015 12.9  11.5 - 14.5 % Final  . Platelets 08/28/2015 198  150 - 440 K/uL Final  . Neutrophils Relative % 08/28/2015 76   Final  . Neutro Abs 08/28/2015 4.3  1.4 - 6.5 K/uL Final  . Lymphocytes Relative  08/28/2015 13   Final  . Lymphs Abs 08/28/2015 0.7* 1.0 - 3.6 K/uL Final  . Monocytes Relative 08/28/2015 6   Final  . Monocytes Absolute 08/28/2015 0.3  0.2 - 1.0 K/uL Final  . Eosinophils Relative 08/28/2015 4   Final  . Eosinophils Absolute 08/28/2015 0.2  0 - 0.7 K/uL Final  . Basophils Relative 08/28/2015 1   Final  . Basophils Absolute 08/28/2015 0.0  0 - 0.1 K/uL Final  . Sodium 08/28/2015 129* 135 - 145 mmol/L Final  . Potassium 08/28/2015 4.2  3.5 - 5.1 mmol/L Final  . Chloride 08/28/2015 95* 101 - 111 mmol/L Final  . CO2 08/28/2015 28  22 - 32 mmol/L Final  . Glucose, Bld 08/28/2015 103* 65 - 99 mg/dL Final  . BUN 08/28/2015 15  6 - 20 mg/dL Final  . Creatinine, Ser 08/28/2015 1.00  0.61 - 1.24 mg/dL Final  . Calcium 08/28/2015 7.9* 8.9 - 10.3 mg/dL Final  . GFR calc non Af Amer 08/28/2015 >60  >60 mL/min Final  . GFR calc Af Amer 08/28/2015 >60  >60 mL/min  Final   Comment: (NOTE) The eGFR has been calculated using the CKD EPI equation. This calculation has not been validated in all clinical situations. eGFR's persistently <60 mL/min signify possible Chronic Kidney Disease.   Georgiann Hahn gap 08/28/2015 6  5 - 15 Final    Assessment:  Stuart Spence is a 55 y.o. male with stage IV adenocarcinoma of the lung.  He has no smoking history.  He presented with with a large right sided pleural effusion and an ill-defined 4.5 x 4.5 cm mass in the right upper lobe. Thoracentesis x 2 of approximately 4.7 liters of blood fluid revealed adenocarcinoma. TTF-1 and Napsin A were immunoreactive and consistent with a lung primary. PleurX catheter was placed on 08/13/2015. He is on 2 liters/min oxygen.  Chest CT angiogram on 08/10/2015 revealed and ill-defined 4.5 x 4.5 cm area of hypodensity in the right upper lobe with small areas of air bronchogram. This was incompletely characterized but is concerning for a centrally obstructing mass. There was complete occlusion of the right upper, right middle, and right lower lobe bronchi with complete collapse of the right lung. There was a large right pleural effusion. There was a lytic lesion involving the right fourth rib compatible with metastatic disease. There was no CT evidence of pulmonary embolism.  PET scan on 08/20/2015 revealed a hypermetabolic 5.2 cm medial right upper lobe lung mass, consistent with primary bronchogenic carcinoma, with a broad attachment to the medial right upper lobe pleura.  There was interlobular septal thickening throughout the right lung suggested a component of lymphangitic tumor.  There was a malignant small right pleural effusion with hypermetabolism throughout the right pleural space.  There was hypermetabolic ipsilateral hilar, subcarinal and ipsilateral mediastinal nodal metastases.  There was extensive hypermetabolic lytic osseous metastases throughout the axial and proximal appendicular  skeleton.    Regarding the skeleton, there were numerous hypermetabolic faintly lytic osseous metastases in the left humeral head (SUV 6.8), right acromion, bilateral ribs most prominent in the anterior right fourth rib  (SUV 16.0), thoracolumbar spine (T11 vertebral body with SUV 9.4), bilateral iliac bones (medial right iliac bone with SUV 12.1 and left anterior acetabulum with SUV 10.0), and right proximal femur (SUV 10.0 in the region of the right lesser trochanter).  Plain films on   Plain films on 08/21/2015 revealed a left humerus lytic lesion with some thinning and some absence  of the cortical margin.  He is day 8 s/p cycle #1 cycle #1 Keytruda, carboplatin, Alimta (08/22/2015).  He began monthly Xgeva on 08/22/2015.  He tolerated his chemotherapy well.  Symptomatically, he feels good.  He has had little output from his PleurX catheter. Exam reveals bilateral 1+ lower extremity edema.  Calcium is 7.9 (corrected 8.54).  He has a mild normocytic anemia.  Plan: 1.  Labs today:  CBC with diff, BMP. 2.  Review head CT and plain films.  Discuss left humeral lesion with Dr. Baruch Gouty of radiation oncology. 3.  Review overall treatment plan (4 cycles of carboplatin, Alimta + Keytruda; reimaging then likely ongoing Keytruda).  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  Discus calcium and vitamin D. 4.  CXR (PA and lateral)- f/u pleural effusion. 5.  Follow-up additional testing (EGFR, ALK, ROS1, PDL-1). 6.  Discuss plan for PleurX drainage once a week.  If minimal fluid out, consider removal of PleurX catheter. 7.  RTC in 1 week for labs (CBC with diff, BMP, albumen, ferritin, iron studies, B12, folate, retic). 8.  RTC on 09/12/2015 for MD assessment, labs (CBC with diff, CMP, Mg, CEA, LDH), and cycle #2 Keytruda, carboplatin, and Alimta   Lequita Asal, MD  08/28/2015, 2:51 PM

## 2015-08-28 NOTE — Progress Notes (Signed)
Pt remains no 2L o2 via [place] no complaints of SOB.  Sats 95 on 2L.  Pt complains today of irritation tape and stitches on right side.  Pt complains he had nausea and vomiting x2 last Tuesday.  Took Zofran and felt better.  Pts wife verbalizes that pts feet are swelling with no pain.  Concerned about when stitches should be removed?  Will he need to continue to have tube?

## 2015-08-30 ENCOUNTER — Encounter: Payer: Self-pay | Admitting: Hematology and Oncology

## 2015-09-02 ENCOUNTER — Telehealth: Payer: Self-pay | Admitting: Cardiothoracic Surgery

## 2015-09-02 NOTE — Telephone Encounter (Deleted)
Patient's wife has called and would like

## 2015-09-02 NOTE — Telephone Encounter (Signed)
Appointment made with Dr. Genevive Bi for 09/09/15.

## 2015-09-02 NOTE — Telephone Encounter (Signed)
Patient's wife has called and stated that they were advised to call to find out what the Chest Xray results were from 08/28/15 to see if the patient would be able to have his Pleurx cath removed. Imaging results are available. I have advised the patient's wife that once these are reviewed by the surgeon that we will have the nurse to call patient to discuss next step.

## 2015-09-03 ENCOUNTER — Telehealth: Payer: Self-pay | Admitting: *Deleted

## 2015-09-03 NOTE — Telephone Encounter (Signed)
Called pt's wife and let her know that I got the appt for pt to see dr Baruch Gouty to talk about radiation to humerus for fracture risk.  It is 6/5 10:30 and they will make it.

## 2015-09-04 ENCOUNTER — Ambulatory Visit (INDEPENDENT_AMBULATORY_CARE_PROVIDER_SITE_OTHER): Payer: PRIVATE HEALTH INSURANCE | Admitting: Family Medicine

## 2015-09-04 ENCOUNTER — Encounter: Payer: Self-pay | Admitting: Family Medicine

## 2015-09-04 ENCOUNTER — Inpatient Hospital Stay: Payer: PRIVATE HEALTH INSURANCE | Attending: Hematology and Oncology

## 2015-09-04 VITALS — BP 108/71 | HR 82 | Temp 97.8°F | Ht 66.0 in | Wt 240.0 lb

## 2015-09-04 DIAGNOSIS — R739 Hyperglycemia, unspecified: Secondary | ICD-10-CM | POA: Insufficient documentation

## 2015-09-04 DIAGNOSIS — C778 Secondary and unspecified malignant neoplasm of lymph nodes of multiple regions: Secondary | ICD-10-CM | POA: Insufficient documentation

## 2015-09-04 DIAGNOSIS — F329 Major depressive disorder, single episode, unspecified: Secondary | ICD-10-CM | POA: Diagnosis not present

## 2015-09-04 DIAGNOSIS — Z79899 Other long term (current) drug therapy: Secondary | ICD-10-CM | POA: Insufficient documentation

## 2015-09-04 DIAGNOSIS — Z5112 Encounter for antineoplastic immunotherapy: Secondary | ICD-10-CM | POA: Diagnosis not present

## 2015-09-04 DIAGNOSIS — Z5111 Encounter for antineoplastic chemotherapy: Secondary | ICD-10-CM | POA: Insufficient documentation

## 2015-09-04 DIAGNOSIS — I10 Essential (primary) hypertension: Secondary | ICD-10-CM

## 2015-09-04 DIAGNOSIS — C7951 Secondary malignant neoplasm of bone: Secondary | ICD-10-CM | POA: Diagnosis not present

## 2015-09-04 DIAGNOSIS — R600 Localized edema: Secondary | ICD-10-CM | POA: Insufficient documentation

## 2015-09-04 DIAGNOSIS — C3411 Malignant neoplasm of upper lobe, right bronchus or lung: Secondary | ICD-10-CM | POA: Insufficient documentation

## 2015-09-04 DIAGNOSIS — F32A Depression, unspecified: Secondary | ICD-10-CM

## 2015-09-04 DIAGNOSIS — C3491 Malignant neoplasm of unspecified part of right bronchus or lung: Secondary | ICD-10-CM

## 2015-09-04 DIAGNOSIS — Z9981 Dependence on supplemental oxygen: Secondary | ICD-10-CM | POA: Insufficient documentation

## 2015-09-04 DIAGNOSIS — Z801 Family history of malignant neoplasm of trachea, bronchus and lung: Secondary | ICD-10-CM | POA: Diagnosis not present

## 2015-09-04 DIAGNOSIS — J910 Malignant pleural effusion: Secondary | ICD-10-CM | POA: Diagnosis not present

## 2015-09-04 LAB — CBC WITH DIFFERENTIAL/PLATELET
Basophils Absolute: 0 10*3/uL (ref 0–0.1)
Basophils Relative: 0 %
Eosinophils Absolute: 0.1 10*3/uL (ref 0–0.7)
Eosinophils Relative: 4 %
HCT: 37 % — ABNORMAL LOW (ref 40.0–52.0)
Hemoglobin: 12.6 g/dL — ABNORMAL LOW (ref 13.0–18.0)
Lymphocytes Relative: 22 %
Lymphs Abs: 0.7 10*3/uL — ABNORMAL LOW (ref 1.0–3.6)
MCH: 30 pg (ref 26.0–34.0)
MCHC: 34 g/dL (ref 32.0–36.0)
MCV: 88.3 fL (ref 80.0–100.0)
Monocytes Absolute: 0.5 10*3/uL (ref 0.2–1.0)
Monocytes Relative: 15 %
Neutro Abs: 1.8 10*3/uL (ref 1.4–6.5)
Neutrophils Relative %: 59 %
Platelets: 192 10*3/uL (ref 150–440)
RBC: 4.19 MIL/uL — ABNORMAL LOW (ref 4.40–5.90)
RDW: 13.6 % (ref 11.5–14.5)
WBC: 3.1 10*3/uL — ABNORMAL LOW (ref 3.8–10.6)

## 2015-09-04 LAB — BASIC METABOLIC PANEL
Anion gap: 8 (ref 5–15)
BUN: 12 mg/dL (ref 6–20)
CO2: 29 mmol/L (ref 22–32)
Calcium: 9 mg/dL (ref 8.9–10.3)
Chloride: 98 mmol/L — ABNORMAL LOW (ref 101–111)
Creatinine, Ser: 0.98 mg/dL (ref 0.61–1.24)
GFR calc Af Amer: >60 mL/min (ref >60)
GFR calc non Af Amer: >60 mL/min (ref >60)
Glucose, Bld: 130 mg/dL — ABNORMAL HIGH (ref 65–99)
Potassium: 4.2 mmol/L (ref 3.5–5.1)
Sodium: 135 mmol/L (ref 135–145)

## 2015-09-04 LAB — IRON AND TIBC
Iron: 45 ug/dL (ref 45–182)
Saturation Ratios: 18 % (ref 17.9–39.5)
TIBC: 249 ug/dL — ABNORMAL LOW (ref 250–450)
UIBC: 204 ug/dL

## 2015-09-04 LAB — RETICULOCYTES
RBC.: 4.19 MIL/uL — ABNORMAL LOW (ref 4.40–5.90)
Retic Count, Absolute: 79.6 10*3/uL (ref 19.0–183.0)
Retic Ct Pct: 1.9 % (ref 0.4–3.1)

## 2015-09-04 LAB — VITAMIN B12: Vitamin B-12: 3372 pg/mL — ABNORMAL HIGH (ref 180–914)

## 2015-09-04 LAB — FOLATE: Folate: 21.9 ng/mL (ref >5.9)

## 2015-09-04 LAB — ALBUMIN: Albumin: 3.4 g/dL — ABNORMAL LOW (ref 3.5–5.0)

## 2015-09-04 LAB — FERRITIN: Ferritin: 590 ng/mL — ABNORMAL HIGH (ref 24–336)

## 2015-09-04 MED ORDER — AMLODIPINE BESYLATE 5 MG PO TABS: 5.0000 mg | ORAL_TABLET | Freq: Every day | ORAL | Status: DC

## 2015-09-04 MED ORDER — FLUOXETINE HCL 20 MG PO CAPS: 40.0000 mg | ORAL_CAPSULE | Freq: Every day | ORAL | Status: DC

## 2015-09-04 NOTE — Assessment & Plan Note (Signed)
Discussed depression will increase fluoxetine from 20 to 40 mg 2 a day

## 2015-09-04 NOTE — Assessment & Plan Note (Addendum)
Discuss lower blood pressure and being able to cut back medications will start with amlodipine decreasing from 10 mg down to 5 mg. May even be able to stop. Recheck 1-2 months blood pressure

## 2015-09-04 NOTE — Progress Notes (Signed)
BP 108/71 mmHg  Pulse 82  Temp(Src) 97.8 F (36.6 C)  Ht '5\' 6"'$  (1.676 m)  Wt 240 lb (108.863 kg)  BMI 38.76 kg/m2  SpO2 96%   Subjective:    Patient ID: Stuart Spence, male    DOB: 06/15/1960, 55 y.o.   MRN: [id number]  HPI: Stuart Spence is a 55 y.o. male  Chief Complaint  Patient presents with  . Hospitalization Follow-up  Patient newly diagnosed stage IV lung adenocarcinoma. Still has chest tube for draining pleural effusion on the right. Patient's blood pressures been low and depression is gotten worse. He is accompanied by his wife who assists with history He is not on pain medication and is essentially pain-free has appointment with radiation oncology to check for bone metastasis in his left arm  Relevant past medical, surgical, family and social history reviewed and updated as indicated. Interim medical history since our last visit reviewed. Allergies and medications reviewed and updated.  Review of Systems  Constitutional: Positive for fatigue. Negative for fever and diaphoresis.  Respiratory: Positive for shortness of breath. Negative for cough and choking.   Cardiovascular: Negative.     Per HPI unless specifically indicated above     Objective:    BP 108/71 mmHg  Pulse 82  Temp(Src) 97.8 F (36.6 C)  Ht '5\' 6"'$  (1.676 m)  Wt 240 lb (108.863 kg)  BMI 38.76 kg/m2  SpO2 96%  Wt Readings from Last 3 Encounters:  09/04/15 240 lb (108.863 kg)  08/28/15 235 lb 14.3 oz (107 kg)  08/21/15 241 lb 1.2 oz (109.35 kg)    Physical Exam  Constitutional: He is oriented to person, place, and time. He appears well-developed and well-nourished. No distress.  HENT:  Head: Normocephalic and atraumatic.  Right Ear: Hearing normal.  Left Ear: Hearing normal.  Nose: Nose normal.  Eyes: Conjunctivae and lids are normal. Right eye exhibits no discharge. Left eye exhibits no discharge. No scleral icterus.  Cardiovascular: Normal rate and regular rhythm.   Pulmonary/Chest:  Effort normal and breath sounds normal. No respiratory distress.  Musculoskeletal: Normal range of motion.  Neurological: He is alert and oriented to person, place, and time.  Skin: Skin is intact. No rash noted.  Psychiatric: He has a normal mood and affect. His speech is normal and behavior is normal. Judgment and thought content normal. Cognition and memory are normal.    Results for orders placed or performed in visit on 09/04/15  CBC with Differential/Platelet  Result Value Ref Range   WBC 3.1 (L) 3.8 - 10.6 K/uL   RBC 4.19 (L) 4.40 - 5.90 MIL/uL   Hemoglobin 12.6 (L) 13.0 - 18.0 g/dL   HCT 37.0 (L) 40.0 - 52.0 %   MCV 88.3 80.0 - 100.0 fL   MCH 30.0 26.0 - 34.0 pg   MCHC 34.0 32.0 - 36.0 g/dL   RDW 13.6 11.5 - 14.5 %   Platelets 192 150 - 440 K/uL   Neutrophils Relative % 59% %   Neutro Abs 1.8 1.4 - 6.5 K/uL   Lymphocytes Relative 22% %   Lymphs Abs 0.7 (L) 1.0 - 3.6 K/uL   Monocytes Relative 15% %   Monocytes Absolute 0.5 0.2 - 1.0 K/uL   Eosinophils Relative 4% %   Eosinophils Absolute 0.1 0 - 0.7 K/uL   Basophils Relative 0% %   Basophils Absolute 0.0 0 - 0.1 K/uL  Basic metabolic panel  Result Value Ref Range   Sodium 135 135 - 145  mmol/L   Potassium 4.2 3.5 - 5.1 mmol/L   Chloride 98 (L) 101 - 111 mmol/L   CO2 29 22 - 32 mmol/L   Glucose, Bld 130 (H) 65 - 99 mg/dL   BUN 12 6 - 20 mg/dL   Creatinine, Ser 0.98 0.61 - 1.24 mg/dL   Calcium 9.0 8.9 - 10.3 mg/dL   GFR calc non Af Amer >60 >60 mL/min   GFR calc Af Amer >60 >60 mL/min   Anion gap 8 5 - 15  Reticulocytes  Result Value Ref Range   Retic Ct Pct 1.9 0.4 - 3.1 %   RBC. 4.19 (L) 4.40 - 5.90 MIL/uL   Retic Count, Manual 79.6 19.0 - 183.0 K/uL  Albumin  Result Value Ref Range   Albumin 3.4 (L) 3.5 - 5.0 g/dL      Assessment & Plan:   Problem List Items Addressed This Visit      Cardiovascular and Mediastinum   Essential hypertension - Primary    Discuss lower blood pressure and being able to cut  back medications will start with amlodipine decreasing from 10 mg down to 5 mg. May even be able to stop. Recheck 1-2 months blood pressure      Relevant Medications   amLODipine (NORVASC) 5 MG tablet     Other   Depression    Discussed depression will increase fluoxetine from 20 to 40 mg 2 a day      Relevant Medications   FLUoxetine (PROZAC) 20 MG capsule       Follow up plan: Return in about 2 months (around 11/04/2015) for Blood pressure depression check.

## 2015-09-08 ENCOUNTER — Ambulatory Visit
Admission: RE | Admit: 2015-09-08 | Discharge: 2015-09-08 | Disposition: A | Discharge status: Home / Self Care | Payer: No Typology Code available for payment source | Source: Ambulatory Visit | Attending: Radiation Oncology | Admitting: Radiation Oncology

## 2015-09-08 ENCOUNTER — Encounter: Payer: Self-pay | Admitting: Radiation Oncology

## 2015-09-08 VITALS — BP 155/90 | HR 87 | Temp 96.8°F | Ht 66.0 in | Wt 249.3 lb

## 2015-09-08 DIAGNOSIS — R591 Generalized enlarged lymph nodes: Secondary | ICD-10-CM | POA: Diagnosis not present

## 2015-09-08 DIAGNOSIS — N529 Male erectile dysfunction, unspecified: Secondary | ICD-10-CM | POA: Diagnosis not present

## 2015-09-08 DIAGNOSIS — F329 Major depressive disorder, single episode, unspecified: Secondary | ICD-10-CM | POA: Insufficient documentation

## 2015-09-08 DIAGNOSIS — Z79899 Other long term (current) drug therapy: Secondary | ICD-10-CM | POA: Insufficient documentation

## 2015-09-08 DIAGNOSIS — C3411 Malignant neoplasm of upper lobe, right bronchus or lung: Secondary | ICD-10-CM | POA: Diagnosis not present

## 2015-09-08 DIAGNOSIS — Z510 Encounter for antineoplastic radiation therapy: Secondary | ICD-10-CM | POA: Diagnosis present

## 2015-09-08 DIAGNOSIS — M109 Gout, unspecified: Secondary | ICD-10-CM | POA: Insufficient documentation

## 2015-09-08 DIAGNOSIS — C419 Malignant neoplasm of bone and articular cartilage, unspecified: Secondary | ICD-10-CM

## 2015-09-08 DIAGNOSIS — C7951 Secondary malignant neoplasm of bone: Secondary | ICD-10-CM | POA: Insufficient documentation

## 2015-09-08 NOTE — Progress Notes (Signed)
20 minutes spent with patient and wife going over side effects, appointments , etc regarding radiation therapy.  Patient and wife appear very anxious and have many questions.  All questions asked, answered to their satisfaction.

## 2015-09-08 NOTE — Consult Note (Signed)
Except an outstanding is perfect of Radiation Oncology NEW PATIENT EVALUATION  Name: Stuart Spence  MRN: [id number]  Date:   09/08/2015     DOB: 15-Nov-1960   This 55 y.o. 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 Cancer    Initial evaluation for radiation therapy to the left humerus    DIAGNOSIS: The encounter diagnosis was Metastatic bone cancer ([place]).   PREVIOUS INVESTIGATIONS:  PET CT scans plain films reviewed Pathology report reviewed Clinical notes reviewed  HPI: Patient is a 55 year old male initially presented with increasing shortness of breath and fatigue initial CT scan demonstrated a 4.5 cm right upper lobe lesion concerning for malignancy. There was also a large right pleural effusion and noted lytic lesions in the ribs can Spada both metastatic disease. He underwent thoracentesis which was positive for adenocarcinoma TTF-1 1 and Naprosyn a were immunoreactive consistent with lung primary. He had a right Pleurx catheter and Port-A-Cath insertion in 08/13/2015. Initial PET CT scan showed 5.2 cm right upper lobe lung mass significant hypermetabolic with malignant small right pleural effusion with ipsilateral hilar subcarinal and ipsilateral mediastinal lymph nodes also hypermetabolic. There is also multiple lytic osseous metastatic lesions throughout the axial skeleton. Plain films demonstrated a lytic destructive lesion in the left femoral head which I been asked to evaluate for possible palliative radiation therapy. Interestingly he's having no bone pain at this time. He is currently on cycle 1 of Keytruda carboplatinum and Alimta and X [place]. He is tolerating chemotherapy well. He currently is on nasal oxygen.  PLANNED TREATMENT REGIMEN: Single  fraction of palliative radiation therapy to left humeral head  PAST MEDICAL HISTORY:  has a past medical history of Depression; Gout; ED (erectile dysfunction); and Cancer ([place]).    PAST SURGICAL HISTORY:  Past Surgical History  Procedure Laterality Date  . Tonsillectomy    . Portacath placement Left 08/13/2015    Procedure: INSERTION PORT-A-CATH;  Surgeon: Nestor Lewandowsky, MD;  Location: [facility] ORS;  Service: General;  Laterality: Left;  . Chest tube insertion Left 08/13/2015    Procedure: CHEST TUBE INSERTION;  Surgeon: Nestor Lewandowsky, MD;  Location: [facility] ORS;  Service: General;  Laterality: Left;  . Portacath placement      FAMILY HISTORY: family history includes Cancer (age of onset: 91) in his mother; Congestive Heart Failure (age of onset: 12) in his father; Diabetes in his sister; Heart attack (age of onset: 81) in his father; Hypertension in his father.  SOCIAL HISTORY:  reports that he has never smoked. He has never used smokeless tobacco. He reports that he does not drink alcohol or use illicit drugs.  ALLERGIES: Review of patient's allergies indicates no known allergies.  MEDICATIONS:  Current Outpatient Prescriptions  Medication Sig Dispense Refill  . albuterol (PROVENTIL HFA;VENTOLIN HFA) 108 (90 Base) MCG/ACT inhaler Inhale 1-2 puffs into the lungs See admin instructions. Inhale 1 to 2 puff by mouth every 4 to 6 hours as needed for difficulty breathing.    Marland Kitchen amLODipine (NORVASC) 5 MG tablet Take 1 tablet (5 mg total) by mouth daily. 30 tablet 6  . benazepril (LOTENSIN) 40 MG tablet Take 1 tablet (40 mg total) by mouth daily. 30 tablet 6  . colchicine 0.6 MG tablet Take 0.6 mg by mouth daily as needed (  two pills by mouth at onset, the one pill one hour later, maximum three pills per gout flare).    Marland Kitchen dexamethasone (DECADRON) 4 MG tablet 4 mg tablet twice a day the day before chemo, and the day after chemo with each regimen of carbo/alimta 36 tablet 0  . FLUoxetine (PROZAC) 20 MG  capsule Take 2 capsules (40 mg total) by mouth daily. 60 capsule 6  . folic acid (FOLVITE) 1 MG tablet Take 1 tablet (1 mg total) by mouth daily. 30 tablet 3  . HYDROcodone-acetaminophen (NORCO/VICODIN) 5-325 MG tablet Take 1 tablet by mouth every 6 (six) hours. Reported on 08/21/2015    . lidocaine-prilocaine (EMLA) cream Apply 1 application topically as needed. 30 g 2  . ondansetron (ZOFRAN) 8 MG tablet Take 1 tablet (8 mg total) by mouth every 8 (eight) hours as needed for nausea or vomiting. 20 tablet 3  . sildenafil (VIAGRA) 100 MG tablet Take 1 tablet (100 mg total) by mouth daily as needed for erectile dysfunction. 10 tablet 12   No current facility-administered medications for this encounter.    ECOG PERFORMANCE STATUS:  0 - Asymptomatic  REVIEW OF SYSTEMS: Except for the nasal dependent shortness of breath Patient denies any weight loss, fatigue, weakness, fever, chills or night sweats. Patient denies any loss of vision, blurred vision. Patient denies any ringing  of the ears or hearing loss. No irregular heartbeat. Patient denies heart murmur or history of fainting. Patient denies any chest pain or pain radiating to her upper extremities. Patient denies any shortness of breath, difficulty breathing at night, cough or hemoptysis. Patient denies any swelling in the lower legs. Patient denies any nausea vomiting, vomiting of blood, or coffee ground material in the vomitus. Patient denies any stomach pain. Patient states has had normal bowel movements no significant constipation or diarrhea. Patient denies any dysuria, hematuria or significant nocturia. Patient denies any problems walking, swelling in the joints or loss of balance. Patient denies any skin changes, loss of hair or loss of weight. Patient denies any excessive worrying or anxiety or significant depression. Patient denies any problems with insomnia. Patient denies excessive thirst, polyuria, polydipsia. Patient denies any swollen  glands, patient denies easy bruising or easy bleeding. Patient denies any recent infections, allergies or URI. Patient "s visual fields have not changed significantly in recent time.    PHYSICAL EXAM: BP 155/90 mmHg  Pulse 87  Temp(Src) 96.8 F (36 C)  Ht '5\' 6"'$  (1.676 m)  Wt 249 lb 5.4 oz (113.1 kg)  BMI 40.26 kg/m2 Well-developed slightly obese male in NAD on nasal oxygen. Range of motion of his lower extremities does not elicit pain. Motor sensory and DTR levels are equal and symmetric in upper lower extremities. Range of motion of his upper extremities does not elicit pain. Well-developed well-nourished patient in NAD. HEENT reveals PERLA, EOMI, discs not visualized.  Oral cavity is clear. No oral mucosal lesions are identified. Neck is clear without evidence of cervical or supraclavicular adenopathy. Lungs are clear to A&P. Cardiac examination is essentially unremarkable with regular rate and rhythm without murmur rub or thrill. Abdomen is benign with no organomegaly or masses noted. Motor sensory and DTR levels are equal and symmetric in the upper and lower extremities. Cranial nerves II through XII are grossly intact. Proprioception is intact. No peripheral adenopathy or edema is identified. No motor or sensory levels are noted. Crude visual fields are within normal range.  LABORATORY DATA: Cytology reports reviewed    RADIOLOGY  RESULTS: Plain films PET CT scan CT scans all reviewed   IMPRESSION: Stage IV adenocarcinoma the lung with bone metastasis in 55 year old male  PLAN: At this time I to go ahead with single fraction 800 cGy 1 to his left humeral head. This should be sufficient to prevent any further the structure left humeral head. I also may consider down the road possible palliative radiation therapy to the chest depending on his response to initial chemotherapy and will discuss that with medical oncology when it is appropriate. All this is been explained in detail to the  patient and his wife. Risks and benefits of treatment including possible fatigue skin reaction all were discussed in detail. I personally set up and ordered CT simulation later this week.  I would like to take this opportunity to thank you for allowing me to participate in the care of your patient.Armstead Peaks., MD

## 2015-09-09 ENCOUNTER — Encounter: Payer: Self-pay | Admitting: Cardiothoracic Surgery

## 2015-09-09 ENCOUNTER — Ambulatory Visit (INDEPENDENT_AMBULATORY_CARE_PROVIDER_SITE_OTHER): Payer: PRIVATE HEALTH INSURANCE | Admitting: Cardiothoracic Surgery

## 2015-09-09 ENCOUNTER — Ambulatory Visit
Admission: RE | Admit: 2015-09-09 | Discharge: 2015-09-09 | Disposition: A | Discharge status: Home / Self Care | Payer: PRIVATE HEALTH INSURANCE | Source: Ambulatory Visit | Attending: Cardiothoracic Surgery | Admitting: Cardiothoracic Surgery

## 2015-09-09 ENCOUNTER — Encounter: Payer: Self-pay | Admitting: *Deleted

## 2015-09-09 ENCOUNTER — Other Ambulatory Visit: Payer: No Typology Code available for payment source

## 2015-09-09 VITALS — BP 128/77 | HR 90 | Temp 97.7°F | Ht 66.0 in | Wt 237.4 lb

## 2015-09-09 DIAGNOSIS — J90 Pleural effusion, not elsewhere classified: Secondary | ICD-10-CM

## 2015-09-09 NOTE — Patient Instructions (Signed)
We will send you over to have your chest xray today. Angie will call you to discuss your surgery to remove your cather. Please call our office if you have any questions or concerns.

## 2015-09-09 NOTE — Patient Instructions (Signed)
  Your procedure is scheduled on: 09-18-15 Report to [place] To find out your arrival time please call [phone] between 1PM - 3PM on 09-17-15  Remember: Instructions that are not followed completely may result in serious medical risk, up to and including death, or upon the discretion of your surgeon and anesthesiologist your surgery may need to be rescheduled.    _X___ 1. Do not eat food or drink liquids after midnight. No gum chewing or hard candies.     _X___ 2. No Alcohol for 24 hours before or after surgery.   ____ 3. Bring all medications with you on the day of surgery if instructed.    ____ 4. Notify your doctor if there is any change in your medical condition     (cold, fever, infections).     Do not wear jewelry, make-up, hairpins, clips or nail polish.  Do not wear lotions, powders, or perfumes. You may wear deodorant.  Do not shave 48 hours prior to surgery. Men may shave face and neck.  Do not bring valuables to the hospital.    [facility] is not responsible for any belongings or valuables.               Contacts, dentures or bridgework may not be worn into surgery.  Leave your suitcase in the car. After surgery it may be brought to your room.  For patients admitted to the hospital, discharge time is determined by your treatment team.   Patients discharged the day of surgery will not be allowed to drive home.   Please read over the following fact sheets that you were given:     _X___ Take these medicines the morning of surgery with A SIP OF WATER:    1. AMLODIPINE  2. BENAZEPRIL  3. PROZAC  4.  5.  6.  ____ Fleet Enema (as directed)   ____ Use CHG Soap as directed  _X___ Use inhalers on the day of surgery-USE ALBUTEROL INHALER AM [place]  ____ Stop metformin 2 days prior to surgery    ____ Take 1/2 of usual insulin dose the night before surgery and none on the morning of surgery.   ____ Stop  Coumadin/Plavix/aspirin-N/A  _X___ Stop Anti-inflammatories-NO NSAIDS OR ASA PRODUCTS-HYDROCODONE OK TO CONTINUE   ____ Stop supplements until after surgery.    ____ Bring C-Pap to the hospital.

## 2015-09-09 NOTE — Progress Notes (Signed)
Stuart Spence Inpatient Post-Op Note  Patient ID: Stuart Spence, male   DOB: May 29, 1960, 55 y.o.   MRN: [id number]  HISTORY: He returns today in follow-up. He states that he and his wife have been draining his right-sided Pleurx catheter on a weekly basis but have been unable to obtain more than a small amount of drainage. He does not complain of any significant shortness of breath. He did have a chest x-ray made last week which revealed only a very small pleural effusion.   Filed Vitals:   09/09/15 0949  BP: 128/77  Pulse: 90  Temp: 97.7 F (36.5 C)     EXAM: Resp: Lungs are clear bilaterally But somewhat distant on the right..  No respiratory distress, normal effort. Heart:  Regular without murmurs Neurological: Alert and oriented to person, place, and time. Coordination normal.  Skin: Skin is warm and dry. No rash noted. No diaphoretic. No erythema. No pallor.  Psychiatric: Normal mood and affect. Normal behavior. Judgment and thought content normal.   His Port-A-Cath and Pleurx catheter insertion sites are all clean dry and intact without erythema.  ASSESSMENT: The patient would like to have his Pleurx catheter removed as it is not draining. He is scheduled to undergo radiation therapy to his left arm from his bony metastases. Is also scheduled to get chemotherapy on Friday. I discussed with him the options of removal. He understands that once the catheter is removed it cannot be replaced. We'll go ahead and get an x-ray today. He will have all his routine blood work done prior to his chemotherapy on Friday. We will plan on removing the catheter next week.   PLAN:   We will plan on removing the catheter next week. He was aware of the indications and risks. All questions were answered.    Nestor Lewandowsky, MD

## 2015-09-11 ENCOUNTER — Ambulatory Visit (INDEPENDENT_AMBULATORY_CARE_PROVIDER_SITE_OTHER): Payer: No Typology Code available for payment source | Admitting: Internal Medicine

## 2015-09-11 ENCOUNTER — Ambulatory Visit: Payer: No Typology Code available for payment source

## 2015-09-11 ENCOUNTER — Telehealth: Payer: Self-pay | Admitting: Cardiothoracic Surgery

## 2015-09-11 ENCOUNTER — Encounter: Payer: Self-pay | Admitting: Internal Medicine

## 2015-09-11 ENCOUNTER — Ambulatory Visit
Admission: RE | Admit: 2015-09-11 | Discharge: 2015-09-11 | Disposition: A | Discharge status: Home / Self Care | Payer: No Typology Code available for payment source | Source: Ambulatory Visit | Attending: Radiation Oncology | Admitting: Radiation Oncology

## 2015-09-11 VITALS — BP 148/90 | HR 107 | Ht 66.0 in | Wt 234.0 lb

## 2015-09-11 DIAGNOSIS — Z510 Encounter for antineoplastic radiation therapy: Secondary | ICD-10-CM | POA: Diagnosis not present

## 2015-09-11 DIAGNOSIS — C3490 Malignant neoplasm of unspecified part of unspecified bronchus or lung: Secondary | ICD-10-CM | POA: Diagnosis not present

## 2015-09-11 NOTE — Progress Notes (Signed)
* [facility]     Assessment and Plan:  Metastatic lung cancer. -Adenocarcinoma of the lung, with metastases to bone, currently on chemotherapy and palliative radiation.  Malignant pleural effusion. -Status post right-sided Pleurx catheter. -On review of chest x-ray, the area appears to have undergone a auto pleurodesis, the patient has a follow-up with Dr. Genevive Bi for Pleurx removal.  Debility/Deconditioning.  -Patient has been very inactive, he is encouraged to increase his activity level at a slow pace, and continue to increase with a goal of being active for approximately 30 minutes a day. -His oxygen saturation was borderline low but stable. When I ambulated him in the office today, I advised that he can ambulate without the oxygen, but should take the oxygen with him when leaving the house.   Date: 09/11/2015  MRN# [id number] Stuart Spence 11-30-1960   Stuart Spence is a 55 y.o. old male seen in follow up for chief complaint of  Chief Complaint  Patient presents with  . Hospitalization Follow-up    cough occasionally; feels good overall     HPI:   Patient is a 55 year old male who presented to the hospital in May of this year with cough and respiratory failure. He was found to have large right pleural effusion with total right lung atelectasis. He underwent thoracentesis 2, which was positive for adenocarcinoma, he had a Pleurx catheter placed by thoracic surgery, and was later discharged. He had multiple osseous metastases. He has been started on chemotherapy, who is planned to undergo radiation therapy to the left humeral head.  He is currently on 2L of oxygen, he feels that his breathing is doing fairly well. He has an albuterol inhaler but he has not had to use. He goes to [place] but he has been using motorized cart.  He wife is present and notes that he is not very active and does no house work and does not do simply chores such as walking to [employer].     He continues to have the right pleurx catheter in place with virtually no drainage and they have an appointment to see Dr. Genevive Bi in a week to take it out.   Review of chest x-rayImages from 6/6 shows reduced right-sided lung volumes.  Baseline o2 sat on RA 94% and HR 93. After walking about 600 feet sat was 89% and HR was 108; sat recovered quickly.   Medication:   Outpatient Encounter Prescriptions as of 09/11/2015  Medication Sig  . albuterol (PROVENTIL HFA;VENTOLIN HFA) 108 (90 Base) MCG/ACT inhaler Inhale 1-2 puffs into the lungs See admin instructions. Inhale 1 to 2 puff by mouth every 4 to 6 hours as needed for difficulty breathing.  Marland Kitchen amLODipine (NORVASC) 5 MG tablet Take 1 tablet (5 mg total) by mouth daily. (Patient taking differently: Take 5 mg by mouth every morning. )  . benazepril (LOTENSIN) 40 MG tablet Take 1 tablet (40 mg total) by mouth daily. (Patient taking differently: Take 40 mg by mouth every morning. )  . colchicine 0.6 MG tablet Take 0.6 mg by mouth daily as needed (two pills by mouth at onset, the one pill one hour later, maximum three pills per gout flare).  Marland Kitchen dexamethasone (DECADRON) 4 MG tablet 4 mg tablet twice a day the day before chemo, and the day after chemo with each regimen of carbo/alimta  . FLUoxetine (PROZAC) 20 MG capsule Take 2 capsules (40 mg total) by mouth daily. (Patient taking differently: Take 40 mg by mouth every  morning. )  . folic acid (FOLVITE) 1 MG tablet Take 1 tablet (1 mg total) by mouth daily.  Marland Kitchen HYDROcodone-acetaminophen (NORCO/VICODIN) 5-325 MG tablet Take 1 tablet by mouth every 6 (six) hours. Reported on 08/21/2015  . lidocaine-prilocaine (EMLA) cream Apply 1 application topically as needed.  . ondansetron (ZOFRAN) 8 MG tablet Take 1 tablet (8 mg total) by mouth every 8 (eight) hours as needed for nausea or vomiting.  . OXYGEN Inhale 2 L into the lungs continuous.  . sildenafil (VIAGRA) 100 MG tablet Take 1 tablet (100 mg total) by mouth  daily as needed for erectile dysfunction.   No facility-administered encounter medications on file as of 09/11/2015.     Allergies:  Review of patient's allergies indicates no known allergies.  Review of Systems: Gen:  Denies  fever, sweats. HEENT: Denies blurred vision. Cvc:  No dizziness, chest pain or heaviness Resp:   Denies cough or sputum porduction. Gi: Denies swallowing difficulty, stomach pain.  Gu:  Denies bladder incontinence, burning urine Ext:   No Joint pain, stiffness. Skin: No skin rash, easy bruising. Endoc:  No polyuria, polydipsia. Psych: No depression, insomnia. Other:  All other systems were reviewed and found to be negative other than what is mentioned in the HPI.   Physical Examination:   VS: BP 148/90 mmHg  Pulse 107  Ht '5\' 6"'$  (1.676 m)  Wt 234 lb (106.142 kg)  BMI 37.79 kg/m2  SpO2 93%  General Appearance: No distress  Neuro:without focal findings,   HEENT: PERRLA, EOM intact. Pulmonary: normal breath sounds, No wheezing.   CardiovascularNormal S1,S2.  No m/r/g.   Abdomen: Benign, Soft, non-tender. Renal:  No costovertebral tenderness  GU:  Not performed at this time. Endoc: No evident thyromegaly, no signs of acromegaly. Skin:   warm, no rash. Extremities: normal, no cyanosis, clubbing.   LABORATORY PANEL:   CBC  Recent Labs Lab 09/04/15 1159  WBC 3.1*  HGB 12.6*  HCT 37.0*  PLT 192   ------------------------------------------------------------------------------------------------------------------  Chemistries   Recent Labs Lab 09/04/15 1159  NA 135  K 4.2  CL 98*  CO2 29  GLUCOSE 130*  BUN 12  CREATININE 0.98  CALCIUM 9.0   ------------------------------------------------------------------------------------------------------------------  Cardiac Enzymes No results for input(s): TROPONINI in the last 168 hours. ------------------------------------------------------------  RADIOLOGY:   No results found for this or  any previous visit. Results for orders placed during the hospital encounter of 09/09/15  DG Chest 2 View   Narrative CLINICAL DATA:  History of right lung cancer. Port-A-Cath placement.  EXAM: CHEST  2 VIEW  COMPARISON:  Chest x-rays dated 08/28/2015 and 08/18/2015. Comparison also to chest CT dated 08/12/2015.  FINDINGS: Right chest wall Port-A-Cath in place with tip adequately positioned at the level of the mid SVC. No pneumothorax seen. The pleural parenchymal opacities overlying the mid and lower zones of the right lung appear stable. Stable right upper lobe mass bordering the paratracheal mediastinum. Left lung remains clear. No new lung findings.  Cardiomegaly appears stable. Overall cardiomediastinal silhouette is stable in size and configuration. Osseous structures about the chest are unremarkable.  IMPRESSION: Stable chest x-ray. Right chest wall Port-A-Cath appears appropriately positioned with tip at the level of the mid SVC. No pneumothorax.   Electronically Signed   By: Franki Cabot M.D.   On: 09/09/2015 13:19    ------------------------------------------------------------------------------------------------------------------  Thank  you for allowing [facility] to assist in the care of your patient. Our recommendations are noted above.  Please contact us if we can be of further service.   Marda Stalker, MD.  [facility] Office Number: [phone]  Patricia Pesa, M.D.  Vilinda Boehringer, M.D.  Merton Border, M.D  09/11/2015

## 2015-09-11 NOTE — Patient Instructions (Signed)
-  Begin increasing her activity level, try to increase to 30 minutes of slow, paced activity daily. Take it slow and build up his stamina as tolerated.  -You may walk around without oxygen, however, a few are going somewhere take the oxygen with you.

## 2015-09-11 NOTE — Telephone Encounter (Signed)
Pt advised of pre op date/time and sx date. Sx: 09/18/15 with Dr Geanie Kenning cath removal Pre op: 09/09/15 between 1-5:00pm--Phone.   Patient made aware to call [phone], between 1-3:00pm the day before surgery, to find out what time to arrive.

## 2015-09-12 ENCOUNTER — Inpatient Hospital Stay: Payer: PRIVATE HEALTH INSURANCE

## 2015-09-12 ENCOUNTER — Other Ambulatory Visit: Payer: Self-pay | Admitting: Hematology and Oncology

## 2015-09-12 ENCOUNTER — Telehealth: Payer: Self-pay | Admitting: Family Medicine

## 2015-09-12 ENCOUNTER — Ambulatory Visit: Payer: No Typology Code available for payment source | Admitting: Hematology and Oncology

## 2015-09-12 ENCOUNTER — Encounter: Payer: Self-pay | Admitting: Hematology and Oncology

## 2015-09-12 ENCOUNTER — Other Ambulatory Visit: Payer: No Typology Code available for payment source

## 2015-09-12 ENCOUNTER — Inpatient Hospital Stay (HOSPITAL_BASED_OUTPATIENT_CLINIC_OR_DEPARTMENT_OTHER): Payer: PRIVATE HEALTH INSURANCE | Admitting: Hematology and Oncology

## 2015-09-12 ENCOUNTER — Other Ambulatory Visit: Payer: Self-pay

## 2015-09-12 VITALS — BP 144/90 | HR 102 | Temp 95.5°F | Resp 18 | Ht 66.0 in | Wt 234.9 lb

## 2015-09-12 DIAGNOSIS — Z79899 Other long term (current) drug therapy: Secondary | ICD-10-CM

## 2015-09-12 DIAGNOSIS — C3491 Malignant neoplasm of unspecified part of right bronchus or lung: Secondary | ICD-10-CM

## 2015-09-12 DIAGNOSIS — J910 Malignant pleural effusion: Secondary | ICD-10-CM

## 2015-09-12 DIAGNOSIS — C7951 Secondary malignant neoplasm of bone: Secondary | ICD-10-CM

## 2015-09-12 DIAGNOSIS — C778 Secondary and unspecified malignant neoplasm of lymph nodes of multiple regions: Secondary | ICD-10-CM

## 2015-09-12 DIAGNOSIS — R600 Localized edema: Secondary | ICD-10-CM

## 2015-09-12 DIAGNOSIS — Z510 Encounter for antineoplastic radiation therapy: Secondary | ICD-10-CM | POA: Diagnosis not present

## 2015-09-12 DIAGNOSIS — Z801 Family history of malignant neoplasm of trachea, bronchus and lung: Secondary | ICD-10-CM

## 2015-09-12 DIAGNOSIS — C3411 Malignant neoplasm of upper lobe, right bronchus or lung: Secondary | ICD-10-CM | POA: Diagnosis not present

## 2015-09-12 DIAGNOSIS — Z9981 Dependence on supplemental oxygen: Secondary | ICD-10-CM

## 2015-09-12 DIAGNOSIS — J90 Pleural effusion, not elsewhere classified: Secondary | ICD-10-CM

## 2015-09-12 DIAGNOSIS — R739 Hyperglycemia, unspecified: Secondary | ICD-10-CM

## 2015-09-12 DIAGNOSIS — Z5111 Encounter for antineoplastic chemotherapy: Secondary | ICD-10-CM | POA: Diagnosis not present

## 2015-09-12 LAB — COMPREHENSIVE METABOLIC PANEL
ALT: 22 U/L (ref 17–63)
AST: 20 U/L (ref 15–41)
Albumin: 3.6 g/dL (ref 3.5–5.0)
Alkaline Phosphatase: 168 U/L — ABNORMAL HIGH (ref 38–126)
Anion gap: 8 (ref 5–15)
BUN: 17 mg/dL (ref 6–20)
CO2: 25 mmol/L (ref 22–32)
Calcium: 8.7 mg/dL — ABNORMAL LOW (ref 8.9–10.3)
Chloride: 100 mmol/L — ABNORMAL LOW (ref 101–111)
Creatinine, Ser: 1.09 mg/dL (ref 0.61–1.24)
GFR calc Af Amer: >60 mL/min (ref >60)
GFR calc non Af Amer: >60 mL/min (ref >60)
Glucose, Bld: 244 mg/dL — ABNORMAL HIGH (ref 65–99)
Potassium: 3.8 mmol/L (ref 3.5–5.1)
Sodium: 133 mmol/L — ABNORMAL LOW (ref 135–145)
Total Bilirubin: 0.6 mg/dL (ref 0.3–1.2)
Total Protein: 7.8 g/dL (ref 6.5–8.1)

## 2015-09-12 LAB — CBC WITH DIFFERENTIAL/PLATELET
Basophils Absolute: 0 10*3/uL (ref 0–0.1)
Basophils Relative: 0 %
Eosinophils Absolute: 0 10*3/uL (ref 0–0.7)
Eosinophils Relative: 0 %
HCT: 38.1 % — ABNORMAL LOW (ref 40.0–52.0)
Hemoglobin: 13 g/dL (ref 13.0–18.0)
Lymphocytes Relative: 6 %
Lymphs Abs: 0.7 10*3/uL — ABNORMAL LOW (ref 1.0–3.6)
MCH: 30.4 pg (ref 26.0–34.0)
MCHC: 34.2 g/dL (ref 32.0–36.0)
MCV: 89 fL (ref 80.0–100.0)
Monocytes Absolute: 0.7 10*3/uL (ref 0.2–1.0)
Monocytes Relative: 6 %
Neutro Abs: 10.8 10*3/uL — ABNORMAL HIGH (ref 1.4–6.5)
Neutrophils Relative %: 88 %
Platelets: 384 10*3/uL (ref 150–440)
RBC: 4.28 MIL/uL — ABNORMAL LOW (ref 4.40–5.90)
RDW: 13.8 % (ref 11.5–14.5)
WBC: 12.3 10*3/uL — ABNORMAL HIGH (ref 3.8–10.6)

## 2015-09-12 LAB — LACTATE DEHYDROGENASE: LDH: 245 U/L — ABNORMAL HIGH (ref 98–192)

## 2015-09-12 LAB — MAGNESIUM: Magnesium: 2.2 mg/dL (ref 1.7–2.4)

## 2015-09-12 MED ORDER — SODIUM CHLORIDE 0.9 % IV SOLN
200.0000 mg | Freq: Once | INTRAVENOUS | Status: AC
  Administered 2015-09-12: 200 mg via INTRAVENOUS
  Filled 2015-09-12: qty 8

## 2015-09-12 MED ORDER — SODIUM CHLORIDE 0.9 % IV SOLN
10.0000 mg | Freq: Once | INTRAVENOUS | Status: AC
  Administered 2015-09-12: 10 mg via INTRAVENOUS
  Filled 2015-09-12: qty 1

## 2015-09-12 MED ORDER — SODIUM CHLORIDE 0.9 % IV SOLN
Freq: Once | INTRAVENOUS | Status: AC
  Administered 2015-09-12: 11:00:00 via INTRAVENOUS
  Filled 2015-09-12: qty 1000

## 2015-09-12 MED ORDER — PALONOSETRON HCL INJECTION 0.25 MG/5ML
0.2500 mg | Freq: Once | INTRAVENOUS | Status: AC
  Administered 2015-09-12: 0.25 mg via INTRAVENOUS
  Filled 2015-09-12: qty 5

## 2015-09-12 MED ORDER — SODIUM CHLORIDE 0.9 % IV SOLN
722.0000 mg | Freq: Once | INTRAVENOUS | Status: AC
  Administered 2015-09-12: 720 mg via INTRAVENOUS
  Filled 2015-09-12: qty 72

## 2015-09-12 MED ORDER — PEMETREXED DISODIUM CHEMO INJECTION 500 MG
1100.0000 mg | Freq: Once | INTRAVENOUS | Status: AC
  Administered 2015-09-12: 1100 mg via INTRAVENOUS
  Filled 2015-09-12: qty 40

## 2015-09-12 MED ORDER — SODIUM CHLORIDE 0.9% FLUSH
10.0000 mL | INTRAVENOUS | Status: DC | PRN
  Administered 2015-09-12: 10 mL
  Filled 2015-09-12: qty 10

## 2015-09-12 MED ORDER — HEPARIN SOD (PORK) LOCK FLUSH 100 UNIT/ML IV SOLN
500.0000 [IU] | Freq: Once | INTRAVENOUS | Status: AC | PRN
  Administered 2015-09-12: 500 [IU]
  Filled 2015-09-12: qty 5

## 2015-09-12 NOTE — Pre-Procedure Instructions (Signed)
CALLED FOR DR Wonda Cheng

## 2015-09-12 NOTE — Progress Notes (Signed)
[facility] day:  09/12/2015  Chief Complaint: Stuart Spence is a 55 y.o. male with metastatic lung cancer who is seen for assessment on prior to cycle #2 Keytruda plus carboplatin and Alimta.  HPI:  The patient was last seen in the medical oncology clinic on 08/28/2015.  At that time, he wsas day 8 s/p cycle #1 chemotherapy.  He felt good. He had little output from his PleurX catheter. Exam revealed bilateral 1+ lower extremity edema. Calcium was 7.9 (corrected 8.54). He had a mild normocytic anemia.  CXR revealed stable pleural parenchymal opacities over the right lower lobe.  Anemia work-up on 09/04/2015 revealed a ferritin (519), iron saturation (18%), B12 (3372), and folate (21.9).  He was seen by radiation oncology on 09/08/2015 regarding the lesion of his left humeral head.  Plan was for a single fraction of 800 cGy to the left humeral head.  He saw Dr. Ashby Dawes yesterday.  He was able to ambulate without oxygen.  He now uses oxygen as needed.  CXR on 09/09/2015 revealed improvement in aeration on the right.  He states that his PleurX catheter will be removed on 09/16/2015 by Dr. Genevive Bi.    Past Medical History  Diagnosis Date  . Depression   . Gout   . ED (erectile dysfunction)   . Cancer ([place])     Lung  . On home oxygen therapy   . Personal history of chemotherapy     PT TAKING CHEMO EVERY 3 WEEKS    Past Surgical History  Procedure Laterality Date  . Tonsillectomy    . Portacath placement Left 08/13/2015    Procedure: INSERTION PORT-A-CATH;  Surgeon: Nestor Lewandowsky, MD;  Location: [facility] ORS;  Service: General;  Laterality: Left;  . Chest tube insertion Left 08/13/2015    Procedure: CHEST TUBE INSERTION;  Surgeon: Nestor Lewandowsky, MD;  Location: [facility] ORS;  Service: General;  Laterality: Left;  . Portacath placement      Family History  Problem Relation Age of Onset  . Cancer Mother 6    lung  . Heart attack Father 9  .  Hypertension Father   . Congestive Heart Failure Father 43    died from  . Diabetes Sister     Social History:  reports that he has never smoked. He has never used smokeless tobacco. He reports that he does not drink alcohol or use illicit drugs.  He has worked 67 years in the [employer].He notes exposure to chemicals.   He recently switched jobs.  The patient is accompanied by his wife today.  Allergies: No Known Allergies  Current Medications: Current Outpatient Prescriptions  Medication Sig Dispense Refill  . amLODipine (NORVASC) 5 MG tablet Take 1 tablet (5 mg total) by mouth daily. (Patient taking differently: Take 5 mg by mouth every morning. ) 30 tablet 6  . benazepril (LOTENSIN) 40 MG tablet Take 1 tablet (40 mg total) by mouth daily. (Patient taking differently: Take 40 mg by mouth every morning. ) 30 tablet 6  . colchicine 0.6 MG tablet Take 0.6 mg by mouth daily as needed (two pills by mouth at onset, the one pill one hour later, maximum three pills per gout flare).    Marland Kitchen dexamethasone (DECADRON) 4 MG tablet 4 mg tablet twice a day the day before chemo, and the day after chemo with each regimen of carbo/alimta 36 tablet 0  . FLUoxetine (PROZAC) 20 MG capsule Take 2 capsules (40 mg total) by  mouth daily. (Patient taking differently: Take 40 mg by mouth every morning. ) 60 capsule 6  . folic acid (FOLVITE) 1 MG tablet Take 1 tablet (1 mg total) by mouth daily. 30 tablet 3  . HYDROcodone-acetaminophen (NORCO/VICODIN) 5-325 MG tablet Take 1 tablet by mouth every 6 (six) hours. Reported on 08/21/2015    . lidocaine-prilocaine (EMLA) cream Apply 1 application topically as needed. 30 g 2  . ondansetron (ZOFRAN) 8 MG tablet Take 1 tablet (8 mg total) by mouth every 8 (eight) hours as needed for nausea or vomiting. 20 tablet 3  . OXYGEN Inhale 2 L into the lungs continuous. Reported on 09/12/2015    . sildenafil (VIAGRA) 100 MG tablet Take 1 tablet (100 mg total) by mouth daily as needed  for erectile dysfunction. 10 tablet 12  . albuterol (PROVENTIL HFA;VENTOLIN HFA) 108 (90 Base) MCG/ACT inhaler Inhale 1-2 puffs into the lungs See admin instructions. Reported on 09/12/2015     No current facility-administered medications for this visit.    Review of Systems:  GENERAL: Feels good. No fevers. Weight down 1 pound. Baseline weight 235-240 pounds. PERFORMANCE STATUS (ECOG): 2 HEENT: No visual changes, runny nose, sore throat, mouth sores or tenderness. Lungs: No shortness of breath. No cough. No hemoptysis.   Cardiac: No chest pain, palpitations, orthopnea, or PND. GI: No nausea, vomiting, diarrhea, constipation, melena or hematochezia. No prior colonoscopy. GU: No urgency, frequency, dysuria, or hematuria. Musculoskeletal: No back pain. No joint pain. No muscle tenderness. Extremities: No pain or swelling. Skin: No rashes or skin changes. Neuro: No headache, numbness or weakness, balance or coordination issues. Endocrine: Blood sugar elevated after large Dr Malachi Bonds and donut.  No diabetes, thyroid issues, hot flashes or night sweats. Psych: No mood changes, depression or anxiety. Pain: No focal pain. Review of systems: All other systems reviewed and found to be negative.  Physical Exam: Blood pressure 144/90, pulse 102, temperature 95.5 F (35.3 C), temperature source Tympanic, resp. rate 18, height '5\' 6"'$  (1.676 m), weight 234 lb 14.4 oz (106.55 kg), SpO2 95 %. GENERAL: Well developed, well nourished, heavyset gentleman sitting in the exam room in no acute distress. MENTAL STATUS: Alert and oriented to person, place and time. HEAD: Very short dark hair. Gray facial hair. Mild facial erythema (post Decadron).  Normocephalic, atraumatic, face symmetric, no Cushingoid features. EYES: Oval glasses. Pupils equal round and reactive to light and accomodation. No conjunctivitis or scleral icterus. ENT: Oropharynx clear without lesion. Tongue normal.  Mucous membranes moist.  RESPIRATORY: Decreased breath sounds right base. No wheezes or rhonchi. Right sided Pleurx catheter. CARDIOVASCULAR: Regular rate and rhythm without murmur, rub or gallop. CHEST:  Right port-a-cath. ABDOMEN: Soft, non-tender, with active bowel sounds, and no hepatosplenomegaly. No masses. SKIN: No rashes, ulcers or lesions. EXTREMITIES: 1+ lower extremity edema.  No skin discoloration or tenderness. No palpable cords. LYMPH NODES: No palpable cervical, supraclavicular, axillary or inguinal adenopathy  NEUROLOGICAL: Unremarkable. PSYCH: Appropriate.   Appointment on 09/12/2015  Component Date Value Ref Range Status  . WBC 09/12/2015 12.3* 3.8 - 10.6 K/uL Final  . RBC 09/12/2015 4.28* 4.40 - 5.90 MIL/uL Final  . Hemoglobin 09/12/2015 13.0  13.0 - 18.0 g/dL Final  . HCT 09/12/2015 38.1* 40.0 - 52.0 % Final  . MCV 09/12/2015 89.0  80.0 - 100.0 fL Final  . MCH 09/12/2015 30.4  26.0 - 34.0 pg Final  . MCHC 09/12/2015 34.2  32.0 - 36.0 g/dL Final  . RDW 09/12/2015 13.8  11.5 -  14.5 % Final  . Platelets 09/12/2015 384  150 - 440 K/uL Final  . Neutrophils Relative % 09/12/2015 88   Final  . Neutro Abs 09/12/2015 10.8* 1.4 - 6.5 K/uL Final  . Lymphocytes Relative 09/12/2015 6   Final  . Lymphs Abs 09/12/2015 0.7* 1.0 - 3.6 K/uL Final  . Monocytes Relative 09/12/2015 6   Final  . Monocytes Absolute 09/12/2015 0.7  0.2 - 1.0 K/uL Final  . Eosinophils Relative 09/12/2015 0   Final  . Eosinophils Absolute 09/12/2015 0.0  0 - 0.7 K/uL Final  . Basophils Relative 09/12/2015 0   Final  . Basophils Absolute 09/12/2015 0.0  0 - 0.1 K/uL Final  . Sodium 09/12/2015 133* 135 - 145 mmol/L Final  . Potassium 09/12/2015 3.8  3.5 - 5.1 mmol/L Final  . Chloride 09/12/2015 100* 101 - 111 mmol/L Final  . CO2 09/12/2015 25  22 - 32 mmol/L Final  . Glucose, Bld 09/12/2015 244* 65 - 99 mg/dL Final  . BUN 09/12/2015 17  6 - 20 mg/dL Final  . Creatinine, Ser 09/12/2015 1.09   0.61 - 1.24 mg/dL Final  . Calcium 09/12/2015 8.7* 8.9 - 10.3 mg/dL Final  . Total Protein 09/12/2015 7.8  6.5 - 8.1 g/dL Final  . Albumin 09/12/2015 3.6  3.5 - 5.0 g/dL Final  . AST 09/12/2015 20  15 - 41 U/L Final  . ALT 09/12/2015 22  17 - 63 U/L Final  . Alkaline Phosphatase 09/12/2015 168* 38 - 126 U/L Final  . Total Bilirubin 09/12/2015 0.6  0.3 - 1.2 mg/dL Final  . GFR calc non Af Amer 09/12/2015 >60  >60 mL/min Final  . GFR calc Af Amer 09/12/2015 >60  >60 mL/min Final   Comment: (NOTE) The eGFR has been calculated using the CKD EPI equation. This calculation has not been validated in all clinical situations. eGFR's persistently <60 mL/min signify possible Chronic Kidney Disease.   . Anion gap 09/12/2015 8  5 - 15 Final  . Magnesium 09/12/2015 2.2  1.7 - 2.4 mg/dL Final  . LDH 09/12/2015 245* 98 - 192 U/L Final    Assessment:  Stuart Spence is a 55 y.o. male with stage IV adenocarcinoma of the lung.  He has no smoking history.  He presented with with a large right sided pleural effusion and an ill-defined 4.5 x 4.5 cm mass in the right upper lobe. Thoracentesis x 2 of approximately 4.7 liters of blood fluid revealed adenocarcinoma. TTF-1 and Napsin A were immunoreactive and consistent with a lung primary. PleurX catheter was placed on 08/13/2015.  Chest CT angiogram on 08/10/2015 revealed and ill-defined 4.5 x 4.5 cm area of hypodensity in the right upper lobe with small areas of air bronchogram. This was incompletely characterized but is concerning for a centrally obstructing mass. There was complete occlusion of the right upper, right middle, and right lower lobe bronchi with complete collapse of the right lung. There was a large right pleural effusion. There was a lytic lesion involving the right fourth rib compatible with metastatic disease. There was no CT evidence of pulmonary embolism.  PET scan on 08/20/2015 revealed a hypermetabolic 5.2 cm medial right upper lobe lung  mass, consistent with primary bronchogenic carcinoma, with a broad attachment to the medial right upper lobe pleura.  There was interlobular septal thickening throughout the right lung suggested a component of lymphangitic tumor.  There was a malignant small right pleural effusion with hypermetabolism throughout the right pleural space.  There  was hypermetabolic ipsilateral hilar, subcarinal and ipsilateral mediastinal nodal metastases.  There was extensive hypermetabolic lytic osseous metastases throughout the axial and proximal appendicular skeleton.    Regarding the skeleton, there were numerous hypermetabolic faintly lytic osseous metastases in the left humeral head (SUV 6.8), right acromion, bilateral ribs most prominent in the anterior right fourth rib  (SUV 16.0), thoracolumbar spine (T11 vertebral body with SUV 9.4), bilateral iliac bones (medial right iliac bone with SUV 12.1 and left anterior acetabulum with SUV 10.0), and right proximal femur (SUV 10.0 in the region of the right lesser trochanter).  Plain films on   Plain films on 08/21/2015 revealed a left humerus lytic lesion with some thinning and some absence of the cortical margin.  Anemia work-up on 09/04/2015 revealed a ferritin (519), iron saturation (18%), B12 (3372), and folate (21.9).  He is s/p cycle #1 cycle #1 Keytruda, carboplatin, Alimta (08/22/2015).  He began monthly Xgeva on 08/22/2015.  He tolerated his chemotherapy well.  Symptomatically, he feels good.  He is using oxygen prn.  CXR has improved. Calcium is  8.7 on  2 pills a day.    Plan: 1.  Labs today:  CBC with diff, CMP, Mg, CEA, LDH. 2.  Cycle #3 carboplatin, Alimta, and Keytruda. 3.  Review CXR and plan for PleurX catheter removal on 09/16/2015. 4.  Follow-up with Dr. Baruch Gouty of radiation oncology for single fraction of XRT to left humeral head. 5.  Follow-up additional testing (EGFR, ALK, ROS1, PDL-1). 6.  Increase calcium to 2 pills twice a day. 7.   Patient to follow-up with PCP regarding hyperglycemia secondary to Decadron (possible sliding scale insulin). 8.  RTC in 1 week for labs (BMP) and Xgeva. 9.  RTC in 3 weeks for MD assessment, labs (CBC with diff, CMP, Mg, CEA), and cycle #3 carboplatin, Alimta, and Keytruda  Addendum:  Confirm patient's home use of calcium.   Lequita Asal, MD  09/12/2015, 10:30 AM

## 2015-09-12 NOTE — Telephone Encounter (Signed)
Sheri from the cancer center would like to talk to [place] about patient since Dr. Jeananne Rama isn't here. Her call back number is [phone]. Thanks.

## 2015-09-12 NOTE — Progress Notes (Signed)
Overall reports feeling good and strong.  Dr. Benay Spice per pt can use O2 as needed.  Pleaurex when its drained barely covers bottom of container.  Dr. Genevive Bi is to remove next Thursday per pt.

## 2015-09-12 NOTE — Telephone Encounter (Signed)
Patient needs appointment to get a plan for keeping sugar under control during chemo.

## 2015-09-13 LAB — CEA: CEA: 837.1 ng/mL — ABNORMAL HIGH (ref 0.0–4.7)

## 2015-09-15 ENCOUNTER — Other Ambulatory Visit: Payer: Self-pay

## 2015-09-15 ENCOUNTER — Telehealth: Payer: Self-pay

## 2015-09-15 ENCOUNTER — Encounter
Admission: RE | Admit: 2015-09-15 | Discharge: 2015-09-15 | Disposition: A | Discharge status: Home / Self Care | Payer: PRIVATE HEALTH INSURANCE | Source: Ambulatory Visit | Attending: Cardiothoracic Surgery | Admitting: Cardiothoracic Surgery

## 2015-09-15 DIAGNOSIS — L928 Other granulomatous disorders of the skin and subcutaneous tissue: Secondary | ICD-10-CM | POA: Diagnosis not present

## 2015-09-15 DIAGNOSIS — M109 Gout, unspecified: Secondary | ICD-10-CM | POA: Diagnosis not present

## 2015-09-15 DIAGNOSIS — N529 Male erectile dysfunction, unspecified: Secondary | ICD-10-CM | POA: Diagnosis not present

## 2015-09-15 DIAGNOSIS — J910 Malignant pleural effusion: Secondary | ICD-10-CM | POA: Diagnosis present

## 2015-09-15 DIAGNOSIS — I10 Essential (primary) hypertension: Secondary | ICD-10-CM | POA: Diagnosis not present

## 2015-09-15 DIAGNOSIS — L0889 Other specified local infections of the skin and subcutaneous tissue: Secondary | ICD-10-CM | POA: Diagnosis not present

## 2015-09-15 DIAGNOSIS — F329 Major depressive disorder, single episode, unspecified: Secondary | ICD-10-CM | POA: Diagnosis not present

## 2015-09-15 LAB — PROTIME-INR
INR: 1.18
PROTHROMBIN TIME: 15.2 s — AB (ref 11.4–15.0)

## 2015-09-15 LAB — APTT: APTT: 25 s (ref 24–36)

## 2015-09-15 NOTE — Telephone Encounter (Signed)
Called pt per MD to confirm pt is taking 2 pills 600 mg Calcium BID.  For a total of 1200 mg in the morning and 1200 mg in the evening.  No other concerns noted.

## 2015-09-16 ENCOUNTER — Encounter: Payer: Self-pay | Admitting: Pathology

## 2015-09-17 ENCOUNTER — Ambulatory Visit
Admission: RE | Admit: 2015-09-17 | Discharge: 2015-09-17 | Disposition: A | Discharge status: Home / Self Care | Payer: No Typology Code available for payment source | Source: Ambulatory Visit | Attending: Radiation Oncology | Admitting: Radiation Oncology

## 2015-09-17 DIAGNOSIS — Z510 Encounter for antineoplastic radiation therapy: Secondary | ICD-10-CM | POA: Diagnosis not present

## 2015-09-18 ENCOUNTER — Encounter
Admission: RE | Disposition: A | Discharge status: Home / Self Care | Payer: Self-pay | Source: Ambulatory Visit | Attending: Cardiothoracic Surgery

## 2015-09-18 ENCOUNTER — Encounter: Payer: Self-pay | Admitting: *Deleted

## 2015-09-18 ENCOUNTER — Ambulatory Visit: Payer: PRIVATE HEALTH INSURANCE | Admitting: Anesthesiology

## 2015-09-18 ENCOUNTER — Ambulatory Visit
Admission: RE | Admit: 2015-09-18 | Discharge: 2015-09-18 | Disposition: A | Discharge status: Home / Self Care | Payer: PRIVATE HEALTH INSURANCE | Source: Ambulatory Visit | Attending: Cardiothoracic Surgery | Admitting: Cardiothoracic Surgery

## 2015-09-18 DIAGNOSIS — L928 Other granulomatous disorders of the skin and subcutaneous tissue: Secondary | ICD-10-CM | POA: Diagnosis not present

## 2015-09-18 DIAGNOSIS — Z510 Encounter for antineoplastic radiation therapy: Secondary | ICD-10-CM | POA: Diagnosis not present

## 2015-09-18 DIAGNOSIS — I10 Essential (primary) hypertension: Secondary | ICD-10-CM | POA: Insufficient documentation

## 2015-09-18 DIAGNOSIS — J90 Pleural effusion, not elsewhere classified: Secondary | ICD-10-CM

## 2015-09-18 DIAGNOSIS — L0889 Other specified local infections of the skin and subcutaneous tissue: Secondary | ICD-10-CM | POA: Insufficient documentation

## 2015-09-18 DIAGNOSIS — N529 Male erectile dysfunction, unspecified: Secondary | ICD-10-CM | POA: Insufficient documentation

## 2015-09-18 DIAGNOSIS — F329 Major depressive disorder, single episode, unspecified: Secondary | ICD-10-CM | POA: Insufficient documentation

## 2015-09-18 DIAGNOSIS — M109 Gout, unspecified: Secondary | ICD-10-CM | POA: Insufficient documentation

## 2015-09-18 HISTORY — DX: Personal history of antineoplastic chemotherapy: Z92.21

## 2015-09-18 HISTORY — PX: CHEST TUBE INSERTION: SHX231

## 2015-09-18 HISTORY — DX: Dependence on supplemental oxygen: Z99.81

## 2015-09-18 LAB — COMPREHENSIVE METABOLIC PANEL
ALBUMIN: 3.8 g/dL (ref 3.5–5.0)
ALT: 21 U/L (ref 17–63)
AST: 22 U/L (ref 15–41)
Alkaline Phosphatase: 152 U/L — ABNORMAL HIGH (ref 38–126)
Anion gap: 8 (ref 5–15)
BILIRUBIN TOTAL: 0.7 mg/dL (ref 0.3–1.2)
BUN: 19 mg/dL (ref 6–20)
CHLORIDE: 100 mmol/L — AB (ref 101–111)
CO2: 26 mmol/L (ref 22–32)
CREATININE: 0.93 mg/dL (ref 0.61–1.24)
Calcium: 8.4 mg/dL — ABNORMAL LOW (ref 8.9–10.3)
GFR calc Af Amer: >60 mL/min (ref >60)
GFR calc non Af Amer: >60 mL/min (ref >60)
GLUCOSE: 97 mg/dL (ref 65–99)
POTASSIUM: 4.1 mmol/L (ref 3.5–5.1)
Sodium: 134 mmol/L — ABNORMAL LOW (ref 135–145)
Total Protein: 7 g/dL (ref 6.5–8.1)

## 2015-09-18 LAB — CBC WITH DIFFERENTIAL/PLATELET
BASOS ABS: 0 10*3/uL (ref 0–0.1)
BASOS PCT: 1 %
EOS ABS: 0.1 10*3/uL (ref 0–0.7)
EOS PCT: 2 %
HCT: 38.2 % — ABNORMAL LOW (ref 40.0–52.0)
Hemoglobin: 12.9 g/dL — ABNORMAL LOW (ref 13.0–18.0)
Lymphocytes Relative: 16 %
Lymphs Abs: 0.8 10*3/uL — ABNORMAL LOW (ref 1.0–3.6)
MCH: 29.8 pg (ref 26.0–34.0)
MCHC: 33.8 g/dL (ref 32.0–36.0)
MCV: 88.3 fL (ref 80.0–100.0)
MONO ABS: 0.4 10*3/uL (ref 0.2–1.0)
Monocytes Relative: 7 %
NEUTROS ABS: 4 10*3/uL (ref 1.4–6.5)
Neutrophils Relative %: 74 %
PLATELETS: 171 10*3/uL (ref 150–440)
RBC: 4.33 MIL/uL — ABNORMAL LOW (ref 4.40–5.90)
RDW: 14.5 % (ref 11.5–14.5)
WBC: 5.4 10*3/uL (ref 3.8–10.6)

## 2015-09-18 LAB — PROTIME-INR
INR: 1.18
Prothrombin Time: 15.2 seconds — ABNORMAL HIGH (ref 11.4–15.0)

## 2015-09-18 SURGERY — CHEST TUBE INSERTION
Anesthesia: Monitor Anesthesia Care | Site: Chest | Wound class: Clean

## 2015-09-18 MED ORDER — ONDANSETRON HCL 4 MG/2ML IJ SOLN: 4.0000 mg | Freq: Once | INTRAMUSCULAR | Status: DC | PRN

## 2015-09-18 MED ORDER — BUPIVACAINE-EPINEPHRINE (PF) 0.5% -1:200000 IJ SOLN
INTRAMUSCULAR | Status: AC
  Filled 2015-09-18: qty 30

## 2015-09-18 MED ORDER — FAMOTIDINE 20 MG PO TABS
ORAL_TABLET | ORAL | Status: AC
  Administered 2015-09-18: 20 mg via ORAL
  Filled 2015-09-18: qty 1

## 2015-09-18 MED ORDER — BUPIVACAINE-EPINEPHRINE (PF) 0.5% -1:200000 IJ SOLN
INTRAMUSCULAR | Status: DC | PRN
  Administered 2015-09-18: 5 mL via PERINEURAL

## 2015-09-18 MED ORDER — PROPOFOL 10 MG/ML IV BOLUS
INTRAVENOUS | Status: DC | PRN
  Administered 2015-09-18: 20 mg via INTRAVENOUS

## 2015-09-18 MED ORDER — CHLORHEXIDINE GLUCONATE CLOTH 2 % EX PADS: 6.0000 | MEDICATED_PAD | Freq: Once | CUTANEOUS | Status: DC

## 2015-09-18 MED ORDER — FENTANYL CITRATE (PF) 100 MCG/2ML IJ SOLN
INTRAMUSCULAR | Status: DC | PRN
Start: 2015-09-18 — End: 2015-09-18
  Administered 2015-09-18: 25 ug via INTRAVENOUS
  Administered 2015-09-18: 50 ug via INTRAVENOUS
  Administered 2015-09-18: 25 ug via INTRAVENOUS
  Administered 2015-09-18: 100 ug via INTRAVENOUS

## 2015-09-18 MED ORDER — MIDAZOLAM HCL 2 MG/2ML IJ SOLN
INTRAMUSCULAR | Status: DC | PRN
  Administered 2015-09-18: 1 mg via INTRAVENOUS
  Administered 2015-09-18: 0.5 mg via INTRAVENOUS
  Administered 2015-09-18: 2 mg via INTRAVENOUS
  Administered 2015-09-18: 0.5 mg via INTRAVENOUS

## 2015-09-18 MED ORDER — FAMOTIDINE 20 MG PO TABS
20.0000 mg | ORAL_TABLET | Freq: Once | ORAL | Status: AC
  Administered 2015-09-18: 20 mg via ORAL

## 2015-09-18 MED ORDER — FENTANYL CITRATE (PF) 100 MCG/2ML IJ SOLN: 25.0000 ug | INTRAMUSCULAR | Status: DC | PRN

## 2015-09-18 MED ORDER — LACTATED RINGERS IV SOLN
INTRAVENOUS | Status: DC
  Administered 2015-09-18: 12:00:00 via INTRAVENOUS

## 2015-09-18 MED ORDER — DEXTROSE 5 % IV SOLN
1.5000 g | Freq: Once | INTRAVENOUS | Status: AC
  Administered 2015-09-18: 1.5 g via INTRAVENOUS
  Filled 2015-09-18: qty 1.5

## 2015-09-18 MED ORDER — ONDANSETRON HCL 4 MG/2ML IJ SOLN
INTRAMUSCULAR | Status: DC | PRN
  Administered 2015-09-18: 4 mg via INTRAVENOUS

## 2015-09-18 SURGICAL SUPPLY — 35 items
BLADE SURG SZ11 CARB STEEL (BLADE) ×2 IMPLANT
CANISTER SUCT 1200ML W/VALVE (MISCELLANEOUS) ×2 IMPLANT
CHLORAPREP W/TINT 26ML (MISCELLANEOUS) ×2 IMPLANT
DRAIN CHEST DRY SUCT SGL (MISCELLANEOUS) ×2 IMPLANT
DRAPE LAPAROTOMY 77X122 PED (DRAPES) ×2 IMPLANT
ELECT REM PT RETURN 9FT ADLT (ELECTROSURGICAL) ×2
ELECTRODE REM PT RTRN 9FT ADLT (ELECTROSURGICAL) ×1 IMPLANT
GAUZE SPONGE 4X4 12PLY STRL (GAUZE/BANDAGES/DRESSINGS) ×2 IMPLANT
GLOVE SURG SYN 7.5  E (GLOVE) ×1
GLOVE SURG SYN 7.5 E (GLOVE) ×1 IMPLANT
GOWN STRL REUS W/ TWL LRG LVL3 (GOWN DISPOSABLE) ×2 IMPLANT
GOWN STRL REUS W/TWL LRG LVL3 (GOWN DISPOSABLE) ×2
KIT PLEURX DRAIN CATH 15.5FR (DRAIN) ×2 IMPLANT
KIT RM TURNOVER STRD PROC AR (KITS) ×2 IMPLANT
LABEL OR SOLS (LABEL) ×2 IMPLANT
MARKER SKIN DUAL TIP RULER LAB (MISCELLANEOUS) ×2 IMPLANT
PACK BASIN MINOR ARMC (MISCELLANEOUS) ×2 IMPLANT
SUCTION FRAZIER HANDLE 10FR (MISCELLANEOUS) ×1
SUCTION TUBE FRAZIER 10FR DISP (MISCELLANEOUS) ×1 IMPLANT
SUT ETH BLK MONO 3 0 FS 1 12/B (SUTURE) ×4 IMPLANT
SUT ETHILON 3-0 FS-10 30 BLK (SUTURE) ×2
SUT ETHILON 4-0 (SUTURE) ×1
SUT ETHILON 4-0 FS2 18XMFL BLK (SUTURE) ×1
SUT SILK 0 (SUTURE) ×1
SUT SILK 0 30XBRD TIE 6 (SUTURE) ×1 IMPLANT
SUT SILK 1 SH (SUTURE) ×2 IMPLANT
SUT VIC AB 0 SH 27 (SUTURE) ×2 IMPLANT
SUT VIC AB 2-0 SH 27 (SUTURE) ×1
SUT VIC AB 2-0 SH 27XBRD (SUTURE) ×1 IMPLANT
SUT VIC AB 3-0 SH 27 (SUTURE) ×1
SUT VIC AB 3-0 SH 27X BRD (SUTURE) ×1 IMPLANT
SUTURE EHLN 3-0 FS-10 30 BLK (SUTURE) ×1 IMPLANT
SUTURE ETHLN 4-0 FS2 18XMF BLK (SUTURE) ×1 IMPLANT
SYR 30ML LL (SYRINGE) ×2 IMPLANT
SYRINGE 10CC LL (SYRINGE) ×2 IMPLANT

## 2015-09-18 NOTE — Anesthesia Preprocedure Evaluation (Signed)
Anesthesia Evaluation  Patient identified by MRN, date of birth, ID band Patient awake    Reviewed: Allergy & Precautions, H&P , NPO status , Patient's Chart, lab work & pertinent test results  History of Anesthesia Complications Negative for: history of anesthetic complications  Airway Mallampati: II  TM Distance: >3 FB Neck ROM: full    Dental  (+) Poor Dentition, Chipped   Pulmonary shortness of breath and at rest,    Pulmonary exam normal breath sounds clear to auscultation       Cardiovascular Exercise Tolerance: Good hypertension, (-) angina(-) Past MI and (-) DOE Normal cardiovascular exam Rhythm:regular Rate:Normal     Neuro/Psych PSYCHIATRIC DISORDERS negative neurological ROS     GI/Hepatic negative GI ROS, Neg liver ROS, neg GERD  ,  Endo/Other  negative endocrine ROS  Renal/GU negative Renal ROS  negative genitourinary   Musculoskeletal negative musculoskeletal ROS (+)   Abdominal   Peds negative pediatric ROS (+)  Hematology negative hematology ROS (+)   Anesthesia Other Findings Past Medical History:   Depression                                                   Gout                                                         ED (erectile dysfunction)                                    Hypertension                                                Past Surgical History:   TONSILLECTOMY                                                BMI    Body Mass Index   37.62 kg/m 2      Reproductive/Obstetrics negative OB ROS                             Anesthesia Physical  Anesthesia Plan  ASA: III  Anesthesia Plan: General   Post-op Pain Management:    Induction:   Airway Management Planned:   Additional Equipment:   Intra-op Plan:   Post-operative Plan:   Informed Consent: I have reviewed the patients History and Physical, chart, labs and discussed the procedure  including the risks, benefits and alternatives for the proposed anesthesia with the patient or authorized representative who has indicated his/her understanding and acceptance.   Dental Advisory Given  Plan Discussed with: Anesthesiologist, CRNA and Surgeon  Anesthesia Plan Comments:         Anesthesia Quick Evaluation

## 2015-09-18 NOTE — Interval H&P Note (Signed)
History and Physical Interval Note:  09/18/2015 11:22 AM  Stuart Spence  has presented today for surgery, with the diagnosis of PLEURAL FUSION  The various methods of treatment have been discussed with the patient and family. After consideration of risks, benefits and other options for treatment, the patient has consented to  Procedure(s): PLEURX CATH REMOVAL (N/A) as a surgical intervention .  The patient's history has been reviewed, patient examined, no change in status, stable for surgery.  I have reviewed the patient's chart and labs.  Questions were answered to the patient's satisfaction.     Nestor Lewandowsky

## 2015-09-18 NOTE — H&P (View-Only) (Signed)
Stuart Spence Inpatient Post-Op Note  Patient ID: Stuart Spence, male   DOB: Oct 05, 1960, 55 y.o.   MRN: [id number]  HISTORY: He returns today in follow-up. He states that he and his wife have been draining his right-sided Pleurx catheter on a weekly basis but have been unable to obtain more than a small amount of drainage. He does not complain of any significant shortness of breath. He did have a chest x-ray made last week which revealed only a very small pleural effusion.   Filed Vitals:   09/09/15 0949  BP: 128/77  Pulse: 90  Temp: 97.7 F (36.5 C)     EXAM: Resp: Lungs are clear bilaterally But somewhat distant on the right..  No respiratory distress, normal effort. Heart:  Regular without murmurs Neurological: Alert and oriented to person, place, and time. Coordination normal.  Skin: Skin is warm and dry. No rash noted. No diaphoretic. No erythema. No pallor.  Psychiatric: Normal mood and affect. Normal behavior. Judgment and thought content normal.   His Port-A-Cath and Pleurx catheter insertion sites are all clean dry and intact without erythema.  ASSESSMENT: The patient would like to have his Pleurx catheter removed as it is not draining. He is scheduled to undergo radiation therapy to his left arm from his bony metastases. Is also scheduled to get chemotherapy on Friday. I discussed with him the options of removal. He understands that once the catheter is removed it cannot be replaced. We'll go ahead and get an x-ray today. He will have all his routine blood work done prior to his chemotherapy on Friday. We will plan on removing the catheter next week.   PLAN:   We will plan on removing the catheter next week. He was aware of the indications and risks. All questions were answered.    Nestor Lewandowsky, MD

## 2015-09-18 NOTE — Discharge Instructions (Signed)

## 2015-09-18 NOTE — Transfer of Care (Signed)
Immediate Anesthesia Transfer of Care Note  Patient: Stuart Spence  Procedure(s) Performed: Procedure(s): PLEURX CATH REMOVAL (N/A)  Patient Location: PACU  Anesthesia Type:MAC  Level of Consciousness: awake, alert  and oriented  Airway & Oxygen Therapy: Patient Spontanous Breathing and Patient connected to nasal cannula oxygen  Post-op Assessment: Report given to RN and Post -op Vital signs reviewed and stable  Post vital signs: Reviewed and stable  Last Vitals:  Filed Vitals:   09/18/15 1231 09/18/15 1232  BP:  129/69  Pulse:  77  Temp: 36.8 C 36.8 C  Resp:  14    Last Pain:  Filed Vitals:   09/18/15 1233  PainSc: 0-No pain      Patients Stated Pain Goal: 2 (73/42/87 6811)  Complications: No apparent anesthesia complications

## 2015-09-18 NOTE — Op Note (Signed)
  09/18/2015  4:30 PM  PATIENT:  Stuart Spence  55 y.o. male  PRE-OPERATIVE DIAGNOSIS:  Nonfunctioning Right PleurX catheter  POST-OPERATIVE DIAGNOSIS:  Same  PROCEDURE:  Removal of PleurX catheter  SURGEON:  Surgeon(s) and Role:    * Nestor Lewandowsky, MD - Primary  ASSISTANTS: Winfield Rast PAS  ANESTHESIA: Local and IV sedation  INDICATIONS FOR PROCEDURE Status post right PleurX catheter for malignant pleural effusion about 6 weeks ago.  Not draining.  No pleural effusion.  DICTATION: The patient was brought to the OR and placed supine.  Prepped and draped in sterile fashion.  IV sedation and 0.5% Marcaine used.  Area around the PleurX was raised and nodular and concerning for cancer.  Biopsy taken.  Blunt dissection around the catheter exposed the Dacron cuff.  Further traction and dissection removed the catheter in its entirety.  Hemostasis was complete.  Wound was loosely approximated with 3-0 Vicryl and Nylon.  Sterile gauze applied.  Patient tolerated the procedure well and was taken to RR in stable condition.     Nestor Lewandowsky, MD

## 2015-09-18 NOTE — Anesthesia Postprocedure Evaluation (Signed)
Anesthesia Post Note  Patient: Stuart Spence  Procedure(s) Performed: Procedure(s) (LRB): PLEURX CATH REMOVAL (N/A)  Patient location during evaluation: PACU Anesthesia Type: General Level of consciousness: awake and alert Pain management: pain level controlled Vital Signs Assessment: post-procedure vital signs reviewed and stable Respiratory status: spontaneous breathing, nonlabored ventilation, respiratory function stable and patient connected to nasal cannula oxygen Cardiovascular status: blood pressure returned to baseline and stable Postop Assessment: no signs of nausea or vomiting Anesthetic complications: no    Last Vitals:  Filed Vitals:   09/18/15 1315 09/18/15 1335  BP: 132/70 135/72  Pulse: 77 83  Temp: 35.7 C   Resp: 18 18    Last Pain:  Filed Vitals:   09/18/15 1335  PainSc: 0-No pain                 Martha Clan

## 2015-09-19 ENCOUNTER — Inpatient Hospital Stay: Payer: PRIVATE HEALTH INSURANCE

## 2015-09-19 DIAGNOSIS — C7951 Secondary malignant neoplasm of bone: Secondary | ICD-10-CM

## 2015-09-19 DIAGNOSIS — Z5111 Encounter for antineoplastic chemotherapy: Secondary | ICD-10-CM | POA: Diagnosis not present

## 2015-09-19 DIAGNOSIS — R918 Other nonspecific abnormal finding of lung field: Secondary | ICD-10-CM

## 2015-09-19 LAB — SURGICAL PATHOLOGY

## 2015-09-19 MED ORDER — DENOSUMAB 120 MG/1.7ML ~~LOC~~ SOLN
120.0000 mg | Freq: Once | SUBCUTANEOUS | Status: AC
Start: 2015-09-19 — End: 2015-09-19
  Administered 2015-09-19: 120 mg via SUBCUTANEOUS
  Filled 2015-09-19: qty 1.7

## 2015-09-23 ENCOUNTER — Encounter: Payer: Self-pay | Admitting: Unknown Physician Specialty

## 2015-09-23 ENCOUNTER — Ambulatory Visit (INDEPENDENT_AMBULATORY_CARE_PROVIDER_SITE_OTHER): Payer: PRIVATE HEALTH INSURANCE | Admitting: Unknown Physician Specialty

## 2015-09-23 VITALS — BP 138/86 | HR 87 | Temp 97.8°F | Ht 65.5 in | Wt 233.0 lb

## 2015-09-23 DIAGNOSIS — R739 Hyperglycemia, unspecified: Secondary | ICD-10-CM | POA: Diagnosis not present

## 2015-09-23 DIAGNOSIS — E119 Type 2 diabetes mellitus without complications: Secondary | ICD-10-CM | POA: Diagnosis not present

## 2015-09-23 LAB — LIPID PANEL PICCOLO, WAIVED
CHOLESTEROL PICCOLO, WAIVED: 162 mg/dL (ref <200)
Chol/HDL Ratio Piccolo,Waive: 4 mg/dL
HDL CHOL PICCOLO, WAIVED: 41 mg/dL — AB (ref >59)
LDL CHOL CALC PICCOLO WAIVED: 95 mg/dL (ref <100)
Triglycerides Piccolo,Waived: 132 mg/dL (ref <150)
VLDL Chol Calc Piccolo,Waive: 26 mg/dL (ref <30)

## 2015-09-23 LAB — BAYER DCA HB A1C WAIVED: HB A1C (BAYER DCA - WAIVED): 6.4 % (ref <7.0)

## 2015-09-23 NOTE — Progress Notes (Signed)
BP 138/86 mmHg  Pulse 87  Temp(Src) 97.8 F (36.6 C)  Ht 5' 5.5" (1.664 m)  Wt 233 lb (105.688 kg)  BMI 38.17 kg/m2  SpO2 94%   Subjective:    Patient ID: Stuart Spence, male    DOB: Sep 10, 1960, 55 y.o.   MRN: [id number]  HPI: Stuart Spence is a 55 y.o. male  Chief Complaint  Patient presents with  . Hyperglycemia    pt states he was seeing cancer doctor and his BS was elevated, states they wanted him to f/up with [place].   States he has lung cancer and they noted a high blood sugar.  He had a Dr Malachi Bonds and 2 doughnuts that AM.  He was understandably high.  F/U was 97.  No excessive thirst or urination.  Pt does admit to drinking a lot of sugar drinks with his chemotherapy.  He walks a lot with his dogs.  Family hx is significant for sister with DM  Family History  Problem Relation Age of Onset  . Cancer Mother 66    lung  . Heart attack Father 47  . Hypertension Father   . Congestive Heart Failure Father 45    died from  . Diabetes Sister      Relevant past medical, surgical, family and social history reviewed and updated as indicated. Interim medical history since our last visit reviewed. Allergies and medications reviewed and updated.  Review of Systems  Per HPI unless specifically indicated above     Objective:    BP 138/86 mmHg  Pulse 87  Temp(Src) 97.8 F (36.6 C)  Ht 5' 5.5" (1.664 m)  Wt 233 lb (105.688 kg)  BMI 38.17 kg/m2  SpO2 94%  Wt Readings from Last 3 Encounters:  09/23/15 233 lb (105.688 kg)  09/18/15 234 lb (106.142 kg)  09/12/15 234 lb 14.4 oz (106.55 kg)    Physical Exam  Constitutional: He is oriented to person, place, and time. He appears well-developed and well-nourished. No distress.  HENT:  Head: Normocephalic and atraumatic.  Eyes: Conjunctivae and lids are normal. Right eye exhibits no discharge. Left eye exhibits no discharge. No scleral icterus.  Neck: Normal range of motion. Neck supple. No JVD present. Carotid bruit is not  present.  Cardiovascular: Normal rate, regular rhythm and normal heart sounds.   Pulmonary/Chest: Effort normal and breath sounds normal. No respiratory distress.  Abdominal: Normal appearance. There is no splenomegaly or hepatomegaly.  Musculoskeletal: Normal range of motion.  Neurological: He is alert and oriented to person, place, and time.  Skin: Skin is warm, dry and intact. No rash noted. No pallor.  Psychiatric: He has a normal mood and affect. His behavior is normal. Judgment and thought content normal.    Results for orders placed or performed in visit on 09/23/15  Bayer DCA Hb A1c Waived  Result Value Ref Range   Bayer DCA Hb A1c Waived 6.4 <7.0 %  Lipid Panel Piccolo, Waived  Result Value Ref Range   Cholesterol Piccolo, Waived 162 <200 mg/dL   HDL Chol Piccolo, Waived 41 (L) >59 mg/dL   Triglycerides Piccolo,Waived 132 <150 mg/dL   Chol/HDL Ratio Piccolo,Waive 4.0 mg/dL   LDL Chol Calc Piccolo Waived 95 <100 mg/dL   VLDL Chol Calc Piccolo,Waive 26 <30 mg/dL      Assessment & Plan:   Problem List Items Addressed This Visit      Unprioritized   Diabetes ([place])    Hgb A1C is 6.4.  No treatment necessary  at this time.  Pt will modify his sugar drinks and carbohydrates.  [place] up in 3 months and consider lifestyle center       Hyperglycemia, unspecified - Primary   Relevant Orders   Bayer DCA Hb A1c Waived (Completed)   Lipid Panel Piccolo, Waived (Completed)       Follow up plan: Return in about 3 months (around 12/24/2015).

## 2015-09-23 NOTE — Patient Instructions (Signed)
Diabetes Mellitus and Food It is important for you to manage your blood sugar (glucose) level. Your blood glucose level can be greatly affected by what you eat. Eating healthier foods in the appropriate amounts throughout the day at about the same time each day will help you control your blood glucose level. It can also help slow or prevent worsening of your diabetes mellitus. Healthy eating may even help you improve the level of your blood pressure and reach or maintain a healthy weight.  General recommendations for healthful eating and cooking habits include:  Eating meals and snacks regularly. Avoid going long periods of time without eating to lose weight.  Eating a diet that consists mainly of plant-based foods, such as fruits, vegetables, nuts, legumes, and whole grains.  Using low-heat cooking methods, such as baking, instead of high-heat cooking methods, such as deep frying. Work with your dietitian to make sure you understand how to use the Nutrition Facts information on food labels. HOW CAN FOOD AFFECT ME? Carbohydrates Carbohydrates affect your blood glucose level more than any other type of food. Your dietitian will help you determine how many carbohydrates to eat at each meal and teach you how to count carbohydrates. Counting carbohydrates is important to keep your blood glucose at a healthy level, especially if you are using insulin or taking certain medicines for diabetes mellitus. Alcohol Alcohol can cause sudden decreases in blood glucose (hypoglycemia), especially if you use insulin or take certain medicines for diabetes mellitus. Hypoglycemia can be a life-threatening condition. Symptoms of hypoglycemia (sleepiness, dizziness, and disorientation) are similar to symptoms of having too much alcohol.  If your health care provider has given you approval to drink alcohol, do so in moderation and use the following guidelines:  Women should not have more than one drink per day, and men  should not have more than two drinks per day. One drink is equal to:  12 oz of beer.  5 oz of wine.  1 oz of hard liquor.  Do not drink on an empty stomach.  Keep yourself hydrated. Have water, diet soda, or unsweetened iced tea.  Regular soda, juice, and other mixers might contain a lot of carbohydrates and should be counted. WHAT FOODS ARE NOT RECOMMENDED? As you make food choices, it is important to remember that all foods are not the same. Some foods have fewer nutrients per serving than other foods, even though they might have the same number of calories or carbohydrates. It is difficult to get your body what it needs when you eat foods with fewer nutrients. Examples of foods that you should avoid that are high in calories and carbohydrates but low in nutrients include:  Trans fats (most processed foods list trans fats on the Nutrition Facts label).  Regular soda.  Juice.  Candy.  Sweets, such as cake, pie, doughnuts, and cookies.  Fried foods. WHAT FOODS CAN I EAT? Eat nutrient-rich foods, which will nourish your body and keep you healthy. The food you should eat also will depend on several factors, including:  The calories you need.  The medicines you take.  Your weight.  Your blood glucose level.  Your blood pressure level.  Your cholesterol level. You should eat a variety of foods, including:  Protein.  Lean cuts of meat.  Proteins low in saturated fats, such as fish, egg whites, and beans. Avoid processed meats.  Fruits and vegetables.  Fruits and vegetables that may help control blood glucose levels, such as apples, mangoes, and   yams.  Dairy products.  Choose fat-free or low-fat dairy products, such as milk, yogurt, and cheese.  Grains, bread, pasta, and rice.  Choose whole grain products, such as multigrain bread, whole oats, and brown rice. These foods may help control blood pressure.  Fats.  Foods containing healthful fats, such as nuts,  avocado, olive oil, canola oil, and fish. DOES EVERYONE WITH DIABETES MELLITUS HAVE THE SAME MEAL PLAN? Because every person with diabetes mellitus is different, there is not one meal plan that works for everyone. It is very important that you meet with a dietitian who will help you create a meal plan that is just right for you.   This information is not intended to replace advice given to you by your health care provider. Make sure you discuss any questions you have with your health care provider.   Document Released: 12/17/2004 Document Revised: 04/12/2014 Document Reviewed: 02/16/2013 Elsevier Interactive Patient Education 2016 [employer]  

## 2015-09-23 NOTE — Assessment & Plan Note (Addendum)
Hgb A1C is 6.4.  No treatment necessary at this time.  Pt will modify his sugar drinks and carbohydrates.  Floow up in 3 months and consider lifestyle center

## 2015-09-26 ENCOUNTER — Encounter: Payer: Self-pay | Admitting: Cardiothoracic Surgery

## 2015-09-26 ENCOUNTER — Ambulatory Visit (INDEPENDENT_AMBULATORY_CARE_PROVIDER_SITE_OTHER): Payer: PRIVATE HEALTH INSURANCE | Admitting: Cardiothoracic Surgery

## 2015-09-26 VITALS — BP 145/96 | HR 92 | Temp 98.3°F | Wt 238.0 lb

## 2015-09-26 DIAGNOSIS — J910 Malignant pleural effusion: Secondary | ICD-10-CM

## 2015-09-26 NOTE — Progress Notes (Signed)
Stuart Spence Inpatient Post-Op Note  Patient ID: Stuart Spence, male   DOB: 06-22-1960, 55 y.o.   MRN: [id number]  HISTORY: This patient returns today in follow-up. He's been washing his wounds daily. He's had no significant problems with shortness of breath fevers or chills. His wounds have looked good and he has not had any further complaints.   Filed Vitals:   09/26/15 0949  BP: 145/96  Pulse: 92  Temp: 98.3 F (36.8 C)     EXAM: Resp: Lungs are clear bilaterally.  No respiratory distress, normal effort. Heart:  Regular without murmurs Abd:  Abdomen is soft, non distended and non tender. No masses are palpable.  There is no rebound and no guarding.  Neurological: Alert and oriented to person, place, and time. Coordination normal.  Skin: Skin is warm and dry. No rash noted. No diaphoretic. No erythema. No pallor.  the skin site is well approximated. There is some erythema around the wound edges but this looks more inflammatory than infectious. We removed a single nylon stitch securing the wound edges.     ASSESSMENT: Status post Pleurx catheter removal. The pathology from the skin site revealed only inflammation   PLAN:   We removed the single stitch. He will follow-up with [place] as needed.    Nestor Lewandowsky, MD

## 2015-10-03 ENCOUNTER — Ambulatory Visit: Payer: No Typology Code available for payment source

## 2015-10-03 ENCOUNTER — Other Ambulatory Visit: Payer: No Typology Code available for payment source

## 2015-10-03 ENCOUNTER — Ambulatory Visit: Payer: No Typology Code available for payment source | Admitting: Hematology and Oncology

## 2015-10-09 ENCOUNTER — Other Ambulatory Visit: Payer: Self-pay | Admitting: Hematology and Oncology

## 2015-10-09 DIAGNOSIS — C3491 Malignant neoplasm of unspecified part of right bronchus or lung: Secondary | ICD-10-CM

## 2015-10-10 ENCOUNTER — Encounter: Payer: Self-pay | Admitting: Hematology and Oncology

## 2015-10-10 ENCOUNTER — Ambulatory Visit: Payer: No Typology Code available for payment source | Admitting: Hematology and Oncology

## 2015-10-10 ENCOUNTER — Inpatient Hospital Stay: Payer: No Typology Code available for payment source

## 2015-10-10 ENCOUNTER — Inpatient Hospital Stay (HOSPITAL_BASED_OUTPATIENT_CLINIC_OR_DEPARTMENT_OTHER): Payer: No Typology Code available for payment source | Admitting: Hematology and Oncology

## 2015-10-10 ENCOUNTER — Ambulatory Visit: Payer: No Typology Code available for payment source

## 2015-10-10 ENCOUNTER — Other Ambulatory Visit: Payer: No Typology Code available for payment source

## 2015-10-10 ENCOUNTER — Inpatient Hospital Stay: Payer: No Typology Code available for payment source | Attending: Hematology and Oncology

## 2015-10-10 VITALS — BP 151/81 | HR 105 | Temp 97.2°F | Ht 66.0 in | Wt 239.4 lb

## 2015-10-10 DIAGNOSIS — C3491 Malignant neoplasm of unspecified part of right bronchus or lung: Secondary | ICD-10-CM | POA: Diagnosis not present

## 2015-10-10 DIAGNOSIS — C7951 Secondary malignant neoplasm of bone: Secondary | ICD-10-CM

## 2015-10-10 DIAGNOSIS — J910 Malignant pleural effusion: Secondary | ICD-10-CM | POA: Diagnosis not present

## 2015-10-10 DIAGNOSIS — Z801 Family history of malignant neoplasm of trachea, bronchus and lung: Secondary | ICD-10-CM | POA: Insufficient documentation

## 2015-10-10 DIAGNOSIS — M109 Gout, unspecified: Secondary | ICD-10-CM | POA: Diagnosis not present

## 2015-10-10 DIAGNOSIS — T380X5S Adverse effect of glucocorticoids and synthetic analogues, sequela: Secondary | ICD-10-CM | POA: Diagnosis not present

## 2015-10-10 DIAGNOSIS — Z79899 Other long term (current) drug therapy: Secondary | ICD-10-CM

## 2015-10-10 DIAGNOSIS — C3411 Malignant neoplasm of upper lobe, right bronchus or lung: Secondary | ICD-10-CM | POA: Diagnosis not present

## 2015-10-10 DIAGNOSIS — D649 Anemia, unspecified: Secondary | ICD-10-CM | POA: Insufficient documentation

## 2015-10-10 DIAGNOSIS — Z9981 Dependence on supplemental oxygen: Secondary | ICD-10-CM | POA: Diagnosis not present

## 2015-10-10 DIAGNOSIS — L271 Localized skin eruption due to drugs and medicaments taken internally: Secondary | ICD-10-CM | POA: Diagnosis not present

## 2015-10-10 DIAGNOSIS — Z5111 Encounter for antineoplastic chemotherapy: Secondary | ICD-10-CM | POA: Insufficient documentation

## 2015-10-10 DIAGNOSIS — Z5112 Encounter for antineoplastic immunotherapy: Secondary | ICD-10-CM | POA: Diagnosis not present

## 2015-10-10 LAB — COMPREHENSIVE METABOLIC PANEL
ALT: 23 U/L (ref 17–63)
AST: 26 U/L (ref 15–41)
Albumin: 3.7 g/dL (ref 3.5–5.0)
Alkaline Phosphatase: 128 U/L — ABNORMAL HIGH (ref 38–126)
Anion gap: 9 (ref 5–15)
BUN: 17 mg/dL (ref 6–20)
CO2: 24 mmol/L (ref 22–32)
Calcium: 9 mg/dL (ref 8.9–10.3)
Chloride: 102 mmol/L (ref 101–111)
Creatinine, Ser: 0.95 mg/dL (ref 0.61–1.24)
GFR calc Af Amer: >60 mL/min (ref >60)
GFR calc non Af Amer: >60 mL/min (ref >60)
Glucose, Bld: 176 mg/dL — ABNORMAL HIGH (ref 65–99)
Potassium: 3.9 mmol/L (ref 3.5–5.1)
Sodium: 135 mmol/L (ref 135–145)
Total Bilirubin: 0.5 mg/dL (ref 0.3–1.2)
Total Protein: 8.1 g/dL (ref 6.5–8.1)

## 2015-10-10 LAB — CBC WITH DIFFERENTIAL/PLATELET
Basophils Absolute: 0 10*3/uL (ref 0–0.1)
Basophils Relative: 0 %
Eosinophils Absolute: 0 10*3/uL (ref 0–0.7)
Eosinophils Relative: 0 %
HCT: 38.3 % — ABNORMAL LOW (ref 40.0–52.0)
Hemoglobin: 13.3 g/dL (ref 13.0–18.0)
Lymphocytes Relative: 5 %
Lymphs Abs: 0.8 10*3/uL — ABNORMAL LOW (ref 1.0–3.6)
MCH: 30.5 pg (ref 26.0–34.0)
MCHC: 34.6 g/dL (ref 32.0–36.0)
MCV: 87.9 fL (ref 80.0–100.0)
Monocytes Absolute: 0.7 10*3/uL (ref 0.2–1.0)
Monocytes Relative: 4 %
Neutro Abs: 16.1 10*3/uL — ABNORMAL HIGH (ref 1.4–6.5)
Neutrophils Relative %: 91 %
Platelets: 301 10*3/uL (ref 150–440)
RBC: 4.36 MIL/uL — ABNORMAL LOW (ref 4.40–5.90)
RDW: 16.6 % — ABNORMAL HIGH (ref 11.5–14.5)
WBC: 17.6 10*3/uL — ABNORMAL HIGH (ref 3.8–10.6)

## 2015-10-10 LAB — TSH: TSH: 0.692 u[IU]/mL (ref 0.350–4.500)

## 2015-10-10 LAB — MAGNESIUM: Magnesium: 2 mg/dL (ref 1.7–2.4)

## 2015-10-10 MED ORDER — SODIUM CHLORIDE 0.9 % IV SOLN
200.0000 mg | Freq: Once | INTRAVENOUS | Status: AC
  Administered 2015-10-10: 200 mg via INTRAVENOUS
  Filled 2015-10-10: qty 8

## 2015-10-10 MED ORDER — SODIUM CHLORIDE 0.9% FLUSH
10.0000 mL | INTRAVENOUS | Status: DC | PRN
  Filled 2015-10-10: qty 10

## 2015-10-10 MED ORDER — CYANOCOBALAMIN 1000 MCG/ML IJ SOLN
1000.0000 ug | Freq: Once | INTRAMUSCULAR | Status: AC
  Administered 2015-10-10: 1000 ug via INTRAMUSCULAR
  Filled 2015-10-10: qty 1

## 2015-10-10 MED ORDER — SODIUM CHLORIDE 0.9 % IV SOLN
Freq: Once | INTRAVENOUS | Status: AC
  Administered 2015-10-10: 11:00:00 via INTRAVENOUS
  Filled 2015-10-10: qty 1000

## 2015-10-10 MED ORDER — PEMETREXED DISODIUM CHEMO INJECTION 500 MG
1100.0000 mg | Freq: Once | INTRAVENOUS | Status: AC
  Administered 2015-10-10: 1100 mg via INTRAVENOUS
  Filled 2015-10-10: qty 40

## 2015-10-10 MED ORDER — PALONOSETRON HCL INJECTION 0.25 MG/5ML
0.2500 mg | Freq: Once | INTRAVENOUS | Status: AC
  Administered 2015-10-10: 0.25 mg via INTRAVENOUS
  Filled 2015-10-10: qty 5

## 2015-10-10 MED ORDER — SODIUM CHLORIDE 0.9 % IV SOLN: 750.0000 mg | Freq: Once | INTRAVENOUS | Status: DC

## 2015-10-10 MED ORDER — SODIUM CHLORIDE 0.9 % IV SOLN
720.0000 mg | Freq: Once | INTRAVENOUS | Status: AC
  Administered 2015-10-10: 720 mg via INTRAVENOUS
  Filled 2015-10-10: qty 72

## 2015-10-10 MED ORDER — HEPARIN SOD (PORK) LOCK FLUSH 100 UNIT/ML IV SOLN
500.0000 [IU] | Freq: Once | INTRAVENOUS | Status: AC | PRN
  Administered 2015-10-10: 500 [IU]
  Filled 2015-10-10: qty 5

## 2015-10-10 MED ORDER — SODIUM CHLORIDE 0.9 % IV SOLN
10.0000 mg | Freq: Once | INTRAVENOUS | Status: AC
  Administered 2015-10-10: 10 mg via INTRAVENOUS
  Filled 2015-10-10: qty 1

## 2015-10-10 NOTE — Progress Notes (Signed)
Patient here for pre treatment/follow up. No concerns today states he "feels good and strong"

## 2015-10-10 NOTE — Progress Notes (Signed)
[facility] day:  10/10/2015  Chief Complaint: Stuart Spence is a 55 y.o. male with metastatic lung cancer who is seen for assessment on prior to cycle #3 Keytruda plus carboplatin and Alimta.  HPI:  The patient was last seen in the medical oncology clinic on 09/12/2015.  At that time, he felt good.  He was using oxygen prn.  CXR had improved. Calcium was  8.7 on 2 pills a day.   He received cycle #3 carboplatin, Alimta, and Keytruda.  Plan was for PleurX catheter removal.  His calcium supplementation was increased.  He was to have followed up with his PCP regarding his hyperglycemia secondary to Decadron.  He received Xgeva on 09/19/2015.  Pleur-X catheter was removed on 09/18/2014.  During the interim, he went to [facility].  He got some sun.  He is feeling stronger.  He denies any respiratory symptoms.   Past Medical History  Diagnosis Date  . Depression   . Gout   . ED (erectile dysfunction)   . Cancer ([place])     Lung  . On home oxygen therapy   . Personal history of chemotherapy     PT TAKING CHEMO EVERY 3 WEEKS    Past Surgical History  Procedure Laterality Date  . Tonsillectomy    . Portacath placement Left 08/13/2015    Procedure: INSERTION PORT-A-CATH;  Surgeon: Nestor Lewandowsky, MD;  Location: [facility] ORS;  Service: General;  Laterality: Left;  . Chest tube insertion Left 08/13/2015    Procedure: CHEST TUBE INSERTION;  Surgeon: Nestor Lewandowsky, MD;  Location: [facility] ORS;  Service: General;  Laterality: Left;  . Portacath placement    . Chest tube insertion N/A 09/18/2015    Procedure: PLEURX CATH REMOVAL;  Surgeon: Nestor Lewandowsky, MD;  Location: [facility] ORS;  Service: Thoracic;  Laterality: N/A;    Family History  Problem Relation Age of Onset  . Cancer Mother 98    lung  . Heart attack Father 32  . Hypertension Father   . Congestive Heart Failure Father 41    died from  . Diabetes Sister     Social History:  reports that he has never  smoked. He has never used smokeless tobacco. He reports that he does not drink alcohol or use illicit drugs.  He has worked 28 years in the [employer].He notes exposure to chemicals.   He recently switched jobs.  The patient is alone today.  Allergies: No Known Allergies  Current Medications: Current Outpatient Prescriptions  Medication Sig Dispense Refill  . albuterol (PROVENTIL HFA;VENTOLIN HFA) 108 (90 Base) MCG/ACT inhaler Inhale 1-2 puffs into the lungs See admin instructions. Reported on 09/12/2015    . amLODipine (NORVASC) 5 MG tablet Take 1 tablet (5 mg total) by mouth daily. (Patient taking differently: Take 5 mg by mouth every morning. ) 30 tablet 6  . benazepril (LOTENSIN) 40 MG tablet Take 1 tablet (40 mg total) by mouth daily. (Patient taking differently: Take 40 mg by mouth every morning. ) 30 tablet 6  . Calcium Carbonate (CALCIUM 600 PO) Take 1,200 mg by mouth 2 (two) times daily.    . colchicine 0.6 MG tablet Take 0.6 mg by mouth daily as needed (two pills by mouth at onset, the one pill one hour later, maximum three pills per gout flare).    Marland Kitchen dexamethasone (DECADRON) 4 MG tablet 4 mg tablet twice a day the day before chemo, and the day after chemo with  each regimen of carbo/alimta 36 tablet 0  . FLUoxetine (PROZAC) 20 MG capsule Take 2 capsules (40 mg total) by mouth daily. (Patient taking differently: Take 40 mg by mouth every morning. ) 60 capsule 6  . folic acid (FOLVITE) 1 MG tablet Take 1 tablet (1 mg total) by mouth daily. 30 tablet 3  . HYDROcodone-acetaminophen (NORCO/VICODIN) 5-325 MG tablet Take 1 tablet by mouth every 6 (six) hours. Reported on 09/18/2015    . lidocaine-prilocaine (EMLA) cream Apply 1 application topically as needed. 30 g 2  . ondansetron (ZOFRAN) 8 MG tablet Take 1 tablet (8 mg total) by mouth every 8 (eight) hours as needed for nausea or vomiting. 20 tablet 3  . OXYGEN Inhale 2 L into the lungs continuous. Reported on 09/12/2015    . sildenafil  (VIAGRA) 100 MG tablet Take 1 tablet (100 mg total) by mouth daily as needed for erectile dysfunction. 10 tablet 12   No current facility-administered medications for this visit.   Facility-Administered Medications Ordered in Other Visits  Medication Dose Route Frequency Provider Last Rate Last Dose  . heparin lock flush 100 unit/mL  500 Units Intracatheter Once PRN Lequita Asal, MD      . sodium chloride flush (NS) 0.9 % injection 10 mL  10 mL Intracatheter PRN Lequita Asal, MD        Review of Systems:  GENERAL: Feels good. No fevers. Weight up 5 pounds. Baseline weight 235-240 pounds. PERFORMANCE STATUS (ECOG): 1-2 HEENT: No visual changes, runny nose, sore throat, mouth sores or tenderness. Lungs: No shortness of breath. No cough. No hemoptysis.   Cardiac: No chest pain, palpitations, orthopnea, or PND. GI: No nausea, vomiting, diarrhea, constipation, melena or hematochezia. No prior colonoscopy. GU: No urgency, frequency, dysuria, or hematuria.  Musculoskeletal: No back pain. No joint pain. No muscle tenderness. Extremities: No pain or swelling. Skin: No rashes or skin changes. Neuro: No headache, numbness or weakness, balance or coordination issues. Endocrine: No diabetes, thyroid issues, hot flashes or night sweats. Psych: No mood changes, depression or anxiety. Pain: No focal pain. Review of systems: All other systems reviewed and found to be negative.  Physical Exam: Blood pressure 151/81, pulse 105, temperature 97.2 F (36.2 C), temperature source Tympanic, height 5' 6"  (1.676 m), weight 239 lb 6.7 oz (108.6 kg). GENERAL: Well developed, well nourished, heavyset gentleman sitting in the exam room in no acute distress. MENTAL STATUS: Alert and oriented to person, place and time. HEAD: Short dark hair. Goatee. Mild facial erythema (post Decadron).  Normocephalic, atraumatic, face symmetric, no Cushingoid features. EYES: Oval glasses.  Pupils equal round and reactive to light and accomodation. No conjunctivitis or scleral icterus. ENT: Oropharynx clear without lesion. Tongue normal. Mucous membranes moist.  RESPIRATORY: Slight decreased breath sounds right base. No wheezes or rhonchi. Right sided Pleurx catheter. CARDIOVASCULAR: Regular rate and rhythm without murmur, rub or gallop. CHEST:  Right port-a-cath. ABDOMEN: Soft, non-tender, with active bowel sounds, and no hepatosplenomegaly. No masses. SKIN: No rashes, ulcers or lesions. EXTREMITIES: 1+ lower extremity edema.  No skin discoloration or tenderness. No palpable cords. LYMPH NODES: No palpable cervical, supraclavicular, axillary or inguinal adenopathy  NEUROLOGICAL: Unremarkable. PSYCH: Appropriate.   Infusion on 10/10/2015  Component Date Value Ref Range Status  . WBC 10/10/2015 17.6* 3.8 - 10.6 K/uL Final  . RBC 10/10/2015 4.36* 4.40 - 5.90 MIL/uL Final  . Hemoglobin 10/10/2015 13.3  13.0 - 18.0 g/dL Final  . HCT 10/10/2015 38.3* 40.0 - 52.0 %  Final  . MCV 10/10/2015 87.9  80.0 - 100.0 fL Final  . MCH 10/10/2015 30.5  26.0 - 34.0 pg Final  . MCHC 10/10/2015 34.6  32.0 - 36.0 g/dL Final  . RDW 10/10/2015 16.6* 11.5 - 14.5 % Final  . Platelets 10/10/2015 301  150 - 440 K/uL Final  . Neutrophils Relative % 10/10/2015 91   Final  . Neutro Abs 10/10/2015 16.1* 1.4 - 6.5 K/uL Final  . Lymphocytes Relative 10/10/2015 5   Final  . Lymphs Abs 10/10/2015 0.8* 1.0 - 3.6 K/uL Final  . Monocytes Relative 10/10/2015 4   Final  . Monocytes Absolute 10/10/2015 0.7  0.2 - 1.0 K/uL Final  . Eosinophils Relative 10/10/2015 0   Final  . Eosinophils Absolute 10/10/2015 0.0  0 - 0.7 K/uL Final  . Basophils Relative 10/10/2015 0   Final  . Basophils Absolute 10/10/2015 0.0  0 - 0.1 K/uL Final  . Sodium 10/10/2015 135  135 - 145 mmol/L Final  . Potassium 10/10/2015 3.9  3.5 - 5.1 mmol/L Final  . Chloride 10/10/2015 102  101 - 111 mmol/L Final  . CO2 10/10/2015  24  22 - 32 mmol/L Final  . Glucose, Bld 10/10/2015 176* 65 - 99 mg/dL Final  . BUN 10/10/2015 17  6 - 20 mg/dL Final  . Creatinine, Ser 10/10/2015 0.95  0.61 - 1.24 mg/dL Final  . Calcium 10/10/2015 9.0  8.9 - 10.3 mg/dL Final  . Total Protein 10/10/2015 8.1  6.5 - 8.1 g/dL Final  . Albumin 10/10/2015 3.7  3.5 - 5.0 g/dL Final  . AST 10/10/2015 26  15 - 41 U/L Final  . ALT 10/10/2015 23  17 - 63 U/L Final  . Alkaline Phosphatase 10/10/2015 128* 38 - 126 U/L Final  . Total Bilirubin 10/10/2015 0.5  0.3 - 1.2 mg/dL Final  . GFR calc non Af Amer 10/10/2015 >60  >60 mL/min Final  . GFR calc Af Amer 10/10/2015 >60  >60 mL/min Final   Comment: (NOTE) The eGFR has been calculated using the CKD EPI equation. This calculation has not been validated in all clinical situations. eGFR's persistently <60 mL/min signify possible Chronic Kidney Disease.   . Anion gap 10/10/2015 9  5 - 15 Final  . Magnesium 10/10/2015 2.0  1.7 - 2.4 mg/dL Final    Assessment:  Hilary Pundt is a 55 y.o. male with stage IV adenocarcinoma of the lung.  He has no smoking history.  He presented with with a large right sided pleural effusion and an ill-defined 4.5 x 4.5 cm mass in the right upper lobe. Thoracentesis x 2 of approximately 4.7 liters of blood fluid revealed adenocarcinoma. TTF-1 and Napsin A were immunoreactive and consistent with a lung primary. Pleur-X catheter was placed on 08/13/2015 (removed 09/18/2015).  Chest CT angiogram on 08/10/2015 revealed and ill-defined 4.5 x 4.5 cm area of hypodensity in the right upper lobe with small areas of air bronchogram. This was incompletely characterized but is concerning for a centrally obstructing mass. There was complete occlusion of the right upper, right middle, and right lower lobe bronchi with complete collapse of the right lung. There was a large right pleural effusion. There was a lytic lesion involving the right fourth rib compatible with metastatic disease.  There was no CT evidence of pulmonary embolism.  PET scan on 08/20/2015 revealed a hypermetabolic 5.2 cm medial right upper lobe lung mass, consistent with primary bronchogenic carcinoma, with a broad attachment to the medial right upper  lobe pleura.  There was interlobular septal thickening throughout the right lung suggested a component of lymphangitic tumor.  There was a malignant small right pleural effusion with hypermetabolism throughout the right pleural space.  There was hypermetabolic ipsilateral hilar, subcarinal and ipsilateral mediastinal nodal metastases.  There was extensive hypermetabolic lytic osseous metastases throughout the axial and proximal appendicular skeleton.    Regarding the skeleton, there were numerous hypermetabolic faintly lytic osseous metastases in the left humeral head (SUV 6.8), right acromion, bilateral ribs most prominent in the anterior right fourth rib  (SUV 16.0), thoracolumbar spine (T11 vertebral body with SUV 9.4), bilateral iliac bones (medial right iliac bone with SUV 12.1 and left anterior acetabulum with SUV 10.0), and right proximal femur (SUV 10.0 in the region of the right lesser trochanter).   Plain films on 08/21/2015 revealed a left humerus lytic lesion with some thinning and some absence of the cortical margin.  He received radiation.  CEA was 837.1 on 09/12/2015 and 448 on 10/10/2015.  Anemia work-up on 09/04/2015 revealed a ferritin (519), iron saturation (18%), B12 (3372), and folate (21.9).  He is s/p 2 cycles of Keytruda, carboplatin and Alimta (08/22/2015 - 09/12/2015 ).   Pleur-X catheter was removed on 09/18/2015.  He began monthly Xgeva on 08/22/2015 (last 09/19/2015).   Symptomatically, he feels good.  Exam is unremarkable.  Plan: 1.  Labs today:  CBC with diff, CMP, Mg, CEA, LDH. 2.  Cycle #3 carboplatin, Alimta, and Keytruda. 3.  Anticipate restaging scans after 4 cycles of chemotherapy. 4.  Obtain radiation summary. 5.  B12  today 6.  RTC on 10/17/2015 for BMP and Xgeva 7.  RTC in 3 weeks for for MD assess, labs (CBC with diff, CMP, Mg, CEA), and cycle #4 Keytruda, carboplatin, Alimta.   Lequita Asal, MD  10/10/2015, 10:20 AM

## 2015-10-11 LAB — CEA: CEA: 448 ng/mL — ABNORMAL HIGH (ref 0.0–4.7)

## 2015-10-17 ENCOUNTER — Inpatient Hospital Stay: Payer: No Typology Code available for payment source

## 2015-10-17 DIAGNOSIS — C7951 Secondary malignant neoplasm of bone: Secondary | ICD-10-CM

## 2015-10-17 DIAGNOSIS — Z5111 Encounter for antineoplastic chemotherapy: Secondary | ICD-10-CM | POA: Diagnosis not present

## 2015-10-17 DIAGNOSIS — R918 Other nonspecific abnormal finding of lung field: Secondary | ICD-10-CM

## 2015-10-17 LAB — CBC WITH DIFFERENTIAL/PLATELET
Basophils Absolute: 0 10*3/uL (ref 0–0.1)
Basophils Relative: 1 %
Eosinophils Absolute: 0.1 10*3/uL (ref 0–0.7)
Eosinophils Relative: 2 %
HCT: 38.5 % — ABNORMAL LOW (ref 40.0–52.0)
Hemoglobin: 13.3 g/dL (ref 13.0–18.0)
Lymphocytes Relative: 22 %
Lymphs Abs: 1.1 10*3/uL (ref 1.0–3.6)
MCH: 30.2 pg (ref 26.0–34.0)
MCHC: 34.6 g/dL (ref 32.0–36.0)
MCV: 87.3 fL (ref 80.0–100.0)
Monocytes Absolute: 0.3 10*3/uL (ref 0.2–1.0)
Monocytes Relative: 7 %
Neutro Abs: 3.4 10*3/uL (ref 1.4–6.5)
Neutrophils Relative %: 68 %
Platelets: 141 10*3/uL — ABNORMAL LOW (ref 150–440)
RBC: 4.41 MIL/uL (ref 4.40–5.90)
RDW: 16.4 % — ABNORMAL HIGH (ref 11.5–14.5)
WBC: 5 10*3/uL (ref 3.8–10.6)

## 2015-10-17 LAB — BASIC METABOLIC PANEL
Anion gap: 7 (ref 5–15)
BUN: 23 mg/dL — ABNORMAL HIGH (ref 6–20)
CO2: 28 mmol/L (ref 22–32)
Calcium: 9.2 mg/dL (ref 8.9–10.3)
Chloride: 100 mmol/L — ABNORMAL LOW (ref 101–111)
Creatinine, Ser: 1.04 mg/dL (ref 0.61–1.24)
GFR calc Af Amer: >60 mL/min (ref >60)
GFR calc non Af Amer: >60 mL/min (ref >60)
Glucose, Bld: 114 mg/dL — ABNORMAL HIGH (ref 65–99)
Potassium: 4.2 mmol/L (ref 3.5–5.1)
Sodium: 135 mmol/L (ref 135–145)

## 2015-10-17 MED ORDER — DENOSUMAB 120 MG/1.7ML ~~LOC~~ SOLN
120.0000 mg | Freq: Once | SUBCUTANEOUS | Status: AC
  Administered 2015-10-17: 120 mg via SUBCUTANEOUS
  Filled 2015-10-17: qty 1.7

## 2015-10-31 ENCOUNTER — Inpatient Hospital Stay: Payer: No Typology Code available for payment source

## 2015-10-31 ENCOUNTER — Inpatient Hospital Stay (HOSPITAL_BASED_OUTPATIENT_CLINIC_OR_DEPARTMENT_OTHER): Payer: No Typology Code available for payment source | Admitting: Hematology and Oncology

## 2015-10-31 ENCOUNTER — Other Ambulatory Visit: Payer: Self-pay | Admitting: Hematology and Oncology

## 2015-10-31 VITALS — BP 129/83 | HR 90 | Temp 97.2°F | Resp 18 | Wt 243.5 lb

## 2015-10-31 DIAGNOSIS — Z5111 Encounter for antineoplastic chemotherapy: Secondary | ICD-10-CM | POA: Diagnosis not present

## 2015-10-31 DIAGNOSIS — J910 Malignant pleural effusion: Secondary | ICD-10-CM

## 2015-10-31 DIAGNOSIS — C3411 Malignant neoplasm of upper lobe, right bronchus or lung: Secondary | ICD-10-CM

## 2015-10-31 DIAGNOSIS — Z9981 Dependence on supplemental oxygen: Secondary | ICD-10-CM

## 2015-10-31 DIAGNOSIS — C3491 Malignant neoplasm of unspecified part of right bronchus or lung: Secondary | ICD-10-CM

## 2015-10-31 DIAGNOSIS — C7951 Secondary malignant neoplasm of bone: Secondary | ICD-10-CM

## 2015-10-31 DIAGNOSIS — L271 Localized skin eruption due to drugs and medicaments taken internally: Secondary | ICD-10-CM

## 2015-10-31 DIAGNOSIS — T380X5S Adverse effect of glucocorticoids and synthetic analogues, sequela: Secondary | ICD-10-CM

## 2015-10-31 DIAGNOSIS — D649 Anemia, unspecified: Secondary | ICD-10-CM

## 2015-10-31 DIAGNOSIS — Z79899 Other long term (current) drug therapy: Secondary | ICD-10-CM

## 2015-10-31 DIAGNOSIS — R918 Other nonspecific abnormal finding of lung field: Secondary | ICD-10-CM

## 2015-10-31 LAB — COMPREHENSIVE METABOLIC PANEL
ALT: 42 U/L (ref 17–63)
AST: 30 U/L (ref 15–41)
Albumin: 4.3 g/dL (ref 3.5–5.0)
Alkaline Phosphatase: 104 U/L (ref 38–126)
Anion gap: 8 (ref 5–15)
BUN: 17 mg/dL (ref 6–20)
CO2: 24 mmol/L (ref 22–32)
Calcium: 9.7 mg/dL (ref 8.9–10.3)
Chloride: 102 mmol/L (ref 101–111)
Creatinine, Ser: 1 mg/dL (ref 0.61–1.24)
GFR calc Af Amer: >60 mL/min (ref >60)
GFR calc non Af Amer: >60 mL/min (ref >60)
Glucose, Bld: 158 mg/dL — ABNORMAL HIGH (ref 65–99)
Potassium: 4.1 mmol/L (ref 3.5–5.1)
Sodium: 134 mmol/L — ABNORMAL LOW (ref 135–145)
Total Bilirubin: 0.7 mg/dL (ref 0.3–1.2)
Total Protein: 8 g/dL (ref 6.5–8.1)

## 2015-10-31 LAB — CBC WITH DIFFERENTIAL/PLATELET
Basophils Absolute: 0 10*3/uL (ref 0–0.1)
Basophils Relative: 0 %
Eosinophils Absolute: 0 10*3/uL (ref 0–0.7)
Eosinophils Relative: 0 %
HCT: 38.6 % — ABNORMAL LOW (ref 40.0–52.0)
Hemoglobin: 13.4 g/dL (ref 13.0–18.0)
Lymphocytes Relative: 12 %
Lymphs Abs: 0.8 10*3/uL — ABNORMAL LOW (ref 1.0–3.6)
MCH: 31.4 pg (ref 26.0–34.0)
MCHC: 34.8 g/dL (ref 32.0–36.0)
MCV: 90.2 fL (ref 80.0–100.0)
Monocytes Absolute: 1.1 10*3/uL — ABNORMAL HIGH (ref 0.2–1.0)
Monocytes Relative: 15 %
Neutro Abs: 5.3 10*3/uL (ref 1.4–6.5)
Neutrophils Relative %: 73 %
Platelets: 325 10*3/uL (ref 150–440)
RBC: 4.27 MIL/uL — ABNORMAL LOW (ref 4.40–5.90)
RDW: 20.7 % — ABNORMAL HIGH (ref 11.5–14.5)
WBC: 7.2 10*3/uL (ref 3.8–10.6)

## 2015-10-31 LAB — MAGNESIUM: Magnesium: 1.8 mg/dL (ref 1.7–2.4)

## 2015-10-31 MED ORDER — SODIUM CHLORIDE 0.9 % IV SOLN: 750.0000 mg | Freq: Once | INTRAVENOUS | Status: DC

## 2015-10-31 MED ORDER — SODIUM CHLORIDE 0.9 % IV SOLN
Freq: Once | INTRAVENOUS | Status: AC
  Administered 2015-10-31: 11:00:00 via INTRAVENOUS
  Filled 2015-10-31: qty 1000

## 2015-10-31 MED ORDER — SODIUM CHLORIDE 0.9% FLUSH
10.0000 mL | INTRAVENOUS | Status: DC | PRN
  Administered 2015-10-31: 10 mL
  Filled 2015-10-31: qty 10

## 2015-10-31 MED ORDER — SODIUM CHLORIDE 0.9 % IV SOLN
10.0000 mg | Freq: Once | INTRAVENOUS | Status: AC
  Administered 2015-10-31: 10 mg via INTRAVENOUS
  Filled 2015-10-31: qty 1

## 2015-10-31 MED ORDER — HEPARIN SOD (PORK) LOCK FLUSH 100 UNIT/ML IV SOLN
500.0000 [IU] | Freq: Once | INTRAVENOUS | Status: AC | PRN
  Administered 2015-10-31: 500 [IU]
  Filled 2015-10-31: qty 5

## 2015-10-31 MED ORDER — PEMETREXED DISODIUM CHEMO INJECTION 500 MG
1100.0000 mg | Freq: Once | INTRAVENOUS | Status: AC
  Administered 2015-10-31: 1100 mg via INTRAVENOUS
  Filled 2015-10-31: qty 40

## 2015-10-31 MED ORDER — SODIUM CHLORIDE 0.9 % IV SOLN
720.0000 mg | Freq: Once | INTRAVENOUS | Status: AC
  Administered 2015-10-31: 720 mg via INTRAVENOUS
  Filled 2015-10-31: qty 72

## 2015-10-31 MED ORDER — PALONOSETRON HCL INJECTION 0.25 MG/5ML
0.2500 mg | Freq: Once | INTRAVENOUS | Status: AC
  Administered 2015-10-31: 0.25 mg via INTRAVENOUS
  Filled 2015-10-31: qty 5

## 2015-10-31 MED ORDER — SODIUM CHLORIDE 0.9 % IV SOLN
200.0000 mg | Freq: Once | INTRAVENOUS | Status: AC
  Administered 2015-10-31: 200 mg via INTRAVENOUS
  Filled 2015-10-31: qty 8

## 2015-10-31 NOTE — Progress Notes (Addendum)
[facility] day:  10/31/15  Chief Complaint: [name] is a 55 y.o. male with metastatic lung cancer who is seen for assessment on prior to cycle #4 Keytruda plus carboplatin and Alimta.  HPI:  The patient was last seen in the medical oncology clinic on 10/10/2015.  At that time, he felt felt stronger.  He denied any respiratory symptoms.  Pleur-X catheter had been removed on 09/18/2015.  He was using oxygen prn.  He received cycle #4 uneventfully.  He received Xgeva on 10/17/2015.  During the interim, he has done well.  He voices no complaints.  He uses oxygen if he is outside and it is hot.   Past Medical History:  Diagnosis Date  . Cancer ([place])    Lung  . Depression   . ED (erectile dysfunction)   . Gout   . On home oxygen therapy   . Personal history of chemotherapy    PT TAKING CHEMO EVERY 3 WEEKS    Past Surgical History:  Procedure Laterality Date  . CHEST TUBE INSERTION Left 08/13/2015   Procedure: CHEST TUBE INSERTION;  Surgeon: Nestor Lewandowsky, MD;  Location: [facility] ORS;  Service: General;  Laterality: Left;  . CHEST TUBE INSERTION N/A 09/18/2015   Procedure: PLEURX CATH REMOVAL;  Surgeon: Nestor Lewandowsky, MD;  Location: [facility] ORS;  Service: Thoracic;  Laterality: N/A;  . PORTACATH PLACEMENT Left 08/13/2015   Procedure: INSERTION PORT-A-CATH;  Surgeon: Nestor Lewandowsky, MD;  Location: [facility] ORS;  Service: General;  Laterality: Left;  . PORTACATH PLACEMENT    . TONSILLECTOMY      Family History  Problem Relation Age of Onset  . Cancer Mother 62    lung  . Heart attack Father 11  . Hypertension Father   . Congestive Heart Failure Father 77    died from  . Diabetes Sister     Social History:  reports that he has never smoked. He has never used smokeless tobacco. He reports that he does not drink alcohol or use drugs.  He has worked 28 years in the [employer].He notes exposure to chemicals.   He recently switched jobs.   Patient's wife name is [name].  Allergies: No Known Allergies  Current Medications: Current Outpatient Prescriptions  Medication Sig Dispense Refill  . albuterol (PROVENTIL HFA;VENTOLIN HFA) 108 (90 Base) MCG/ACT inhaler Inhale 1-2 puffs into the lungs See admin instructions. Reported on 09/12/2015    . amLODipine (NORVASC) 5 MG tablet Take 1 tablet (5 mg total) by mouth daily. (Patient taking differently: Take 5 mg by mouth every morning. ) 30 tablet 6  . benazepril (LOTENSIN) 40 MG tablet Take 1 tablet (40 mg total) by mouth daily. (Patient taking differently: Take 40 mg by mouth every morning. ) 30 tablet 6  . Calcium Carbonate (CALCIUM 600 PO) Take 1,200 mg by mouth 2 (two) times daily.    . colchicine 0.6 MG tablet Take 0.6 mg by mouth daily as needed (two pills by mouth at onset, the one pill one hour later, maximum three pills per gout flare).    Marland Kitchen dexamethasone (DECADRON) 4 MG tablet 4 mg tablet twice a day the day before chemo, and the day after chemo with each regimen of carbo/alimta 36 tablet 0  . FLUoxetine (PROZAC) 20 MG capsule Take 2 capsules (40 mg total) by mouth daily. (Patient taking differently: Take 40 mg by mouth every morning. ) 60 capsule 6  . folic acid (FOLVITE) 1 MG  tablet Take 1 tablet (1 mg total) by mouth daily. 30 tablet 3  . HYDROcodone-acetaminophen (NORCO/VICODIN) 5-325 MG tablet Take 1 tablet by mouth every 6 (six) hours. Reported on 09/18/2015    . lidocaine-prilocaine (EMLA) cream Apply 1 application topically as needed. 30 g 2  . ondansetron (ZOFRAN) 8 MG tablet Take 1 tablet (8 mg total) by mouth every 8 (eight) hours as needed for nausea or vomiting. 20 tablet 3  . OXYGEN Inhale 2 L into the lungs continuous. Reported on 09/12/2015    . sildenafil (VIAGRA) 100 MG tablet Take 1 tablet (100 mg total) by mouth daily as needed for erectile dysfunction. 10 tablet 12   No current facility-administered medications for this visit.     Review of Systems:  GENERAL:  Feels good. No fevers. Weight up 4 pounds. Baseline weight 235-240 pounds. PERFORMANCE STATUS (ECOG): 1-2 HEENT: No visual changes, runny nose, sore throat, mouth sores or tenderness. Lungs: No shortness of breath. Uses oxygen when outside in heat.  No cough. No hemoptysis.   Cardiac: No chest pain, palpitations, orthopnea, or PND. GI: No nausea, vomiting, diarrhea, constipation, melena or hematochezia. No prior colonoscopy. GU: No urgency, frequency, dysuria, or hematuria.  Musculoskeletal: No back pain. No joint pain. No muscle tenderness. Extremities: No pain or swelling. Skin: No rashes or skin changes. Neuro: No headache, numbness or weakness, balance or coordination issues. Endocrine: Elevated blood sugar with steroids.  No diabetes, thyroid issues, hot flashes or night sweats. Psych: No mood changes, depression or anxiety. Pain: No focal pain. Review of systems: All other systems reviewed and found to be negative.  Physical Exam: Blood pressure 129/83, pulse 90, temperature 97.2 F (36.2 C), temperature source Tympanic, resp. rate 18, weight 243 lb 8 oz (110.5 kg), SpO2 98 %. GENERAL: Well developed, well nourished, heavyset gentleman sitting in the exam room in no acute distress. MENTAL STATUS: Alert and oriented to person, place and time. HEAD: Short dark hair. Goatee. Mild facial erythema (post Decadron).  Normocephalic, atraumatic, face symmetric, no Cushingoid features. EYES: Oval glasses. Pupils equal round and reactive to light and accomodation. No conjunctivitis or scleral icterus. ENT: Oropharynx clear without lesion. Tongue normal. Mucous membranes moist.  RESPIRATORY: Slight decreased breath sounds right base. No wheezes or rhonchi. CARDIOVASCULAR: Regular rate and rhythm without murmur, rub or gallop. CHEST:  Right port-a-cath. ABDOMEN: Soft, non-tender, with active bowel sounds, and no hepatosplenomegaly. No masses. SKIN: No rashes,  ulcers or lesions. EXTREMITIES: Mild chronic lower extremity changes.  No skin discoloration or tenderness. No palpable cords. LYMPH NODES: No palpable cervical, supraclavicular, axillary or inguinal adenopathy  NEUROLOGICAL: Unremarkable. PSYCH: Appropriate.   Appointment on 10/31/2015  Component Date Value Ref Range Status  . WBC 10/31/2015 7.2  3.8 - 10.6 K/uL Final  . RBC 10/31/2015 4.27* 4.40 - 5.90 MIL/uL Final  . Hemoglobin 10/31/2015 13.4  13.0 - 18.0 g/dL Final  . HCT 10/31/2015 38.6* 40.0 - 52.0 % Final  . MCV 10/31/2015 90.2  80.0 - 100.0 fL Final  . MCH 10/31/2015 31.4  26.0 - 34.0 pg Final  . MCHC 10/31/2015 34.8  32.0 - 36.0 g/dL Final  . RDW 10/31/2015 20.7* 11.5 - 14.5 % Final  . Platelets 10/31/2015 325  150 - 440 K/uL Final  . Neutrophils Relative % 10/31/2015 73  % Final  . Neutro Abs 10/31/2015 5.3  1.4 - 6.5 K/uL Final  . Lymphocytes Relative 10/31/2015 12  % Final  . Lymphs Abs 10/31/2015 0.8* 1.0 -  3.6 K/uL Final  . Monocytes Relative 10/31/2015 15  % Final  . Monocytes Absolute 10/31/2015 1.1* 0.2 - 1.0 K/uL Final  . Eosinophils Relative 10/31/2015 0  % Final  . Eosinophils Absolute 10/31/2015 0.0  0 - 0.7 K/uL Final  . Basophils Relative 10/31/2015 0  % Final  . Basophils Absolute 10/31/2015 0.0  0 - 0.1 K/uL Final  . Sodium 10/31/2015 134* 135 - 145 mmol/L Final  . Potassium 10/31/2015 4.1  3.5 - 5.1 mmol/L Final  . Chloride 10/31/2015 102  101 - 111 mmol/L Final  . CO2 10/31/2015 24  22 - 32 mmol/L Final  . Glucose, Bld 10/31/2015 158* 65 - 99 mg/dL Final  . BUN 10/31/2015 17  6 - 20 mg/dL Final  . Creatinine, Ser 10/31/2015 1.00  0.61 - 1.24 mg/dL Final  . Calcium 10/31/2015 9.7  8.9 - 10.3 mg/dL Final  . Total Protein 10/31/2015 8.0  6.5 - 8.1 g/dL Final  . Albumin 10/31/2015 4.3  3.5 - 5.0 g/dL Final  . AST 10/31/2015 30  15 - 41 U/L Final  . ALT 10/31/2015 42  17 - 63 U/L Final  . Alkaline Phosphatase 10/31/2015 104  38 - 126 U/L Final  .  Total Bilirubin 10/31/2015 0.7  0.3 - 1.2 mg/dL Final  . GFR calc non Af Amer 10/31/2015 >60  >60 mL/min Final  . GFR calc Af Amer 10/31/2015 >60  >60 mL/min Final   Comment: (NOTE) The eGFR has been calculated using the CKD EPI equation. This calculation has not been validated in all clinical situations. eGFR's persistently <60 mL/min signify possible Chronic Kidney Disease.   . Anion gap 10/31/2015 8  5 - 15 Final  . Magnesium 10/31/2015 1.8  1.7 - 2.4 mg/dL Final    Assessment:  Stuart Spence is a 55 y.o. male with stage IV adenocarcinoma of the lung.  He has no smoking history.  He presented with with a large right sided pleural effusion and an ill-defined 4.5 x 4.5 cm mass in the right upper lobe. Thoracentesis x 2 of approximately 4.7 liters of blood fluid revealed adenocarcinoma. TTF-1 and Napsin A were immunoreactive and consistent with a lung primary. Pleur-X catheter was placed on 08/13/2015 (removed 09/18/2015).  Chest CT angiogram on 08/10/2015 revealed and ill-defined 4.5 x 4.5 cm area of hypodensity in the right upper lobe with small areas of air bronchogram. This was incompletely characterized but is concerning for a centrally obstructing mass. There was complete occlusion of the right upper, right middle, and right lower lobe bronchi with complete collapse of the right lung. There was a large right pleural effusion. There was a lytic lesion involving the right fourth rib compatible with metastatic disease. There was no CT evidence of pulmonary embolism.  PET scan on 08/20/2015 revealed a hypermetabolic 5.2 cm medial right upper lobe lung mass, consistent with primary bronchogenic carcinoma, with a broad attachment to the medial right upper lobe pleura.  There was interlobular septal thickening throughout the right lung suggested a component of lymphangitic tumor.  There was a malignant small right pleural effusion with hypermetabolism throughout the right pleural space.  There  was hypermetabolic ipsilateral hilar, subcarinal and ipsilateral mediastinal nodal metastases.  There was extensive hypermetabolic lytic osseous metastases throughout the axial and proximal appendicular skeleton.    Regarding the skeleton, there were numerous hypermetabolic faintly lytic osseous metastases in the left humeral head (SUV 6.8), right acromion, bilateral ribs most prominent in the anterior right fourth rib  (  SUV 16.0), thoracolumbar spine (T11 vertebral body with SUV 9.4), bilateral iliac bones (medial right iliac bone with SUV 12.1 and left anterior acetabulum with SUV 10.0), and right proximal femur (SUV 10.0 in the region of the right lesser trochanter).   Plain films on 08/21/2015 revealed a left humerus lytic lesion with some thinning and some absence of the cortical margin.  He received 800 cGy to the left humerus on 09/17/2015.  CEA was 837.1 on 09/12/2015, 448 on 10/10/2015, and 346 on 10/31/2015.  Anemia work-up on 09/04/2015 revealed a ferritin (519), iron saturation (18%), B12 (3372), and folate (21.9).  He is s/p 3 cycles of Keytruda, carboplatin and Alimta (08/22/2015 - 10/10/2015).   Pleur-X catheter was removed on 09/18/2015.  He began monthly Xgeva on 08/22/2015 (last 10/17/2015).   Symptomatically, he feels good.  Exam is stable.  Plan: 1.  Labs today:  CBC with diff, CMP, Mg, CEA. 2.  Cycle #4 carboplatin, Alimta, and Keytruda. 3.  Schedule PET scan re: restaging prior to single agent Keytruda. 4.  Obtain radiation summary- done. 5.  RTC on 11/14/2015 for BMP and Xgeva 6.  RTC on 11/21/2015 for for MD assess, labs (CBC with diff, CMP, Mg, CEA), review of scans, and cycle #1 Keytruda alone.   Lequita Asal, MD  10/31/2015, 10:31 AM

## 2015-10-31 NOTE — Progress Notes (Signed)
Patient is here for follow up, he is doing very well no complaints. He would like notes for his wife in case she does not come in time.

## 2015-11-01 LAB — CEA: CEA: 346 ng/mL — ABNORMAL HIGH (ref 0.0–4.7)

## 2015-11-02 ENCOUNTER — Encounter: Payer: Self-pay | Admitting: Hematology and Oncology

## 2015-11-03 ENCOUNTER — Other Ambulatory Visit: Payer: Self-pay | Admitting: *Deleted

## 2015-11-03 ENCOUNTER — Ambulatory Visit
Admission: RE | Admit: 2015-11-03 | Discharge: 2015-11-03 | Disposition: A | Discharge status: Home / Self Care | Payer: No Typology Code available for payment source | Source: Ambulatory Visit | Attending: Radiation Oncology | Admitting: Radiation Oncology

## 2015-11-03 ENCOUNTER — Encounter: Payer: Self-pay | Admitting: Radiation Oncology

## 2015-11-03 VITALS — BP 131/80 | HR 87 | Temp 97.3°F | Resp 20 | Wt 247.7 lb

## 2015-11-03 DIAGNOSIS — C3491 Malignant neoplasm of unspecified part of right bronchus or lung: Secondary | ICD-10-CM

## 2015-11-03 DIAGNOSIS — C7951 Secondary malignant neoplasm of bone: Secondary | ICD-10-CM | POA: Insufficient documentation

## 2015-11-03 DIAGNOSIS — C50929 Malignant neoplasm of unspecified site of unspecified male breast: Secondary | ICD-10-CM | POA: Insufficient documentation

## 2015-11-03 DIAGNOSIS — Z923 Personal history of irradiation: Secondary | ICD-10-CM | POA: Insufficient documentation

## 2015-11-03 NOTE — Progress Notes (Signed)
Patient seen 1 month s/p single fraction palliative RT to left humerus. Currently on keytruda/carbo/alimta  PCP: Golden Pop, MD REFERRING PHYSICIANL: Nolon Stalls, MD  HPI: this is a 55 yo M with metastatic lung CA on cycle #4 keytruda/carboplatin/alimta who received 8Gy in 1 fx to the left humerus on 09/17/15. Since then he has continue chemo and is doing well overall. Less pain in the shoulder. Breathing improved from earlier in his treatment course. No additional thoracocentesis. Uses oxygen if outside but not today.  EXAM: Vitals:   11/03/15 1337  BP: 131/80  Pulse: 87  Resp: 20  Temp: 97.3 F (36.3 C)   ECOG PS: 1 Awake, alert, NAD. No skin rxn over treatment fields. No pain with percussion over left arm/shoulder. Good strength in left arm/hand - 5/5 same in right. Good breath sounds without focal consolidation. Heart regular.  MEDICAL DECISION MAKING: LABS - labs from 7/28 reviewed as in EMR. IMAGING - next imaging set for later this month  ASSESSMENT/PLAN:  1. Lung ca- tolerating chemo (cycle 4 last Friday), restaging PET scheduled per Dr. Mike Gip.  2. RT to left humerus - tolerated treatment with no complications. F/u PRN 3. RTC - will follow up with RadOnc PRN. 4. patient knows to call with questions or concerns.  Mallie Darting, MD PhD

## 2015-11-04 ENCOUNTER — Ambulatory Visit: Payer: PRIVATE HEALTH INSURANCE | Admitting: Family Medicine

## 2015-11-11 LAB — CYTOLOGY - NON PAP

## 2015-11-14 ENCOUNTER — Encounter (INDEPENDENT_AMBULATORY_CARE_PROVIDER_SITE_OTHER): Payer: Self-pay

## 2015-11-14 ENCOUNTER — Inpatient Hospital Stay: Payer: No Typology Code available for payment source

## 2015-11-14 ENCOUNTER — Inpatient Hospital Stay: Payer: No Typology Code available for payment source | Attending: Hematology and Oncology

## 2015-11-14 DIAGNOSIS — Z5112 Encounter for antineoplastic immunotherapy: Secondary | ICD-10-CM | POA: Insufficient documentation

## 2015-11-14 DIAGNOSIS — Z79899 Other long term (current) drug therapy: Secondary | ICD-10-CM | POA: Insufficient documentation

## 2015-11-14 DIAGNOSIS — Z9981 Dependence on supplemental oxygen: Secondary | ICD-10-CM | POA: Insufficient documentation

## 2015-11-14 DIAGNOSIS — C7951 Secondary malignant neoplasm of bone: Secondary | ICD-10-CM

## 2015-11-14 DIAGNOSIS — C3411 Malignant neoplasm of upper lobe, right bronchus or lung: Secondary | ICD-10-CM | POA: Diagnosis not present

## 2015-11-14 DIAGNOSIS — Z77098 Contact with and (suspected) exposure to other hazardous, chiefly nonmedicinal, chemicals: Secondary | ICD-10-CM | POA: Insufficient documentation

## 2015-11-14 DIAGNOSIS — R918 Other nonspecific abnormal finding of lung field: Secondary | ICD-10-CM

## 2015-11-14 DIAGNOSIS — J910 Malignant pleural effusion: Secondary | ICD-10-CM | POA: Insufficient documentation

## 2015-11-14 DIAGNOSIS — C3491 Malignant neoplasm of unspecified part of right bronchus or lung: Secondary | ICD-10-CM

## 2015-11-14 LAB — BASIC METABOLIC PANEL
Anion gap: 6 (ref 5–15)
BUN: 12 mg/dL (ref 6–20)
CO2: 30 mmol/L (ref 22–32)
Calcium: 8.9 mg/dL (ref 8.9–10.3)
Chloride: 103 mmol/L (ref 101–111)
Creatinine, Ser: 1.05 mg/dL (ref 0.61–1.24)
GFR calc Af Amer: >60 mL/min (ref >60)
GFR calc non Af Amer: >60 mL/min (ref >60)
Glucose, Bld: 136 mg/dL — ABNORMAL HIGH (ref 65–99)
Potassium: 4 mmol/L (ref 3.5–5.1)
Sodium: 139 mmol/L (ref 135–145)

## 2015-11-14 LAB — CBC WITH DIFFERENTIAL/PLATELET
Basophils Absolute: 0 10*3/uL (ref 0–0.1)
Basophils Relative: 0 %
Eosinophils Absolute: 0.1 10*3/uL (ref 0–0.7)
Eosinophils Relative: 2 %
HCT: 34.6 % — ABNORMAL LOW (ref 40.0–52.0)
Hemoglobin: 12 g/dL — ABNORMAL LOW (ref 13.0–18.0)
Lymphocytes Relative: 25 %
Lymphs Abs: 1 10*3/uL (ref 1.0–3.6)
MCH: 31.5 pg (ref 26.0–34.0)
MCHC: 34.7 g/dL (ref 32.0–36.0)
MCV: 90.8 fL (ref 80.0–100.0)
Monocytes Absolute: 0.5 10*3/uL (ref 0.2–1.0)
Monocytes Relative: 13 %
Neutro Abs: 2.4 10*3/uL (ref 1.4–6.5)
Neutrophils Relative %: 60 %
Platelets: 101 10*3/uL — ABNORMAL LOW (ref 150–440)
RBC: 3.81 MIL/uL — ABNORMAL LOW (ref 4.40–5.90)
RDW: 21.3 % — ABNORMAL HIGH (ref 11.5–14.5)
WBC: 4 10*3/uL (ref 3.8–10.6)

## 2015-11-14 MED ORDER — DENOSUMAB 120 MG/1.7ML ~~LOC~~ SOLN
120.0000 mg | Freq: Once | SUBCUTANEOUS | Status: AC
  Administered 2015-11-14: 120 mg via SUBCUTANEOUS
  Filled 2015-11-14: qty 1.7

## 2015-11-17 ENCOUNTER — Telehealth: Payer: Self-pay | Admitting: *Deleted

## 2015-11-17 ENCOUNTER — Ambulatory Visit: Payer: No Typology Code available for payment source

## 2015-11-17 NOTE — Telephone Encounter (Signed)
Went for PET scan and was told it had been cancelled. Asking when it will be rescheduled. I spoke with Velna Hatchet in pre auth who said a determination has not been made form his insurance yet. She will check into this and have schedulers call him to reschedule this appt

## 2015-11-18 ENCOUNTER — Telehealth: Payer: Self-pay | Admitting: *Deleted

## 2015-11-18 NOTE — Telephone Encounter (Signed)
I Had spoke to pt yest. Because his pet scan was cancelled because of insurance not approved and pt was not told. But then the pt said today he got a paper in the mail that it was approved and when I checked into it. His primary ins. [employer] Did approved pet but not his sec. Insurance. Dr/ corcoran called and did peer to peer review and the doctor on the phone would not approved a PET-stating that he coul dhave chest , abd. And pelvis CT with bone scan and if they are positive then he would then approved pet.  I checked with Brandi in insurance and pt has still not met OOP charges so if pt had  PET scan he would be liable for the 20% that his sec. Insurance is not paying for.  The there option is to ask primary insurance to approve the CT and the bone scan and then later after scan poss. PET with both insurances.  The pt states that he will go ahead with PET and pay difference of 20%.  I told him that it would probably be 1200 dollars and pt still wanted to to go ahead with pet scan only being approved by [employer].  I got colette to sch. [facility] and [facility] and he will need to be there at 11 am. Procedure will be 11: 30.  I went over nothing to eat or drink 6 hours prior, all the water he wants. He can take meds with water. No gum or sugar of any kind 12 hours before test. Low carb the night before at dinner.  He does not have to take steroid pill. Pt agreeable to all above.

## 2015-11-20 ENCOUNTER — Encounter
Admission: RE | Admit: 2015-11-20 | Discharge: 2015-11-20 | Disposition: A | Discharge status: Home / Self Care | Payer: PRIVATE HEALTH INSURANCE | Source: Ambulatory Visit | Attending: Hematology and Oncology | Admitting: Hematology and Oncology

## 2015-11-20 DIAGNOSIS — C7951 Secondary malignant neoplasm of bone: Secondary | ICD-10-CM | POA: Insufficient documentation

## 2015-11-20 DIAGNOSIS — C3491 Malignant neoplasm of unspecified part of right bronchus or lung: Secondary | ICD-10-CM | POA: Diagnosis present

## 2015-11-20 LAB — GLUCOSE, CAPILLARY: Glucose-Capillary: 109 mg/dL — ABNORMAL HIGH (ref 65–99)

## 2015-11-20 MED ORDER — FLUDEOXYGLUCOSE F - 18 (FDG) INJECTION
12.0000 | Freq: Once | INTRAVENOUS | Status: AC | PRN
  Administered 2015-11-20: 12.561 via INTRAVENOUS

## 2015-11-21 ENCOUNTER — Inpatient Hospital Stay (HOSPITAL_BASED_OUTPATIENT_CLINIC_OR_DEPARTMENT_OTHER): Payer: No Typology Code available for payment source | Admitting: Hematology and Oncology

## 2015-11-21 ENCOUNTER — Inpatient Hospital Stay: Payer: No Typology Code available for payment source

## 2015-11-21 ENCOUNTER — Other Ambulatory Visit: Payer: Self-pay | Admitting: Hematology and Oncology

## 2015-11-21 VITALS — BP 127/86 | HR 76 | Temp 96.9°F | Resp 18 | Wt 245.4 lb

## 2015-11-21 DIAGNOSIS — C3411 Malignant neoplasm of upper lobe, right bronchus or lung: Secondary | ICD-10-CM

## 2015-11-21 DIAGNOSIS — Z5112 Encounter for antineoplastic immunotherapy: Secondary | ICD-10-CM | POA: Diagnosis not present

## 2015-11-21 DIAGNOSIS — R918 Other nonspecific abnormal finding of lung field: Secondary | ICD-10-CM

## 2015-11-21 DIAGNOSIS — Z9981 Dependence on supplemental oxygen: Secondary | ICD-10-CM

## 2015-11-21 DIAGNOSIS — C3491 Malignant neoplasm of unspecified part of right bronchus or lung: Secondary | ICD-10-CM

## 2015-11-21 DIAGNOSIS — Z79899 Other long term (current) drug therapy: Secondary | ICD-10-CM

## 2015-11-21 DIAGNOSIS — Z77098 Contact with and (suspected) exposure to other hazardous, chiefly nonmedicinal, chemicals: Secondary | ICD-10-CM

## 2015-11-21 DIAGNOSIS — C7951 Secondary malignant neoplasm of bone: Secondary | ICD-10-CM | POA: Diagnosis not present

## 2015-11-21 DIAGNOSIS — J910 Malignant pleural effusion: Secondary | ICD-10-CM

## 2015-11-21 LAB — COMPREHENSIVE METABOLIC PANEL
ALT: 39 U/L (ref 17–63)
AST: 32 U/L (ref 15–41)
Albumin: 4.2 g/dL (ref 3.5–5.0)
Alkaline Phosphatase: 101 U/L (ref 38–126)
Anion gap: 6 (ref 5–15)
BUN: 18 mg/dL (ref 6–20)
CO2: 28 mmol/L (ref 22–32)
Calcium: 9.4 mg/dL (ref 8.9–10.3)
Chloride: 102 mmol/L (ref 101–111)
Creatinine, Ser: 1.33 mg/dL — ABNORMAL HIGH (ref 0.61–1.24)
GFR calc Af Amer: >60 mL/min (ref >60)
GFR calc non Af Amer: 59 mL/min — ABNORMAL LOW (ref >60)
Glucose, Bld: 112 mg/dL — ABNORMAL HIGH (ref 65–99)
Potassium: 4.4 mmol/L (ref 3.5–5.1)
Sodium: 136 mmol/L (ref 135–145)
Total Bilirubin: 0.7 mg/dL (ref 0.3–1.2)
Total Protein: 7.8 g/dL (ref 6.5–8.1)

## 2015-11-21 LAB — CBC WITH DIFFERENTIAL/PLATELET
Basophils Absolute: 0 10*3/uL (ref 0–0.1)
Basophils Relative: 1 %
Eosinophils Absolute: 0.1 10*3/uL (ref 0–0.7)
Eosinophils Relative: 2 %
HCT: 35.4 % — ABNORMAL LOW (ref 40.0–52.0)
Hemoglobin: 12.3 g/dL — ABNORMAL LOW (ref 13.0–18.0)
Lymphocytes Relative: 33 %
Lymphs Abs: 1.1 10*3/uL (ref 1.0–3.6)
MCH: 31.9 pg (ref 26.0–34.0)
MCHC: 34.7 g/dL (ref 32.0–36.0)
MCV: 92 fL (ref 80.0–100.0)
Monocytes Absolute: 0.7 10*3/uL (ref 0.2–1.0)
Monocytes Relative: 19 %
Neutro Abs: 1.6 10*3/uL (ref 1.4–6.5)
Neutrophils Relative %: 45 %
Platelets: 267 10*3/uL (ref 150–440)
RBC: 3.84 MIL/uL — ABNORMAL LOW (ref 4.40–5.90)
RDW: 21.9 % — ABNORMAL HIGH (ref 11.5–14.5)
WBC: 3.5 10*3/uL — ABNORMAL LOW (ref 3.8–10.6)

## 2015-11-21 LAB — MAGNESIUM: Magnesium: 2 mg/dL (ref 1.7–2.4)

## 2015-11-21 MED ORDER — SODIUM CHLORIDE 0.9% FLUSH
10.0000 mL | INTRAVENOUS | Status: DC | PRN
  Administered 2015-11-21: 10 mL
  Filled 2015-11-21: qty 10

## 2015-11-21 MED ORDER — SODIUM CHLORIDE 0.9 % IV SOLN
Freq: Once | INTRAVENOUS | Status: AC
  Administered 2015-11-21: 15:00:00 via INTRAVENOUS
  Filled 2015-11-21: qty 1000

## 2015-11-21 MED ORDER — SODIUM CHLORIDE 0.9 % IV SOLN
200.0000 mg | Freq: Once | INTRAVENOUS | Status: AC
  Administered 2015-11-21: 200 mg via INTRAVENOUS
  Filled 2015-11-21: qty 8

## 2015-11-21 MED ORDER — HEPARIN SOD (PORK) LOCK FLUSH 100 UNIT/ML IV SOLN
500.0000 [IU] | Freq: Once | INTRAVENOUS | Status: AC | PRN
  Administered 2015-11-21: 500 [IU]
  Filled 2015-11-21: qty 5

## 2015-11-21 NOTE — Progress Notes (Signed)
Patient is here with his wife and daughter.

## 2015-11-21 NOTE — Progress Notes (Addendum)
[facility] day:  11/21/2015  Chief Complaint: Stuart Spence is a 55 y.o. male with metastatic lung cancer who is seen for review of interval restaging studies and assessment prior to cycle #5 Keytruda.  HPI:  The patient was last seen in the medical oncology clinic on 10/31/2015.  At that time, he felt good.  He received cycle #4 carboplatin, Alimta, and Keytruda.  He received Xgeva on 11/14/2015.  PET scan on 11/20/2015 revealed a marked positive response to therapy.  There was decrease in size and metabolic activity of RIGHT perihilar mass.  There was resolution of mediastinal nodal metabolic activity.  There was marked decrease in size and metabolic activity of RIGHT anterior chest wall metastasis.  There was decreased in metabolic activity of multiple skeletal metastasis.  Round sclerotic lesions remain which are not metabolically active.  There was one metabolically active lesion does remain in the posterior LEFT acetabulum.  During the interim, he has done well.  He voices no complaints.   Past Medical History:  Diagnosis Date  . Cancer ([place])    Lung  . Depression   . ED (erectile dysfunction)   . Gout   . On home oxygen therapy   . Personal history of chemotherapy    PT TAKING CHEMO EVERY 3 WEEKS    Past Surgical History:  Procedure Laterality Date  . CHEST TUBE INSERTION Left 08/13/2015   Procedure: CHEST TUBE INSERTION;  Surgeon: Nestor Lewandowsky, MD;  Location: [facility] ORS;  Service: General;  Laterality: Left;  . CHEST TUBE INSERTION N/A 09/18/2015   Procedure: PLEURX CATH REMOVAL;  Surgeon: Nestor Lewandowsky, MD;  Location: [facility] ORS;  Service: Thoracic;  Laterality: N/A;  . PORTACATH PLACEMENT Left 08/13/2015   Procedure: INSERTION PORT-A-CATH;  Surgeon: Nestor Lewandowsky, MD;  Location: [facility] ORS;  Service: General;  Laterality: Left;  . PORTACATH PLACEMENT    . TONSILLECTOMY      Family History  Problem Relation Age of Onset  . Cancer Mother  87    lung  . Heart attack Father 39  . Hypertension Father   . Congestive Heart Failure Father 87    died from  . Diabetes Sister     Social History:  reports that he has never smoked. He has never used smokeless tobacco. He reports that he does not drink alcohol or use drugs.  He has worked 75 years in the [employer].He notes exposure to chemicals.   He recently switched jobs.  Patient's wife name is [name].  He is accompanied by his wife and daughter.  Allergies: No Known Allergies  Current Medications: Current Outpatient Prescriptions  Medication Sig Dispense Refill  . albuterol (PROVENTIL HFA;VENTOLIN HFA) 108 (90 Base) MCG/ACT inhaler Inhale 1-2 puffs into the lungs See admin instructions. Reported on 09/12/2015    . amLODipine (NORVASC) 5 MG tablet Take 1 tablet (5 mg total) by mouth daily. (Patient taking differently: Take 5 mg by mouth every morning. ) 30 tablet 6  . benazepril (LOTENSIN) 40 MG tablet Take 1 tablet (40 mg total) by mouth daily. (Patient taking differently: Take 40 mg by mouth every morning. ) 30 tablet 6  . Calcium Carbonate (CALCIUM 600 PO) Take 1,200 mg by mouth 2 (two) times daily.    . colchicine 0.6 MG tablet Take 0.6 mg by mouth daily as needed (two pills by mouth at onset, the one pill one hour later, maximum three pills per gout flare).    Marland Kitchen  dexamethasone (DECADRON) 4 MG tablet 4 mg tablet twice a day the day before chemo, and the day after chemo with each regimen of carbo/alimta 36 tablet 0  . FLUoxetine (PROZAC) 20 MG capsule Take 2 capsules (40 mg total) by mouth daily. (Patient taking differently: Take 40 mg by mouth every morning. ) 60 capsule 6  . folic acid (FOLVITE) 1 MG tablet Take 1 tablet (1 mg total) by mouth daily. 30 tablet 3  . HYDROcodone-acetaminophen (NORCO/VICODIN) 5-325 MG tablet Take 1 tablet by mouth every 6 (six) hours. Reported on 09/18/2015    . lidocaine-prilocaine (EMLA) cream Apply 1 application topically as needed. 30 g 2  .  ondansetron (ZOFRAN) 8 MG tablet Take 1 tablet (8 mg total) by mouth every 8 (eight) hours as needed for nausea or vomiting. 20 tablet 3  . OXYGEN Inhale 2 L into the lungs continuous. Reported on 09/12/2015    . sildenafil (VIAGRA) 100 MG tablet Take 1 tablet (100 mg total) by mouth daily as needed for erectile dysfunction. 10 tablet 12   No current facility-administered medications for this visit.     Review of Systems:  GENERAL: Feels good. No fevers. Weight up 4 pounds. Baseline weight 235-240 pounds. PERFORMANCE STATUS (ECOG): 1-2 HEENT: No visual changes, runny nose, sore throat, mouth sores or tenderness. Lungs: No shortness of breath. Uses oxygen when outside in heat.  No cough. No hemoptysis.   Cardiac: No chest pain, palpitations, orthopnea, or PND. GI: No nausea, vomiting, diarrhea, constipation, melena or hematochezia. No prior colonoscopy. GU: No urgency, frequency, dysuria, or hematuria.  Musculoskeletal: No back pain. No joint pain. No muscle tenderness. Extremities: No pain or swelling. Skin: No rashes or skin changes. Neuro: No headache, numbness or weakness, balance or coordination issues. Endocrine: Elevated blood sugar with steroids.  No diabetes, thyroid issues, hot flashes or night sweats. Psych: No mood changes, depression or anxiety. Pain: No focal pain. Review of systems: All other systems reviewed and found to be negative.  Physical Exam: Blood pressure 127/86, pulse 76, temperature (!) 96.9 F (36.1 C), temperature source Tympanic, resp. rate 18, weight 245 lb 6 oz (111.3 kg), SpO2 96 %. GENERAL: Well developed, well nourished, heavyset gentleman sitting in the exam room in no acute distress. MENTAL STATUS: Alert and oriented to person, place and time. HEAD: Short dark hair. Goatee. Mild facial erythema (post Decadron).  Normocephalic, atraumatic, face symmetric, no Cushingoid features. EYES: Oval glasses. Pupils equal round and  reactive to light and accomodation. No conjunctivitis or scleral icterus. ENT: Oropharynx clear without lesion. Tongue normal. Mucous membranes moist.  RESPIRATORY: Slight decreased breath sounds right base. No wheezes or rhonchi. CARDIOVASCULAR: Regular rate and rhythm without murmur, rub or gallop. CHEST:  Right port-a-cath. ABDOMEN: Soft, non-tender, with active bowel sounds, and no hepatosplenomegaly. No masses. SKIN: No rashes, ulcers or lesions. EXTREMITIES: Mild chronic lower extremity changes.  No skin discoloration or tenderness. No palpable cords. LYMPH NODES: No palpable cervical, supraclavicular, axillary or inguinal adenopathy  NEUROLOGICAL: Unremarkable. PSYCH: Appropriate.   Infusion on 11/21/2015  Component Date Value Ref Range Status  . WBC 11/21/2015 3.5* 3.8 - 10.6 K/uL Final  . RBC 11/21/2015 3.84* 4.40 - 5.90 MIL/uL Final  . Hemoglobin 11/21/2015 12.3* 13.0 - 18.0 g/dL Final  . HCT 11/21/2015 35.4* 40.0 - 52.0 % Final  . MCV 11/21/2015 92.0  80.0 - 100.0 fL Final  . MCH 11/21/2015 31.9  26.0 - 34.0 pg Final  . MCHC 11/21/2015 34.7  32.0 -  36.0 g/dL Final  . RDW 11/21/2015 21.9* 11.5 - 14.5 % Final  . Platelets 11/21/2015 267  150 - 440 K/uL Final  . Neutrophils Relative % 11/21/2015 45  % Final  . Neutro Abs 11/21/2015 1.6  1.4 - 6.5 K/uL Final  . Lymphocytes Relative 11/21/2015 33  % Final  . Lymphs Abs 11/21/2015 1.1  1.0 - 3.6 K/uL Final  . Monocytes Relative 11/21/2015 19  % Final  . Monocytes Absolute 11/21/2015 0.7  0.2 - 1.0 K/uL Final  . Eosinophils Relative 11/21/2015 2  % Final  . Eosinophils Absolute 11/21/2015 0.1  0 - 0.7 K/uL Final  . Basophils Relative 11/21/2015 1  % Final  . Basophils Absolute 11/21/2015 0.0  0 - 0.1 K/uL Final  . Sodium 11/21/2015 136  135 - 145 mmol/L Final  . Potassium 11/21/2015 4.4  3.5 - 5.1 mmol/L Final  . Chloride 11/21/2015 102  101 - 111 mmol/L Final  . CO2 11/21/2015 28  22 - 32 mmol/L Final  .  Glucose, Bld 11/21/2015 112* 65 - 99 mg/dL Final  . BUN 11/21/2015 18  6 - 20 mg/dL Final  . Creatinine, Ser 11/21/2015 1.33* 0.61 - 1.24 mg/dL Final  . Calcium 11/21/2015 9.4  8.9 - 10.3 mg/dL Final  . Total Protein 11/21/2015 7.8  6.5 - 8.1 g/dL Final  . Albumin 11/21/2015 4.2  3.5 - 5.0 g/dL Final  . AST 11/21/2015 32  15 - 41 U/L Final  . ALT 11/21/2015 39  17 - 63 U/L Final  . Alkaline Phosphatase 11/21/2015 101  38 - 126 U/L Final  . Total Bilirubin 11/21/2015 0.7  0.3 - 1.2 mg/dL Final  . GFR calc non Af Amer 11/21/2015 59* >60 mL/min Final  . GFR calc Af Amer 11/21/2015 >60  >60 mL/min Final   Comment: (NOTE) The eGFR has been calculated using the CKD EPI equation. This calculation has not been validated in all clinical situations. eGFR's persistently <60 mL/min signify possible Chronic Kidney Disease.   . Anion gap 11/21/2015 6  5 - 15 Final  . Magnesium 11/21/2015 2.0  1.7 - 2.4 mg/dL Final  . CEA 11/22/2015 261.3* 0.0 - 4.7 ng/mL Final   Comment: (NOTE)       Roche ECLIA methodology       Nonsmokers  <3.9                                     Smokers     <5.6 Performed At: [facility] [place], [place] [id number] [place] [id number]   Hospital Outpatient Visit on 11/20/2015  Component Date Value Ref Range Status  . Glucose-Capillary 11/20/2015 109* 65 - 99 mg/dL Final    Assessment:  Stuart Spence is a 55 y.o. male with stage IV adenocarcinoma of the lung.  He has no smoking history.  He presented with with a large right sided pleural effusion and an ill-defined 4.5 x 4.5 cm mass in the right upper lobe. Thoracentesis x 2 of approximately 4.7 liters of blood fluid revealed adenocarcinoma. TTF-1 and Napsin A were immunoreactive and consistent with a lung primary. Pleur-X catheter was placed on 08/13/2015 (removed 09/18/2015).  Chest CT angiogram on 08/10/2015 revealed and ill-defined 4.5 x 4.5 cm area of hypodensity in the right upper  lobe with small areas of air bronchogram. This was incompletely characterized but is concerning for a  centrally obstructing mass. There was complete occlusion of the right upper, right middle, and right lower lobe bronchi with complete collapse of the right lung. There was a large right pleural effusion. There was a lytic lesion involving the right fourth rib compatible with metastatic disease. There was no CT evidence of pulmonary embolism.  PET scan on 08/20/2015 revealed a hypermetabolic 5.2 cm medial right upper lobe lung mass, consistent with primary bronchogenic carcinoma, with a broad attachment to the medial right upper lobe pleura.  There was interlobular septal thickening throughout the right lung suggested a component of lymphangitic tumor.  There was a malignant small right pleural effusion with hypermetabolism throughout the right pleural space.  There was hypermetabolic ipsilateral hilar, subcarinal and ipsilateral mediastinal nodal metastases.  There was extensive hypermetabolic lytic osseous metastases throughout the axial and proximal appendicular skeleton.    Regarding the skeleton, there were numerous hypermetabolic faintly lytic osseous metastases in the left humeral head (SUV 6.8), right acromion, bilateral ribs most prominent in the anterior right fourth rib  (SUV 16.0), thoracolumbar spine (T11 vertebral body with SUV 9.4), bilateral iliac bones (medial right iliac bone with SUV 12.1 and left anterior acetabulum with SUV 10.0), and right proximal femur (SUV 10.0 in the region of the right lesser trochanter).   Plain films on 08/21/2015 revealed a left humerus lytic lesion with some thinning and some absence of the cortical margin.  He received 800 cGy to the left humerus on 09/17/2015.  CEA was 837.1 on 09/12/2015, 448 on 10/10/2015, 346 on 10/31/2015, and 261.3 on 11/21/2015.  Anemia work-up on 09/04/2015 revealed a ferritin (519), iron saturation (18%), B12 (3372), and folate  (21.9).  He received cycles of Keytruda, carboplatin and Alimta (08/22/2015 - 10/31/2015).   Pleur-X catheter was removed on 09/18/2015.  He receives monthly Xgeva (began 08/22/2015; last 10/17/2015).   PET scan on 11/20/2015 revealed a marked positive response to therapy.  There was decrease in size and metabolic activity of RIGHT perihilar mass, resolution of mediastinal nodal metabolic activity, and marked decrease in size and metabolic activity of RIGHT anterior chest wall metastasis.  There was decreased in metabolic activity of multiple skeletal metastasis.    Symptomatically, he feels good.  Exam is stable.  Plan: 1.  Labs today:  CBC with diff, CMP, Mg, CEA. 2.  Review restaging PET scan.  Excellent response to therapy.  Discuss switching to [facility] alone.  Discuss no need for steroids around treatment. 3.  Cycle #5 Keytruda. 4.  RTC in 3 weeks for MD assessment, labs (CBC with diff, CMP, Mg, TSH), Xgeva and cycle #6 Keytruda.   Lequita Asal, MD  11/21/2015

## 2015-11-22 LAB — CEA: CEA: 261.3 ng/mL — ABNORMAL HIGH (ref 0.0–4.7)

## 2015-11-23 ENCOUNTER — Other Ambulatory Visit: Payer: Self-pay | Admitting: *Deleted

## 2015-11-23 DIAGNOSIS — C7951 Secondary malignant neoplasm of bone: Secondary | ICD-10-CM

## 2015-11-23 DIAGNOSIS — C3491 Malignant neoplasm of unspecified part of right bronchus or lung: Secondary | ICD-10-CM

## 2015-11-30 ENCOUNTER — Encounter: Payer: Self-pay | Admitting: Hematology and Oncology

## 2015-12-12 ENCOUNTER — Inpatient Hospital Stay: Payer: PRIVATE HEALTH INSURANCE

## 2015-12-12 ENCOUNTER — Encounter: Payer: Self-pay | Admitting: Hematology and Oncology

## 2015-12-12 ENCOUNTER — Other Ambulatory Visit: Payer: Self-pay

## 2015-12-12 ENCOUNTER — Inpatient Hospital Stay: Payer: PRIVATE HEALTH INSURANCE | Attending: Hematology and Oncology | Admitting: Hematology and Oncology

## 2015-12-12 ENCOUNTER — Other Ambulatory Visit: Payer: Self-pay | Admitting: Hematology and Oncology

## 2015-12-12 ENCOUNTER — Other Ambulatory Visit: Payer: Self-pay | Admitting: *Deleted

## 2015-12-12 VITALS — BP 121/83 | HR 89 | Temp 94.8°F | Resp 18 | Wt 236.1 lb

## 2015-12-12 DIAGNOSIS — C7951 Secondary malignant neoplasm of bone: Secondary | ICD-10-CM

## 2015-12-12 DIAGNOSIS — Z79899 Other long term (current) drug therapy: Secondary | ICD-10-CM | POA: Diagnosis not present

## 2015-12-12 DIAGNOSIS — C3491 Malignant neoplasm of unspecified part of right bronchus or lung: Secondary | ICD-10-CM

## 2015-12-12 DIAGNOSIS — D649 Anemia, unspecified: Secondary | ICD-10-CM | POA: Insufficient documentation

## 2015-12-12 DIAGNOSIS — J910 Malignant pleural effusion: Secondary | ICD-10-CM | POA: Insufficient documentation

## 2015-12-12 DIAGNOSIS — M109 Gout, unspecified: Secondary | ICD-10-CM | POA: Insufficient documentation

## 2015-12-12 DIAGNOSIS — Z801 Family history of malignant neoplasm of trachea, bronchus and lung: Secondary | ICD-10-CM | POA: Insufficient documentation

## 2015-12-12 DIAGNOSIS — Z9981 Dependence on supplemental oxygen: Secondary | ICD-10-CM | POA: Insufficient documentation

## 2015-12-12 DIAGNOSIS — C3411 Malignant neoplasm of upper lobe, right bronchus or lung: Secondary | ICD-10-CM | POA: Diagnosis not present

## 2015-12-12 DIAGNOSIS — R918 Other nonspecific abnormal finding of lung field: Secondary | ICD-10-CM

## 2015-12-12 DIAGNOSIS — Z5112 Encounter for antineoplastic immunotherapy: Secondary | ICD-10-CM | POA: Diagnosis present

## 2015-12-12 LAB — COMPREHENSIVE METABOLIC PANEL
ALT: 29 U/L (ref 17–63)
AST: 34 U/L (ref 15–41)
Albumin: 4.4 g/dL (ref 3.5–5.0)
Alkaline Phosphatase: 101 U/L (ref 38–126)
Anion gap: 8 (ref 5–15)
BUN: 16 mg/dL (ref 6–20)
CO2: 28 mmol/L (ref 22–32)
Calcium: 9.6 mg/dL (ref 8.9–10.3)
Chloride: 102 mmol/L (ref 101–111)
Creatinine, Ser: 1.3 mg/dL — ABNORMAL HIGH (ref 0.61–1.24)
GFR calc Af Amer: >60 mL/min (ref >60)
GFR calc non Af Amer: >60 mL/min (ref >60)
Glucose, Bld: 103 mg/dL — ABNORMAL HIGH (ref 65–99)
Potassium: 4.3 mmol/L (ref 3.5–5.1)
Sodium: 138 mmol/L (ref 135–145)
Total Bilirubin: 1.1 mg/dL (ref 0.3–1.2)
Total Protein: 8.2 g/dL — ABNORMAL HIGH (ref 6.5–8.1)

## 2015-12-12 LAB — CBC WITH DIFFERENTIAL/PLATELET
Basophils Absolute: 0 10*3/uL (ref 0–0.1)
Basophils Relative: 1 %
Eosinophils Absolute: 0.1 10*3/uL (ref 0–0.7)
Eosinophils Relative: 1 %
HCT: 41.1 % (ref 40.0–52.0)
Hemoglobin: 14.2 g/dL (ref 13.0–18.0)
Lymphocytes Relative: 20 %
Lymphs Abs: 1.1 10*3/uL (ref 1.0–3.6)
MCH: 32.5 pg (ref 26.0–34.0)
MCHC: 34.6 g/dL (ref 32.0–36.0)
MCV: 94 fL (ref 80.0–100.0)
Monocytes Absolute: 0.7 10*3/uL (ref 0.2–1.0)
Monocytes Relative: 12 %
Neutro Abs: 3.9 10*3/uL (ref 1.4–6.5)
Neutrophils Relative %: 66 %
Platelets: 162 10*3/uL (ref 150–440)
RBC: 4.37 MIL/uL — ABNORMAL LOW (ref 4.40–5.90)
RDW: 18.8 % — ABNORMAL HIGH (ref 11.5–14.5)
WBC: 5.8 10*3/uL (ref 3.8–10.6)

## 2015-12-12 LAB — TSH: TSH: 2.352 u[IU]/mL (ref 0.350–4.500)

## 2015-12-12 LAB — MAGNESIUM: Magnesium: 1.9 mg/dL (ref 1.7–2.4)

## 2015-12-12 MED ORDER — HEPARIN SOD (PORK) LOCK FLUSH 100 UNIT/ML IV SOLN
INTRAVENOUS | Status: AC
  Filled 2015-12-12: qty 5

## 2015-12-12 MED ORDER — SODIUM CHLORIDE 0.9 % IJ SOLN
10.0000 mL | Freq: Once | INTRAMUSCULAR | Status: AC
  Administered 2015-12-12: 10 mL via INTRAVENOUS
  Filled 2015-12-12: qty 10

## 2015-12-12 MED ORDER — SODIUM CHLORIDE 0.9 % IV SOLN
Freq: Once | INTRAVENOUS | Status: AC
  Administered 2015-12-12: 15:00:00 via INTRAVENOUS
  Filled 2015-12-12: qty 1000

## 2015-12-12 MED ORDER — SODIUM CHLORIDE 0.9 % IV SOLN
200.0000 mg | Freq: Once | INTRAVENOUS | Status: AC
  Administered 2015-12-12: 200 mg via INTRAVENOUS
  Filled 2015-12-12: qty 8

## 2015-12-12 MED ORDER — DENOSUMAB 120 MG/1.7ML ~~LOC~~ SOLN
120.0000 mg | Freq: Once | SUBCUTANEOUS | Status: AC
  Administered 2015-12-12: 120 mg via SUBCUTANEOUS
  Filled 2015-12-12: qty 1.7

## 2015-12-12 MED ORDER — HEPARIN SOD (PORK) LOCK FLUSH 100 UNIT/ML IV SOLN
500.0000 [IU] | Freq: Once | INTRAVENOUS | Status: AC
  Administered 2015-12-12: 500 [IU] via INTRAVENOUS

## 2015-12-12 NOTE — Progress Notes (Addendum)
[facility] day:  12/12/2015   Chief Complaint: Stuart Spence is a 55 y.o. male with metastatic lung cancer who is seen for review of interval restaging studies and assessment prior to cycle #6 Keytruda.  HPI:  The patient was last seen in the medical oncology clinic on 11/21/2015.  At that time, PET scan revealed a marked response to therapy.  He received cycle #5 Keytruda.  Symptomatically, he feels well.  He denies any shortness of breath or cough.  He denies any pain.   Past Medical History:  Diagnosis Date  . Cancer ([place])    Lung  . Depression   . ED (erectile dysfunction)   . Gout   . On home oxygen therapy   . Personal history of chemotherapy    PT TAKING CHEMO EVERY 3 WEEKS    Past Surgical History:  Procedure Laterality Date  . CHEST TUBE INSERTION Left 08/13/2015   Procedure: CHEST TUBE INSERTION;  Surgeon: Nestor Lewandowsky, MD;  Location: [facility] ORS;  Service: General;  Laterality: Left;  . CHEST TUBE INSERTION N/A 09/18/2015   Procedure: PLEURX CATH REMOVAL;  Surgeon: Nestor Lewandowsky, MD;  Location: [facility] ORS;  Service: Thoracic;  Laterality: N/A;  . PORTACATH PLACEMENT Left 08/13/2015   Procedure: INSERTION PORT-A-CATH;  Surgeon: Nestor Lewandowsky, MD;  Location: [facility] ORS;  Service: General;  Laterality: Left;  . PORTACATH PLACEMENT    . TONSILLECTOMY      Family History  Problem Relation Age of Onset  . Cancer Mother 70    lung  . Heart attack Father 37  . Hypertension Father   . Congestive Heart Failure Father 69    died from  . Diabetes Sister     Social History:  reports that he has never smoked. He has never used smokeless tobacco. He reports that he does not drink alcohol or use drugs.  He has worked 34 years in the [employer].He notes exposure to chemicals.   He recently switched jobs.  Patient's wife name is Stuart Spence.  He is alone today.  Allergies: No Known Allergies  Current Medications: Current Outpatient  Prescriptions  Medication Sig Dispense Refill  . albuterol (PROVENTIL HFA;VENTOLIN HFA) 108 (90 Base) MCG/ACT inhaler Inhale 1-2 puffs into the lungs See admin instructions. Reported on 09/12/2015    . amLODipine (NORVASC) 5 MG tablet Take 1 tablet (5 mg total) by mouth daily. (Patient taking differently: Take 5 mg by mouth every morning. ) 30 tablet 6  . benazepril (LOTENSIN) 40 MG tablet Take 1 tablet (40 mg total) by mouth daily. (Patient taking differently: Take 40 mg by mouth every morning. ) 30 tablet 6  . Calcium Carbonate (CALCIUM 600 PO) Take 1,200 mg by mouth 2 (two) times daily.    . colchicine 0.6 MG tablet Take 0.6 mg by mouth daily as needed (two pills by mouth at onset, the one pill one hour later, maximum three pills per gout flare).    Marland Kitchen dexamethasone (DECADRON) 4 MG tablet 4 mg tablet twice a day the day before chemo, and the day after chemo with each regimen of carbo/alimta 36 tablet 0  . FLUoxetine (PROZAC) 20 MG capsule Take 2 capsules (40 mg total) by mouth daily. (Patient taking differently: Take 40 mg by mouth every morning. ) 60 capsule 6  . folic acid (FOLVITE) 1 MG tablet Take 1 tablet (1 mg total) by mouth daily. 30 tablet 3  . HYDROcodone-acetaminophen (NORCO/VICODIN) 5-325 MG tablet Take  1 tablet by mouth every 6 (six) hours. Reported on 09/18/2015    . lidocaine-prilocaine (EMLA) cream Apply 1 application topically as needed. 30 g 2  . ondansetron (ZOFRAN) 8 MG tablet Take 1 tablet (8 mg total) by mouth every 8 (eight) hours as needed for nausea or vomiting. 20 tablet 3  . OXYGEN Inhale 2 L into the lungs continuous. Reported on 09/12/2015    . sildenafil (VIAGRA) 100 MG tablet Take 1 tablet (100 mg total) by mouth daily as needed for erectile dysfunction. 10 tablet 12   No current facility-administered medications for this visit.     Review of Systems:  GENERAL: Feels good. No fevers or sweats.  Active. Weight down 9 pounds. Baseline weight 235-240  pounds. PERFORMANCE STATUS (ECOG): 1 HEENT: No visual changes, runny nose, sore throat, mouth sores or tenderness. Lungs: No shortness of breath. Uses oxygen when outside in heat.  No cough. No hemoptysis.   Cardiac: No chest pain, palpitations, orthopnea, or PND. GI: No nausea, vomiting, diarrhea, constipation, melena or hematochezia. No prior colonoscopy. GU: No urgency, frequency, dysuria, or hematuria.  Musculoskeletal: No back pain. No joint pain. No muscle tenderness. Extremities: No pain or swelling. Skin: No rashes or skin changes. Neuro: No headache, numbness or weakness, balance or coordination issues. Endocrine: No diabetes, thyroid issues, hot flashes or night sweats. Psych: No mood changes, depression or anxiety. Pain: No focal pain. Review of systems: All other systems reviewed and found to be negative.  Physical Exam: Blood pressure 121/83, pulse 89, temperature (!) 94.8 F (34.9 C), temperature source Tympanic, resp. rate 18, weight 236 lb 1.8 oz (107.1 kg). GENERAL: Well developed, well nourished, heavyset gentleman sitting in the exam room in no acute distress. MENTAL STATUS: Alert and oriented to person, place and time. HEAD: Short dark hair. Goatee. Mild facial erythema (post Decadron).  Normocephalic, atraumatic, face symmetric, no Cushingoid features. EYES: Oval glasses. Pupils equal round and reactive to light and accomodation. No conjunctivitis or scleral icterus. ENT: Oropharynx clear without lesion. Tongue normal. Mucous membranes moist.  RESPIRATORY: Slight decreased breath sounds right base. No wheezes or rhonchi. CARDIOVASCULAR: Regular rate and rhythm without murmur, rub or gallop. CHEST:  Right port-a-cath. ABDOMEN: Soft, non-tender, with active bowel sounds, and no hepatosplenomegaly. No masses. SKIN: No rashes, ulcers or lesions. EXTREMITIES: Mild chronic lower extremity changes.  No skin discoloration or tenderness. No  palpable cords. LYMPH NODES: No palpable cervical, supraclavicular, axillary or inguinal adenopathy  NEUROLOGICAL: Unremarkable. PSYCH: Appropriate.   Orders Only on 12/12/2015  Component Date Value Ref Range Status  . WBC 12/12/2015 5.8  3.8 - 10.6 K/uL Final  . RBC 12/12/2015 4.37* 4.40 - 5.90 MIL/uL Final  . Hemoglobin 12/12/2015 14.2  13.0 - 18.0 g/dL Final  . HCT 12/12/2015 41.1  40.0 - 52.0 % Final  . MCV 12/12/2015 94.0  80.0 - 100.0 fL Final  . MCH 12/12/2015 32.5  26.0 - 34.0 pg Final  . MCHC 12/12/2015 34.6  32.0 - 36.0 g/dL Final  . RDW 12/12/2015 18.8* 11.5 - 14.5 % Final  . Platelets 12/12/2015 162  150 - 440 K/uL Final  . Neutrophils Relative % 12/12/2015 66  % Final  . Neutro Abs 12/12/2015 3.9  1.4 - 6.5 K/uL Final  . Lymphocytes Relative 12/12/2015 20  % Final  . Lymphs Abs 12/12/2015 1.1  1.0 - 3.6 K/uL Final  . Monocytes Relative 12/12/2015 12  % Final  . Monocytes Absolute 12/12/2015 0.7  0.2 - 1.0  K/uL Final  . Eosinophils Relative 12/12/2015 1  % Final  . Eosinophils Absolute 12/12/2015 0.1  0 - 0.7 K/uL Final  . Basophils Relative 12/12/2015 1  % Final  . Basophils Absolute 12/12/2015 0.0  0 - 0.1 K/uL Final  . Sodium 12/12/2015 138  135 - 145 mmol/L Final  . Potassium 12/12/2015 4.3  3.5 - 5.1 mmol/L Final  . Chloride 12/12/2015 102  101 - 111 mmol/L Final  . CO2 12/12/2015 28  22 - 32 mmol/L Final  . Glucose, Bld 12/12/2015 103* 65 - 99 mg/dL Final  . BUN 12/12/2015 16  6 - 20 mg/dL Final  . Creatinine, Ser 12/12/2015 1.30* 0.61 - 1.24 mg/dL Final  . Calcium 12/12/2015 9.6  8.9 - 10.3 mg/dL Final  . Total Protein 12/12/2015 8.2* 6.5 - 8.1 g/dL Final  . Albumin 12/12/2015 4.4  3.5 - 5.0 g/dL Final  . AST 12/12/2015 34  15 - 41 U/L Final  . ALT 12/12/2015 29  17 - 63 U/L Final  . Alkaline Phosphatase 12/12/2015 101  38 - 126 U/L Final  . Total Bilirubin 12/12/2015 1.1  0.3 - 1.2 mg/dL Final  . GFR calc non Af Amer 12/12/2015 >60  >60 mL/min Final  .  GFR calc Af Amer 12/12/2015 >60  >60 mL/min Final   Comment: (NOTE) The eGFR has been calculated using the CKD EPI equation. This calculation has not been validated in all clinical situations. eGFR's persistently <60 mL/min signify possible Chronic Kidney Disease.   . Anion gap 12/12/2015 8  5 - 15 Final  . Magnesium 12/12/2015 1.9  1.7 - 2.4 mg/dL Final    Assessment:  Yotam Rhine is a 55 y.o. male with stage IV adenocarcinoma of the right lung.  He has no smoking history.  He presented with with a large right sided pleural effusion and an ill-defined 4.5 x 4.5 cm mass in the right upper lobe. Thoracentesis x 2 of approximately 4.7 liters of blood fluid revealed adenocarcinoma. TTF-1 and Napsin A were immunoreactive and consistent with a lung primary. Pleur-X catheter was placed on 08/13/2015 (removed 09/18/2015).  Chest CT angiogram on 08/10/2015 revealed and ill-defined 4.5 x 4.5 cm area of hypodensity in the right upper lobe with small areas of air bronchogram. This was incompletely characterized but is concerning for a centrally obstructing mass. There was complete occlusion of the right upper, right middle, and right lower lobe bronchi with complete collapse of the right lung. There was a large right pleural effusion. There was a lytic lesion involving the right fourth rib compatible with metastatic disease. There was no CT evidence of pulmonary embolism.  PET scan on 08/20/2015 revealed a hypermetabolic 5.2 cm medial right upper lobe lung mass, consistent with primary bronchogenic carcinoma, with a broad attachment to the medial right upper lobe pleura.  There was interlobular septal thickening throughout the right lung suggested a component of lymphangitic tumor.  There was a malignant small right pleural effusion with hypermetabolism throughout the right pleural space.  There was hypermetabolic ipsilateral hilar, subcarinal and ipsilateral mediastinal nodal metastases.  There was  extensive hypermetabolic lytic osseous metastases throughout the axial and proximal appendicular skeleton.    Regarding the skeleton, there were numerous hypermetabolic faintly lytic osseous metastases in the left humeral head (SUV 6.8), right acromion, bilateral ribs most prominent in the anterior right fourth rib  (SUV 16.0), thoracolumbar spine (T11 vertebral body with SUV 9.4), bilateral iliac bones (medial right iliac bone with SUV 12.1  and left anterior acetabulum with SUV 10.0), and right proximal femur (SUV 10.0 in the region of the right lesser trochanter).   Plain films on 08/21/2015 revealed a left humerus lytic lesion with some thinning and some absence of the cortical margin.  He received 800 cGy to the left humerus on 09/17/2015.  CEA was 837.1 on 09/12/2015, 448 on 10/10/2015, 346 on 10/31/2015, and 261.3 on 11/21/2015.  Anemia work-up on 09/04/2015 revealed a ferritin (519), iron saturation (18%), B12 (3372), and folate (21.9).  He received 4 cycles of Keytruda, carboplatin and Alimta (08/22/2015 - 10/31/2015).   He began single agent Keytruda on 11/21/2015.  Pleur-X catheter was removed on 09/18/2015.  He receives monthly Xgeva (began 08/22/2015; last 11/14/2015).   PET scan on 11/20/2015 revealed a marked positive response to therapy.  There was decrease in size and metabolic activity of RIGHT perihilar mass, resolution of mediastinal nodal metabolic activity, and marked decrease in size and metabolic activity of RIGHT anterior chest wall metastasis.  There was decreased in metabolic activity of multiple skeletal metastasis.    Symptomatically, he feels good.  Exam is normal.  Plan: 1.  Labs today:  CBC with diff, CMP, Mg, TSH. 2.  Cycle #6 Keytruda. 3.  Xgeva today.  4.  RTC in 3 weeks for MD assessment, labs (CBC with diff, CMP, CEA), and cycle #7 Keytruda. 5.  RTC in 4 weeks for labs (BMP) and Xgeva.   Lequita Asal, MD  12/12/2015, 2:23 PM

## 2015-12-16 NOTE — Progress Notes (Signed)
* [facility]     Assessment and Plan:  Metastatic lung cancer. 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 --Most recent PET scan from PET scan on 11/20/2015 showed decrease in activity of primary mass and bony mets.   Malignant pleural effusion. -Status post right-sided Pleurx catheter, removed on 09/18/2015  Debility/Deconditioning.  -Patient has been very inactive, he is encouraged to increase his activity level at a slow pace, and continue to increase with a goal of being active for approximately 30 minutes a day. -His oxygen saturation was borderline low but stable. When I ambulated him in the office today, I advised that he can ambulate without the oxygen, but should take the oxygen with him when leaving the house.   Date: 12/16/2015  MRN# [id number] Stuart Spence [birth year]   Stuart Spence is a 55 y.o. old male seen in follow up for chief complaint of  Chief Complaint  Patient presents with  . Follow-up    67morov. breathing is doing well. pt c/o mild non prod cough. denies any sob, wheezing, or cp/tightness. on 2L with exertion & qhs     HPI:   Patient is a 55year old male who presented to the hospital in May of 2017 with cough and respiratory failure. He was found to have large right pleural effusion with total right lung atelectasis. He underwent thoracentesis 2, which was positive for adenocarcinoma, he had a Pleurx catheter placed by thoracic surgery, and was later discharged. He had multiple osseous metastases. He has been started on chemotherapy, and radiation therapy to the left humeral head. He received 4 cycles of Keytruda, carboplatin and Alimta, and is currently about to undergo Keytruda #6.   He is currently on 2L of oxygen at night and with exertion outside the house. He does not have it with him right now, and usually keeps it in the car. He still feels that his  breathing is doing fairly well.  He has an albuterol inhaler but he has not had to use. [facility] [employer] [facility].    Medication:   Outpatient Encounter Prescriptions as of 12/17/2015  Medication Sig  . albuterol (PROVENTIL HFA;VENTOLIN HFA) 108 (90 Base) MCG/ACT inhaler Inhale 1-2 puffs into the lungs See admin instructions. Reported on 09/12/2015  . amLODipine (NORVASC) 5 MG tablet Take 1 tablet (5 mg total) by mouth daily. (Patient taking differently: Take 5 mg by mouth every morning. )  . benazepril (LOTENSIN) 40 MG tablet Take 1 tablet (40 mg total) by mouth daily. (Patient taking differently: Take 40 mg by mouth every morning. )  . Calcium Carbonate (CALCIUM 600 PO) Take 1,200 mg by mouth 2 (two) times daily.  . colchicine 0.6 MG tablet Take 0.6 mg by mouth daily as needed (two pills by mouth at onset, the one pill one hour later, maximum three pills per gout flare).  .Marland Kitchendexamethasone (DECADRON) 4 MG tablet 4 mg tablet twice a day the day before chemo, and the day after chemo with each regimen of carbo/alimta  . FLUoxetine (PROZAC) 20 MG capsule Take 2 capsules (40 mg total) by mouth daily. (Patient taking differently: Take 40 mg by mouth every morning. )  . folic acid (FOLVITE) 1 MG tablet Take 1 tablet (1 mg total) by mouth daily.  .Marland KitchenHYDROcodone-acetaminophen (NORCO/VICODIN) 5-325 MG tablet Take 1 tablet by mouth every 6 (six) hours. Reported on 09/18/2015  . lidocaine-prilocaine (EMLA) cream  Apply 1 application topically as needed.  . ondansetron (ZOFRAN) 8 MG tablet Take 1 tablet (8 mg total) by mouth every 8 (eight) hours as needed for nausea or vomiting.  . OXYGEN Inhale 2 L into the lungs continuous. Reported on 09/12/2015  . sildenafil (VIAGRA) 100 MG tablet Take 1 tablet (100 mg total) by mouth daily as needed for erectile dysfunction.   No facility-administered encounter medications on file as of 12/17/2015.      Allergies:  Review of patient's allergies  indicates no known allergies.  Review of Systems: Gen:  Denies  fever, sweats. HEENT: Denies blurred vision. Cvc:  No dizziness, chest pain or heaviness Resp:   Denies cough or sputum porduction. Gi: Denies swallowing difficulty, stomach pain.  Gu:  Denies bladder incontinence, burning urine Ext:   No Joint pain, stiffness. Skin: No skin rash, easy bruising. Endoc:  No polyuria, polydipsia. Psych: No depression, insomnia. Other:  All other systems were reviewed and found to be negative other than what is mentioned in the HPI.   Physical Examination:   VS: BP 132/76 (BP Location: Left Arm, Cuff Size: Normal)   Pulse 88   Ht '5\' 7"'$  (1.702 m)   Wt 246 lb (111.6 kg)   SpO2 96%   BMI 38.53 kg/m   General Appearance: No distress  Neuro:without focal findings,   HEENT: PERRLA, EOM intact. Pulmonary: normal breath sounds, No wheezing.   CardiovascularNormal S1,S2.  No m/r/g.   Abdomen: Benign, Soft, non-tender. Renal:  No costovertebral tenderness  GU:  Not performed at this time. Endoc: No evident thyromegaly, no signs of acromegaly. Skin:   warm, no rash. Extremities: normal, no cyanosis, clubbing.   LABORATORY PANEL:   CBC  Recent Labs Lab 12/12/15 1249  WBC 5.8  HGB 14.2  HCT 41.1  PLT 162   ------------------------------------------------------------------------------------------------------------------  Chemistries   Recent Labs Lab 12/12/15 1249  NA 138  K 4.3  CL 102  CO2 28  GLUCOSE 103*  BUN 16  CREATININE 1.30*  CALCIUM 9.6  MG 1.9  AST 34  ALT 29  ALKPHOS 101  BILITOT 1.1   ------------------------------------------------------------------------------------------------------------------  Cardiac Enzymes No results for input(s): TROPONINI in the last 168 hours. ------------------------------------------------------------  RADIOLOGY:   No results found for this or any previous visit. Results for orders placed during the hospital  encounter of 09/09/15  DG Chest 2 View   Narrative CLINICAL DATA:  History of right lung cancer. Port-A-Cath placement.  EXAM: CHEST  2 VIEW  COMPARISON:  Chest x-rays dated 08/28/2015 and 08/18/2015. Comparison also to chest CT dated 08/12/2015.  FINDINGS: Right chest wall Port-A-Cath in place with tip adequately positioned at the level of the mid SVC. No pneumothorax seen. The pleural parenchymal opacities overlying the mid and lower zones of the right lung appear stable. Stable right upper lobe mass bordering the paratracheal mediastinum. Left lung remains clear. No new lung findings.  Cardiomegaly appears stable. Overall cardiomediastinal silhouette is stable in size and configuration. Osseous structures about the chest are unremarkable.  IMPRESSION: Stable chest x-ray. Right chest wall Port-A-Cath appears appropriately positioned with tip at the level of the mid SVC. No pneumothorax.   Electronically Signed   By: Franki Cabot M.D.   On: 09/09/2015 13:19    ------------------------------------------------------------------------------------------------------------------  Thank  you for allowing [facility] to assist in the care of your patient. Our recommendations are noted above.  Please contact us if we can be of further service.  Marda Stalker, MD.  [facility] Office Number: [phone]  Patricia Pesa, M.D.  Vilinda Boehringer, M.D.  Merton Border, M.D  12/16/2015

## 2015-12-17 ENCOUNTER — Telehealth: Payer: Self-pay | Admitting: *Deleted

## 2015-12-17 ENCOUNTER — Encounter: Payer: Self-pay | Admitting: Internal Medicine

## 2015-12-17 ENCOUNTER — Ambulatory Visit (INDEPENDENT_AMBULATORY_CARE_PROVIDER_SITE_OTHER): Payer: No Typology Code available for payment source | Admitting: Internal Medicine

## 2015-12-17 VITALS — BP 132/76 | HR 88 | Ht 67.0 in | Wt 246.0 lb

## 2015-12-17 DIAGNOSIS — C3490 Malignant neoplasm of unspecified part of unspecified bronchus or lung: Secondary | ICD-10-CM | POA: Diagnosis not present

## 2015-12-17 NOTE — Telephone Encounter (Signed)
Called patient and LVM that typically he should continue with folic acid 4-6 weeks after last dose of alimta.  He can stop in about 10 days.  If he does need a refill he can let us know.

## 2015-12-17 NOTE — Telephone Encounter (Signed)
  Typically will stay on for 4-6 weeks after last dose of Alimta. Thus, can stop in a about 10 days.  M

## 2015-12-17 NOTE — Telephone Encounter (Signed)
Asking if he still needs to be taking Folic Acid, if, he will need a refill.. Please advise

## 2015-12-18 ENCOUNTER — Other Ambulatory Visit: Payer: Self-pay | Admitting: Family Medicine

## 2015-12-24 ENCOUNTER — Encounter: Payer: Self-pay | Admitting: Family Medicine

## 2015-12-24 ENCOUNTER — Ambulatory Visit (INDEPENDENT_AMBULATORY_CARE_PROVIDER_SITE_OTHER): Payer: PRIVATE HEALTH INSURANCE | Admitting: Family Medicine

## 2015-12-24 DIAGNOSIS — I10 Essential (primary) hypertension: Secondary | ICD-10-CM | POA: Diagnosis not present

## 2015-12-24 DIAGNOSIS — C3491 Malignant neoplasm of unspecified part of right bronchus or lung: Secondary | ICD-10-CM

## 2015-12-24 DIAGNOSIS — R739 Hyperglycemia, unspecified: Secondary | ICD-10-CM

## 2015-12-24 LAB — BAYER DCA HB A1C WAIVED: HB A1C: 5.3 % (ref <7.0)

## 2015-12-24 LAB — MICROALBUMIN, URINE WAIVED
Creatinine, Urine Waived: 200 mg/dL (ref 10–300)
Microalb, Ur Waived: 30 mg/L — ABNORMAL HIGH (ref 0–19)
Microalb/Creat Ratio: <30 mg/g (ref <30)

## 2015-12-24 MED ORDER — SILDENAFIL CITRATE 100 MG PO TABS: 100.0000 mg | ORAL_TABLET | Freq: Every day | ORAL | 12 refills | Status: DC | PRN

## 2015-12-24 MED ORDER — SILDENAFIL CITRATE 20 MG PO TABS: 20.0000 mg | ORAL_TABLET | Freq: Every day | ORAL | 12 refills | Status: DC | PRN

## 2015-12-24 MED ORDER — BENAZEPRIL HCL 40 MG PO TABS: 40.0000 mg | ORAL_TABLET | Freq: Every day | ORAL | 6 refills | Status: DC

## 2015-12-24 MED ORDER — TELMISARTAN 40 MG PO TABS: 40.0000 mg | ORAL_TABLET | Freq: Every day | ORAL | 6 refills | Status: DC

## 2015-12-24 MED ORDER — AMLODIPINE BESYLATE 5 MG PO TABS: 5.0000 mg | ORAL_TABLET | Freq: Every day | ORAL | 6 refills | Status: DC

## 2015-12-24 NOTE — Assessment & Plan Note (Signed)
The current medical regimen is effective;  continue present plan and medications.  

## 2015-12-24 NOTE — Assessment & Plan Note (Signed)
Patient's hemoglobin A1c in the normal range with no evidence of diabetes felt elevated glucose was related to prednisone used during chemotherapy.

## 2015-12-24 NOTE — Assessment & Plan Note (Signed)
Doing stable.

## 2015-12-24 NOTE — Progress Notes (Signed)
BP 132/80 (BP Location: Left Arm)   Pulse 70   Temp 97.9 F (36.6 C)   Ht '5\' 5"'$  (1.651 m)   Wt 243 lb 9.6 oz (110.5 kg)   BMI 40.54 kg/m    Subjective:    Patient ID: Stuart Spence, male    DOB: 1960/11/09, 55 y.o.   MRN: [id number]  HPI: Stuart Spence is a 55 y.o. male  Chief Complaint  Patient presents with  . Follow-up  . Hypertension  . Hyperglycemia   Patient doing well no complaints taking no medications for elevated blood sugar. When patient had elevated blood sugar he been on several rounds of prednisone which was part of chemotherapy. Patient with no diabetes symptoms noted low blood sugar spells  Blood pressures been doing well with no complaints from medications. Taking medicines faithfully  Good reports from oncology on metastatic lung cancer with all lesions shrinking.  Relevant past medical, surgical, family and social history reviewed and updated as indicated. Interim medical history since our last visit reviewed. Allergies and medications reviewed and updated.  Review of Systems  Constitutional: Negative.   Respiratory: Negative.   Cardiovascular: Negative.     Per HPI unless specifically indicated above     Objective:    BP 132/80 (BP Location: Left Arm)   Pulse 70   Temp 97.9 F (36.6 C)   Ht '5\' 5"'$  (1.651 m)   Wt 243 lb 9.6 oz (110.5 kg)   BMI 40.54 kg/m   Wt Readings from Last 3 Encounters:  12/24/15 243 lb 9.6 oz (110.5 kg)  12/17/15 246 lb (111.6 kg)  12/12/15 236 lb 1.8 oz (107.1 kg)    Physical Exam  Constitutional: He is oriented to person, place, and time. He appears well-developed and well-nourished. No distress.  HENT:  Head: Normocephalic and atraumatic.  Right Ear: Hearing normal.  Left Ear: Hearing normal.  Nose: Nose normal.  Eyes: Conjunctivae and lids are normal. Right eye exhibits no discharge. Left eye exhibits no discharge. No scleral icterus.  Cardiovascular: Normal rate, regular rhythm and normal heart sounds.     Pulmonary/Chest: Effort normal and breath sounds normal. No respiratory distress.  Musculoskeletal: Normal range of motion.  Neurological: He is alert and oriented to person, place, and time.  Skin: Skin is intact. No rash noted.  Psychiatric: He has a normal mood and affect. His speech is normal and behavior is normal. Judgment and thought content normal. Cognition and memory are normal.     Results for orders placed or performed in visit on 12/12/15  CBC with Differential  Result Value Ref Range   WBC 5.8 3.8 - 10.6 K/uL   RBC 4.37 (L) 4.40 - 5.90 MIL/uL   Hemoglobin 14.2 13.0 - 18.0 g/dL   HCT 41.1 40.0 - 52.0 %   MCV 94.0 80.0 - 100.0 fL   MCH 32.5 26.0 - 34.0 pg   MCHC 34.6 32.0 - 36.0 g/dL   RDW 18.8 (H) 11.5 - 14.5 %   Platelets 162 150 - 440 K/uL   Neutrophils Relative % 66 %   Neutro Abs 3.9 1.4 - 6.5 K/uL   Lymphocytes Relative 20 %   Lymphs Abs 1.1 1.0 - 3.6 K/uL   Monocytes Relative 12 %   Monocytes Absolute 0.7 0.2 - 1.0 K/uL   Eosinophils Relative 1 %   Eosinophils Absolute 0.1 0 - 0.7 K/uL   Basophils Relative 1 %   Basophils Absolute 0.0 0 - 0.1 K/uL  Comprehensive  metabolic panel  Result Value Ref Range   Sodium 138 135 - 145 mmol/L   Potassium 4.3 3.5 - 5.1 mmol/L   Chloride 102 101 - 111 mmol/L   CO2 28 22 - 32 mmol/L   Glucose, Bld 103 (H) 65 - 99 mg/dL   BUN 16 6 - 20 mg/dL   Creatinine, Ser 1.30 (H) 0.61 - 1.24 mg/dL   Calcium 9.6 8.9 - 10.3 mg/dL   Total Protein 8.2 (H) 6.5 - 8.1 g/dL   Albumin 4.4 3.5 - 5.0 g/dL   AST 34 15 - 41 U/L   ALT 29 17 - 63 U/L   Alkaline Phosphatase 101 38 - 126 U/L   Total Bilirubin 1.1 0.3 - 1.2 mg/dL   GFR calc non Af Amer >60 >60 mL/min   GFR calc Af Amer >60 >60 mL/min   Anion gap 8 5 - 15  Magnesium  Result Value Ref Range   Magnesium 1.9 1.7 - 2.4 mg/dL  TSH  Result Value Ref Range   TSH 2.352 0.350 - 4.500 uIU/mL      Assessment & Plan:   Problem List Items Addressed This Visit      Cardiovascular  and Mediastinum   Essential hypertension    The current medical regimen is effective;  continue present plan and medications.       Relevant Medications   amLODipine (NORVASC) 5 MG tablet   sildenafil (VIAGRA) 100 MG tablet   telmisartan (MICARDIS) 40 MG tablet   sildenafil (REVATIO) 20 MG tablet     Respiratory   Adenocarcinoma, lung (HCC)    Doing stable.        Other   Hypocalcemia - Primary   Relevant Orders   Bayer DCA Hb A1c Waived   Microalbumin, Urine Waived   Hyperglycemia, unspecified    Patient's hemoglobin A1c in the normal range with no evidence of diabetes felt elevated glucose was related to prednisone used during chemotherapy.       Other Visit Diagnoses   None.      Follow up plan: Return in about 6 months (around 06/22/2016) for Physical Exam.

## 2016-01-02 ENCOUNTER — Other Ambulatory Visit: Payer: Self-pay | Admitting: *Deleted

## 2016-01-02 ENCOUNTER — Other Ambulatory Visit: Payer: Self-pay | Admitting: Hematology and Oncology

## 2016-01-02 ENCOUNTER — Inpatient Hospital Stay (HOSPITAL_BASED_OUTPATIENT_CLINIC_OR_DEPARTMENT_OTHER): Payer: PRIVATE HEALTH INSURANCE | Admitting: Hematology and Oncology

## 2016-01-02 ENCOUNTER — Inpatient Hospital Stay: Payer: PRIVATE HEALTH INSURANCE

## 2016-01-02 VITALS — BP 142/82 | HR 75 | Temp 97.9°F | Resp 18 | Wt 245.4 lb

## 2016-01-02 DIAGNOSIS — C3491 Malignant neoplasm of unspecified part of right bronchus or lung: Secondary | ICD-10-CM

## 2016-01-02 DIAGNOSIS — Z9981 Dependence on supplemental oxygen: Secondary | ICD-10-CM

## 2016-01-02 DIAGNOSIS — Z79899 Other long term (current) drug therapy: Secondary | ICD-10-CM

## 2016-01-02 DIAGNOSIS — C7951 Secondary malignant neoplasm of bone: Secondary | ICD-10-CM

## 2016-01-02 DIAGNOSIS — J910 Malignant pleural effusion: Secondary | ICD-10-CM | POA: Diagnosis not present

## 2016-01-02 DIAGNOSIS — D649 Anemia, unspecified: Secondary | ICD-10-CM

## 2016-01-02 DIAGNOSIS — Z5112 Encounter for antineoplastic immunotherapy: Secondary | ICD-10-CM | POA: Diagnosis not present

## 2016-01-02 DIAGNOSIS — M109 Gout, unspecified: Secondary | ICD-10-CM

## 2016-01-02 DIAGNOSIS — Z801 Family history of malignant neoplasm of trachea, bronchus and lung: Secondary | ICD-10-CM

## 2016-01-02 LAB — CBC WITH DIFFERENTIAL/PLATELET
Basophils Absolute: 0.3 10*3/uL — ABNORMAL HIGH (ref 0–0.1)
Basophils Relative: 5 %
Eosinophils Absolute: 0.1 10*3/uL (ref 0–0.7)
Eosinophils Relative: 2 %
HCT: 43.1 % (ref 40.0–52.0)
Hemoglobin: 14.9 g/dL (ref 13.0–18.0)
Lymphocytes Relative: 15 %
Lymphs Abs: 1.1 10*3/uL (ref 1.0–3.6)
MCH: 32.5 pg (ref 26.0–34.0)
MCHC: 34.5 g/dL (ref 32.0–36.0)
MCV: 94.1 fL (ref 80.0–100.0)
Monocytes Absolute: 0.6 10*3/uL (ref 0.2–1.0)
Monocytes Relative: 8 %
Neutro Abs: 4.8 10*3/uL (ref 1.4–6.5)
Neutrophils Relative %: 70 %
Platelets: 206 10*3/uL (ref 150–440)
RBC: 4.57 MIL/uL (ref 4.40–5.90)
RDW: 15.1 % — ABNORMAL HIGH (ref 11.5–14.5)
WBC: 7 10*3/uL (ref 3.8–10.6)

## 2016-01-02 LAB — COMPREHENSIVE METABOLIC PANEL
ALT: 17 U/L (ref 17–63)
AST: 22 U/L (ref 15–41)
Albumin: 4.3 g/dL (ref 3.5–5.0)
Alkaline Phosphatase: 114 U/L (ref 38–126)
Anion gap: 7 (ref 5–15)
BUN: 14 mg/dL (ref 6–20)
CO2: 31 mmol/L (ref 22–32)
Calcium: 9.9 mg/dL (ref 8.9–10.3)
Chloride: 101 mmol/L (ref 101–111)
Creatinine, Ser: 1.27 mg/dL — ABNORMAL HIGH (ref 0.61–1.24)
GFR calc Af Amer: >60 mL/min (ref >60)
GFR calc non Af Amer: >60 mL/min (ref >60)
Glucose, Bld: 127 mg/dL — ABNORMAL HIGH (ref 65–99)
Potassium: 4.8 mmol/L (ref 3.5–5.1)
Sodium: 139 mmol/L (ref 135–145)
Total Bilirubin: 0.7 mg/dL (ref 0.3–1.2)
Total Protein: 8.1 g/dL (ref 6.5–8.1)

## 2016-01-02 MED ORDER — HEPARIN SOD (PORK) LOCK FLUSH 100 UNIT/ML IV SOLN
500.0000 [IU] | Freq: Once | INTRAVENOUS | Status: AC
  Administered 2016-01-02: 500 [IU] via INTRAVENOUS

## 2016-01-02 MED ORDER — SODIUM CHLORIDE 0.9 % IJ SOLN
10.0000 mL | Freq: Once | INTRAMUSCULAR | Status: AC
  Administered 2016-01-02: 10 mL via INTRAVENOUS
  Filled 2016-01-02: qty 10

## 2016-01-02 MED ORDER — HEPARIN SOD (PORK) LOCK FLUSH 100 UNIT/ML IV SOLN
INTRAVENOUS | Status: AC
  Filled 2016-01-02: qty 5

## 2016-01-02 MED ORDER — PEMBROLIZUMAB CHEMO INJECTION 100 MG/4ML
200.0000 mg | Freq: Once | INTRAVENOUS | Status: AC
  Administered 2016-01-02: 200 mg via INTRAVENOUS
  Filled 2016-01-02: qty 8

## 2016-01-02 MED ORDER — SODIUM CHLORIDE 0.9 % IV SOLN
Freq: Once | INTRAVENOUS | Status: AC
  Administered 2016-01-02: 15:00:00 via INTRAVENOUS
  Filled 2016-01-02: qty 1000

## 2016-01-02 NOTE — Progress Notes (Addendum)
[facility] day:  01/02/2016   Chief Complaint: Stuart Spence is a 55 y.o. male with metastatic lung cancer who is seen for assessment prior to cycle #7 Keytruda.  HPI:  The patient was last seen in the medical oncology clinic on 12/12/2015.  At that time, he received cycle #6 Keytruda.  He also received Xgeva.  During the interim, he has done well. He notes no problems.   Past Medical History:  Diagnosis Date  . Cancer ([place])    Lung  . Depression   . ED (erectile dysfunction)   . Gout   . On home oxygen therapy   . Personal history of chemotherapy    PT TAKING CHEMO EVERY 3 WEEKS    Past Surgical History:  Procedure Laterality Date  . CHEST TUBE INSERTION Left 08/13/2015   Procedure: CHEST TUBE INSERTION;  Surgeon: Nestor Lewandowsky, MD;  Location: [facility] ORS;  Service: General;  Laterality: Left;  . CHEST TUBE INSERTION N/A 09/18/2015   Procedure: PLEURX CATH REMOVAL;  Surgeon: Nestor Lewandowsky, MD;  Location: [facility] ORS;  Service: Thoracic;  Laterality: N/A;  . PORTACATH PLACEMENT Left 08/13/2015   Procedure: INSERTION PORT-A-CATH;  Surgeon: Nestor Lewandowsky, MD;  Location: [facility] ORS;  Service: General;  Laterality: Left;  . PORTACATH PLACEMENT    . TONSILLECTOMY      Family History  Problem Relation Age of Onset  . Cancer Mother 27    lung  . Heart attack Father 59  . Hypertension Father   . Congestive Heart Failure Father 69    died from  . Diabetes Sister     Social History:  reports that he has never smoked. He has never used smokeless tobacco. He reports that he does not drink alcohol or use drugs.  He has worked 47 years in the [employer].He notes exposure to chemicals.   He recently switched jobs.  He is not working.  He had previously planned on helping to build 2 houses.  Patient's wife name is [name].  He is alone today.  Allergies: No Known Allergies  Current Medications: Current Outpatient Prescriptions  Medication Sig  Dispense Refill  . albuterol (PROVENTIL HFA;VENTOLIN HFA) 108 (90 Base) MCG/ACT inhaler Inhale 1-2 puffs into the lungs See admin instructions. Reported on 09/12/2015    . amLODipine (NORVASC) 5 MG tablet Take 1 tablet (5 mg total) by mouth daily. 30 tablet 6  . Calcium Carbonate (CALCIUM 600 PO) Take 1,200 mg by mouth 2 (two) times daily.    . colchicine 0.6 MG tablet Take 0.6 mg by mouth daily as needed (two pills by mouth at onset, the one pill one hour later, maximum three pills per gout flare).    Marland Kitchen dexamethasone (DECADRON) 4 MG tablet 4 mg tablet twice a day the day before chemo, and the day after chemo with each regimen of carbo/alimta 36 tablet 0  . FLUoxetine (PROZAC) 20 MG capsule Take 2 capsules (40 mg total) by mouth daily. (Patient taking differently: Take 40 mg by mouth every morning. ) 60 capsule 6  . folic acid (FOLVITE) 1 MG tablet Take 1 tablet (1 mg total) by mouth daily. 30 tablet 3  . HYDROcodone-acetaminophen (NORCO/VICODIN) 5-325 MG tablet Take 1 tablet by mouth every 6 (six) hours. Reported on 09/18/2015    . lidocaine-prilocaine (EMLA) cream Apply 1 application topically as needed. 30 g 2  . ondansetron (ZOFRAN) 8 MG tablet Take 1 tablet (8 mg total) by mouth  every 8 (eight) hours as needed for nausea or vomiting. 20 tablet 3  . OXYGEN Inhale 2 L into the lungs continuous. Reported on 09/12/2015    . sildenafil (REVATIO) 20 MG tablet Take 1 tablet (20 mg total) by mouth daily as needed. 50 tablet 12  . sildenafil (VIAGRA) 100 MG tablet Take 1 tablet (100 mg total) by mouth daily as needed for erectile dysfunction. 10 tablet 12  . telmisartan (MICARDIS) 40 MG tablet Take 1 tablet (40 mg total) by mouth daily. 30 tablet 6   No current facility-administered medications for this visit.     Review of Systems:  GENERAL: Feels good. No fevers or sweats.  Active. Weight up 9 pounds. Baseline weight 235-240 pounds. PERFORMANCE STATUS (ECOG): 1 HEENT: No visual changes, runny  nose, sore throat, mouth sores or tenderness. Lungs: No shortness of breath. Uses oxygen when outside in heat.  No cough. No hemoptysis.   Cardiac: No chest pain, palpitations, orthopnea, or PND. GI: No nausea, vomiting, diarrhea, constipation, melena or hematochezia. No prior colonoscopy. GU: No urgency, frequency, dysuria, or hematuria.  Musculoskeletal: No back pain. No joint pain. No muscle tenderness. Extremities: No pain or swelling. Skin: No rashes or skin changes. Neuro: No headache, numbness or weakness, balance or coordination issues. Endocrine: No diabetes, thyroid issues, hot flashes or night sweats. Psych: No mood changes, depression or anxiety. Pain: No focal pain. Review of systems: All other systems reviewed and found to be negative.  Physical Exam: Blood pressure (!) 142/82, pulse 75, temperature 97.9 F (36.6 C), temperature source Tympanic, resp. rate 18, weight 245 lb 6 oz (111.3 kg). GENERAL: Well developed, well nourished, heavyset gentleman sitting in the exam room in no acute distress. MENTAL STATUS: Alert and oriented to person, place and time. HEAD: Short dark hair. Goatee. Normocephalic, atraumatic, face symmetric, no Cushingoid features. EYES: Oval glasses. Pupils equal round and reactive to light and accomodation. No conjunctivitis or scleral icterus. ENT: Oropharynx clear without lesion. Tongue normal. Mucous membranes moist.  RESPIRATORY: Slight decreased breath sounds right base. No wheezes or rhonchi. CARDIOVASCULAR: Regular rate and rhythm without murmur, rub or gallop. CHEST:  Right port-a-cath. ABDOMEN: Soft, non-tender, with active bowel sounds, and no hepatosplenomegaly. No masses. SKIN: No rashes, ulcers or lesions. EXTREMITIES: Mild chronic lower extremity changes.  No skin discoloration or tenderness. No palpable cords. LYMPH NODES: No palpable cervical, supraclavicular, axillary or inguinal adenopathy   NEUROLOGICAL: Unremarkable. PSYCH: Appropriate.   Appointment on 01/02/2016  Component Date Value Ref Range Status  . WBC 01/02/2016 7.0  3.8 - 10.6 K/uL Final  . RBC 01/02/2016 4.57  4.40 - 5.90 MIL/uL Final  . Hemoglobin 01/02/2016 14.9  13.0 - 18.0 g/dL Final  . HCT 01/02/2016 43.1  40.0 - 52.0 % Final  . MCV 01/02/2016 94.1  80.0 - 100.0 fL Final  . MCH 01/02/2016 32.5  26.0 - 34.0 pg Final  . MCHC 01/02/2016 34.5  32.0 - 36.0 g/dL Final  . RDW 01/02/2016 15.1* 11.5 - 14.5 % Final  . Platelets 01/02/2016 206  150 - 440 K/uL Final  . Neutrophils Relative % 01/02/2016 70  % Final  . Neutro Abs 01/02/2016 4.8  1.4 - 6.5 K/uL Final  . Lymphocytes Relative 01/02/2016 15  % Final  . Lymphs Abs 01/02/2016 1.1  1.0 - 3.6 K/uL Final  . Monocytes Relative 01/02/2016 8  % Final  . Monocytes Absolute 01/02/2016 0.6  0.2 - 1.0 K/uL Final  . Eosinophils Relative 01/02/2016 2  %  Final  . Eosinophils Absolute 01/02/2016 0.1  0 - 0.7 K/uL Final  . Basophils Relative 01/02/2016 5  % Final  . Basophils Absolute 01/02/2016 0.3* 0 - 0.1 K/uL Final  . Sodium 01/02/2016 139  135 - 145 mmol/L Final  . Potassium 01/02/2016 4.8  3.5 - 5.1 mmol/L Final  . Chloride 01/02/2016 101  101 - 111 mmol/L Final  . CO2 01/02/2016 31  22 - 32 mmol/L Final  . Glucose, Bld 01/02/2016 127* 65 - 99 mg/dL Final  . BUN 01/02/2016 14  6 - 20 mg/dL Final  . Creatinine, Ser 01/02/2016 1.27* 0.61 - 1.24 mg/dL Final  . Calcium 01/02/2016 9.9  8.9 - 10.3 mg/dL Final  . Total Protein 01/02/2016 8.1  6.5 - 8.1 g/dL Final  . Albumin 01/02/2016 4.3  3.5 - 5.0 g/dL Final  . AST 01/02/2016 22  15 - 41 U/L Final  . ALT 01/02/2016 17  17 - 63 U/L Final  . Alkaline Phosphatase 01/02/2016 114  38 - 126 U/L Final  . Total Bilirubin 01/02/2016 0.7  0.3 - 1.2 mg/dL Final  . GFR calc non Af Amer 01/02/2016 >60  >60 mL/min Final  . GFR calc Af Amer 01/02/2016 >60  >60 mL/min Final   Comment: (NOTE) The eGFR has been calculated  using the CKD EPI equation. This calculation has not been validated in all clinical situations. eGFR's persistently <60 mL/min signify possible Chronic Kidney Disease.   . Anion gap 01/02/2016 7  5 - 15 Final    Assessment:  Stuart Spence is a 55 y.o. male with stage IV adenocarcinoma of the right lung.  He has no smoking history.  He presented with with a large right sided pleural effusion and an ill-defined 4.5 x 4.5 cm mass in the right upper lobe. Thoracentesis x 2 of approximately 4.7 liters of blood fluid revealed adenocarcinoma. TTF-1 and Napsin A were immunoreactive and consistent with a lung primary. Pleur-X catheter was placed on 08/13/2015 (removed 09/18/2015).  Chest CT angiogram on 08/10/2015 revealed and ill-defined 4.5 x 4.5 cm area of hypodensity in the right upper lobe with small areas of air bronchogram. This was incompletely characterized but is concerning for a centrally obstructing mass. There was complete occlusion of the right upper, right middle, and right lower lobe bronchi with complete collapse of the right lung. There was a large right pleural effusion. There was a lytic lesion involving the right fourth rib compatible with metastatic disease. There was no CT evidence of pulmonary embolism.  PET scan on 08/20/2015 revealed a hypermetabolic 5.2 cm medial right upper lobe lung mass, consistent with primary bronchogenic carcinoma, with a broad attachment to the medial right upper lobe pleura.  There was interlobular septal thickening throughout the right lung suggested a component of lymphangitic tumor.  There was a malignant small right pleural effusion with hypermetabolism throughout the right pleural space.  There was hypermetabolic ipsilateral hilar, subcarinal and ipsilateral mediastinal nodal metastases.  There was extensive hypermetabolic lytic osseous metastases throughout the axial and proximal appendicular skeleton.    Regarding the skeleton, there were numerous  hypermetabolic faintly lytic osseous metastases in the left humeral head (SUV 6.8), right acromion, bilateral ribs most prominent in the anterior right fourth rib  (SUV 16.0), thoracolumbar spine (T11 vertebral body with SUV 9.4), bilateral iliac bones (medial right iliac bone with SUV 12.1 and left anterior acetabulum with SUV 10.0), and right proximal femur (SUV 10.0 in the region of the right lesser trochanter).  Plain films on 08/21/2015 revealed a left humerus lytic lesion with some thinning and some absence of the cortical margin.  He received 800 cGy to the left humerus on 09/17/2015.  CEA was 837.1 on 09/12/2015, 448 on 10/10/2015, 346 on 10/31/2015, 261.3 on 11/21/2015, and 726.2 on 01/02/2016.  Anemia work-up on 09/04/2015 revealed a ferritin (519), iron saturation (18%), B12 (3372), and folate (21.9).  He received 4 cycles of Keytruda, carboplatin and Alimta (08/22/2015 - 10/31/2015).   He subsequently has received single agent Keytruda (11/21/2015 - 12/12/2015).  Pleur-X catheter was removed on 09/18/2015.  He receives monthly Xgeva (began 08/22/2015; last 12/12/2015).   PET scan on 11/20/2015 revealed a marked positive response to therapy.  There was decrease in size and metabolic activity of RIGHT perihilar mass, resolution of mediastinal nodal metabolic activity, and marked decrease in size and metabolic activity of RIGHT anterior chest wall metastasis.  There was decreased in metabolic activity of multiple skeletal metastasis.    Symptomatically, he feels good.  Exam is normal.  Plan: 1.  Labs today:  CBC with diff, CMP, CEA. 2.  Cycle #7 Keytruda. 3.  RTC in 1 week for labs (BMP) and Xgeva. 4.  RTC in 3 weeks for MD assessment, labs (CBC with diff, CMP, CEA, TSH), and cycle #8 Keytruda.  Addendum:  CEA became available after his appointment.  CEA will be checked next week when he comes in for labs.  If CEA still markedly elevated, will repeat imaging studies prior to next  cycle.   Lequita Asal, MD  01/02/2016, 2:27 PM

## 2016-01-02 NOTE — Progress Notes (Signed)
Patient is here for follow up, he already had flu shot

## 2016-01-03 ENCOUNTER — Encounter: Payer: Self-pay | Admitting: Hematology and Oncology

## 2016-01-03 LAB — CEA: CEA: 726.2 ng/mL — ABNORMAL HIGH (ref 0.0–4.7)

## 2016-01-09 ENCOUNTER — Inpatient Hospital Stay: Payer: PRIVATE HEALTH INSURANCE | Attending: Hematology and Oncology

## 2016-01-09 ENCOUNTER — Inpatient Hospital Stay: Payer: PRIVATE HEALTH INSURANCE

## 2016-01-09 ENCOUNTER — Other Ambulatory Visit: Payer: Self-pay

## 2016-01-09 ENCOUNTER — Ambulatory Visit: Payer: PRIVATE HEALTH INSURANCE

## 2016-01-09 DIAGNOSIS — M109 Gout, unspecified: Secondary | ICD-10-CM | POA: Insufficient documentation

## 2016-01-09 DIAGNOSIS — Z9221 Personal history of antineoplastic chemotherapy: Secondary | ICD-10-CM | POA: Insufficient documentation

## 2016-01-09 DIAGNOSIS — Z79899 Other long term (current) drug therapy: Secondary | ICD-10-CM | POA: Diagnosis not present

## 2016-01-09 DIAGNOSIS — I10 Essential (primary) hypertension: Secondary | ICD-10-CM | POA: Diagnosis not present

## 2016-01-09 DIAGNOSIS — J910 Malignant pleural effusion: Secondary | ICD-10-CM | POA: Insufficient documentation

## 2016-01-09 DIAGNOSIS — D649 Anemia, unspecified: Secondary | ICD-10-CM | POA: Insufficient documentation

## 2016-01-09 DIAGNOSIS — C3491 Malignant neoplasm of unspecified part of right bronchus or lung: Secondary | ICD-10-CM | POA: Diagnosis not present

## 2016-01-09 DIAGNOSIS — C7951 Secondary malignant neoplasm of bone: Secondary | ICD-10-CM | POA: Diagnosis not present

## 2016-01-09 DIAGNOSIS — Z5111 Encounter for antineoplastic chemotherapy: Secondary | ICD-10-CM | POA: Insufficient documentation

## 2016-01-09 DIAGNOSIS — Z801 Family history of malignant neoplasm of trachea, bronchus and lung: Secondary | ICD-10-CM | POA: Insufficient documentation

## 2016-01-09 DIAGNOSIS — R970 Elevated carcinoembryonic antigen [CEA]: Secondary | ICD-10-CM | POA: Diagnosis not present

## 2016-01-09 DIAGNOSIS — Z9981 Dependence on supplemental oxygen: Secondary | ICD-10-CM | POA: Insufficient documentation

## 2016-01-09 LAB — BASIC METABOLIC PANEL
Anion gap: 6 (ref 5–15)
BUN: 16 mg/dL (ref 6–20)
CO2: 29 mmol/L (ref 22–32)
Calcium: 9.2 mg/dL (ref 8.9–10.3)
Chloride: 103 mmol/L (ref 101–111)
Creatinine, Ser: 1.17 mg/dL (ref 0.61–1.24)
GFR calc Af Amer: >60 mL/min (ref >60)
GFR calc non Af Amer: >60 mL/min (ref >60)
Glucose, Bld: 131 mg/dL — ABNORMAL HIGH (ref 65–99)
Potassium: 4.2 mmol/L (ref 3.5–5.1)
Sodium: 138 mmol/L (ref 135–145)

## 2016-01-09 MED ORDER — DENOSUMAB 120 MG/1.7ML ~~LOC~~ SOLN
120.0000 mg | Freq: Once | SUBCUTANEOUS | Status: AC
  Administered 2016-01-09: 120 mg via SUBCUTANEOUS
  Filled 2016-01-09: qty 1.7

## 2016-01-10 ENCOUNTER — Other Ambulatory Visit: Payer: Self-pay | Admitting: Hematology and Oncology

## 2016-01-10 DIAGNOSIS — C3491 Malignant neoplasm of unspecified part of right bronchus or lung: Secondary | ICD-10-CM

## 2016-01-10 LAB — CEA: CEA: 857.2 ng/mL — ABNORMAL HIGH (ref 0.0–4.7)

## 2016-01-12 ENCOUNTER — Other Ambulatory Visit: Payer: Self-pay | Admitting: Hematology and Oncology

## 2016-01-12 ENCOUNTER — Telehealth: Payer: Self-pay | Admitting: *Deleted

## 2016-01-12 NOTE — Telephone Encounter (Signed)
Called pt and let him know that his tumor markers that md monitors has been going up and she even had him recheck it to make sure what level it would be and the 3 levels we have cont. To be elevated and because of this she wants him to have a PET scan to see if cancer has went somewhere else. It may not be but when the tumor markers go up then it is a good idea to check with scan. He is agreeable and the scan is 10/12 and to [facility]. NPO 6 hours but drink as much water as you want. No gum or sugar for 8 hours. Low carb diet the night before and he is ok with it.  Also  I told him if he wanted his wife to call she is welcome to.

## 2016-01-13 ENCOUNTER — Telehealth: Payer: Self-pay | Admitting: *Deleted

## 2016-01-13 NOTE — Telephone Encounter (Signed)
Called requesting to speak with Judeen Hammans to explain why Mr Behrens needs a scan. And to ask about his labs he had drawn. Please return her call at [phone]

## 2016-01-13 NOTE — Telephone Encounter (Signed)
Called pt's house and tammy the pt's wife came on the phone and I told her the reason why corcoran wants the PET scan.  The tumor markers had came down to 251 and when it last done it was over 700.  When the levels go up like this we need to look if pt cancer is growing. The tumor markers are not a sure thing but when it goes up this is what corcoran would like done to make sure the status of his cancer. She understands and would like to proceed

## 2016-01-15 ENCOUNTER — Ambulatory Visit
Admission: RE | Admit: 2016-01-15 | Discharge: 2016-01-15 | Disposition: A | Discharge status: Home / Self Care | Payer: PRIVATE HEALTH INSURANCE | Source: Ambulatory Visit | Attending: Hematology and Oncology | Admitting: Hematology and Oncology

## 2016-01-15 DIAGNOSIS — C3491 Malignant neoplasm of unspecified part of right bronchus or lung: Secondary | ICD-10-CM | POA: Diagnosis not present

## 2016-01-15 DIAGNOSIS — C7951 Secondary malignant neoplasm of bone: Secondary | ICD-10-CM | POA: Diagnosis not present

## 2016-01-15 LAB — GLUCOSE, CAPILLARY: Glucose-Capillary: 113 mg/dL — ABNORMAL HIGH (ref 65–99)

## 2016-01-15 MED ORDER — FLUDEOXYGLUCOSE F - 18 (FDG) INJECTION
13.0600 | Freq: Once | INTRAVENOUS | Status: AC
  Administered 2016-01-15: 13.06 via INTRAVENOUS

## 2016-01-16 ENCOUNTER — Telehealth: Payer: Self-pay | Admitting: *Deleted

## 2016-01-16 NOTE — Telephone Encounter (Signed)
Asking for results of his PET Scan from yesterday. When checking computer, the results are not available and has a status of "exam begun". I called radiology and was told that a tech has to complete the exam before the Radiologist knows to read it and she was not able to do I would have to call a tech. I called PET dept and got VM and left a message regarding this and for a return call.

## 2016-01-17 ENCOUNTER — Other Ambulatory Visit: Payer: Self-pay | Admitting: Hematology and Oncology

## 2016-01-17 DIAGNOSIS — C3491 Malignant neoplasm of unspecified part of right bronchus or lung: Secondary | ICD-10-CM

## 2016-01-17 NOTE — Progress Notes (Signed)
START ON PATHWAY REGIMEN - Non-Small Cell Lung  [id number]: Carboplatin AUC=5 + Pemetrexed 500 mg/m2 + Bevacizumab 15 mg/kg q21 Days x 4 Cycles   A cycle is every 21 days:     Carboplatin (Paraplatin(R)) AUC=5 in 250 mL NS IV over 1 hour Dose Mod: None     Pemetrexed (Alimta(R)) 500 mg/m2 in 100 mL NS IV over 10 minutes, manufacturer recommends not administering to patients with CrCl < 45 mL/min Dose Mod: None     Bevacizumab (Avastin(R)) 15 mg/kg in 100 mL NS IV over 90 minutes first infusion, 60 minutes second infusion and 30 minutes all subsequent infusions if tolerated Dose Mod: None Additional Orders: * All AUC calculations intended to be used in [employer] formula Note: Patient to receive the following prior to the initiation of therapy: 1) Dexamethasone 4 mg orally twice daily x 6 doses.  First dose 24 hours before chemotherapy. 2) Folic acid >= 103 mcg orally daily.  First dose at least 5 days prior to the first dose of pemetrexed. 3) Vitamin B12 1,000 mcg intramuscularly every 9 weeks.  First dose at least 5 days prior to the first dose of pemetrexed.  **Always confirm dose/schedule in your pharmacy ordering system**    Patient Characteristics: Stage IV Metastatic, Non Squamous, Initial Chemotherapy/Immunotherapy, PS = 0, 1, PD-L1 Expression Positive 1-49% (TPS) / Negative / Not Tested / Not a Candidate for Immunotherapy AJCC M Stage: X AJCC N Stage: X AJCC T Stage: X Current Disease Status: Distant Metastases AJCC Stage Grouping: IV Histology: Non Squamous Cell ROS1 Rearrangement Status: Negative T790M Mutation Status: Not Applicable - EGFR Mutation Negative/Unknown Other Mutations/Biomarkers: No Other Actionable Mutations PD-L1 Expression Status: PD-L1 Negative Chemotherapy/Immunotherapy LOT: Initial Chemotherapy/Immunotherapy Molecular Targeted Therapy: Not Appropriate ALK Translocation Status: Negative Would you be surprised if this patient died  in the next year? I would be  surprised if this patient died in the next year EGFR Mutation Status: Negative/Wild Type BRAF [id number] Mutation Status: Did Not Order Test Performance Status: PS = 0, 1  Intent of Therapy: Non-Curative / Palliative Intent, Discussed with Patient

## 2016-01-17 NOTE — Telephone Encounter (Signed)
Re:  PET scan results  Discussed with patient results of PET scan results.  PET scan reveals progressive disease.  Discuss plan to return on 01/19/2016 for further discussion.  Discuss plan to restart carboplatin and Alimta +/- Avastin.  He previously had an excellent response to carboplatin and Avastin.  He has progressed on Keytruda.  He has continued his folic acid.  He will receive B12 on 01/19/2016.  Foundation Once testing will be sent.  Lequita Asal, MD

## 2016-01-19 ENCOUNTER — Inpatient Hospital Stay (HOSPITAL_BASED_OUTPATIENT_CLINIC_OR_DEPARTMENT_OTHER): Payer: PRIVATE HEALTH INSURANCE | Admitting: Hematology and Oncology

## 2016-01-19 ENCOUNTER — Ambulatory Visit: Payer: PRIVATE HEALTH INSURANCE

## 2016-01-19 ENCOUNTER — Encounter: Payer: Self-pay | Admitting: Hematology and Oncology

## 2016-01-19 VITALS — BP 122/89 | HR 104 | Temp 96.9°F | Resp 20 | Wt 248.7 lb

## 2016-01-19 DIAGNOSIS — Z9221 Personal history of antineoplastic chemotherapy: Secondary | ICD-10-CM

## 2016-01-19 DIAGNOSIS — C7951 Secondary malignant neoplasm of bone: Secondary | ICD-10-CM

## 2016-01-19 DIAGNOSIS — Z79899 Other long term (current) drug therapy: Secondary | ICD-10-CM

## 2016-01-19 DIAGNOSIS — Z801 Family history of malignant neoplasm of trachea, bronchus and lung: Secondary | ICD-10-CM

## 2016-01-19 DIAGNOSIS — M109 Gout, unspecified: Secondary | ICD-10-CM

## 2016-01-19 DIAGNOSIS — C3491 Malignant neoplasm of unspecified part of right bronchus or lung: Secondary | ICD-10-CM | POA: Diagnosis not present

## 2016-01-19 DIAGNOSIS — J910 Malignant pleural effusion: Secondary | ICD-10-CM

## 2016-01-19 DIAGNOSIS — Z9981 Dependence on supplemental oxygen: Secondary | ICD-10-CM

## 2016-01-19 DIAGNOSIS — Z5111 Encounter for antineoplastic chemotherapy: Secondary | ICD-10-CM | POA: Diagnosis not present

## 2016-01-19 DIAGNOSIS — R970 Elevated carcinoembryonic antigen [CEA]: Secondary | ICD-10-CM

## 2016-01-19 DIAGNOSIS — D649 Anemia, unspecified: Secondary | ICD-10-CM

## 2016-01-19 DIAGNOSIS — I10 Essential (primary) hypertension: Secondary | ICD-10-CM

## 2016-01-19 MED ORDER — CYANOCOBALAMIN 1000 MCG/ML IJ SOLN
1000.0000 ug | Freq: Once | INTRAMUSCULAR | Status: AC
  Administered 2016-01-19: 1000 ug via INTRAMUSCULAR
  Filled 2016-01-19: qty 1

## 2016-01-19 NOTE — Progress Notes (Signed)
[facility] day:  01/19/2016   Chief Complaint: Stuart Spence is a 55 y.o. male with metastatic lung cancer who is seen for review of interval PET scan and discussion regarding direction of therapy.  HPI:  The patient was last seen in the medical oncology clinic on 01/02/2016.  At that time, he was doing well.  He received cycle #6 Keytruda.  Labs on the day of his chemotherapy later returned with a significant increase in his CEA (261.3 to 726.2).  CEA was repeated on 01/09/2016 and returned 857.2.  He was scheduled for follow-up PET scan.  PET scan on 01/16/2016 revealed a marked interval progression of disease compared with previous exam.  There was progression of multifocal hypermetabolic bone metastases.  There was the interval enlargement and right lung perihilar mass with development of pleural spread of tumor and lymphangitic spread of tumor within the right lung.  There was new hypermetabolic and enlarged sub-carinal lymph node.  He received Xgeva on 01/09/2016.  Symptomatically, he feels fine.  He denies any complaint.   Past Medical History:  Diagnosis Date  . Cancer ([place])    Lung  . Depression   . ED (erectile dysfunction)   . Gout   . Hypertension   . On home oxygen therapy   . Personal history of chemotherapy    PT TAKING CHEMO EVERY 3 WEEKS    Past Surgical History:  Procedure Laterality Date  . CHEST TUBE INSERTION Left 08/13/2015   Procedure: CHEST TUBE INSERTION;  Surgeon: Nestor Lewandowsky, MD;  Location: [facility] ORS;  Service: General;  Laterality: Left;  . CHEST TUBE INSERTION N/A 09/18/2015   Procedure: PLEURX CATH REMOVAL;  Surgeon: Nestor Lewandowsky, MD;  Location: [facility] ORS;  Service: Thoracic;  Laterality: N/A;  . PORTACATH PLACEMENT Left 08/13/2015   Procedure: INSERTION PORT-A-CATH;  Surgeon: Nestor Lewandowsky, MD;  Location: [facility] ORS;  Service: General;  Laterality: Left;  . PORTACATH PLACEMENT    . TONSILLECTOMY      Family  History  Problem Relation Age of Onset  . Cancer Mother 62    lung  . Heart attack Father 58  . Hypertension Father   . Congestive Heart Failure Father 1    died from  . Diabetes Sister     Social History:  reports that he has never smoked. He has never used smokeless tobacco. He reports that he does not drink alcohol or use drugs.  He has worked 50 years in the [employer].He notes exposure to chemicals.   He recently switched jobs.  He is not working.  He had previously planned on helping to build 2 houses.  Patient's wife name is [name].  He is alone today.  Allergies: No Known Allergies  Current Medications: Current Outpatient Prescriptions  Medication Sig Dispense Refill  . albuterol (PROVENTIL HFA;VENTOLIN HFA) 108 (90 Base) MCG/ACT inhaler Inhale 1-2 puffs into the lungs See admin instructions. Reported on 09/12/2015    . amLODipine (NORVASC) 5 MG tablet Take 1 tablet (5 mg total) by mouth daily. 30 tablet 6  . Calcium Carbonate (CALCIUM 600 PO) Take 1,200 mg by mouth 2 (two) times daily.    . colchicine 0.6 MG tablet Take 0.6 mg by mouth daily as needed (two pills by mouth at onset, the one pill one hour later, maximum three pills per gout flare).    Marland Kitchen dexamethasone (DECADRON) 4 MG tablet 4 mg tablet twice a day the day before chemo, and  the day after chemo with each regimen of carbo/alimta 36 tablet 0  . FLUoxetine (PROZAC) 20 MG capsule Take 2 capsules (40 mg total) by mouth daily. (Patient taking differently: Take 40 mg by mouth every morning. ) 60 capsule 6  . folic acid (FOLVITE) 1 MG tablet Take 1 tablet (1 mg total) by mouth daily. 30 tablet 3  . HYDROcodone-acetaminophen (NORCO/VICODIN) 5-325 MG tablet Take 1 tablet by mouth every 6 (six) hours. Reported on 09/18/2015    . lidocaine-prilocaine (EMLA) cream Apply 1 application topically as needed. 30 g 2  . ondansetron (ZOFRAN) 8 MG tablet Take 1 tablet (8 mg total) by mouth every 8 (eight) hours as needed for nausea or  vomiting. 20 tablet 3  . OXYGEN Inhale 2 L into the lungs continuous. Reported on 09/12/2015    . sildenafil (REVATIO) 20 MG tablet Take 1 tablet (20 mg total) by mouth daily as needed. 50 tablet 12  . sildenafil (VIAGRA) 100 MG tablet Take 1 tablet (100 mg total) by mouth daily as needed for erectile dysfunction. 10 tablet 12  . telmisartan (MICARDIS) 40 MG tablet Take 1 tablet (40 mg total) by mouth daily. 30 tablet 6   No current facility-administered medications for this visit.     Review of Systems:  GENERAL: Feels good. No fevers or sweats.  Active. Weight up 3 pounds. PERFORMANCE STATUS (ECOG): 1 HEENT: No visual changes, runny nose, sore throat, mouth sores or tenderness. Lungs: No shortness of breath. No cough. No hemoptysis.   Cardiac: No chest pain, palpitations, orthopnea, or PND. GI: No nausea, vomiting, diarrhea, constipation, melena or hematochezia. No prior colonoscopy. GU: No urgency, frequency, dysuria, or hematuria.  Musculoskeletal: No back pain. No joint pain. No muscle tenderness. Extremities: No pain or swelling. Skin: No rashes or skin changes. Neuro: No headache, numbness or weakness, balance or coordination issues. Endocrine: No diabetes, thyroid issues, hot flashes or night sweats. Psych: No mood changes, depression or anxiety. Pain: No focal pain. Review of systems: All other systems reviewed and found to be negative.  Physical Exam: Blood pressure 122/89, pulse (!) 104, temperature (!) 96.9 F (36.1 C), temperature source Tympanic, resp. rate 20, weight 248 lb 10.9 oz (112.8 kg), SpO2 93 %. GENERAL: Well developed, well nourished, heavyset gentleman sitting in the exam room in no acute distress. MENTAL STATUS: Alert and oriented to person, place and time. HEAD: Short dark hair. Goatee. Normocephalic, atraumatic, face symmetric, no Cushingoid features. EYES: Oval glasses. Pupils equal round and reactive to light and accomodation.  No conjunctivitis or scleral icterus. ENT: Oropharynx clear without lesion. Tongue normal. Mucous membranes moist.  RESPIRATORY: Subtle decreased breath sounds right base. No wheezes or rhonchi. CARDIOVASCULAR: Regular rate and rhythm without murmur, rub or gallop. ABDOMEN: Soft, non-tender, with active bowel sounds, and no hepatosplenomegaly. No masses. SKIN: No rashes, ulcers or lesions. EXTREMITIES: Mild chronic lower extremity changes.  No skin discoloration or tenderness. No palpable cords. LYMPH NODES: No palpable cervical, supraclavicular, axillary or inguinal adenopathy  NEUROLOGICAL: Unremarkable. PSYCH: Appropriate.   No visits with results within 3 Day(s) from this visit.  Latest known visit with results is:  Hospital Outpatient Visit on 01/15/2016  Component Date Value Ref Range Status  . Glucose-Capillary 01/15/2016 113* 65 - 99 mg/dL Final    Assessment:  Kelton Bultman is a 55 y.o. male with stage IV adenocarcinoma of the right lung.  He has no smoking history.  He presented with with a large right sided pleural effusion  and an ill-defined 4.5 x 4.5 cm mass in the right upper lobe. Thoracentesis x 2 of approximately 4.7 liters of blood fluid revealed adenocarcinoma. TTF-1 and Napsin A were immunoreactive and consistent with a lung primary. Pleur-X catheter was placed on 08/13/2015 (removed 09/18/2015).  [facility] testing on 08/18/2015 was negative for:  EGFR, ALK, ROS1 and RET.  Chest CT angiogram on 08/10/2015 revealed and ill-defined 4.5 x 4.5 cm area of hypodensity in the right upper lobe with small areas of air bronchogram. This was incompletely characterized but is concerning for a centrally obstructing mass. There was complete occlusion of the right upper, right middle, and right lower lobe bronchi with complete collapse of the right lung. There was a large right pleural effusion. There was a lytic lesion involving the right fourth rib compatible with  metastatic disease. There was no CT evidence of pulmonary embolism.  PET scan on 08/20/2015 revealed a hypermetabolic 5.2 cm medial right upper lobe lung mass, consistent with primary bronchogenic carcinoma, with a broad attachment to the medial right upper lobe pleura.  There was interlobular septal thickening throughout the right lung suggested a component of lymphangitic tumor.  There was a malignant small right pleural effusion with hypermetabolism throughout the right pleural space.  There was hypermetabolic ipsilateral hilar, subcarinal and ipsilateral mediastinal nodal metastases.  There was extensive hypermetabolic lytic osseous metastases throughout the axial and proximal appendicular skeleton.    Regarding the skeleton, there were numerous hypermetabolic faintly lytic osseous metastases in the left humeral head (SUV 6.8), right acromion, bilateral ribs most prominent in the anterior right fourth rib  (SUV 16.0), thoracolumbar spine (T11 vertebral body with SUV 9.4), bilateral iliac bones (medial right iliac bone with SUV 12.1 and left anterior acetabulum with SUV 10.0), and right proximal femur (SUV 10.0 in the region of the right lesser trochanter).   Plain films on 08/21/2015 revealed a left humerus lytic lesion with some thinning and some absence of the cortical margin.  He received 800 cGy to the left humerus on 09/17/2015.  CEA was 837.1 on 09/12/2015, 448 on 10/10/2015, 346 on 10/31/2015, 261.3 on 11/21/2015, 726.2 on 01/02/2016, and 857.2 on 01/09/2016.  Anemia work-up on 09/04/2015 revealed a ferritin (519), iron saturation (18%), B12 (3372), and folate (21.9).  He received 4 cycles of Keytruda, carboplatin and Alimta (08/22/2015 - 10/31/2015).   He subsequently has received single agent Keytruda (11/21/2015 - 01/02/2016).  Pleur-X catheter was removed on 09/18/2015.  He receives monthly Xgeva (began 08/22/2015; last 01/09/2016).   PET scan on 11/20/2015 revealed a marked positive  response to therapy.  There was decrease in size and metabolic activity of RIGHT perihilar mass, resolution of mediastinal nodal metabolic activity, and marked decrease in size and metabolic activity of RIGHT anterior chest wall metastasis.  There was decreased in metabolic activity of multiple skeletal metastasis.    PET scan on 01/16/2016 revealed a marked interval progression of disease compared with previous exam.  There was progression of multifocal hypermetabolic bone metastases.  There was the interval enlargement and right lung perihilar mass with development of pleural spread of tumor and lymphangitic spread of tumor within the right lung.  There was new hypermetabolic and enlarged sub-carinal lymph node.  Symptomatically, he feels good.  Exam reveals subtle decreased breath sounds in the right base.  Plan: 1.  Review interval labs results (rising CEA) and progressive disease noted on PET scan.  Patient has been on single agent Keytruda.  Discussed initial response to carboplatin and Alimta +  Keytruda.  Discuss return to carboplatin and Alimta +/- Avastin.  Discuss B12 injection today and folic acid (patient has continued).  Discuss steroids the day before and the day after treatment.  Discuss risks and potential benefits of the addition of Avastin.  Avastin has been shown to increase the response rate (RR), progression free survival (PFS), and overall survival (OS) compared to chemotherapy alone.  In a meta-analysis of 4 trials (AVF-0757g, [id number], ECOG 4599 and AVAiL) with over 2000 patients published in Annals of Oncology (2013), Avastin prolonged OS (HR 0.90) and PFS (0.72).  However, there was increased risk of grade 3 toxicity.  Grade 3 or higher adverse events included thromboembolism 8%, hypertension 6%, bleeding 4%, proteinuria 3%, and pulmonary hemorrhage 1%. Treatment-related mortality occurred in 3%.   If patient responds well to carboplatin + Alimta +/- Avastin, discuss maintenance  Alimta +/- Avastin.  In the AVAPERL study, patients with nonsquamous NSCLC who had achieved disease control with platinum-based chemotherapy + Avastin, Avastin plus Alimta maintenance was associated with a significant PFS benefit compared with Avastin alone (JCO 2013).  At a median follow-up of 8 months, PFS was significantly improved in the Avastin + Alimta arm (3.7 months versus 7.4 months).  Information on Avastin today.  Discuss sending off tumor for Foundation One testing for potential other treatment options.  2.  B12 today 3.  Review films with radiology re: potential biopsy site if original tissue inadequate sample for Foundation One testing- done.  Only option is additional pleural fluid.  Per pathology, enough cells from initial cytology for testing. 4.  Preauth carboplatin, Alimta, Avastin. 5.  RTC on 01/23/2016 for MD assessment (covering provider), labs (CBC with diff, CMP, Mg, CEA), and carboplatin, Alimta +/- Avastin.   Lequita Asal, MD  01/19/2016, 8:36 AM

## 2016-01-19 NOTE — Telephone Encounter (Signed)
Pt dx was on pleural fluid and foundation one can't be done on fluid, [facility] notified

## 2016-01-19 NOTE — Progress Notes (Signed)
Pt here to go over in detail his pet results and the treatment he will start this coming Friday.  He is eating well, drinking well. Bowels and bladder without any problems.  He has started a cough and then he coughs up clear or white phlegm.  No fever today. No pain today. No breathing issues and he has not been wearing any oxygen in several months.

## 2016-01-19 NOTE — Patient Instructions (Signed)
Bevacizumab injection What is this medicine? BEVACIZUMAB (be va SIZ yoo mab) is a monoclonal antibody. It is used to treat cervical cancer, colorectal cancer, glioblastoma multiforme, non-small cell lung cancer (NSCLC), ovarian cancer, and renal cell cancer. This medicine may be used for other purposes; ask your health care provider or pharmacist if you have questions. What should I tell my health care provider before I take this medicine? They need to know if you have any of these conditions: -blood clots -heart disease, including heart failure, heart attack, or chest pain (angina) -high blood pressure -infection (especially a virus infection such as chickenpox, cold sores, or herpes) -kidney disease -lung disease -prior chemotherapy with doxorubicin, daunorubicin, epirubicin, or other anthracycline type chemotherapy agents -recent or ongoing radiation therapy -recent surgery -stroke -an unusual or allergic reaction to bevacizumab, hamster proteins, mouse proteins, other medicines, foods, dyes, or preservatives -pregnant or trying to get pregnant -breast-feeding How should I use this medicine? This medicine is for infusion into a vein. It is given by a health care professional in a hospital or clinic setting. Talk to your pediatrician regarding the use of this medicine in children. Special care may be needed. Overdosage: If you think you have taken too much of this medicine contact a poison control center or emergency room at once. NOTE: This medicine is only for you. Do not share this medicine with others. What if I miss a dose? It is important not to miss your dose. Call your doctor or health care professional if you are unable to keep an appointment. What may interact with this medicine? Interactions are not expected. This list may not describe all possible interactions. Give your health care provider a list of all the medicines, herbs, non-prescription drugs, or dietary supplements  you use. Also tell them if you smoke, drink alcohol, or use illegal drugs. Some items may interact with your medicine. What should I watch for while using this medicine? Your condition will be monitored carefully while you are receiving this medicine. You will need important blood work and urine testing done while you are taking this medicine. During your treatment, let your health care professional know if you have any unusual symptoms, such as difficulty breathing. This medicine may rarely cause 'gastrointestinal perforation' (holes in the stomach, intestines or colon), a serious side effect requiring surgery to repair. This medicine should be started at least 28 days following major surgery and the site of the surgery should be totally healed. Check with your doctor before scheduling dental work or surgery while you are receiving this treatment. Talk to your doctor if you have recently had surgery or if you have a wound that has not healed. Do not become pregnant while taking this medicine or for 6 months after stopping it. Women should inform their doctor if they wish to become pregnant or think they might be pregnant. There is a potential for serious side effects to an unborn child. Talk to your health care professional or pharmacist for more information. Do not breast-feed an infant while taking this medicine. This medicine has caused ovarian failure in some women. This medicine may interfere with the ability to have a child. You should talk to your doctor or health care professional if you are concerned about your fertility. What side effects may I notice from receiving this medicine? Side effects that you should report to your doctor or health care professional as soon as possible: -allergic reactions like skin rash, itching or hives, swelling of the face,  lips, or tongue -signs of infection - fever or chills, cough, sore throat, pain or trouble passing urine -signs of decreased platelets or  bleeding - bruising, pinpoint red spots on the skin, black, tarry stools, nosebleeds, blood in the urine -breathing problems -changes in vision -chest pain -confusion -jaw pain, especially after dental work -mouth sores -seizures -severe abdominal pain -severe headache -sudden numbness or weakness of the face, arm or leg -swelling of legs or ankles -symptoms of a stroke: change in mental awareness, inability to talk or move one side of the body (especially in patients with lung cancer) -trouble passing urine or change in the amount of urine -trouble speaking or understanding -trouble walking, dizziness, loss of balance or coordination Side effects that usually do not require medical attention (report to your doctor or health care professional if they continue or are bothersome): -constipation -diarrhea -dry skin -headache -loss of appetite -nausea, vomiting This list may not describe all possible side effects. Call your doctor for medical advice about side effects. You may report side effects to FDA at 1-800-FDA-1088. Where should I keep my medicine? This drug is given in a hospital or clinic and will not be stored at home. NOTE: This sheet is a summary. It may not cover all possible information. If you have questions about this medicine, talk to your doctor, pharmacist, or health care provider.    2016, [employer] Standard. (2014-05-21 16:58:44)

## 2016-01-21 NOTE — Telephone Encounter (Signed)
  I spoke to [employer].  I am sure she said she had enough to send for Foundation One.  M

## 2016-01-23 ENCOUNTER — Inpatient Hospital Stay: Payer: PRIVATE HEALTH INSURANCE

## 2016-01-23 ENCOUNTER — Inpatient Hospital Stay (HOSPITAL_BASED_OUTPATIENT_CLINIC_OR_DEPARTMENT_OTHER): Payer: PRIVATE HEALTH INSURANCE | Admitting: Internal Medicine

## 2016-01-23 ENCOUNTER — Other Ambulatory Visit: Payer: Self-pay | Admitting: *Deleted

## 2016-01-23 ENCOUNTER — Encounter: Payer: Self-pay | Admitting: Hematology and Oncology

## 2016-01-23 ENCOUNTER — Other Ambulatory Visit: Payer: Self-pay | Admitting: Hematology and Oncology

## 2016-01-23 DIAGNOSIS — Z9981 Dependence on supplemental oxygen: Secondary | ICD-10-CM

## 2016-01-23 DIAGNOSIS — C3491 Malignant neoplasm of unspecified part of right bronchus or lung: Secondary | ICD-10-CM | POA: Diagnosis not present

## 2016-01-23 DIAGNOSIS — M109 Gout, unspecified: Secondary | ICD-10-CM

## 2016-01-23 DIAGNOSIS — R970 Elevated carcinoembryonic antigen [CEA]: Secondary | ICD-10-CM

## 2016-01-23 DIAGNOSIS — Z9221 Personal history of antineoplastic chemotherapy: Secondary | ICD-10-CM

## 2016-01-23 DIAGNOSIS — C7951 Secondary malignant neoplasm of bone: Secondary | ICD-10-CM

## 2016-01-23 DIAGNOSIS — Z801 Family history of malignant neoplasm of trachea, bronchus and lung: Secondary | ICD-10-CM

## 2016-01-23 DIAGNOSIS — Z79899 Other long term (current) drug therapy: Secondary | ICD-10-CM

## 2016-01-23 DIAGNOSIS — J910 Malignant pleural effusion: Secondary | ICD-10-CM

## 2016-01-23 DIAGNOSIS — I10 Essential (primary) hypertension: Secondary | ICD-10-CM

## 2016-01-23 DIAGNOSIS — C3490 Malignant neoplasm of unspecified part of unspecified bronchus or lung: Secondary | ICD-10-CM

## 2016-01-23 DIAGNOSIS — Z5111 Encounter for antineoplastic chemotherapy: Secondary | ICD-10-CM | POA: Diagnosis not present

## 2016-01-23 DIAGNOSIS — D649 Anemia, unspecified: Secondary | ICD-10-CM

## 2016-01-23 LAB — CBC WITH DIFFERENTIAL/PLATELET
Basophils Absolute: 0 10*3/uL (ref 0–0.1)
Basophils Relative: 0 %
Eosinophils Absolute: 0 10*3/uL (ref 0–0.7)
Eosinophils Relative: 0 %
HCT: 42.1 % (ref 40.0–52.0)
Hemoglobin: 14.5 g/dL (ref 13.0–18.0)
Lymphocytes Relative: 7 %
Lymphs Abs: 0.9 10*3/uL — ABNORMAL LOW (ref 1.0–3.6)
MCH: 31.6 pg (ref 26.0–34.0)
MCHC: 34.4 g/dL (ref 32.0–36.0)
MCV: 92 fL (ref 80.0–100.0)
Monocytes Absolute: 0.5 10*3/uL (ref 0.2–1.0)
Monocytes Relative: 3 %
Neutro Abs: 12.9 10*3/uL — ABNORMAL HIGH (ref 1.4–6.5)
Neutrophils Relative %: 90 %
Platelets: 299 10*3/uL (ref 150–440)
RBC: 4.58 MIL/uL (ref 4.40–5.90)
RDW: 14 % (ref 11.5–14.5)
WBC: 14.3 10*3/uL — ABNORMAL HIGH (ref 3.8–10.6)

## 2016-01-23 LAB — COMPREHENSIVE METABOLIC PANEL
ALT: 12 U/L — ABNORMAL LOW (ref 17–63)
AST: 17 U/L (ref 15–41)
Albumin: 3.8 g/dL (ref 3.5–5.0)
Alkaline Phosphatase: 125 U/L (ref 38–126)
Anion gap: 10 (ref 5–15)
BUN: 25 mg/dL — ABNORMAL HIGH (ref 6–20)
CO2: 26 mmol/L (ref 22–32)
Calcium: 9.3 mg/dL (ref 8.9–10.3)
Chloride: 99 mmol/L — ABNORMAL LOW (ref 101–111)
Creatinine, Ser: 1.09 mg/dL (ref 0.61–1.24)
GFR calc Af Amer: >60 mL/min (ref >60)
GFR calc non Af Amer: >60 mL/min (ref >60)
Glucose, Bld: 167 mg/dL — ABNORMAL HIGH (ref 65–99)
Potassium: 4.4 mmol/L (ref 3.5–5.1)
Sodium: 135 mmol/L (ref 135–145)
Total Bilirubin: 0.6 mg/dL (ref 0.3–1.2)
Total Protein: 8 g/dL (ref 6.5–8.1)

## 2016-01-23 LAB — TSH: TSH: 0.942 u[IU]/mL (ref 0.350–4.500)

## 2016-01-23 MED ORDER — PALONOSETRON HCL INJECTION 0.25 MG/5ML
0.2500 mg | Freq: Once | INTRAVENOUS | Status: AC
  Administered 2016-01-23: 0.25 mg via INTRAVENOUS
  Filled 2016-01-23: qty 5

## 2016-01-23 MED ORDER — DEXAMETHASONE SODIUM PHOSPHATE 10 MG/ML IJ SOLN
10.0000 mg | Freq: Once | INTRAMUSCULAR | Status: AC
  Administered 2016-01-23: 10 mg via INTRAVENOUS
  Filled 2016-01-23: qty 1

## 2016-01-23 MED ORDER — HEPARIN SOD (PORK) LOCK FLUSH 100 UNIT/ML IV SOLN
500.0000 [IU] | Freq: Once | INTRAVENOUS | Status: AC | PRN
  Administered 2016-01-23: 500 [IU]
  Filled 2016-01-23: qty 5

## 2016-01-23 MED ORDER — SODIUM CHLORIDE 0.9 % IV SOLN
1100.0000 mg | Freq: Once | INTRAVENOUS | Status: AC
  Administered 2016-01-23: 1100 mg via INTRAVENOUS
  Filled 2016-01-23: qty 40

## 2016-01-23 MED ORDER — SODIUM CHLORIDE 0.9 % IV SOLN: 10.0000 mg | Freq: Once | INTRAVENOUS | Status: DC

## 2016-01-23 MED ORDER — SODIUM CHLORIDE 0.9 % IV SOLN
Freq: Once | INTRAVENOUS | Status: AC
  Administered 2016-01-23: 10:00:00 via INTRAVENOUS
  Filled 2016-01-23: qty 1000

## 2016-01-23 MED ORDER — SODIUM CHLORIDE 0.9 % IV SOLN
735.0000 mg | Freq: Once | INTRAVENOUS | Status: AC
  Administered 2016-01-23: 740 mg via INTRAVENOUS
  Filled 2016-01-23: qty 74

## 2016-01-23 NOTE — Progress Notes (Signed)
[place]  [phone] [phone]  ID: Danise Mina OB: 07-20-1960  MR#: [id number]  OHY#:[id number]  Patient Care Team: Guadalupe Maple, MD as PCP - General (Family Medicine) Nestor Lewandowsky, MD as Referring Physician (Cardiothoracic Surgery) Laverle Hobby, MD as Consulting Physician (Pulmonary Disease)  CHIEF COMPLAINT: Adenocarcinoma of right lung    INTERVAL HISTORY: Mr. Winski is here to be assessed for cycle 1 Alimta/Carboplatin chemo today for stage IV adenocarcinoma lung  He had been feelign well, complains of no cough/hemoptysis, or fever or mouth sore so diarrhea He notes no nausea or vomiting He took decadron yesterday and began B12 last week He denies any any peripheral paresthesias or heaths, blurred vision   REVIEW OF SYSTEMS:   ROS  As per HPI. Otherwise, a complete review of systems is negative.  PAST MEDICAL HISTORY: Past Medical History:  Diagnosis Date  . Cancer ([place])    Lung  . Depression   . ED (erectile dysfunction)   . Gout   . Hypertension   . On home oxygen therapy   . Personal history of chemotherapy    PT TAKING CHEMO EVERY 3 WEEKS    PAST SURGICAL HISTORY: Past Surgical History:  Procedure Laterality Date  . CHEST TUBE INSERTION Left 08/13/2015   Procedure: CHEST TUBE INSERTION;  Surgeon: Nestor Lewandowsky, MD;  Location: [facility] ORS;  Service: General;  Laterality: Left;  . CHEST TUBE INSERTION N/A 09/18/2015   Procedure: PLEURX CATH REMOVAL;  Surgeon: Nestor Lewandowsky, MD;  Location: [facility] ORS;  Service: Thoracic;  Laterality: N/A;  . PORTACATH PLACEMENT Left 08/13/2015   Procedure: INSERTION PORT-A-CATH;  Surgeon: Nestor Lewandowsky, MD;  Location: [facility] OR[facility];  Service: General;  Laterality: Left;  . PORTACATH PLACEMENT    . TONSILLECTOMY      FAMILY HISTORY: Family History  Problem Relation Age of Onset  . Cancer Mother 71    lung  . Heart attack Father 45  . Hypertension Father   . Congestive Heart  Failure Father 31    died from  . Diabetes Sister     ADVANCED DIRECTIVES (Y/N):  N  HEALTH MAINTENANCE: Social History  Substance Use Topics  . Smoking status: Never Smoker  . Smokeless tobacco: Never Used  . Alcohol use No     Comment: gave it up in August 2016     Colonoscopy:  PAP:  Bone density:  Lipid panel:  No Known Allergies  Current Outpatient Prescriptions  Medication Sig Dispense Refill  . albuterol (PROVENTIL HFA;VENTOLIN HFA) 108 (90 Base) MCG/ACT inhaler Inhale 1-2 puffs into the lungs See admin instructions. Reported on 09/12/2015    . amLODipine (NORVASC) 5 MG tablet Take 1 tablet (5 mg total) by mouth daily. 30 tablet 6  . Calcium Carbonate (CALCIUM 600 PO) Take 1,200 mg by mouth 2 (two) times daily.    . colchicine 0.6 MG tablet Take 0.6 mg by mouth daily as needed (two pills by mouth at onset, the one pill one hour later, maximum three pills per gout flare).    Marland Kitchen dexamethasone (DECADRON) 4 MG tablet 4 mg tablet twice a day the day before chemo, and the day after chemo with each regimen of carbo/alimta 36 tablet 0  . FLUoxetine (PROZAC) 20 MG capsule Take 2 capsules (40 mg total) by mouth daily. (Patient taking differently: Take 40 mg by mouth every morning. ) 60 capsule 6  . folic acid (FOLVITE) 1 MG tablet Take 1 tablet (1 mg total) by mouth  daily. 30 tablet 3  . HYDROcodone-acetaminophen (NORCO/VICODIN) 5-325 MG tablet Take 1 tablet by mouth every 6 (six) hours. Reported on 09/18/2015    . lidocaine-prilocaine (EMLA) cream Apply 1 application topically as needed. 30 g 2  . ondansetron (ZOFRAN) 8 MG tablet Take 1 tablet (8 mg total) by mouth every 8 (eight) hours as needed for nausea or vomiting. 20 tablet 3  . OXYGEN Inhale 2 L into the lungs continuous. Reported on 09/12/2015    . sildenafil (REVATIO) 20 MG tablet Take 1 tablet (20 mg total) by mouth daily as needed. 50 tablet 12  . sildenafil (VIAGRA) 100 MG tablet Take 1 tablet (100 mg total) by mouth daily as  needed for erectile dysfunction. 10 tablet 12  . telmisartan (MICARDIS) 40 MG tablet Take 1 tablet (40 mg total) by mouth daily. 30 tablet 6   No current facility-administered medications for this visit.     OBJECTIVE: Vitals:   01/23/16 0822  BP: 126/82  Pulse: 97  Resp: 18  Temp: 97.2 F (36.2 C)     Body mass index is 40.59 kg/m.   2.25 meters squared  ECOG FS:0 - Asymptomatic  Physical Exam  Constitutional: He is oriented to person, place, and time. He appears well-developed and well-nourished.  HENT:  Head: Normocephalic and atraumatic.  Eyes: EOM are normal. Pupils are equal, round, and reactive to light.  Neck: Normal range of motion. Neck supple.  Cardiovascular: Normal rate and regular rhythm.   Pulmonary/Chest: Effort normal and breath sounds normal.  Abdominal: Soft. Bowel sounds are normal.  Musculoskeletal: Normal range of motion. He exhibits no edema.  Lymphadenopathy:    He has no cervical adenopathy.  Neurological: He is alert and oriented to person, place, and time.  Psychiatric: He has a normal mood and affect. Judgment and thought content normal.     LAB RESULTS:  Lab Results  Component Value Date   NA 135 01/23/2016   K 4.4 01/23/2016   CL 99 (L) 01/23/2016   CO2 26 01/23/2016   GLUCOSE 167 (H) 01/23/2016   BUN 25 (H) 01/23/2016   CREATININE 1.09 01/23/2016   CALCIUM 9.3 01/23/2016   PROT 8.0 01/23/2016   ALBUMIN 3.8 01/23/2016   AST 17 01/23/2016   ALT 12 (L) 01/23/2016   ALKPHOS 125 01/23/2016   BILITOT 0.6 01/23/2016   GFRNONAA >60 01/23/2016   GFRAA >60 01/23/2016    Lab Results  Component Value Date   WBC 14.3 (H) 01/23/2016   NEUTROABS 12.9 (H) 01/23/2016   HGB 14.5 01/23/2016   HCT 42.1 01/23/2016   MCV 92.0 01/23/2016   PLT 299 01/23/2016     STUDIES: Nm Pet Image Restag (ps) Skull Base To Thigh  Result Date: 01/16/2016 CLINICAL DATA:  Subsequent treatment strategy for lung cancer. EXAM: NUCLEAR MEDICINE PET SKULL  BASE TO THIGH TECHNIQUE: 13.06 mCi F-18 FDG was injected intravenously. Full-ring PET imaging was performed from the skull base to thigh after the radiotracer. CT data was obtained and used for attenuation correction and anatomic localization. FASTING BLOOD GLUCOSE:  Value: 113 mg/dl COMPARISON:  11/20/15 FINDINGS: NECK No hypermetabolic lymph nodes in the neck. CHEST New hypermetabolic subcarinal lymph node is identified. This measures 1.2 cm and has an SUV max equal to 15.7. This is new from previous exam. The right lung perihilar mass measures 4.0 x 2.9 cm and has an SUV max equal to 10.9. On the previous exam this lesion measure 2.9 x 2.3 cm and has  an SUV max equal to 5.3. New right pleural effusion. There is hypermetabolic soft tissue thickening overlying the right lung is compatible with pleural spread of tumor. Additionally, there is new irregular and nodular interlobular septal thickening compatible with lymphangitic spread of tumor. ABDOMEN/PELVIS No abnormal hypermetabolic activity within the liver, pancreas, adrenal glands, or spleen. No hypermetabolic lymph nodes in the abdomen or pelvis. SKELETON Interval recurrence of abnormal hypermetabolism associated with multifocal hypermetabolic bone metastases. Index lesion involving the posterior left acetabulum has an SUV max equal to 8.5. On the previous exam the SUV max was equal to 6.42. Hypermetabolic lesion within the right sacrum has an SUV max equal to 10.67. New from previous exam. Hypermetabolic tumor involving the T11 vertebra has an SUV max equal to 9.43. New from previous exam. Previous metastasis involving the anterior aspect of the right fourth rib demonstrates progressive hyper metabolism. SUV max is currently 9.72. This is compared with 4.4 previously. IMPRESSION: 1. Today's study demonstrates a marked interval progression of disease compared with previous exam. 2. Progression of multifocal hypermetabolic bone metastases. 3. Interval enlargement  and right lung perihilar mass with development of pleural spread of tumor and lymphangitic spread of tumor within the right lung. 4. New hypermetabolic and enlarged sub- carinal lymph node. Electronically Signed   By: Kerby Moors M.D.   On: 01/16/2016 15:32    ASSESSMENT:   Adenocarcinoma, lung ([place]) Metastatic adenocarcinoma lung progressed on Keytruda. He had previously responded to Carbo+ Alimta and therefore plan was made to return to Carbo/Alimta with Avastin . Today would be his cycle 1  He is stable to proceed with treatment today a planned Carbo dose calculated per current creatine Reminded to take Decadron and B12 and folate as ordered He willet next week for fe/. If he does well, plan as per Dr. Mike Gip is to add Avastin.to his regimen.  PET CT from 01/16/2016 was reviewed with him at the request of his sister. That showed progressive disease in the form of increase in the previously known mass but also new lesions in the sub carina.  He will also continue Xgeva for bone metastasis as scheduled     PLAN:    Patient expressed understanding and was in agreement with this plan. He also understands that He can call clinic at any time with any questions, concerns, or complaints.  No orders of the defined types were placed in this encounter.   Return in about 1 week (around 01/30/2016). Adenocarcinoma, lung ([place])   Staging form: Lung, AJCC 7th Edition   - Clinical stage from 08/20/2015: Stage IV (T3, N2, M1b) - Signed by Lequita Asal, MD on 08/21/2015   - Pathologic: No stage assigned - Michaela Corner, MD   01/23/2016 12:58 PM

## 2016-01-23 NOTE — Telephone Encounter (Signed)
I have filled out paper and have papers to fax, will need corcoran signature

## 2016-01-23 NOTE — Progress Notes (Signed)
Patient is here for follow up, he is with his sister. No questions at this time.

## 2016-01-23 NOTE — Assessment & Plan Note (Signed)
Metastatic adenocarcinoma lung progressed on Keytruda. He had previously responded to Carbo+ Alimta and therefore plan was made to return to Carbo/Alimta with Avastin . Today would be his cycle 1  He is stable to proceed with treatment today a planned Carbo dose calculated per current creatine Reminded to take Decadron and B12 and folate as ordered He willet next week for fe/. If he does well, plan as per Dr. Mike Gip is to add Avastin.to his regimen.  PET CT from 01/16/2016 was reviewed with him at the request of his sister. That showed progressive disease in the form of increase in the previously known mass but also new lesions in the sub carina.  He will also continue Xgeva for bone metastasis as scheduled

## 2016-01-24 LAB — CEA: CEA: 1458 ng/mL — ABNORMAL HIGH (ref 0.0–4.7)

## 2016-01-28 ENCOUNTER — Other Ambulatory Visit: Payer: Self-pay | Admitting: *Deleted

## 2016-01-28 ENCOUNTER — Telehealth: Payer: Self-pay | Admitting: *Deleted

## 2016-01-28 NOTE — Telephone Encounter (Signed)
Got a call from case manager about that she knew that the pt progressed and that he was going to start a different treatment and wanted to [facility] that regimen he is on.  I called her back and left her a message that he started [place] and alimita 01/23/16.  She also left her fax # [phone].

## 2016-01-29 ENCOUNTER — Other Ambulatory Visit: Payer: Self-pay | Admitting: *Deleted

## 2016-01-30 ENCOUNTER — Inpatient Hospital Stay: Payer: PRIVATE HEALTH INSURANCE

## 2016-01-30 ENCOUNTER — Inpatient Hospital Stay (HOSPITAL_BASED_OUTPATIENT_CLINIC_OR_DEPARTMENT_OTHER): Payer: PRIVATE HEALTH INSURANCE | Admitting: Hematology and Oncology

## 2016-01-30 ENCOUNTER — Encounter: Payer: Self-pay | Admitting: Hematology and Oncology

## 2016-01-30 ENCOUNTER — Other Ambulatory Visit: Payer: Self-pay | Admitting: *Deleted

## 2016-01-30 VITALS — BP 117/79 | HR 97 | Temp 95.3°F | Resp 18 | Wt 241.6 lb

## 2016-01-30 DIAGNOSIS — C3490 Malignant neoplasm of unspecified part of unspecified bronchus or lung: Secondary | ICD-10-CM

## 2016-01-30 DIAGNOSIS — Z9221 Personal history of antineoplastic chemotherapy: Secondary | ICD-10-CM | POA: Diagnosis not present

## 2016-01-30 DIAGNOSIS — D649 Anemia, unspecified: Secondary | ICD-10-CM

## 2016-01-30 DIAGNOSIS — C3491 Malignant neoplasm of unspecified part of right bronchus or lung: Secondary | ICD-10-CM | POA: Diagnosis not present

## 2016-01-30 DIAGNOSIS — C7951 Secondary malignant neoplasm of bone: Secondary | ICD-10-CM

## 2016-01-30 DIAGNOSIS — M109 Gout, unspecified: Secondary | ICD-10-CM

## 2016-01-30 DIAGNOSIS — J910 Malignant pleural effusion: Secondary | ICD-10-CM

## 2016-01-30 DIAGNOSIS — Z79899 Other long term (current) drug therapy: Secondary | ICD-10-CM

## 2016-01-30 DIAGNOSIS — Z801 Family history of malignant neoplasm of trachea, bronchus and lung: Secondary | ICD-10-CM

## 2016-01-30 DIAGNOSIS — Z5111 Encounter for antineoplastic chemotherapy: Secondary | ICD-10-CM | POA: Diagnosis not present

## 2016-01-30 DIAGNOSIS — Z9981 Dependence on supplemental oxygen: Secondary | ICD-10-CM

## 2016-01-30 DIAGNOSIS — I10 Essential (primary) hypertension: Secondary | ICD-10-CM

## 2016-01-30 DIAGNOSIS — R970 Elevated carcinoembryonic antigen [CEA]: Secondary | ICD-10-CM

## 2016-01-30 LAB — BASIC METABOLIC PANEL
ANION GAP: 9 (ref 5–15)
BUN: 18 mg/dL (ref 6–20)
CALCIUM: 8.9 mg/dL (ref 8.9–10.3)
CHLORIDE: 94 mmol/L — AB (ref 101–111)
CO2: 29 mmol/L (ref 22–32)
Creatinine, Ser: 1.04 mg/dL (ref 0.61–1.24)
GFR calc Af Amer: >60 mL/min (ref >60)
GFR calc non Af Amer: >60 mL/min (ref >60)
GLUCOSE: 109 mg/dL — AB (ref 65–99)
POTASSIUM: 4.5 mmol/L (ref 3.5–5.1)
Sodium: 132 mmol/L — ABNORMAL LOW (ref 135–145)

## 2016-01-30 LAB — CBC WITH DIFFERENTIAL/PLATELET
BASOS ABS: 0 10*3/uL (ref 0–0.1)
Basophils Relative: 0 %
Eosinophils Absolute: 0.2 10*3/uL (ref 0–0.7)
Eosinophils Relative: 4 %
HEMATOCRIT: 39.5 % — AB (ref 40.0–52.0)
Hemoglobin: 13.7 g/dL (ref 13.0–18.0)
LYMPHS PCT: 20 %
Lymphs Abs: 1 10*3/uL (ref 1.0–3.6)
MCH: 31.8 pg (ref 26.0–34.0)
MCHC: 34.6 g/dL (ref 32.0–36.0)
MCV: 91.7 fL (ref 80.0–100.0)
MONO ABS: 0.3 10*3/uL (ref 0.2–1.0)
Monocytes Relative: 7 %
NEUTROS ABS: 3.5 10*3/uL (ref 1.4–6.5)
Neutrophils Relative %: 69 %
Platelets: 119 10*3/uL — ABNORMAL LOW (ref 150–440)
RBC: 4.31 MIL/uL — ABNORMAL LOW (ref 4.40–5.90)
RDW: 13.3 % (ref 11.5–14.5)
WBC: 5.1 10*3/uL (ref 3.8–10.6)

## 2016-01-30 NOTE — Progress Notes (Signed)
Patient experienced nausea earlier in the week from his treatment  Otherwise, doing well.  Patient accompanied by daughter today who states her dad breathes heavy.  O2  94%.

## 2016-01-30 NOTE — Progress Notes (Signed)
[facility] day:  01/30/2016   Chief Complaint: Stuart Spence is a 55 y.o. male with metastatic lung cancer who is seen for assessment on day 8 of cycle #1 carboplatin and Alimta.  HPI:  The patient was last seen in the medical oncology clinic on 01/19/2016.  At that time, he felt fine.  He denied any complaint.  Scan revealed progressive disease.  We discussed return back to carboplatin and Alimta +/- Avastin.  Patient was considering Avastin.   He received B12.  He was seen by Dr.Perumandla on 01/23/2016 in my absence.  He received carboplatin and Alimta alone.  Symptomatically, he denies any complaint.  He feels "good".  He denies any respiratory symptoms.  He denies any pain.  He has "noticed" that he has received chemotherapy (minimal side effects).   Past Medical History:  Diagnosis Date  . Cancer ([place])    Lung  . Depression   . ED (erectile dysfunction)   . Gout   . Hypertension   . On home oxygen therapy   . Personal history of chemotherapy    PT TAKING CHEMO EVERY 3 WEEKS    Past Surgical History:  Procedure Laterality Date  . CHEST TUBE INSERTION Left 08/13/2015   Procedure: CHEST TUBE INSERTION;  Surgeon: Nestor Lewandowsky, MD;  Location: [facility] ORS;  Service: General;  Laterality: Left;  . CHEST TUBE INSERTION N/A 09/18/2015   Procedure: PLEURX CATH REMOVAL;  Surgeon: Nestor Lewandowsky, MD;  Location: [facility] ORS;  Service: Thoracic;  Laterality: N/A;  . PORTACATH PLACEMENT Left 08/13/2015   Procedure: INSERTION PORT-A-CATH;  Surgeon: Nestor Lewandowsky, MD;  Location: [facility] ORS;  Service: General;  Laterality: Left;  . PORTACATH PLACEMENT    . TONSILLECTOMY      Family History  Problem Relation Age of Onset  . Cancer Mother 38    lung  . Heart attack Father 19  . Hypertension Father   . Congestive Heart Failure Father 68    died from  . Diabetes Sister     Social History:  reports that he has never smoked. He has never used smokeless  tobacco. He reports that he does not drink alcohol or use drugs.  He has worked 53 years in the [employer].He notes exposure to chemicals.   He recently switched jobs.  He is not working.  He had previously planned on helping to build 2 houses.  Patient's wife name is [name].  He is accompanied by his daughter today.  Allergies: No Known Allergies  Current Medications: Current Outpatient Prescriptions  Medication Sig Dispense Refill  . albuterol (PROVENTIL HFA;VENTOLIN HFA) 108 (90 Base) MCG/ACT inhaler Inhale 1-2 puffs into the lungs See admin instructions. Reported on 09/12/2015    . amLODipine (NORVASC) 5 MG tablet Take 1 tablet (5 mg total) by mouth daily. 30 tablet 6  . Calcium Carbonate (CALCIUM 600 PO) Take 1,200 mg by mouth 2 (two) times daily.    . colchicine 0.6 MG tablet Take 0.6 mg by mouth daily as needed (two pills by mouth at onset, the one pill one hour later, maximum three pills per gout flare).    Marland Kitchen dexamethasone (DECADRON) 4 MG tablet 4 mg tablet twice a day the day before chemo, and the day after chemo with each regimen of carbo/alimta 36 tablet 0  . FLUoxetine (PROZAC) 20 MG capsule Take 2 capsules (40 mg total) by mouth daily. (Patient taking differently: Take 40 mg by mouth every morning. )  60 capsule 6  . folic acid (FOLVITE) 1 MG tablet Take 1 tablet (1 mg total) by mouth daily. 30 tablet 3  . HYDROcodone-acetaminophen (NORCO/VICODIN) 5-325 MG tablet Take 1 tablet by mouth every 6 (six) hours. Reported on 09/18/2015    . lidocaine-prilocaine (EMLA) cream Apply 1 application topically as needed. 30 g 2  . ondansetron (ZOFRAN) 8 MG tablet Take 1 tablet (8 mg total) by mouth every 8 (eight) hours as needed for nausea or vomiting. 20 tablet 3  . OXYGEN Inhale 2 L into the lungs continuous. Reported on 09/12/2015    . sildenafil (REVATIO) 20 MG tablet Take 1 tablet (20 mg total) by mouth daily as needed. 50 tablet 12  . sildenafil (VIAGRA) 100 MG tablet Take 1 tablet (100 mg  total) by mouth daily as needed for erectile dysfunction. 10 tablet 12  . telmisartan (MICARDIS) 40 MG tablet Take 1 tablet (40 mg total) by mouth daily. 30 tablet 6   No current facility-administered medications for this visit.     Review of Systems:  GENERAL: Feels good. No fevers or sweats.  Active. Weight down 7 pounds. PERFORMANCE STATUS (ECOG): 1 HEENT: No visual changes, runny nose, sore throat, mouth sores or tenderness. Lungs: No shortness of breath. No cough. No hemoptysis.   Cardiac: No chest pain, palpitations, orthopnea, or PND. GI: No nausea, vomiting, diarrhea, constipation, melena or hematochezia. No prior colonoscopy. GU: No urgency, frequency, dysuria, or hematuria.  Musculoskeletal: No back pain. No joint pain. No muscle tenderness. Extremities: No pain or swelling. Skin: No rashes or skin changes. Neuro: No headache, numbness or weakness, balance or coordination issues. Endocrine: No diabetes, thyroid issues, hot flashes or night sweats. Psych: No mood changes, depression or anxiety. Pain: No focal pain. Review of systems: All other systems reviewed and found to be negative.  Physical Exam: Blood pressure 117/79, pulse 97, temperature (!) 95.3 F (35.2 C), temperature source Tympanic, resp. rate 18, weight 241 lb 10 oz (109.6 kg). GENERAL: Well developed, well nourished, heavyset gentleman sitting in the exam room in no acute distress. MENTAL STATUS: Alert and oriented to person, place and time. HEAD: Short dark hair. Goatee. Normocephalic, atraumatic, face symmetric, no Cushingoid features. EYES: Oval glasses. Pupils equal round and reactive to light and accomodation. No conjunctivitis or scleral icterus. ENT: Oropharynx clear without lesion. Tongue normal. Mucous membranes moist.  RESPIRATORY: Clear to auscultation without rales, wheezes or rhonchi. CARDIOVASCULAR: Regular rate and rhythm without murmur, rub or gallop. ABDOMEN:  Soft, non-tender, with active bowel sounds, and no hepatosplenomegaly. No masses. SKIN: No rashes, ulcers or lesions. EXTREMITIES: Mild chronic lower extremity changes.  No skin discoloration or tenderness. No palpable cords. LYMPH NODES: No palpable cervical, supraclavicular, axillary or inguinal adenopathy  NEUROLOGICAL: Unremarkable. PSYCH: Appropriate.   Appointment on 01/30/2016  Component Date Value Ref Range Status  . WBC 01/30/2016 5.1  3.8 - 10.6 K/uL Final  . RBC 01/30/2016 4.31* 4.40 - 5.90 MIL/uL Final  . Hemoglobin 01/30/2016 13.7  13.0 - 18.0 g/dL Final  . HCT 01/30/2016 39.5* 40.0 - 52.0 % Final  . MCV 01/30/2016 91.7  80.0 - 100.0 fL Final  . MCH 01/30/2016 31.8  26.0 - 34.0 pg Final  . MCHC 01/30/2016 34.6  32.0 - 36.0 g/dL Final  . RDW 01/30/2016 13.3  11.5 - 14.5 % Final  . Platelets 01/30/2016 119* 150 - 440 K/uL Final  . Neutrophils Relative % 01/30/2016 69  % Final  . Neutro Abs 01/30/2016 3.5  1.4 - 6.5 K/uL Final  . Lymphocytes Relative 01/30/2016 20  % Final  . Lymphs Abs 01/30/2016 1.0  1.0 - 3.6 K/uL Final  . Monocytes Relative 01/30/2016 7  % Final  . Monocytes Absolute 01/30/2016 0.3  0.2 - 1.0 K/uL Final  . Eosinophils Relative 01/30/2016 4  % Final  . Eosinophils Absolute 01/30/2016 0.2  0 - 0.7 K/uL Final  . Basophils Relative 01/30/2016 0  % Final  . Basophils Absolute 01/30/2016 0.0  0 - 0.1 K/uL Final  . Sodium 01/30/2016 132* 135 - 145 mmol/L Final  . Potassium 01/30/2016 4.5  3.5 - 5.1 mmol/L Final  . Chloride 01/30/2016 94* 101 - 111 mmol/L Final  . CO2 01/30/2016 29  22 - 32 mmol/L Final  . Glucose, Bld 01/30/2016 109* 65 - 99 mg/dL Final  . BUN 01/30/2016 18  6 - 20 mg/dL Final  . Creatinine, Ser 01/30/2016 1.04  0.61 - 1.24 mg/dL Final  . Calcium 01/30/2016 8.9  8.9 - 10.3 mg/dL Final  . GFR calc non Af Amer 01/30/2016 >60  >60 mL/min Final  . GFR calc Af Amer 01/30/2016 >60  >60 mL/min Final   Comment: (NOTE) The eGFR has been  calculated using the CKD EPI equation. This calculation has not been validated in all clinical situations. eGFR's persistently <60 mL/min signify possible Chronic Kidney Disease.   . Anion gap 01/30/2016 9  5 - 15 Final    Assessment:  Braison Snoke is a 55 y.o. male with stage IV adenocarcinoma of the right lung.  He has no smoking history.  He presented with with a large right sided pleural effusion and an ill-defined 4.5 x 4.5 cm mass in the right upper lobe. Thoracentesis x 2 of approximately 4.7 liters of blood fluid revealed adenocarcinoma. TTF-1 and Napsin A were immunoreactive and consistent with a lung primary. Pleur-X catheter was placed on 08/13/2015 (removed 09/18/2015).  [facility] testing on 08/18/2015 was negative for:  EGFR, ALK, ROS1 and RET.  Chest CT angiogram on 08/10/2015 revealed and ill-defined 4.5 x 4.5 cm area of hypodensity in the right upper lobe with small areas of air bronchogram. This was incompletely characterized but is concerning for a centrally obstructing mass. There was complete occlusion of the right upper, right middle, and right lower lobe bronchi with complete collapse of the right lung. There was a large right pleural effusion. There was a lytic lesion involving the right fourth rib compatible with metastatic disease. There was no CT evidence of pulmonary embolism.  PET scan on 08/20/2015 revealed a hypermetabolic 5.2 cm medial right upper lobe lung mass, consistent with primary bronchogenic carcinoma, with a broad attachment to the medial right upper lobe pleura.  There was interlobular septal thickening throughout the right lung suggested a component of lymphangitic tumor.  There was a malignant small right pleural effusion with hypermetabolism throughout the right pleural space.  There was hypermetabolic ipsilateral hilar, subcarinal and ipsilateral mediastinal nodal metastases.  There was extensive hypermetabolic lytic osseous metastases throughout the  axial and proximal appendicular skeleton.    Regarding the skeleton, there were numerous hypermetabolic faintly lytic osseous metastases in the left humeral head (SUV 6.8), right acromion, bilateral ribs most prominent in the anterior right fourth rib  (SUV 16.0), thoracolumbar spine (T11 vertebral body with SUV 9.4), bilateral iliac bones (medial right iliac bone with SUV 12.1 and left anterior acetabulum with SUV 10.0), and right proximal femur (SUV 10.0 in the region of the right lesser trochanter).  Plain films on 08/21/2015 revealed a left humerus lytic lesion with some thinning and some absence of the cortical margin.  He received 800 cGy to the left humerus on 09/17/2015.  CEA was 837.1 on 09/12/2015, 448 on 10/10/2015, 346 on 10/31/2015, 261.3 on 11/21/2015, 726.2 on 01/02/2016, 857.2 on 01/09/2016, 1458.0 on 01/23/2016, and 1350.0 on 01/30/2016.  Anemia work-up on 09/04/2015 revealed a ferritin (519), iron saturation (18%), B12 (3372), and folate (21.9).  He received 4 cycles of Keytruda, carboplatin and Alimta (08/22/2015 - 10/31/2015).   He received 3 cycles of single agent Keytruda (11/21/2015 - 01/02/2016).  Pleur-X catheter was removed on 09/18/2015.  He restarted carboplatin and Alimta on 01/23/2016.  He receives monthly Xgeva (began 08/22/2015; last 01/09/2016).   PET scan on 11/20/2015 revealed a marked positive response to therapy.  There was decrease in size and metabolic activity of RIGHT perihilar mass, resolution of mediastinal nodal metabolic activity, and marked decrease in size and metabolic activity of RIGHT anterior chest wall metastasis.  There was decreased in metabolic activity of multiple skeletal metastasis.    PET scan on 01/16/2016 revealed a marked interval progression of disease compared with previous exam.  There was progression of multifocal hypermetabolic bone metastases.  There was the interval enlargement and right lung perihilar mass with development of  pleural spread of tumor and lymphangitic spread of tumor within the right lung.  There was new hypermetabolic and enlarged sub-carinal lymph node.  Symptomatically, he denies any complaint.  Exam is unremarkable.  Plan: 1.  Labs today:  CBC with diff, CMP, CEA. 2.  Review last PET scan with patient and daughter. 3.  Re-review risks and potential benefits of the addition of Avastin.  Avastin has been shown to increase the response rate (RR), progression free survival (PFS), and overall survival (OS) compared to chemotherapy alone.  In a meta-analysis of 4 trials (AVF-0757g, [id number], ECOG 4599 and AVAiL) with over 2000 patients published in Annals of Oncology (2013), Avastin prolonged OS (HR 0.90) and PFS (0.72).  However, there was increased risk of grade 3 toxicity.  Grade 3 or higher adverse events included thromboembolism 8%, hypertension 6%, bleeding 4%, proteinuria 3%, and pulmonary hemorrhage 1%. Treatment-related mortality occurred in 3%.   If patient responds well to carboplatin + Alimta +/- Avastin, discuss maintenance Alimta +/- Avastin.  In the AVAPERL study, patients with nonsquamous NSCLC who had achieved disease control with platinum-based chemotherapy + Avastin,  Avastin plus Alimta maintenance was associated with a significant PFS benefit compared with Avastin alone ([employer] 2013).  At a median follow-up of 8 months, PFS was significantly improved in the Avastin + Alimta arm (3.7 months versus 7.4 months).   4.  Await [facility] testing for potential other treatment options. 5.  Continue daily folic acid.  Remind patient to take steroids around chemotherapy (day before and day after). 6.  Anticipate next Xgeva on 02/06/2016. 7.  RTC on 02/13/2016 for MD assessment, labs (CBC with diff, CMP, Mg, CEA), carboplatin + Alimta +/- Avastin.   Lequita Asal, MD  01/30/2016, 11:50 AM

## 2016-01-31 ENCOUNTER — Encounter: Payer: Self-pay | Admitting: Hematology and Oncology

## 2016-01-31 LAB — CEA: CEA: 1350 ng/mL — ABNORMAL HIGH (ref 0.0–4.7)

## 2016-02-02 ENCOUNTER — Other Ambulatory Visit: Payer: Self-pay | Admitting: *Deleted

## 2016-02-02 DIAGNOSIS — C3490 Malignant neoplasm of unspecified part of unspecified bronchus or lung: Secondary | ICD-10-CM

## 2016-02-02 DIAGNOSIS — C7951 Secondary malignant neoplasm of bone: Secondary | ICD-10-CM

## 2016-02-05 ENCOUNTER — Other Ambulatory Visit: Payer: Self-pay | Admitting: Hematology and Oncology

## 2016-02-06 ENCOUNTER — Inpatient Hospital Stay: Payer: No Typology Code available for payment source | Attending: Hematology and Oncology

## 2016-02-06 ENCOUNTER — Other Ambulatory Visit: Payer: Self-pay | Admitting: *Deleted

## 2016-02-06 ENCOUNTER — Inpatient Hospital Stay: Payer: No Typology Code available for payment source

## 2016-02-06 ENCOUNTER — Other Ambulatory Visit: Payer: Self-pay | Admitting: Hematology and Oncology

## 2016-02-06 DIAGNOSIS — Z79899 Other long term (current) drug therapy: Secondary | ICD-10-CM | POA: Diagnosis not present

## 2016-02-06 DIAGNOSIS — Z9981 Dependence on supplemental oxygen: Secondary | ICD-10-CM | POA: Diagnosis not present

## 2016-02-06 DIAGNOSIS — C3411 Malignant neoplasm of upper lobe, right bronchus or lung: Secondary | ICD-10-CM | POA: Diagnosis not present

## 2016-02-06 DIAGNOSIS — I10 Essential (primary) hypertension: Secondary | ICD-10-CM | POA: Insufficient documentation

## 2016-02-06 DIAGNOSIS — C7951 Secondary malignant neoplasm of bone: Secondary | ICD-10-CM

## 2016-02-06 DIAGNOSIS — Z801 Family history of malignant neoplasm of trachea, bronchus and lung: Secondary | ICD-10-CM | POA: Diagnosis not present

## 2016-02-06 DIAGNOSIS — Z5111 Encounter for antineoplastic chemotherapy: Secondary | ICD-10-CM | POA: Insufficient documentation

## 2016-02-06 DIAGNOSIS — M109 Gout, unspecified: Secondary | ICD-10-CM | POA: Diagnosis not present

## 2016-02-06 DIAGNOSIS — B379 Candidiasis, unspecified: Secondary | ICD-10-CM | POA: Diagnosis not present

## 2016-02-06 DIAGNOSIS — J910 Malignant pleural effusion: Secondary | ICD-10-CM | POA: Insufficient documentation

## 2016-02-06 DIAGNOSIS — C3490 Malignant neoplasm of unspecified part of unspecified bronchus or lung: Secondary | ICD-10-CM

## 2016-02-06 LAB — BASIC METABOLIC PANEL
Anion gap: 6 (ref 5–15)
BUN: 12 mg/dL (ref 6–20)
CO2: 31 mmol/L (ref 22–32)
Calcium: 9 mg/dL (ref 8.9–10.3)
Chloride: 102 mmol/L (ref 101–111)
Creatinine, Ser: 1.22 mg/dL (ref 0.61–1.24)
GFR calc Af Amer: >60 mL/min (ref >60)
GFR calc non Af Amer: >60 mL/min (ref >60)
Glucose, Bld: 137 mg/dL — ABNORMAL HIGH (ref 65–99)
Potassium: 4.1 mmol/L (ref 3.5–5.1)
Sodium: 139 mmol/L (ref 135–145)

## 2016-02-06 MED ORDER — DENOSUMAB 120 MG/1.7ML ~~LOC~~ SOLN
120.0000 mg | Freq: Once | SUBCUTANEOUS | Status: AC
  Administered 2016-02-06: 120 mg via SUBCUTANEOUS
  Filled 2016-02-06: qty 1.7

## 2016-02-12 ENCOUNTER — Other Ambulatory Visit: Payer: Self-pay | Admitting: *Deleted

## 2016-02-12 DIAGNOSIS — R918 Other nonspecific abnormal finding of lung field: Secondary | ICD-10-CM

## 2016-02-12 MED ORDER — FOLIC ACID 1 MG PO TABS: 1.0000 mg | ORAL_TABLET | Freq: Every day | ORAL | 3 refills | Status: DC

## 2016-02-13 ENCOUNTER — Other Ambulatory Visit: Payer: Self-pay | Admitting: Hematology and Oncology

## 2016-02-13 ENCOUNTER — Inpatient Hospital Stay (HOSPITAL_BASED_OUTPATIENT_CLINIC_OR_DEPARTMENT_OTHER): Payer: No Typology Code available for payment source | Admitting: Hematology and Oncology

## 2016-02-13 ENCOUNTER — Inpatient Hospital Stay: Payer: No Typology Code available for payment source

## 2016-02-13 VITALS — BP 149/85 | HR 96 | Temp 94.5°F | Resp 18 | Wt 240.5 lb

## 2016-02-13 DIAGNOSIS — Z9981 Dependence on supplemental oxygen: Secondary | ICD-10-CM | POA: Diagnosis not present

## 2016-02-13 DIAGNOSIS — Z801 Family history of malignant neoplasm of trachea, bronchus and lung: Secondary | ICD-10-CM

## 2016-02-13 DIAGNOSIS — J910 Malignant pleural effusion: Secondary | ICD-10-CM

## 2016-02-13 DIAGNOSIS — C7951 Secondary malignant neoplasm of bone: Secondary | ICD-10-CM | POA: Diagnosis not present

## 2016-02-13 DIAGNOSIS — Z79899 Other long term (current) drug therapy: Secondary | ICD-10-CM

## 2016-02-13 DIAGNOSIS — C3491 Malignant neoplasm of unspecified part of right bronchus or lung: Secondary | ICD-10-CM

## 2016-02-13 DIAGNOSIS — Z5111 Encounter for antineoplastic chemotherapy: Secondary | ICD-10-CM | POA: Diagnosis not present

## 2016-02-13 DIAGNOSIS — I10 Essential (primary) hypertension: Secondary | ICD-10-CM

## 2016-02-13 DIAGNOSIS — C3490 Malignant neoplasm of unspecified part of unspecified bronchus or lung: Secondary | ICD-10-CM

## 2016-02-13 DIAGNOSIS — M109 Gout, unspecified: Secondary | ICD-10-CM

## 2016-02-13 DIAGNOSIS — C3411 Malignant neoplasm of upper lobe, right bronchus or lung: Secondary | ICD-10-CM | POA: Diagnosis not present

## 2016-02-13 DIAGNOSIS — B379 Candidiasis, unspecified: Secondary | ICD-10-CM

## 2016-02-13 DIAGNOSIS — B3789 Other sites of candidiasis: Secondary | ICD-10-CM

## 2016-02-13 LAB — CBC WITH DIFFERENTIAL/PLATELET
Basophils Absolute: 0 10*3/uL (ref 0–0.1)
Basophils Relative: 0 %
Eosinophils Absolute: 0 10*3/uL (ref 0–0.7)
Eosinophils Relative: 0 %
HCT: 40.5 % (ref 40.0–52.0)
Hemoglobin: 14.1 g/dL (ref 13.0–18.0)
Lymphocytes Relative: 7 %
Lymphs Abs: 0.9 10*3/uL — ABNORMAL LOW (ref 1.0–3.6)
MCH: 31.8 pg (ref 26.0–34.0)
MCHC: 34.8 g/dL (ref 32.0–36.0)
MCV: 91.3 fL (ref 80.0–100.0)
Monocytes Absolute: 0.7 10*3/uL (ref 0.2–1.0)
Monocytes Relative: 5 %
Neutro Abs: 11.7 10*3/uL — ABNORMAL HIGH (ref 1.4–6.5)
Neutrophils Relative %: 88 %
Platelets: 306 10*3/uL (ref 150–440)
RBC: 4.44 MIL/uL (ref 4.40–5.90)
RDW: 14.1 % (ref 11.5–14.5)
WBC: 13.3 10*3/uL — ABNORMAL HIGH (ref 3.8–10.6)

## 2016-02-13 LAB — COMPREHENSIVE METABOLIC PANEL
ALT: 30 U/L (ref 17–63)
AST: 27 U/L (ref 15–41)
Albumin: 3.7 g/dL (ref 3.5–5.0)
Alkaline Phosphatase: 146 U/L — ABNORMAL HIGH (ref 38–126)
Anion gap: 9 (ref 5–15)
BUN: 19 mg/dL (ref 6–20)
CO2: 25 mmol/L (ref 22–32)
Calcium: 10 mg/dL (ref 8.9–10.3)
Chloride: 101 mmol/L (ref 101–111)
Creatinine, Ser: 1.22 mg/dL (ref 0.61–1.24)
GFR calc Af Amer: >60 mL/min (ref >60)
GFR calc non Af Amer: >60 mL/min (ref >60)
Glucose, Bld: 187 mg/dL — ABNORMAL HIGH (ref 65–99)
Potassium: 4.2 mmol/L (ref 3.5–5.1)
Sodium: 135 mmol/L (ref 135–145)
Total Bilirubin: 0.4 mg/dL (ref 0.3–1.2)
Total Protein: 7.8 g/dL (ref 6.5–8.1)

## 2016-02-13 LAB — MAGNESIUM: Magnesium: 1.8 mg/dL (ref 1.7–2.4)

## 2016-02-13 MED ORDER — SODIUM CHLORIDE 0.9 % IV SOLN
670.0000 mg | Freq: Once | INTRAVENOUS | Status: AC
  Administered 2016-02-13: 670 mg via INTRAVENOUS
  Filled 2016-02-13: qty 67

## 2016-02-13 MED ORDER — HEPARIN SOD (PORK) LOCK FLUSH 100 UNIT/ML IV SOLN
500.0000 [IU] | Freq: Once | INTRAVENOUS | Status: AC
  Administered 2016-02-13: 500 [IU] via INTRAVENOUS

## 2016-02-13 MED ORDER — PALONOSETRON HCL INJECTION 0.25 MG/5ML
0.2500 mg | Freq: Once | INTRAVENOUS | Status: AC
  Administered 2016-02-13: 0.25 mg via INTRAVENOUS
  Filled 2016-02-13: qty 5

## 2016-02-13 MED ORDER — SODIUM CHLORIDE 0.9 % IJ SOLN
10.0000 mL | Freq: Once | INTRAMUSCULAR | Status: AC
  Administered 2016-02-13: 10 mL via INTRAVENOUS
  Filled 2016-02-13: qty 10

## 2016-02-13 MED ORDER — PEMETREXED DISODIUM CHEMO INJECTION 500 MG
1100.0000 mg | Freq: Once | INTRAVENOUS | Status: AC
  Administered 2016-02-13: 1100 mg via INTRAVENOUS
  Filled 2016-02-13: qty 40

## 2016-02-13 MED ORDER — SODIUM CHLORIDE 0.9 % IV SOLN: 10.0000 mg | Freq: Once | INTRAVENOUS | Status: DC

## 2016-02-13 MED ORDER — SODIUM CHLORIDE 0.9 % IV SOLN
Freq: Once | INTRAVENOUS | Status: AC
  Administered 2016-02-13: 11:00:00 via INTRAVENOUS
  Filled 2016-02-13: qty 1000

## 2016-02-13 MED ORDER — DEXAMETHASONE SODIUM PHOSPHATE 10 MG/ML IJ SOLN
10.0000 mg | Freq: Once | INTRAMUSCULAR | Status: AC
  Administered 2016-02-13: 10 mg via INTRAVENOUS
  Filled 2016-02-13: qty 1

## 2016-02-13 MED ORDER — NYSTATIN 100000 UNIT/GM EX POWD: Freq: Three times a day (TID) | CUTANEOUS | 1 refills | Status: DC | PRN

## 2016-02-13 NOTE — Progress Notes (Signed)
[facility] day:  02/13/2016   Chief Complaint: Stuart Spence is a 55 y.o. male with metastatic lung cancer who is seen for assessment prior to cycle #2 carboplatin and Alimta +/- Avastin.  HPI:  The patient was last seen in the medical oncology clinic on 01/30/2016.  At that time, he was day 8 s/p cycle #1 carboplatin and Alimta.  He felt good.  He noted minimal side effects of chemotherapy.  We discussed the risks and benefits of the addition of Avastin.  He received Xgeva on 02/06/2016.  During the interim, he has done well.  He denies any respiratory symptoms. He only notes a rash in his groins bilaterally where he has been sweating.   Past Medical History:  Diagnosis Date  . Cancer ([place])    Lung  . Depression   . ED (erectile dysfunction)   . Gout   . Hypertension   . On home oxygen therapy   . Personal history of chemotherapy    PT TAKING CHEMO EVERY 3 WEEKS    Past Surgical History:  Procedure Laterality Date  . CHEST TUBE INSERTION Left 08/13/2015   Procedure: CHEST TUBE INSERTION;  Surgeon: Nestor Lewandowsky, MD;  Location: [facility] ORS;  Service: General;  Laterality: Left;  . CHEST TUBE INSERTION N/A 09/18/2015   Procedure: PLEURX CATH REMOVAL;  Surgeon: Nestor Lewandowsky, MD;  Location: [facility] ORS;  Service: Thoracic;  Laterality: N/A;  . PORTACATH PLACEMENT Left 08/13/2015   Procedure: INSERTION PORT-A-CATH;  Surgeon: Nestor Lewandowsky, MD;  Location: [facility] ORS;  Service: General;  Laterality: Left;  . PORTACATH PLACEMENT    . TONSILLECTOMY      Family History  Problem Relation Age of Onset  . Cancer Mother 37    lung  . Heart attack Father 27  . Hypertension Father   . Congestive Heart Failure Father 83    died from  . Diabetes Sister     Social History:  reports that he has never smoked. He has never used smokeless tobacco. He reports that he does not drink alcohol or use drugs.  He has worked 19 years in the [employer].He notes  exposure to chemicals.   He recently switched jobs.  He is not working.  He had previously planned on helping to build 2 houses.  Patient's wife name is [name].  He is alone  today.  Allergies: No Known Allergies  Current Medications: Current Outpatient Prescriptions  Medication Sig Dispense Refill  . albuterol (PROVENTIL HFA;VENTOLIN HFA) 108 (90 Base) MCG/ACT inhaler Inhale 1-2 puffs into the lungs See admin instructions. Reported on 09/12/2015    . amLODipine (NORVASC) 5 MG tablet Take 1 tablet (5 mg total) by mouth daily. 30 tablet 6  . Calcium Carbonate (CALCIUM 600 PO) Take 1,200 mg by mouth 2 (two) times daily.    . colchicine 0.6 MG tablet Take 0.6 mg by mouth daily as needed (two pills by mouth at onset, the one pill one hour later, maximum three pills per gout flare).    Marland Kitchen dexamethasone (DECADRON) 4 MG tablet 4 mg tablet twice a day the day before chemo, and the day after chemo with each regimen of carbo/alimta 36 tablet 0  . FLUoxetine (PROZAC) 20 MG capsule Take 2 capsules (40 mg total) by mouth daily. (Patient taking differently: Take 40 mg by mouth every morning. ) 60 capsule 6  . folic acid (FOLVITE) 1 MG tablet Take 1 tablet (1 mg total) by  mouth daily. 30 tablet 3  . lidocaine-prilocaine (EMLA) cream Apply 1 application topically as needed. 30 g 2  . ondansetron (ZOFRAN) 8 MG tablet Take 1 tablet (8 mg total) by mouth every 8 (eight) hours as needed for nausea or vomiting. 20 tablet 3  . OXYGEN Inhale 2 L into the lungs continuous. Reported on 09/12/2015    . sildenafil (VIAGRA) 100 MG tablet Take 1 tablet (100 mg total) by mouth daily as needed for erectile dysfunction. 10 tablet 12  . telmisartan (MICARDIS) 40 MG tablet Take 1 tablet (40 mg total) by mouth daily. 30 tablet 6   No current facility-administered medications for this visit.     Review of Systems:  GENERAL: Feels good. No fevers or sweats.  Active. Weight down 1 pound. PERFORMANCE STATUS (ECOG): 1 HEENT: No  visual changes, runny nose, sore throat, mouth sores or tenderness. Lungs: No shortness of breath. No cough. No hemoptysis.   Cardiac: No chest pain, palpitations, orthopnea, or PND. GI: No nausea, vomiting, diarrhea, constipation, melena or hematochezia. No prior colonoscopy. GU: No urgency, frequency, dysuria, or hematuria.  Musculoskeletal: No back pain. No joint pain. No muscle tenderness. Extremities: No pain or swelling. Skin: Groin rash. Neuro: No headache, numbness or weakness, balance or coordination issues. Endocrine: No diabetes, thyroid issues, hot flashes or night sweats. Psych: No mood changes, depression or anxiety. Pain: No focal pain. Review of systems: All other systems reviewed and found to be negative.  Physical Exam: Blood pressure (!) 149/85, pulse 96, temperature (!) 94.5 F (34.7 C), temperature source Tympanic, resp. rate 18, weight 240 lb 8.4 oz (109.1 kg). GENERAL: Well developed, well nourished, heavyset gentleman sitting in the exam room in no acute distress. MENTAL STATUS: Alert and oriented to person, place and time. HEAD: Short dark hair. Goatee. Normocephalic, atraumatic, face symmetric, no Cushingoid features. EYES: Oval glasses. Pupils equal round and reactive to light and accomodation. No conjunctivitis or scleral icterus. ENT: Oropharynx clear without lesion. Tongue normal. Mucous membranes moist.  RESPIRATORY: Clear to auscultation without rales, wheezes or rhonchi. CARDIOVASCULAR: Regular rate and rhythm without murmur, rub or gallop. ABDOMEN: Soft, non-tender, with active bowel sounds, and no hepatosplenomegaly. No masses. SKIN: Candidal rash in right inguinal area. EXTREMITIES: Mild chronic lower extremity changes.  No skin discoloration or tenderness. No palpable cords. LYMPH NODES: No palpable cervical, supraclavicular, axillary or inguinal adenopathy  NEUROLOGICAL: Unremarkable. PSYCH:  Appropriate.   Clinical Support on 02/13/2016  Component Date Value Ref Range Status  . WBC 02/13/2016 13.3* 3.8 - 10.6 K/uL Final  . RBC 02/13/2016 4.44  4.40 - 5.90 MIL/uL Final  . Hemoglobin 02/13/2016 14.1  13.0 - 18.0 g/dL Final  . HCT 02/13/2016 40.5  40.0 - 52.0 % Final  . MCV 02/13/2016 91.3  80.0 - 100.0 fL Final  . MCH 02/13/2016 31.8  26.0 - 34.0 pg Final  . MCHC 02/13/2016 34.8  32.0 - 36.0 g/dL Final  . RDW 02/13/2016 14.1  11.5 - 14.5 % Final  . Platelets 02/13/2016 306  150 - 440 K/uL Final  . Neutrophils Relative % 02/13/2016 88  % Final  . Neutro Abs 02/13/2016 11.7* 1.4 - 6.5 K/uL Final  . Lymphocytes Relative 02/13/2016 7  % Final  . Lymphs Abs 02/13/2016 0.9* 1.0 - 3.6 K/uL Final  . Monocytes Relative 02/13/2016 5  % Final  . Monocytes Absolute 02/13/2016 0.7  0.2 - 1.0 K/uL Final  . Eosinophils Relative 02/13/2016 0  % Final  . Eosinophils  Absolute 02/13/2016 0.0  0 - 0.7 K/uL Final  . Basophils Relative 02/13/2016 0  % Final  . Basophils Absolute 02/13/2016 0.0  0 - 0.1 K/uL Final  . Sodium 02/13/2016 135  135 - 145 mmol/L Final  . Potassium 02/13/2016 4.2  3.5 - 5.1 mmol/L Final  . Chloride 02/13/2016 101  101 - 111 mmol/L Final  . CO2 02/13/2016 25  22 - 32 mmol/L Final  . Glucose, Bld 02/13/2016 187* 65 - 99 mg/dL Final  . BUN 02/13/2016 19  6 - 20 mg/dL Final  . Creatinine, Ser 02/13/2016 1.22  0.61 - 1.24 mg/dL Final  . Calcium 02/13/2016 10.0  8.9 - 10.3 mg/dL Final  . Total Protein 02/13/2016 7.8  6.5 - 8.1 g/dL Final  . Albumin 02/13/2016 3.7  3.5 - 5.0 g/dL Final  . AST 02/13/2016 27  15 - 41 U/L Final  . ALT 02/13/2016 30  17 - 63 U/L Final  . Alkaline Phosphatase 02/13/2016 146* 38 - 126 U/L Final  . Total Bilirubin 02/13/2016 0.4  0.3 - 1.2 mg/dL Final  . GFR calc non Af Amer 02/13/2016 >60  >60 mL/min Final  . GFR calc Af Amer 02/13/2016 >60  >60 mL/min Final   Comment: (NOTE) The eGFR has been calculated using the CKD EPI equation. This  calculation has not been validated in all clinical situations. eGFR's persistently <60 mL/min signify possible Chronic Kidney Disease.   . Anion gap 02/13/2016 9  5 - 15 Final  . Magnesium 02/13/2016 1.8  1.7 - 2.4 mg/dL Final    Assessment:  Taytum Wheller is a 55 y.o. male with stage IV adenocarcinoma of the right lung.  He has no smoking history.  He presented with with a large right sided pleural effusion and an ill-defined 4.5 x 4.5 cm mass in the right upper lobe. Thoracentesis x 2 of approximately 4.7 liters of blood fluid revealed adenocarcinoma. TTF-1 and Napsin A were immunoreactive and consistent with a lung primary. Pleur-X catheter was placed on 08/13/2015 (removed 09/18/2015).  [facility] testing on 08/18/2015 was negative for:  EGFR, ALK, ROS1 and RET.  Chest CT angiogram on 08/10/2015 revealed and ill-defined 4.5 x 4.5 cm area of hypodensity in the right upper lobe with small areas of air bronchogram. This was incompletely characterized but is concerning for a centrally obstructing mass. There was complete occlusion of the right upper, right middle, and right lower lobe bronchi with complete collapse of the right lung. There was a large right pleural effusion. There was a lytic lesion involving the right fourth rib compatible with metastatic disease. There was no CT evidence of pulmonary embolism.  PET scan on 08/20/2015 revealed a hypermetabolic 5.2 cm medial right upper lobe lung mass, consistent with primary bronchogenic carcinoma, with a broad attachment to the medial right upper lobe pleura.  There was interlobular septal thickening throughout the right lung suggested a component of lymphangitic tumor.  There was a malignant small right pleural effusion with hypermetabolism throughout the right pleural space.  There was hypermetabolic ipsilateral hilar, subcarinal and ipsilateral mediastinal nodal metastases.  There was extensive hypermetabolic lytic osseous metastases throughout  the axial and proximal appendicular skeleton.    Regarding the skeleton, there were numerous hypermetabolic faintly lytic osseous metastases in the left humeral head (SUV 6.8), right acromion, bilateral ribs most prominent in the anterior right fourth rib  (SUV 16.0), thoracolumbar spine (T11 vertebral body with SUV 9.4), bilateral iliac bones (medial right iliac bone with SUV  12.1 and left anterior acetabulum with SUV 10.0), and right proximal femur (SUV 10.0 in the region of the right lesser trochanter).   Plain films on 08/21/2015 revealed a left humerus lytic lesion with some thinning and some absence of the cortical margin.  He received 800 cGy to the left humerus on 09/17/2015.  CEA was 837.1 on 09/12/2015, 448 on 10/10/2015, 346 on 10/31/2015, 261.3 on 11/21/2015, 726.2 on 01/02/2016, 857.2 on 01/09/2016, 1458.0 on 01/23/2016, 1350.0 on 01/30/2016, and 999.6 on 02/13/2016.  Anemia work-up on 09/04/2015 revealed a ferritin (519), iron saturation (18%), B12 (3372), and folate (21.9).  He received 4 cycles of Keytruda, carboplatin and Alimta (08/22/2015 - 10/31/2015).   He received 3 cycles of single agent Keytruda (11/21/2015 - 01/02/2016).  Pleur-X catheter was removed on 09/18/2015.    He restarted carboplatin and Alimta on 01/23/2016.  He receives monthly Xgeva (began 08/22/2015; last 02/06/2016).   PET scan on 11/20/2015 revealed a marked positive response to therapy.  There was decrease in size and metabolic activity of RIGHT perihilar mass, resolution of mediastinal nodal metabolic activity, and marked decrease in size and metabolic activity of RIGHT anterior chest wall metastasis.  There was decreased in metabolic activity of multiple skeletal metastasis.    PET scan on 01/16/2016 revealed a marked interval progression of disease compared with previous exam.  There was progression of multifocal hypermetabolic bone metastases.  There was the interval enlargement and right lung perihilar  mass with development of pleural spread of tumor and lymphangitic spread of tumor within the right lung.  There was new hypermetabolic and enlarged sub-carinal lymph node.  Symptomatically, he denies any complaint.  Exam reveals a Candidal inguinal rash.  Plan: 1.  Labs today:  CBC with diff, CMP, Mg, CEA. 2.   Discuss patient's thoughts about the addition of Avastin to his chemotherapy.  Clinical trial data re-reviewed.  Side effects reviewed.  Patient would like to receive carboplatin and Alimta today.  He wants to continue to hold off on the Avastin secondary to a concern for side effects.   3.  Cycle #2 carboplatin and Alimta today. 4.  No Avastin today. 5.  Rx:  Nystatin powder topically. 6.  Await [facility] testing for potential future treatment options. 7.  RTC on 03/04/2016 for labs (CBC with diff, CMP, Mg, CEA, urine protein) 8.  RTC on 03/05/2016 for MD assessment, review of labs, Xgeva, carboplatin + Alimta +/- Avastin.   Lequita Asal, MD  02/13/2016, 10:08 AM

## 2016-02-14 ENCOUNTER — Telehealth: Payer: Self-pay | Admitting: Hematology and Oncology

## 2016-02-14 ENCOUNTER — Encounter: Payer: Self-pay | Admitting: Hematology and Oncology

## 2016-02-14 LAB — CEA: CEA: 999.6 ng/mL — ABNORMAL HIGH (ref 0.0–4.7)

## 2016-02-14 NOTE — Telephone Encounter (Signed)
Re:  CEA results  Called to tell patient that CEA has decreased from 1458  to 999.6.  Stuart Asal, MD

## 2016-02-16 ENCOUNTER — Telehealth: Payer: Self-pay | Admitting: *Deleted

## 2016-02-16 ENCOUNTER — Other Ambulatory Visit: Payer: Self-pay | Admitting: *Deleted

## 2016-02-16 MED ORDER — OXYCODONE HCL 5 MG PO TABS: ORAL_TABLET | ORAL | 0 refills | Status: DC

## 2016-02-16 NOTE — Telephone Encounter (Signed)
Called pt to let him know that tumor marker is 999, whic is down from 1350.00 before. He states that his back is bothering him today. He has not had that kind of pain since in May and they gave him oxycodone and he would like a refill.  It is middle of his back. I told him I would check with md and let him know.

## 2016-02-16 NOTE — Progress Notes (Unsigned)
oycodone

## 2016-02-16 NOTE — Telephone Encounter (Signed)
Called pt back and got voicemail and left message that he can have oxycodone and he can pick it up here at cancer center

## 2016-03-04 ENCOUNTER — Inpatient Hospital Stay: Payer: No Typology Code available for payment source

## 2016-03-04 DIAGNOSIS — C3490 Malignant neoplasm of unspecified part of unspecified bronchus or lung: Secondary | ICD-10-CM

## 2016-03-04 DIAGNOSIS — Z5111 Encounter for antineoplastic chemotherapy: Secondary | ICD-10-CM | POA: Diagnosis not present

## 2016-03-04 DIAGNOSIS — C7951 Secondary malignant neoplasm of bone: Secondary | ICD-10-CM

## 2016-03-04 LAB — CBC WITH DIFFERENTIAL/PLATELET
Basophils Absolute: 0 10*3/uL (ref 0–0.1)
Basophils Relative: 1 %
Eosinophils Absolute: 0.1 10*3/uL (ref 0–0.7)
Eosinophils Relative: 3 %
HCT: 39.4 % — ABNORMAL LOW (ref 40.0–52.0)
Hemoglobin: 13.4 g/dL (ref 13.0–18.0)
Lymphocytes Relative: 26 %
Lymphs Abs: 0.9 10*3/uL — ABNORMAL LOW (ref 1.0–3.6)
MCH: 31.6 pg (ref 26.0–34.0)
MCHC: 34.1 g/dL (ref 32.0–36.0)
MCV: 92.6 fL (ref 80.0–100.0)
Monocytes Absolute: 0.4 10*3/uL (ref 0.2–1.0)
Monocytes Relative: 12 %
Neutro Abs: 1.9 10*3/uL (ref 1.4–6.5)
Neutrophils Relative %: 58 %
Platelets: 244 10*3/uL (ref 150–440)
RBC: 4.25 MIL/uL — ABNORMAL LOW (ref 4.40–5.90)
RDW: 16.1 % — ABNORMAL HIGH (ref 11.5–14.5)
WBC: 3.3 10*3/uL — ABNORMAL LOW (ref 3.8–10.6)

## 2016-03-04 LAB — COMPREHENSIVE METABOLIC PANEL
ALT: 40 U/L (ref 17–63)
AST: 34 U/L (ref 15–41)
Albumin: 4.1 g/dL (ref 3.5–5.0)
Alkaline Phosphatase: 123 U/L (ref 38–126)
Anion gap: 9 (ref 5–15)
BUN: 16 mg/dL (ref 6–20)
CO2: 29 mmol/L (ref 22–32)
Calcium: 9.5 mg/dL (ref 8.9–10.3)
Chloride: 100 mmol/L — ABNORMAL LOW (ref 101–111)
Creatinine, Ser: 1.32 mg/dL — ABNORMAL HIGH (ref 0.61–1.24)
GFR calc Af Amer: >60 mL/min (ref >60)
GFR calc non Af Amer: 60 mL/min — ABNORMAL LOW (ref >60)
Glucose, Bld: 186 mg/dL — ABNORMAL HIGH (ref 65–99)
Potassium: 4.2 mmol/L (ref 3.5–5.1)
Sodium: 138 mmol/L (ref 135–145)
Total Bilirubin: 0.5 mg/dL (ref 0.3–1.2)
Total Protein: 7.6 g/dL (ref 6.5–8.1)

## 2016-03-04 LAB — MAGNESIUM: Magnesium: 2 mg/dL (ref 1.7–2.4)

## 2016-03-05 ENCOUNTER — Encounter: Payer: Self-pay | Admitting: Hematology and Oncology

## 2016-03-05 ENCOUNTER — Inpatient Hospital Stay: Payer: PRIVATE HEALTH INSURANCE

## 2016-03-05 ENCOUNTER — Ambulatory Visit
Admission: RE | Admit: 2016-03-05 | Discharge: 2016-03-05 | Disposition: A | Discharge status: Home / Self Care | Payer: No Typology Code available for payment source | Source: Ambulatory Visit | Attending: Hematology and Oncology | Admitting: Hematology and Oncology

## 2016-03-05 ENCOUNTER — Inpatient Hospital Stay: Payer: PRIVATE HEALTH INSURANCE | Attending: Hematology and Oncology | Admitting: Hematology and Oncology

## 2016-03-05 ENCOUNTER — Other Ambulatory Visit: Payer: Self-pay | Admitting: Hematology and Oncology

## 2016-03-05 ENCOUNTER — Other Ambulatory Visit: Payer: Self-pay

## 2016-03-05 VITALS — BP 122/83 | HR 102 | Temp 96.3°F | Resp 18 | Wt 243.6 lb

## 2016-03-05 DIAGNOSIS — Z9981 Dependence on supplemental oxygen: Secondary | ICD-10-CM | POA: Diagnosis not present

## 2016-03-05 DIAGNOSIS — Z5112 Encounter for antineoplastic immunotherapy: Secondary | ICD-10-CM | POA: Insufficient documentation

## 2016-03-05 DIAGNOSIS — Z801 Family history of malignant neoplasm of trachea, bronchus and lung: Secondary | ICD-10-CM | POA: Diagnosis not present

## 2016-03-05 DIAGNOSIS — B356 Tinea cruris: Secondary | ICD-10-CM | POA: Diagnosis not present

## 2016-03-05 DIAGNOSIS — C3491 Malignant neoplasm of unspecified part of right bronchus or lung: Secondary | ICD-10-CM

## 2016-03-05 DIAGNOSIS — J910 Malignant pleural effusion: Secondary | ICD-10-CM | POA: Diagnosis not present

## 2016-03-05 DIAGNOSIS — C3490 Malignant neoplasm of unspecified part of unspecified bronchus or lung: Secondary | ICD-10-CM

## 2016-03-05 DIAGNOSIS — Z5111 Encounter for antineoplastic chemotherapy: Secondary | ICD-10-CM | POA: Diagnosis present

## 2016-03-05 DIAGNOSIS — M109 Gout, unspecified: Secondary | ICD-10-CM | POA: Insufficient documentation

## 2016-03-05 DIAGNOSIS — Z79899 Other long term (current) drug therapy: Secondary | ICD-10-CM | POA: Diagnosis not present

## 2016-03-05 DIAGNOSIS — Z7689 Persons encountering health services in other specified circumstances: Secondary | ICD-10-CM | POA: Insufficient documentation

## 2016-03-05 DIAGNOSIS — R05 Cough: Secondary | ICD-10-CM | POA: Insufficient documentation

## 2016-03-05 DIAGNOSIS — L089 Local infection of the skin and subcutaneous tissue, unspecified: Secondary | ICD-10-CM | POA: Diagnosis not present

## 2016-03-05 DIAGNOSIS — C7951 Secondary malignant neoplasm of bone: Secondary | ICD-10-CM | POA: Diagnosis not present

## 2016-03-05 DIAGNOSIS — C3411 Malignant neoplasm of upper lobe, right bronchus or lung: Secondary | ICD-10-CM | POA: Diagnosis not present

## 2016-03-05 DIAGNOSIS — B3789 Other sites of candidiasis: Secondary | ICD-10-CM | POA: Diagnosis not present

## 2016-03-05 DIAGNOSIS — I10 Essential (primary) hypertension: Secondary | ICD-10-CM | POA: Diagnosis not present

## 2016-03-05 LAB — PROTEIN, URINE, RANDOM: Total Protein, Urine: 37 mg/dL

## 2016-03-05 LAB — CEA: CEA: 870.8 ng/mL — ABNORMAL HIGH (ref 0.0–4.7)

## 2016-03-05 MED ORDER — SODIUM CHLORIDE 0.9 % IV SOLN
1100.0000 mg | Freq: Once | INTRAVENOUS | Status: AC
  Administered 2016-03-05: 1100 mg via INTRAVENOUS
  Filled 2016-03-05: qty 40

## 2016-03-05 MED ORDER — HEPARIN SOD (PORK) LOCK FLUSH 100 UNIT/ML IV SOLN
500.0000 [IU] | Freq: Once | INTRAVENOUS | Status: AC
  Administered 2016-03-05: 500 [IU] via INTRAVENOUS

## 2016-03-05 MED ORDER — DEXAMETHASONE SODIUM PHOSPHATE 10 MG/ML IJ SOLN
10.0000 mg | Freq: Once | INTRAMUSCULAR | Status: AC
  Administered 2016-03-05: 10 mg via INTRAVENOUS
  Filled 2016-03-05: qty 1

## 2016-03-05 MED ORDER — SODIUM CHLORIDE 0.9 % IJ SOLN
10.0000 mL | Freq: Once | INTRAMUSCULAR | Status: AC
  Administered 2016-03-05: 10 mL via INTRAVENOUS
  Filled 2016-03-05: qty 10

## 2016-03-05 MED ORDER — DENOSUMAB 120 MG/1.7ML ~~LOC~~ SOLN
120.0000 mg | Freq: Once | SUBCUTANEOUS | Status: AC
  Administered 2016-03-05: 120 mg via SUBCUTANEOUS
  Filled 2016-03-05 (×2): qty 1.7

## 2016-03-05 MED ORDER — SODIUM CHLORIDE 0.9 % IV SOLN
Freq: Once | INTRAVENOUS | Status: AC
  Filled 2016-03-05: qty 1000

## 2016-03-05 MED ORDER — CYANOCOBALAMIN 1000 MCG/ML IJ SOLN
1000.0000 ug | Freq: Once | INTRAMUSCULAR | Status: AC
  Administered 2016-03-05: 1000 ug via INTRAMUSCULAR
  Filled 2016-03-05: qty 1

## 2016-03-05 MED ORDER — SODIUM CHLORIDE 0.9 % IV SOLN
670.0000 mg | Freq: Once | INTRAVENOUS | Status: AC
  Administered 2016-03-05: 670 mg via INTRAVENOUS
  Filled 2016-03-05: qty 67

## 2016-03-05 MED ORDER — HEPARIN SOD (PORK) LOCK FLUSH 100 UNIT/ML IV SOLN
INTRAVENOUS | Status: AC
  Filled 2016-03-05: qty 5

## 2016-03-05 MED ORDER — BEVACIZUMAB CHEMO INJECTION 400 MG/16ML
1600.0000 mg | Freq: Once | INTRAVENOUS | Status: AC
  Administered 2016-03-05: 1600 mg via INTRAVENOUS
  Filled 2016-03-05: qty 64

## 2016-03-05 MED ORDER — SODIUM CHLORIDE 0.9 % IV SOLN
Freq: Once | INTRAVENOUS | Status: AC
  Administered 2016-03-05: 12:00:00 via INTRAVENOUS
  Filled 2016-03-05: qty 1000

## 2016-03-05 MED ORDER — PEGFILGRASTIM 6 MG/0.6ML ~~LOC~~ PSKT
6.0000 mg | PREFILLED_SYRINGE | Freq: Once | SUBCUTANEOUS | Status: AC
  Administered 2016-03-05: 6 mg via SUBCUTANEOUS
  Filled 2016-03-05: qty 0.6

## 2016-03-05 MED ORDER — PALONOSETRON HCL INJECTION 0.25 MG/5ML
0.2500 mg | Freq: Once | INTRAVENOUS | Status: AC
  Administered 2016-03-05: 0.25 mg via INTRAVENOUS
  Filled 2016-03-05: qty 5

## 2016-03-05 MED ORDER — SODIUM CHLORIDE 0.9 % IV SOLN: 10.0000 mg | Freq: Once | INTRAVENOUS | Status: DC

## 2016-03-05 NOTE — Progress Notes (Addendum)
Stuart Spence Clinic day:  03/05/2016   Chief Complaint: Stuart Spence is a 55 y.o. male with metastatic lung cancer who is seen for assessment prior to cycle #3 carboplatin and Alimta +/- Avastin.  HPI:  The patient was last seen in the medical oncology clinic on 02/13/2016.  At that time,  he denied any complaint.  Exam revealed a Candidal inguinal rash.  He received cycle #2 carboplatin and Alimta.  He declined Avastin.  CEA was 999.6 (improved).  He received Xgeva on 02/06/2016.  During the interim, he has felt fine.  His wife notes that he has been coughing some.  He has had no fever.  He ate more yesterday secondary to taking his steroids. His wife is concerned that he is sleeping a lot.   Past Medical History:  Diagnosis Date  . Cancer ([place])    Lung  . Depression   . ED (erectile dysfunction)   . Gout   . Hypertension   . On home oxygen therapy   . Personal history of chemotherapy    PT TAKING CHEMO EVERY 3 WEEKS    Past Surgical History:  Procedure Laterality Date  . CHEST TUBE INSERTION Left 08/13/2015   Procedure: CHEST TUBE INSERTION;  Surgeon: Nestor Lewandowsky, MD;  Location: [facility] ORS;  Service: General;  Laterality: Left;  . CHEST TUBE INSERTION N/A 09/18/2015   Procedure: PLEURX CATH REMOVAL;  Surgeon: Nestor Lewandowsky, MD;  Location: [facility] ORS;  Service: Thoracic;  Laterality: N/A;  . PORTACATH PLACEMENT Left 08/13/2015   Procedure: INSERTION PORT-A-CATH;  Surgeon: Nestor Lewandowsky, MD;  Location: [facility] ORS;  Service: General;  Laterality: Left;  . PORTACATH PLACEMENT    . TONSILLECTOMY      Family History  Problem Relation Age of Onset  . Cancer Mother 70    lung  . Heart attack Father 57  . Hypertension Father   . Congestive Heart Failure Father 9    died from  . Diabetes Sister     Social History:  reports that he has never smoked. He has never used smokeless tobacco. He reports that he does not drink alcohol or use drugs.  He has  worked 50 years in the [employer].He notes exposure to chemicals.   He recently switched jobs.  He is not working.  He had previously planned on helping to build 2 houses.  Patient's wife name is [name].  He is accompanied by his wife today.  Allergies: No Known Allergies  Current Medications: Current Outpatient Prescriptions  Medication Sig Dispense Refill  . albuterol (PROVENTIL HFA;VENTOLIN HFA) 108 (90 Base) MCG/ACT inhaler Inhale 1-2 puffs into the lungs See admin instructions. Reported on 09/12/2015    . amLODipine (NORVASC) 5 MG tablet Take 1 tablet (5 mg total) by mouth daily. 30 tablet 6  . Calcium Carbonate (CALCIUM 600 PO) Take 1,200 mg by mouth 2 (two) times daily.    . colchicine 0.6 MG tablet Take 0.6 mg by mouth daily as needed (two pills by mouth at onset, the one pill one hour later, maximum three pills per gout flare).    Marland Kitchen dexamethasone (DECADRON) 4 MG tablet 4 mg tablet twice a day the day before chemo, and the day after chemo with each regimen of carbo/alimta 36 tablet 0  . FLUoxetine (PROZAC) 20 MG capsule Take 2 capsules (40 mg total) by mouth daily. (Patient taking differently: Take 40 mg by mouth every morning. ) 60 capsule 6  .  folic acid (FOLVITE) 1 MG tablet Take 1 tablet (1 mg total) by mouth daily. 30 tablet 3  . lidocaine-prilocaine (EMLA) cream Apply 1 application topically as needed. 30 g 2  . nystatin (NYSTATIN) powder Apply topically 3 (three) times daily as needed. 15 g 1  . ondansetron (ZOFRAN) 8 MG tablet Take 1 tablet (8 mg total) by mouth every 8 (eight) hours as needed for nausea or vomiting. 20 tablet 3  . oxyCODONE (OXY IR/ROXICODONE) 5 MG immediate release tablet 1 tablet every 4-6 hours as need for pain 30 tablet 0  . OXYGEN Inhale 2 L into the lungs continuous. Reported on 09/12/2015    . sildenafil (VIAGRA) 100 MG tablet Take 1 tablet (100 mg total) by mouth daily as needed for erectile dysfunction. 10 tablet 12  . telmisartan (MICARDIS) 40 MG  tablet Take 1 tablet (40 mg total) by mouth daily. 30 tablet 6   No current facility-administered medications for this visit.     Review of Systems:  GENERAL: Feels good. Sleeping a lot per his wife.  No fevers or sweats.  Active. Weight up 3 pounds. PERFORMANCE STATUS (ECOG): 1 HEENT: No visual changes, runny nose, sore throat, mouth sores or tenderness. Lungs: No shortness of breath. Cough. No hemoptysis.   Cardiac: No chest pain, palpitations, orthopnea, or PND. GI: No nausea, vomiting, diarrhea, constipation, melena or hematochezia. No prior colonoscopy. GU: No urgency, frequency, dysuria, or hematuria.  Musculoskeletal: No back pain. No joint pain. No muscle tenderness. Extremities: No pain or swelling. Skin: No rashes or ulcers. Neuro: No headache, numbness or weakness, balance or coordination issues. Endocrine: No diabetes, thyroid issues, hot flashes or night sweats. Psych: No mood changes, depression or anxiety. Pain: No focal pain. Review of systems: All other systems reviewed and found to be negative.  Physical Exam: Blood pressure 122/83, pulse (!) 102, temperature (!) 96.3 F (35.7 C), temperature source Tympanic, resp. rate 18, weight 243 lb 9.7 oz (110.5 kg). GENERAL: Well developed, well nourished, heavyset gentleman sitting in the exam room in no acute distress. MENTAL STATUS: Alert and oriented to person, place and time. HEAD: Short dark hair. Goatee. Normocephalic, atraumatic, face symmetric, no Cushingoid features. EYES: Oval glasses. Pupils equal round and reactive to light and accomodation. No conjunctivitis or scleral icterus. ENT: Oropharynx clear without lesion. Tongue normal. Mucous membranes moist.  RESPIRATORY: Decreased breath sounds right base (stable).  Otherwise, clear to auscultation without rales, wheezes or rhonchi. CARDIOVASCULAR: Regular rate and rhythm without murmur, rub or gallop. ABDOMEN: Soft, non-tender,  with active bowel sounds, and no hepatosplenomegaly. No masses. SKIN: Candidal rash in right inguinal area. EXTREMITIES: Mild chronic lower extremity changes.  No skin discoloration or tenderness. No palpable cords. LYMPH NODES: No palpable cervical, supraclavicular, axillary or inguinal adenopathy  NEUROLOGICAL: Unremarkable. PSYCH: Appropriate.   Appointment on 03/04/2016  Component Date Value Ref Range Status  . WBC 03/04/2016 3.3* 3.8 - 10.6 K/uL Final  . RBC 03/04/2016 4.25* 4.40 - 5.90 MIL/uL Final  . Hemoglobin 03/04/2016 13.4  13.0 - 18.0 g/dL Final  . HCT 03/04/2016 39.4* 40.0 - 52.0 % Final  . MCV 03/04/2016 92.6  80.0 - 100.0 fL Final  . MCH 03/04/2016 31.6  26.0 - 34.0 pg Final  . MCHC 03/04/2016 34.1  32.0 - 36.0 g/dL Final  . RDW 03/04/2016 16.1* 11.5 - 14.5 % Final  . Platelets 03/04/2016 244  150 - 440 K/uL Final  . Neutrophils Relative % 03/04/2016 58  % Final  .  Neutro Abs 03/04/2016 1.9  1.4 - 6.5 K/uL Final  . Lymphocytes Relative 03/04/2016 26  % Final  . Lymphs Abs 03/04/2016 0.9* 1.0 - 3.6 K/uL Final  . Monocytes Relative 03/04/2016 12  % Final  . Monocytes Absolute 03/04/2016 0.4  0.2 - 1.0 K/uL Final  . Eosinophils Relative 03/04/2016 3  % Final  . Eosinophils Absolute 03/04/2016 0.1  0 - 0.7 K/uL Final  . Basophils Relative 03/04/2016 1  % Final  . Basophils Absolute 03/04/2016 0.0  0 - 0.1 K/uL Final  . Sodium 03/04/2016 138  135 - 145 mmol/L Final  . Potassium 03/04/2016 4.2  3.5 - 5.1 mmol/L Final  . Chloride 03/04/2016 100* 101 - 111 mmol/L Final  . CO2 03/04/2016 29  22 - 32 mmol/L Final  . Glucose, Bld 03/04/2016 186* 65 - 99 mg/dL Final  . BUN 03/04/2016 16  6 - 20 mg/dL Final  . Creatinine, Ser 03/04/2016 1.32* 0.61 - 1.24 mg/dL Final  . Calcium 03/04/2016 9.5  8.9 - 10.3 mg/dL Final  . Total Protein 03/04/2016 7.6  6.5 - 8.1 g/dL Final  . Albumin 03/04/2016 4.1  3.5 - 5.0 g/dL Final  . AST 03/04/2016 34  15 - 41 U/L Final  . ALT  03/04/2016 40  17 - 63 U/L Final  . Alkaline Phosphatase 03/04/2016 123  38 - 126 U/L Final  . Total Bilirubin 03/04/2016 0.5  0.3 - 1.2 mg/dL Final  . GFR calc non Af Amer 03/04/2016 60* >60 mL/min Final  . GFR calc Af Amer 03/04/2016 >60  >60 mL/min Final   Comment: (NOTE) The eGFR has been calculated using the CKD EPI equation. This calculation has not been validated in all clinical situations. eGFR's persistently <60 mL/min signify possible Chronic Kidney Disease.   . Anion gap 03/04/2016 9  5 - 15 Final  . Magnesium 03/04/2016 2.0  1.7 - 2.4 mg/dL Final  . CEA 03/05/2016 870.8* 0.0 - 4.7 ng/mL Final   Comment: (NOTE)       Roche ECLIA methodology       Nonsmokers  <3.9                                     Smokers     <5.6 Performed At: [facility] [address] [place], [place] [id number] Lindon Romp MD OV:[id number]     Assessment:  Stuart Spence is a 55 y.o. male with stage IV adenocarcinoma of the right lung.  He has no smoking history.  He presented with with a large right sided pleural effusion and an ill-defined 4.5 x 4.5 cm mass in the right upper lobe. Thoracentesis x 2 of approximately 4.7 liters of blood fluid revealed adenocarcinoma. TTF-1 and Napsin A were immunoreactive and consistent with a lung primary. Pleur-X catheter was placed on 08/13/2015 (removed 09/18/2015).  [facility] testing on 08/18/2015 was negative for:  EGFR, ALK, ROS1 and RET.  Chest CT angiogram on 08/10/2015 revealed and ill-defined 4.5 x 4.5 cm area of hypodensity in the right upper lobe with small areas of air bronchogram. This was incompletely characterized but is concerning for a centrally obstructing mass. There was complete occlusion of the right upper, right middle, and right lower lobe bronchi with complete collapse of the right lung. There was a large right pleural effusion. There was a lytic lesion involving the right fourth rib compatible with metastatic disease. There  was no  CT evidence of pulmonary embolism.  PET scan on 08/20/2015 revealed a hypermetabolic 5.2 cm medial right upper lobe lung mass, consistent with primary bronchogenic carcinoma, with a broad attachment to the medial right upper lobe pleura.  There was interlobular septal thickening throughout the right lung suggested a component of lymphangitic tumor.  There was a malignant small right pleural effusion with hypermetabolism throughout the right pleural space.  There was hypermetabolic ipsilateral hilar, subcarinal and ipsilateral mediastinal nodal metastases.  There was extensive hypermetabolic lytic osseous metastases throughout the axial and proximal appendicular skeleton.    Regarding the skeleton, there were numerous hypermetabolic faintly lytic osseous metastases in the left humeral head (SUV 6.8), right acromion, bilateral ribs most prominent in the anterior right fourth rib  (SUV 16.0), thoracolumbar spine (T11 vertebral body with SUV 9.4), bilateral iliac bones (medial right iliac bone with SUV 12.1 and left anterior acetabulum with SUV 10.0), and right proximal femur (SUV 10.0 in the region of the right lesser trochanter).   Plain films on 08/21/2015 revealed a left humerus lytic lesion with some thinning and some absence of the cortical margin.  He received 800 cGy to the left humerus on 09/17/2015.  CEA was 837.1 on 09/12/2015, 448 on 10/10/2015, 346 on 10/31/2015, 261.3 on 11/21/2015, 726.2 on 01/02/2016, 857.2 on 01/09/2016, 1458.0 on 01/23/2016, 1350.0 on 01/30/2016, 999.6 on 02/13/2016, and 870.8 on 03/04/2016.  Anemia work-up on 09/04/2015 revealed a ferritin (519), iron saturation (18%), B12 (3372), and folate (21.9).  He received 4 cycles of Keytruda, carboplatin and Alimta (08/22/2015 - 10/31/2015).   He received 3 cycles of single agent Keytruda (11/21/2015 - 01/02/2016).  Pleur-X catheter was removed on 09/18/2015.    He has received 2 additional cycles of carboplatin and Alimta  (01/23/2016 - 02/13/2016).  He receives B12 every 9 weeks (last 01/19/2016).  He receives monthly Xgeva (began 08/22/2015; last 02/06/2016).   PET scan on 11/20/2015 revealed a marked positive response to therapy.  There was decrease in size and metabolic activity of RIGHT perihilar mass, resolution of mediastinal nodal metabolic activity, and marked decrease in size and metabolic activity of RIGHT anterior chest wall metastasis.  There was decreased in metabolic activity of multiple skeletal metastasis.    PET scan on 01/16/2016 revealed a marked interval progression of disease compared with previous exam.  There was progression of multifocal hypermetabolic bone metastases.  There was the interval enlargement and right lung perihilar mass with development of pleural spread of tumor and lymphangitic spread of tumor within the right lung.  There was new hypermetabolic and enlarged sub-carinal lymph node.  Symptomatically, he denies any complaint.  He has a cough.  Exam reveals stable decreased breath sounds in the right lung base.  Plan: 1.  Review labs from yesterday.  Discuss declining CEA. 2.  Discuss patient's thoughts about the addition of Avastin to his chemotherapy.  Clinical trial data re-reviewed.  Side effects reviewed.  Patient would like to receive carboplatin and Alimta today.  His wife would like him to receive Avastin.  After some discussion, they decided to proceed with the addition of Avastin.   3.  Discuss baseline counts at start of chemotherapy.  Discuss addition of OnPro Neulasta.  Side effects reviewed.  Discuss use of Claritin to prevent Neulasta induced bone pain. 4.  CXR (PA and lateral) prior to treatment today. (Patient's wife concerned about his cough). 5.  B12 today. 6.  Cycle #3 carboplatin and Alimta + Alimta today. 7.  Xgeva today. 8.  Follow-up [facility] testing for potential future treatment options. 9.  RTC in 3 weeks for MD assessment, labs (CBC with diff,  CMP, Mg, CEA, random urine protein), cycle #4 carboplatin, Alimta, and Avastin.  Addendum:  CXR reveals no detectable change compared to 01/15/2016 PET scan.  There was no drainable fluid.    [facility] testing revealed ERBB2  Amplification equivocal, RICTOR amplification, APC [id number], CDKN2A/B loss, microsatellite stable, tumor mutation burden intermediate (6 Muts/Mb).  EGFR, K-RAS, ALK, BRAF, MET and RET were negative.   Lequita Asal, MD  03/05/2016, 9:01 AM

## 2016-03-05 NOTE — Progress Notes (Signed)
Patient was constipated after last treatment and he thought it may be due to the high dose of calcium so he decreased to '1200mg'$  1QAM until constipation resolved then went back to '1200mg'$  BID.

## 2016-03-16 ENCOUNTER — Other Ambulatory Visit: Payer: Self-pay | Admitting: *Deleted

## 2016-03-16 MED ORDER — OXYCODONE HCL 5 MG PO TABS: ORAL_TABLET | ORAL | 0 refills | Status: DC

## 2016-03-23 NOTE — Addendum Note (Signed)
Addended by: Nolon Stalls C on: 03/23/2016 01:50 PM   Modules accepted: Orders

## 2016-03-25 ENCOUNTER — Inpatient Hospital Stay: Payer: PRIVATE HEALTH INSURANCE

## 2016-03-25 DIAGNOSIS — Z5111 Encounter for antineoplastic chemotherapy: Secondary | ICD-10-CM | POA: Diagnosis not present

## 2016-03-25 DIAGNOSIS — C7951 Secondary malignant neoplasm of bone: Secondary | ICD-10-CM

## 2016-03-25 DIAGNOSIS — C3490 Malignant neoplasm of unspecified part of unspecified bronchus or lung: Secondary | ICD-10-CM

## 2016-03-25 LAB — CBC WITH DIFFERENTIAL/PLATELET
Basophils Absolute: 0.1 10*3/uL (ref 0–0.1)
Basophils Relative: 1 %
Eosinophils Absolute: 0.1 10*3/uL (ref 0–0.7)
Eosinophils Relative: 1 %
HCT: 37.5 % — ABNORMAL LOW (ref 40.0–52.0)
Hemoglobin: 12.8 g/dL — ABNORMAL LOW (ref 13.0–18.0)
Lymphocytes Relative: 9 %
Lymphs Abs: 1.1 10*3/uL (ref 1.0–3.6)
MCH: 31.6 pg (ref 26.0–34.0)
MCHC: 34 g/dL (ref 32.0–36.0)
MCV: 93 fL (ref 80.0–100.0)
Monocytes Absolute: 0.5 10*3/uL (ref 0.2–1.0)
Monocytes Relative: 4 %
Neutro Abs: 10.7 10*3/uL — ABNORMAL HIGH (ref 1.4–6.5)
Neutrophils Relative %: 85 %
Platelets: 202 10*3/uL (ref 150–440)
RBC: 4.04 MIL/uL — ABNORMAL LOW (ref 4.40–5.90)
RDW: 17.4 % — ABNORMAL HIGH (ref 11.5–14.5)
WBC: 12.4 10*3/uL — ABNORMAL HIGH (ref 3.8–10.6)

## 2016-03-25 LAB — COMPREHENSIVE METABOLIC PANEL
ALT: 28 U/L (ref 17–63)
AST: 29 U/L (ref 15–41)
Albumin: 4 g/dL (ref 3.5–5.0)
Alkaline Phosphatase: 109 U/L (ref 38–126)
Anion gap: 6 (ref 5–15)
BUN: 19 mg/dL (ref 6–20)
CO2: 28 mmol/L (ref 22–32)
Calcium: 9.2 mg/dL (ref 8.9–10.3)
Chloride: 101 mmol/L (ref 101–111)
Creatinine, Ser: 1.16 mg/dL (ref 0.61–1.24)
GFR calc Af Amer: >60 mL/min (ref >60)
GFR calc non Af Amer: >60 mL/min (ref >60)
Glucose, Bld: 118 mg/dL — ABNORMAL HIGH (ref 65–99)
Potassium: 4.5 mmol/L (ref 3.5–5.1)
Sodium: 135 mmol/L (ref 135–145)
Total Bilirubin: 0.5 mg/dL (ref 0.3–1.2)
Total Protein: 7.7 g/dL (ref 6.5–8.1)

## 2016-03-25 LAB — MAGNESIUM: Magnesium: 2.1 mg/dL (ref 1.7–2.4)

## 2016-03-25 LAB — PROTEIN, URINE, RANDOM: Total Protein, Urine: 15 mg/dL

## 2016-03-26 ENCOUNTER — Other Ambulatory Visit: Payer: Self-pay | Admitting: *Deleted

## 2016-03-26 ENCOUNTER — Ambulatory Visit: Payer: No Typology Code available for payment source

## 2016-03-26 ENCOUNTER — Inpatient Hospital Stay: Payer: PRIVATE HEALTH INSURANCE

## 2016-03-26 ENCOUNTER — Other Ambulatory Visit: Payer: Self-pay | Admitting: Hematology and Oncology

## 2016-03-26 ENCOUNTER — Other Ambulatory Visit: Payer: No Typology Code available for payment source

## 2016-03-26 ENCOUNTER — Inpatient Hospital Stay (HOSPITAL_BASED_OUTPATIENT_CLINIC_OR_DEPARTMENT_OTHER): Payer: PRIVATE HEALTH INSURANCE | Admitting: Oncology

## 2016-03-26 VITALS — BP 128/82 | HR 93 | Temp 97.8°F | Resp 18 | Wt 247.6 lb

## 2016-03-26 DIAGNOSIS — B356 Tinea cruris: Secondary | ICD-10-CM

## 2016-03-26 DIAGNOSIS — C3411 Malignant neoplasm of upper lobe, right bronchus or lung: Secondary | ICD-10-CM | POA: Diagnosis not present

## 2016-03-26 DIAGNOSIS — M109 Gout, unspecified: Secondary | ICD-10-CM

## 2016-03-26 DIAGNOSIS — C7951 Secondary malignant neoplasm of bone: Secondary | ICD-10-CM | POA: Diagnosis not present

## 2016-03-26 DIAGNOSIS — J910 Malignant pleural effusion: Secondary | ICD-10-CM

## 2016-03-26 DIAGNOSIS — Z79899 Other long term (current) drug therapy: Secondary | ICD-10-CM

## 2016-03-26 DIAGNOSIS — C3491 Malignant neoplasm of unspecified part of right bronchus or lung: Secondary | ICD-10-CM

## 2016-03-26 DIAGNOSIS — L089 Local infection of the skin and subcutaneous tissue, unspecified: Secondary | ICD-10-CM

## 2016-03-26 DIAGNOSIS — I10 Essential (primary) hypertension: Secondary | ICD-10-CM

## 2016-03-26 DIAGNOSIS — C3490 Malignant neoplasm of unspecified part of unspecified bronchus or lung: Secondary | ICD-10-CM

## 2016-03-26 DIAGNOSIS — Z801 Family history of malignant neoplasm of trachea, bronchus and lung: Secondary | ICD-10-CM

## 2016-03-26 DIAGNOSIS — Z5111 Encounter for antineoplastic chemotherapy: Secondary | ICD-10-CM | POA: Diagnosis not present

## 2016-03-26 DIAGNOSIS — Z9981 Dependence on supplemental oxygen: Secondary | ICD-10-CM | POA: Diagnosis not present

## 2016-03-26 LAB — CEA: CEA: 671.9 ng/mL — ABNORMAL HIGH (ref 0.0–4.7)

## 2016-03-26 MED ORDER — NYSTATIN 100000 UNIT/GM EX POWD: Freq: Three times a day (TID) | CUTANEOUS | 1 refills | Status: DC | PRN

## 2016-03-26 MED ORDER — SODIUM CHLORIDE 0.9 % IV SOLN
Freq: Once | INTRAVENOUS | Status: AC
  Administered 2016-03-26: 10:00:00 via INTRAVENOUS
  Filled 2016-03-26: qty 1000

## 2016-03-26 MED ORDER — BEVACIZUMAB CHEMO INJECTION 400 MG/16ML
1600.0000 mg | Freq: Once | INTRAVENOUS | Status: AC
  Administered 2016-03-26: 1600 mg via INTRAVENOUS
  Filled 2016-03-26: qty 32

## 2016-03-26 MED ORDER — DEXAMETHASONE SODIUM PHOSPHATE 100 MG/10ML IJ SOLN: 10.0000 mg | Freq: Once | INTRAMUSCULAR | Status: DC

## 2016-03-26 MED ORDER — SODIUM CHLORIDE 0.9 % IV SOLN
670.0000 mg | Freq: Once | INTRAVENOUS | Status: AC
  Administered 2016-03-26: 670 mg via INTRAVENOUS
  Filled 2016-03-26: qty 67

## 2016-03-26 MED ORDER — DEXAMETHASONE SODIUM PHOSPHATE 10 MG/ML IJ SOLN
10.0000 mg | Freq: Once | INTRAMUSCULAR | Status: AC
  Administered 2016-03-26: 10 mg via INTRAVENOUS
  Filled 2016-03-26: qty 1

## 2016-03-26 MED ORDER — OXYCODONE HCL 5 MG PO TABS: ORAL_TABLET | ORAL | 0 refills | Status: DC

## 2016-03-26 MED ORDER — SODIUM CHLORIDE 0.9 % IV SOLN
1100.0000 mg | Freq: Once | INTRAVENOUS | Status: AC
  Administered 2016-03-26: 1100 mg via INTRAVENOUS
  Filled 2016-03-26: qty 4

## 2016-03-26 MED ORDER — HEPARIN SOD (PORK) LOCK FLUSH 100 UNIT/ML IV SOLN
500.0000 [IU] | Freq: Once | INTRAVENOUS | Status: AC | PRN
  Administered 2016-03-26: 500 [IU]
  Filled 2016-03-26: qty 5

## 2016-03-26 MED ORDER — PALONOSETRON HCL INJECTION 0.25 MG/5ML
0.2500 mg | Freq: Once | INTRAVENOUS | Status: AC
  Administered 2016-03-26: 0.25 mg via INTRAVENOUS
  Filled 2016-03-26: qty 5

## 2016-03-26 NOTE — Progress Notes (Signed)
Hematology/Oncology Consult note [facility]  [phone] [phone]  Patient Care Team: Guadalupe Maple, MD as PCP - General (Family Medicine) Nestor Lewandowsky, MD as Referring Physician (Cardiothoracic Surgery) Laverle Hobby, MD as Consulting Physician (Pulmonary Disease)   Name of the patient: Stuart Spence  [id number]  03/07/1961   Date of visit: 03/26/16  Diagnosis- stage IV lung cancer  Chief complaint/ Reason for visit- routine on treatment assessment of stage IV lung cancer on chemotherapy with carboplatin and Alimta and Avastin  Heme/Onc history: Stuart Spence is a 55 y.o. male with stage IV adenocarcinoma of the right lung.  He has no smoking history.  He presented with with a large right sided pleural effusion and an ill-defined 4.5 x 4.5 cm mass in the right upper lobe. Thoracentesis x 2 of approximately 4.7 liters of blood fluid revealed adenocarcinoma. TTF-1 and Napsin A were immunoreactive and consistent with a lung primary. Pleur-X catheter was placed on 08/13/2015 (removed 09/18/2015).  [facility] testing on 08/18/2015 was negative for:  EGFR, ALK, ROS1 and RET.  Chest CT angiogram on 08/10/2015 revealed and ill-defined 4.5 x 4.5 cm area of hypodensity in the right upper lobe with small areas of air bronchogram. This was incompletely characterized but is concerning for a centrally obstructing mass. There was complete occlusion of the right upper, right middle, and right lower lobe bronchi with complete collapse of the right lung. There was a large right pleural effusion. There was a lytic lesion involving the right fourth rib compatible with metastatic disease. There was no CT evidence of pulmonary embolism.  PET scan on 08/20/2015 revealed a hypermetabolic 5.2 cm medial right upper lobe lung mass, consistent with primary bronchogenic carcinoma, with a broad attachment to the medial right upper lobe pleura.  There was  interlobular septal thickening throughout the right lung suggested a component of lymphangitic tumor.  There was a malignant small right pleural effusion with hypermetabolism throughout the right pleural space.  There was hypermetabolic ipsilateral hilar, subcarinal and ipsilateral mediastinal nodal metastases.  There was extensive hypermetabolic lytic osseous metastases throughout the axial and proximal appendicular skeleton.    Regarding the skeleton, there were numerous hypermetabolic faintly lytic osseous metastases in the left humeral head (SUV 6.8), right acromion, bilateral ribs most prominent in the anterior right fourth rib  (SUV 16.0), thoracolumbar spine (T11 vertebral body with SUV 9.4), bilateral iliac bones (medial right iliac bone with SUV 12.1 and left anterior acetabulum with SUV 10.0), and right proximal femur (SUV 10.0 in the region of the right lesser trochanter).   Plain films on 08/21/2015 revealed a left humerus lytic lesion with some thinning and some absence of the cortical margin.  He received 800 cGy to the left humerus on 09/17/2015.  CEA was 837.1 on 09/12/2015, 448 on 10/10/2015, 346 on 10/31/2015, 261.3 on 11/21/2015, 726.2 on 01/02/2016, 857.2 on 01/09/2016, 1458.0 on 01/23/2016, 1350.0 on 01/30/2016, 999.6 on 02/13/2016, and 870.8 on 03/04/2016.  Anemia work-up on 09/04/2015 revealed a ferritin (519), iron saturation (18%), B12 (3372), and folate (21.9).  He received 4 cycles of Keytruda, carboplatin and Alimta (08/22/2015 - 10/31/2015).   He received 3 cycles of single agent Keytruda (11/21/2015 - 01/02/2016).  Pleur-X catheter was removed on 09/18/2015.    He has received 2 additional cycles of carboplatin and Alimta (01/23/2016 - 02/13/2016).  He receives B12 every 9 weeks (last 01/19/2016).  He receives monthly Xgeva (began 08/22/2015; last 02/06/2016).   PET scan on 11/20/2015  revealed a marked positive response to therapy.  There was decrease in size and  metabolic activity of RIGHT perihilar mass, resolution of mediastinal nodal metabolic activity, and marked decrease in size and metabolic activity of RIGHT anterior chest wall metastasis.  There was decreased in metabolic activity of multiple skeletal metastasis.    PET scan on 01/16/2016 revealed a marked interval progression of disease compared with previous exam.  There was progression of multifocal hypermetabolic bone metastases.  There was the interval enlargement and right lung perihilar mass with development of pleural spread of tumor and lymphangitic spread of tumor within the right lung.  There was new hypermetabolic and enlarged sub-carinal lymph node.  Patient was switched to [place] [facility] testing revealed ERBB2  Amplification equivocal, RICTOR amplification, APC A2122_C2123insA, CDKN2A/B loss, microsatellite stable, tumor mutation burden intermediate (6 Muts/Mb).  EGFR, K-RAS, ALK, BRAF, MET and RET were negative  Interval history- overall he is tolerating his chemotherapy well. He reports having developed a pustule on his forehead which is gradually healing. Denies any fevers chills or other complaints  ECOG PS- 1 Pain scale- 0 Opioid associated constipation- no  Review of systems- Review of Systems  Constitutional: Negative for chills, fever, malaise/fatigue and weight loss.  HENT: Negative for congestion, ear discharge and nosebleeds.   Eyes: Negative for blurred vision.  Respiratory: Negative for cough, hemoptysis, sputum production, shortness of breath and wheezing.   Cardiovascular: Negative for chest pain, palpitations, orthopnea and claudication.  Gastrointestinal: Negative for abdominal pain, blood in stool, constipation, diarrhea, heartburn, melena, nausea and vomiting.  Genitourinary: Negative for dysuria, flank pain, frequency, hematuria and urgency.  Musculoskeletal: Negative for back pain, joint pain and myalgias.  Skin: Negative for  rash.  Neurological: Negative for dizziness, tingling, focal weakness, seizures, weakness and headaches.  Endo/Heme/Allergies: Does not bruise/bleed easily.  Psychiatric/Behavioral: Negative for depression and suicidal ideas. The patient does not have insomnia.      Current treatment- status post 3 cycles of carbo Alimta and Avastin along with xgeva No Known Allergies   Past Medical History:  Diagnosis Date  . Cancer ([place])    Lung  . Depression   . ED (erectile dysfunction)   . Gout   . Hypertension   . On home oxygen therapy   . Personal history of chemotherapy    PT TAKING CHEMO EVERY 3 WEEKS     Past Surgical History:  Procedure Laterality Date  . CHEST TUBE INSERTION Left 08/13/2015   Procedure: CHEST TUBE INSERTION;  Surgeon: Nestor Lewandowsky, MD;  Location: [facility] ORS;  Service: General;  Laterality: Left;  . CHEST TUBE INSERTION N/A 09/18/2015   Procedure: PLEURX CATH REMOVAL;  Surgeon: Nestor Lewandowsky, MD;  Location: [facility] ORS;  Service: Thoracic;  Laterality: N/A;  . PORTACATH PLACEMENT Left 08/13/2015   Procedure: INSERTION PORT-A-CATH;  Surgeon: Nestor Lewandowsky, MD;  Location: [facility] ORS;  Service: General;  Laterality: Left;  . PORTACATH PLACEMENT    . TONSILLECTOMY      Social History   Social History  . Marital status: Married    Spouse name: N/A  . Number of children: N/A  . Years of education: N/A   Occupational History  . Not on file.   Social History Main Topics  . Smoking status: Never Smoker  . Smokeless tobacco: Never Used  . Alcohol use No     Comment: gave it up in August 2016  . Drug use: No  . Sexual activity: Not on  file   Other Topics Concern  . Not on file   Social History Narrative  . No narrative on file    Family History  Problem Relation Age of Onset  . Cancer Mother 10    lung  . Heart attack Father 24  . Hypertension Father   . Congestive Heart Failure Father 42    died from  . Diabetes Sister      Current Outpatient  Prescriptions:  .  albuterol (PROVENTIL HFA;VENTOLIN HFA) 108 (90 Base) MCG/ACT inhaler, Inhale 1-2 puffs into the lungs See admin instructions. Reported on 09/12/2015, Disp: , Rfl:  .  amLODipine (NORVASC) 5 MG tablet, Take 1 tablet (5 mg total) by mouth daily., Disp: 30 tablet, Rfl: 6 .  Calcium Carbonate (CALCIUM 600 PO), Take 1,200 mg by mouth 2 (two) times daily., Disp: , Rfl:  .  colchicine 0.6 MG tablet, Take 0.6 mg by mouth daily as needed (two pills by mouth at onset, the one pill one hour later, maximum three pills per gout flare)., Disp: , Rfl:  .  dexamethasone (DECADRON) 4 MG tablet, 4 mg tablet twice a day the day before chemo, and the day after chemo with each regimen of carbo/alimta, Disp: 36 tablet, Rfl: 0 .  FLUoxetine (PROZAC) 20 MG capsule, Take 2 capsules (40 mg total) by mouth daily. (Patient taking differently: Take 40 mg by mouth every morning. ), Disp: 60 capsule, Rfl: 6 .  folic acid (FOLVITE) 1 MG tablet, Take 1 tablet (1 mg total) by mouth daily., Disp: 30 tablet, Rfl: 3 .  lidocaine-prilocaine (EMLA) cream, Apply 1 application topically as needed., Disp: 30 g, Rfl: 2 .  nystatin (NYSTATIN) powder, Apply topically 3 (three) times daily as needed., Disp: 15 g, Rfl: 1 .  ondansetron (ZOFRAN) 8 MG tablet, Take 1 tablet (8 mg total) by mouth every 8 (eight) hours as needed for nausea or vomiting., Disp: 20 tablet, Rfl: 3 .  oxyCODONE (OXY IR/ROXICODONE) 5 MG immediate release tablet, 1 tablet every 4-6 hours as need for pain, Disp: 30 tablet, Rfl: 0 .  OXYGEN, Inhale 2 L into the lungs continuous. Reported on 09/12/2015, Disp: , Rfl:  .  sildenafil (VIAGRA) 100 MG tablet, Take 1 tablet (100 mg total) by mouth daily as needed for erectile dysfunction., Disp: 10 tablet, Rfl: 12 .  telmisartan (MICARDIS) 40 MG tablet, Take 1 tablet (40 mg total) by mouth daily., Disp: 30 tablet, Rfl: 6  Physical exam:  Vitals:   03/26/16 0912  BP: 128/82  Pulse: 93  Resp: 18  Temp: 97.8 F  (36.6 C)  TempSrc: Tympanic  SpO2: 97%  Weight: 247 lb 9.2 oz (112.3 kg)   Physical Exam  Constitutional: He is oriented to person, place, and time and well-developed, well-nourished, and in no distress.  HENT:  Head: Normocephalic and atraumatic.  Eyes: EOM are normal. Pupils are equal, round, and reactive to light.  Neck: Normal range of motion.  Cardiovascular: Normal rate, regular rhythm and normal heart sounds.   Pulmonary/Chest: Effort normal and breath sounds normal.  Abdominal: Soft. Bowel sounds are normal.  Neurological: He is alert and oriented to person, place, and time.  Skin: Skin is warm and dry.  There is a healing pustule noted over his 4 head in between his eyebrows. This appears to be healing and there is no discharge     CMP Latest Ref Rng & Units 03/25/2016  Glucose 65 - 99 mg/dL 118(H)  BUN 6 - 20  mg/dL 19  Creatinine 0.61 - 1.24 mg/dL 1.16  Sodium 135 - 145 mmol/L 135  Potassium 3.5 - 5.1 mmol/L 4.5  Chloride 101 - 111 mmol/L 101  CO2 22 - 32 mmol/L 28  Calcium 8.9 - 10.3 mg/dL 9.2  Total Protein 6.5 - 8.1 g/dL 7.7  Total Bilirubin 0.3 - 1.2 mg/dL 0.5  Alkaline Phos 38 - 126 U/L 109  AST 15 - 41 U/L 29  ALT 17 - 63 U/L 28   CBC Latest Ref Rng & Units 03/25/2016  WBC 3.8 - 10.6 K/uL 12.4(H)  Hemoglobin 13.0 - 18.0 g/dL 12.8(L)  Hematocrit 40.0 - 52.0 % 37.5(L)  Platelets 150 - 440 K/uL 202    No images are attached to the encounter.  Dg Chest 2 View  Result Date: 03/05/2016 CLINICAL DATA:  Lung cancer and right pleural disease.  Followup. EXAM: CHEST  2 VIEW COMPARISON:  09/09/2015 FINDINGS: Right pleural thickening and parenchymal coarsening with volume loss is unchanged compared to PET-CT 01/15/2016. No drainable fluid is noted. Normal heart size and aortic contours. Porta catheter on the right with tip at the SVC. Clear left chest. IMPRESSION: No detectable change compared to 01/15/2016 PET-CT. No drainable pleural fluid. Electronically Signed    By: Monte Fantasia M.D.   On: 03/05/2016 10:12     Assessment and plan- Patient is a 55 y.o. 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 Counts are okay to proceed with cycle #4 of carboplatin and Alimta and Avastin today. I will make an appointment in 3 weeks' time for the patient to see Dr. Mike Gip with CBC CMP and CEA. Since his white count yesterday was 12.4 I will hold off on Neulasta with this cycle. I did go over his CEA which is trending down and is halved since the last 1 month. Urinalysis showed no significant proteinuria and blood pressure is stable today  2. Pustule over forehead appears to be healing. Continue topical antibiotic.  3. 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 Adenocarcinoma of lung, unspecified laterality ([place])      Dr. Randa Evens, MD, MPH [facility] at [facility] Pager- [phone] 03/26/2016 7:57 AM

## 2016-03-26 NOTE — Progress Notes (Signed)
Patient is here for follow up, he is doing well. He needs a refill on his nystatin powder.   Patient is here to get results of tumor marker, he also has a spot on his forehead that needs to be looked at.

## 2016-04-02 ENCOUNTER — Inpatient Hospital Stay: Payer: PRIVATE HEALTH INSURANCE

## 2016-04-02 DIAGNOSIS — Z5111 Encounter for antineoplastic chemotherapy: Secondary | ICD-10-CM | POA: Diagnosis not present

## 2016-04-02 DIAGNOSIS — C7951 Secondary malignant neoplasm of bone: Secondary | ICD-10-CM

## 2016-04-02 MED ORDER — DENOSUMAB 120 MG/1.7ML ~~LOC~~ SOLN
120.0000 mg | Freq: Once | SUBCUTANEOUS | Status: AC
  Administered 2016-04-02: 120 mg via SUBCUTANEOUS
  Filled 2016-04-02: qty 1.7

## 2016-04-06 ENCOUNTER — Telehealth: Payer: Self-pay | Admitting: *Deleted

## 2016-04-06 NOTE — Telephone Encounter (Signed)
Called pt and corcoran said to try otc delsym and if that does not work then call back. He says that the cough is annoying at night time for him. He sleeps on 2 pillows already.

## 2016-04-08 ENCOUNTER — Other Ambulatory Visit: Payer: Self-pay | Admitting: Hematology and Oncology

## 2016-04-08 ENCOUNTER — Telehealth: Payer: Self-pay | Admitting: *Deleted

## 2016-04-08 DIAGNOSIS — C3490 Malignant neoplasm of unspecified part of unspecified bronchus or lung: Secondary | ICD-10-CM

## 2016-04-08 DIAGNOSIS — J90 Pleural effusion, not elsewhere classified: Secondary | ICD-10-CM

## 2016-04-08 DIAGNOSIS — R05 Cough: Secondary | ICD-10-CM

## 2016-04-08 DIAGNOSIS — R059 Cough, unspecified: Secondary | ICD-10-CM

## 2016-04-08 NOTE — Telephone Encounter (Signed)
Called patient and LVM that MD recommends patient see a dentist about his tooth.  As for the cough, she has ordered a CXR.  Patient instructed to go to medical mall @ [facility] and have CXR.  When results are sent to MD, our office will call him with results and further recommendations regarding cough.

## 2016-04-08 NOTE — Telephone Encounter (Signed)
  Not sure about the tooth. I would have him go to the dentist.  Let's get a CXR.  Order in.  M

## 2016-04-08 NOTE — Telephone Encounter (Signed)
Patient called stating he has a tooth that is bothering him and his cough is no better.  Please advise.

## 2016-04-09 ENCOUNTER — Ambulatory Visit
Admission: RE | Admit: 2016-04-09 | Discharge: 2016-04-09 | Disposition: A | Discharge status: Home / Self Care | Payer: PRIVATE HEALTH INSURANCE | Source: Ambulatory Visit | Attending: Hematology and Oncology | Admitting: Hematology and Oncology

## 2016-04-09 ENCOUNTER — Telehealth: Payer: Self-pay | Admitting: *Deleted

## 2016-04-09 DIAGNOSIS — R05 Cough: Secondary | ICD-10-CM

## 2016-04-09 DIAGNOSIS — C3490 Malignant neoplasm of unspecified part of unspecified bronchus or lung: Secondary | ICD-10-CM

## 2016-04-09 DIAGNOSIS — J90 Pleural effusion, not elsewhere classified: Secondary | ICD-10-CM | POA: Diagnosis not present

## 2016-04-09 DIAGNOSIS — C3491 Malignant neoplasm of unspecified part of right bronchus or lung: Secondary | ICD-10-CM | POA: Diagnosis not present

## 2016-04-09 DIAGNOSIS — R059 Cough, unspecified: Secondary | ICD-10-CM

## 2016-04-09 NOTE — Telephone Encounter (Signed)
Called patient to inform him that CXR is stable.

## 2016-04-09 NOTE — Telephone Encounter (Signed)
-----   Message from Lequita Asal, MD sent at 04/09/2016  8:56 AM EST ----- Regarding: Please call patient  CXR stable  M  ----- Message ----- From: Interface, Rad Results In Sent: 04/09/2016   8:25 AM To: Lequita Asal, MD

## 2016-04-15 ENCOUNTER — Other Ambulatory Visit: Payer: Self-pay | Admitting: Hematology and Oncology

## 2016-04-15 ENCOUNTER — Inpatient Hospital Stay: Payer: No Typology Code available for payment source | Attending: Hematology and Oncology

## 2016-04-15 DIAGNOSIS — Z77098 Contact with and (suspected) exposure to other hazardous, chiefly nonmedicinal, chemicals: Secondary | ICD-10-CM | POA: Insufficient documentation

## 2016-04-15 DIAGNOSIS — Z801 Family history of malignant neoplasm of trachea, bronchus and lung: Secondary | ICD-10-CM | POA: Insufficient documentation

## 2016-04-15 DIAGNOSIS — I10 Essential (primary) hypertension: Secondary | ICD-10-CM | POA: Insufficient documentation

## 2016-04-15 DIAGNOSIS — C3490 Malignant neoplasm of unspecified part of unspecified bronchus or lung: Secondary | ICD-10-CM

## 2016-04-15 DIAGNOSIS — C3491 Malignant neoplasm of unspecified part of right bronchus or lung: Secondary | ICD-10-CM

## 2016-04-15 DIAGNOSIS — R05 Cough: Secondary | ICD-10-CM | POA: Diagnosis not present

## 2016-04-15 DIAGNOSIS — C3411 Malignant neoplasm of upper lobe, right bronchus or lung: Secondary | ICD-10-CM | POA: Insufficient documentation

## 2016-04-15 DIAGNOSIS — J910 Malignant pleural effusion: Secondary | ICD-10-CM | POA: Diagnosis not present

## 2016-04-15 DIAGNOSIS — Z7689 Persons encountering health services in other specified circumstances: Secondary | ICD-10-CM | POA: Diagnosis not present

## 2016-04-15 DIAGNOSIS — Z9221 Personal history of antineoplastic chemotherapy: Secondary | ICD-10-CM | POA: Insufficient documentation

## 2016-04-15 DIAGNOSIS — Z79899 Other long term (current) drug therapy: Secondary | ICD-10-CM | POA: Diagnosis not present

## 2016-04-15 DIAGNOSIS — Z5112 Encounter for antineoplastic immunotherapy: Secondary | ICD-10-CM | POA: Insufficient documentation

## 2016-04-15 DIAGNOSIS — C7951 Secondary malignant neoplasm of bone: Secondary | ICD-10-CM | POA: Insufficient documentation

## 2016-04-15 DIAGNOSIS — Z9981 Dependence on supplemental oxygen: Secondary | ICD-10-CM | POA: Insufficient documentation

## 2016-04-15 DIAGNOSIS — Z5111 Encounter for antineoplastic chemotherapy: Secondary | ICD-10-CM | POA: Insufficient documentation

## 2016-04-15 LAB — COMPREHENSIVE METABOLIC PANEL
ALT: 21 U/L (ref 17–63)
AST: 28 U/L (ref 15–41)
Albumin: 3.9 g/dL (ref 3.5–5.0)
Alkaline Phosphatase: 104 U/L (ref 38–126)
Anion gap: 5 (ref 5–15)
BILIRUBIN TOTAL: 0.7 mg/dL (ref 0.3–1.2)
BUN: 22 mg/dL — ABNORMAL HIGH (ref 6–20)
CHLORIDE: 101 mmol/L (ref 101–111)
CO2: 29 mmol/L (ref 22–32)
CREATININE: 1.38 mg/dL — AB (ref 0.61–1.24)
Calcium: 9.3 mg/dL (ref 8.9–10.3)
GFR, EST NON AFRICAN AMERICAN: 56 mL/min — AB (ref >60)
Glucose, Bld: 135 mg/dL — ABNORMAL HIGH (ref 65–99)
POTASSIUM: 4.8 mmol/L (ref 3.5–5.1)
Sodium: 135 mmol/L (ref 135–145)
Total Protein: 7.6 g/dL (ref 6.5–8.1)

## 2016-04-15 LAB — CBC WITH DIFFERENTIAL/PLATELET
Basophils Absolute: 0 10*3/uL (ref 0–0.1)
Basophils Relative: 0 %
EOS PCT: 2 %
Eosinophils Absolute: 0.1 10*3/uL (ref 0–0.7)
HEMATOCRIT: 34.7 % — AB (ref 40.0–52.0)
Hemoglobin: 11.9 g/dL — ABNORMAL LOW (ref 13.0–18.0)
LYMPHS ABS: 0.7 10*3/uL — AB (ref 1.0–3.6)
LYMPHS PCT: 13 %
MCH: 33.3 pg (ref 26.0–34.0)
MCHC: 34.2 g/dL (ref 32.0–36.0)
MCV: 97.6 fL (ref 80.0–100.0)
MONO ABS: 0.4 10*3/uL (ref 0.2–1.0)
Monocytes Relative: 8 %
Neutro Abs: 4 10*3/uL (ref 1.4–6.5)
Neutrophils Relative %: 77 %
PLATELETS: 230 10*3/uL (ref 150–440)
RBC: 3.56 MIL/uL — ABNORMAL LOW (ref 4.40–5.90)
RDW: 23 % — ABNORMAL HIGH (ref 11.5–14.5)
WBC: 5.3 10*3/uL (ref 3.8–10.6)

## 2016-04-15 LAB — PROTEIN, URINE, RANDOM: TOTAL PROTEIN, URINE: 13 mg/dL

## 2016-04-16 ENCOUNTER — Inpatient Hospital Stay (HOSPITAL_BASED_OUTPATIENT_CLINIC_OR_DEPARTMENT_OTHER): Payer: No Typology Code available for payment source | Admitting: Hematology and Oncology

## 2016-04-16 ENCOUNTER — Other Ambulatory Visit: Payer: Self-pay | Admitting: Hematology and Oncology

## 2016-04-16 ENCOUNTER — Other Ambulatory Visit: Payer: Self-pay | Admitting: *Deleted

## 2016-04-16 ENCOUNTER — Inpatient Hospital Stay: Payer: No Typology Code available for payment source

## 2016-04-16 ENCOUNTER — Encounter: Payer: Self-pay | Admitting: Hematology and Oncology

## 2016-04-16 VITALS — BP 146/86 | HR 101 | Temp 94.8°F | Resp 18 | Wt 248.0 lb

## 2016-04-16 DIAGNOSIS — C7951 Secondary malignant neoplasm of bone: Secondary | ICD-10-CM | POA: Diagnosis not present

## 2016-04-16 DIAGNOSIS — J910 Malignant pleural effusion: Secondary | ICD-10-CM

## 2016-04-16 DIAGNOSIS — Z801 Family history of malignant neoplasm of trachea, bronchus and lung: Secondary | ICD-10-CM

## 2016-04-16 DIAGNOSIS — C3491 Malignant neoplasm of unspecified part of right bronchus or lung: Secondary | ICD-10-CM

## 2016-04-16 DIAGNOSIS — Z9221 Personal history of antineoplastic chemotherapy: Secondary | ICD-10-CM | POA: Diagnosis not present

## 2016-04-16 DIAGNOSIS — Z5112 Encounter for antineoplastic immunotherapy: Secondary | ICD-10-CM

## 2016-04-16 DIAGNOSIS — Z79899 Other long term (current) drug therapy: Secondary | ICD-10-CM

## 2016-04-16 DIAGNOSIS — I10 Essential (primary) hypertension: Secondary | ICD-10-CM

## 2016-04-16 DIAGNOSIS — Z5111 Encounter for antineoplastic chemotherapy: Secondary | ICD-10-CM

## 2016-04-16 DIAGNOSIS — C3490 Malignant neoplasm of unspecified part of unspecified bronchus or lung: Secondary | ICD-10-CM

## 2016-04-16 DIAGNOSIS — Z77098 Contact with and (suspected) exposure to other hazardous, chiefly nonmedicinal, chemicals: Secondary | ICD-10-CM

## 2016-04-16 DIAGNOSIS — C3411 Malignant neoplasm of upper lobe, right bronchus or lung: Secondary | ICD-10-CM | POA: Diagnosis not present

## 2016-04-16 DIAGNOSIS — R05 Cough: Secondary | ICD-10-CM

## 2016-04-16 DIAGNOSIS — Z9981 Dependence on supplemental oxygen: Secondary | ICD-10-CM

## 2016-04-16 LAB — CEA: CEA: 587.6 ng/mL — ABNORMAL HIGH (ref 0.0–4.7)

## 2016-04-16 MED ORDER — OXYCODONE HCL 5 MG PO TABS: ORAL_TABLET | ORAL | 0 refills | Status: DC

## 2016-04-16 MED ORDER — PEGFILGRASTIM 6 MG/0.6ML ~~LOC~~ PSKT
6.0000 mg | PREFILLED_SYRINGE | Freq: Once | SUBCUTANEOUS | Status: AC
  Administered 2016-04-16: 6 mg via SUBCUTANEOUS
  Filled 2016-04-16: qty 0.6

## 2016-04-16 MED ORDER — SODIUM CHLORIDE 0.9 % IV SOLN
Freq: Once | INTRAVENOUS | Status: AC
  Administered 2016-04-16: 11:00:00 via INTRAVENOUS
  Filled 2016-04-16: qty 1000

## 2016-04-16 MED ORDER — DEXAMETHASONE SODIUM PHOSPHATE 10 MG/ML IJ SOLN
10.0000 mg | Freq: Once | INTRAMUSCULAR | Status: AC
  Administered 2016-04-16: 10 mg via INTRAVENOUS
  Filled 2016-04-16: qty 1

## 2016-04-16 MED ORDER — PALONOSETRON HCL INJECTION 0.25 MG/5ML
0.2500 mg | Freq: Once | INTRAVENOUS | Status: AC
  Administered 2016-04-16: 0.25 mg via INTRAVENOUS
  Filled 2016-04-16: qty 5

## 2016-04-16 MED ORDER — SODIUM CHLORIDE 0.9 % IV SOLN
1100.0000 mg | Freq: Once | INTRAVENOUS | Status: AC
  Administered 2016-04-16: 1100 mg via INTRAVENOUS
  Filled 2016-04-16: qty 40

## 2016-04-16 MED ORDER — SODIUM CHLORIDE 0.9 % IV SOLN
1600.0000 mg | Freq: Once | INTRAVENOUS | Status: AC
  Administered 2016-04-16: 1600 mg via INTRAVENOUS
  Filled 2016-04-16: qty 64

## 2016-04-16 MED ORDER — HEPARIN SOD (PORK) LOCK FLUSH 100 UNIT/ML IV SOLN
500.0000 [IU] | Freq: Once | INTRAVENOUS | Status: AC | PRN
  Administered 2016-04-16: 500 [IU]
  Filled 2016-04-16: qty 5

## 2016-04-16 MED ORDER — BENZONATATE 100 MG PO CAPS: 100.0000 mg | ORAL_CAPSULE | Freq: Three times a day (TID) | ORAL | 0 refills | Status: DC | PRN

## 2016-04-16 MED ORDER — SODIUM CHLORIDE 0.9% FLUSH
10.0000 mL | INTRAVENOUS | Status: DC | PRN
  Administered 2016-04-16: 10 mL
  Filled 2016-04-16: qty 10

## 2016-04-16 MED ORDER — SODIUM CHLORIDE 0.9 % IV SOLN
601.0000 mg | Freq: Once | INTRAVENOUS | Status: AC
  Administered 2016-04-16: 600 mg via INTRAVENOUS
  Filled 2016-04-16: qty 60

## 2016-04-16 NOTE — Progress Notes (Signed)
[facility] day:  04/16/2016   Chief Complaint: Stuart Spence is a 56 y.o. male with metastatic lung cancer who is seen for assessment prior to cycle #5 carboplatin and Alimta, and Avastin.  HPI:  The patient was last seen in the medical oncology clinic by me on 03/05/2016.  At that time, he had a mild chronic cough.  CXR was stable.  He received cycle #3 carboplatin, Alimta, and Avastin.  CEA was 870.8.  He saw Dr. Janese Banks in my absence on 03/26/2016.  At that time, he was doing well.  He had a pustule on his forehead.  WBC was 12,400.  He received cycle #4 carboplatin, Alimta, and Avastin.  He did not receive Neulasta.  During the interim, he has done well.  He notes a little cough.  He denies any shortness of breath.   Past Medical History:  Diagnosis Date  . Cancer ([place])    Lung  . Depression   . ED (erectile dysfunction)   . Gout   . Hypertension   . On home oxygen therapy   . Personal history of chemotherapy    PT TAKING CHEMO EVERY 3 WEEKS    Past Surgical History:  Procedure Laterality Date  . CHEST TUBE INSERTION Left 08/13/2015   Procedure: CHEST TUBE INSERTION;  Surgeon: Nestor Lewandowsky, MD;  Location: [facility] ORS;  Service: General;  Laterality: Left;  . CHEST TUBE INSERTION N/A 09/18/2015   Procedure: PLEURX CATH REMOVAL;  Surgeon: Nestor Lewandowsky, MD;  Location: [facility] ORS;  Service: Thoracic;  Laterality: N/A;  . PORTACATH PLACEMENT Left 08/13/2015   Procedure: INSERTION PORT-A-CATH;  Surgeon: Nestor Lewandowsky, MD;  Location: [facility] ORS;  Service: General;  Laterality: Left;  . PORTACATH PLACEMENT    . TONSILLECTOMY      Family History  Problem Relation Age of Onset  . Cancer Mother 95    lung  . Heart attack Father 72  . Hypertension Father   . Congestive Heart Failure Father 90    died from  . Diabetes Sister     Social History:  reports that he has never smoked. He has never used smokeless tobacco. He reports that he does not drink  alcohol or use drugs.  He has worked 71 years in the [employer].He notes exposure to chemicals.   He recently switched jobs.  He is not working.  He had previously planned on helping to build 2 houses.  Patient's wife name is [name].  He is accompanied by his wife and sister, [name], today.  Allergies: No Known Allergies  Current Medications: Current Outpatient Prescriptions  Medication Sig Dispense Refill  . albuterol (PROVENTIL HFA;VENTOLIN HFA) 108 (90 Base) MCG/ACT inhaler Inhale 1-2 puffs into the lungs See admin instructions. Reported on 09/12/2015    . amLODipine (NORVASC) 5 MG tablet Take 1 tablet (5 mg total) by mouth daily. 30 tablet 6  . Calcium Carbonate (CALCIUM 600 PO) Take 1,200 mg by mouth 2 (two) times daily.    . colchicine 0.6 MG tablet Take 0.6 mg by mouth daily as needed (two pills by mouth at onset, the one pill one hour later, maximum three pills per gout flare).    Marland Kitchen dexamethasone (DECADRON) 4 MG tablet TAKE ONE TABLET BY MOUTH TWICE DAILY THE DAY BEFORE CHEMO, AND THE DAY AFTER CHEMO WITH EACH REGIMEN OF CARBO/ALIMTA 36 tablet 0  . Dextromethorphan Polistirex (DELSYM PO) Take by mouth.    Marland Kitchen FLUoxetine (PROZAC) 20  MG capsule Take 2 capsules (40 mg total) by mouth daily. (Patient taking differently: Take 40 mg by mouth every morning. ) 60 capsule 6  . folic acid (FOLVITE) 1 MG tablet Take 1 tablet (1 mg total) by mouth daily. 30 tablet 3  . lidocaine-prilocaine (EMLA) cream Apply 1 application topically as needed. 30 g 2  . nystatin (NYSTATIN) powder Apply topically 3 (three) times daily as needed. 30 g 1  . ondansetron (ZOFRAN) 8 MG tablet Take 1 tablet (8 mg total) by mouth every 8 (eight) hours as needed for nausea or vomiting. 20 tablet 3  . oxyCODONE (OXY IR/ROXICODONE) 5 MG immediate release tablet 1 tablet every 4-6 hours as need for pain 30 tablet 0  . OXYGEN Inhale 2 L into the lungs continuous. Reported on 09/12/2015    . sildenafil (VIAGRA) 100 MG tablet Take  1 tablet (100 mg total) by mouth daily as needed for erectile dysfunction. 10 tablet 12  . telmisartan (MICARDIS) 40 MG tablet Take 1 tablet (40 mg total) by mouth daily. 30 tablet 6   No current facility-administered medications for this visit.     Review of Systems:  GENERAL: Feels good. Sleeping a lot per his wife.  No fevers or sweats.  Weight up 5 pounds. PERFORMANCE STATUS (ECOG): 1 HEENT: No visual changes, runny nose, sore throat, mouth sores or tenderness. Lungs: No shortness of breath. "Little cough". No hemoptysis.   Cardiac: No chest pain, palpitations, orthopnea, or PND. GI: No nausea, vomiting, diarrhea, constipation, melena or hematochezia. No prior colonoscopy. GU: No urgency, frequency, dysuria, or hematuria.  Musculoskeletal: No back pain. No joint pain. No muscle tenderness. Extremities: No pain or swelling. Skin: No rashes or ulcers. Neuro: No headache, numbness or weakness, balance or coordination issues. Endocrine: No diabetes, thyroid issues, hot flashes or night sweats. Psych: No mood changes, depression or anxiety. Pain: No focal pain. Review of systems: All other systems reviewed and found to be negative.  Physical Exam: Blood pressure (!) 146/86, pulse (!) 101, temperature (!) 94.8 F (34.9 C), temperature source Tympanic, resp. rate 18, weight 248 lb 0.3 oz (112.5 kg). GENERAL: Well developed, well nourished, heavyset gentleman sitting in the exam room in no acute distress. MENTAL STATUS: Alert and oriented to person, place and time. HEAD: Short dark hair. Goatee. Normocephalic, atraumatic, face symmetric, no Cushingoid features. EYES: Oval glasses. Pupils equal round and reactive to light and accomodation. No conjunctivitis or scleral icterus. ENT: Oropharynx clear without lesion. Tongue normal. Mucous membranes moist.  RESPIRATORY: Decreased breath sounds right base (stable).  Otherwise, clear to auscultation without rales,  wheezes or rhonchi. CARDIOVASCULAR: Regular rate and rhythm without murmur, rub or gallop. ABDOMEN: Soft, non-tender, with active bowel sounds, and no hepatosplenomegaly. No masses. SKIN: No rashes or skin changes. EXTREMITIES: Mild chronic lower extremity changes.  No skin discoloration or tenderness. No palpable cords. LYMPH NODES: No palpable cervical, supraclavicular, axillary or inguinal adenopathy  NEUROLOGICAL: Unremarkable. PSYCH: Appropriate.   Appointment on 04/15/2016  Component Date Value Ref Range Status  . WBC 04/15/2016 5.3  3.8 - 10.6 K/uL Final  . RBC 04/15/2016 3.56* 4.40 - 5.90 MIL/uL Final  . Hemoglobin 04/15/2016 11.9* 13.0 - 18.0 g/dL Final  . HCT 04/15/2016 34.7* 40.0 - 52.0 % Final  . MCV 04/15/2016 97.6  80.0 - 100.0 fL Final  . MCH 04/15/2016 33.3  26.0 - 34.0 pg Final  . MCHC 04/15/2016 34.2  32.0 - 36.0 g/dL Final  . RDW 04/15/2016 23.0*  11.5 - 14.5 % Final  . Platelets 04/15/2016 230  150 - 440 K/uL Final  . Neutrophils Relative % 04/15/2016 77  % Final  . Neutro Abs 04/15/2016 4.0  1.4 - 6.5 K/uL Final  . Lymphocytes Relative 04/15/2016 13  % Final  . Lymphs Abs 04/15/2016 0.7* 1.0 - 3.6 K/uL Final  . Monocytes Relative 04/15/2016 8  % Final  . Monocytes Absolute 04/15/2016 0.4  0.2 - 1.0 K/uL Final  . Eosinophils Relative 04/15/2016 2  % Final  . Eosinophils Absolute 04/15/2016 0.1  0 - 0.7 K/uL Final  . Basophils Relative 04/15/2016 0  % Final  . Basophils Absolute 04/15/2016 0.0  0 - 0.1 K/uL Final  . Sodium 04/15/2016 135  135 - 145 mmol/L Final  . Potassium 04/15/2016 4.8  3.5 - 5.1 mmol/L Final  . Chloride 04/15/2016 101  101 - 111 mmol/L Final  . CO2 04/15/2016 29  22 - 32 mmol/L Final  . Glucose, Bld 04/15/2016 135* 65 - 99 mg/dL Final  . BUN 04/15/2016 22* 6 - 20 mg/dL Final  . Creatinine, Ser 04/15/2016 1.38* 0.61 - 1.24 mg/dL Final  . Calcium 04/15/2016 9.3  8.9 - 10.3 mg/dL Final  . Total Protein 04/15/2016 7.6  6.5 - 8.1 g/dL  Final  . Albumin 04/15/2016 3.9  3.5 - 5.0 g/dL Final  . AST 04/15/2016 28  15 - 41 U/L Final  . ALT 04/15/2016 21  17 - 63 U/L Final  . Alkaline Phosphatase 04/15/2016 104  38 - 126 U/L Final  . Total Bilirubin 04/15/2016 0.7  0.3 - 1.2 mg/dL Final  . GFR calc non Af Amer 04/15/2016 56* >60 mL/min Final  . GFR calc Af Amer 04/15/2016 >60  >60 mL/min Final   Comment: (NOTE) The eGFR has been calculated using the CKD EPI equation. This calculation has not been validated in all clinical situations. eGFR's persistently <60 mL/min signify possible Chronic Kidney Disease.   . Anion gap 04/15/2016 5  5 - 15 Final  . CEA 04/16/2016 587.6* 0.0 - 4.7 ng/mL Final   Comment: (NOTE)       Roche ECLIA methodology       Nonsmokers  <3.9                                     Smokers     <5.6 Performed At: [facility] [place], [place] [id number] Lindon Romp MD [id number]   . Total Protein, Urine 04/15/2016 13  mg/dL Final    Assessment:  Stuart Spence is a 56 y.o. male with stage IV adenocarcinoma of the right lung.  He has no smoking history.  He presented with with a large right sided pleural effusion and an ill-defined 4.5 x 4.5 cm mass in the right upper lobe. Thoracentesis x 2 of approximately 4.7 liters of blood fluid revealed adenocarcinoma. TTF-1 and Napsin A were immunoreactive and consistent with a lung primary. Pleur-X catheter was placed on 08/13/2015 (removed 09/18/2015).  [facility] testing on 08/18/2015 was negative for:  EGFR, ALK, ROS1 and RET.  Foundation One testing revealed ERBB2 amplification equivocal, RICTOR amplification, APC [id number], CDKN2A/B loss, microsatellite stable, tumor mutation burden intermediate (6 Muts/Mb).  EGFR, K-RAS, ALK, BRAF, MET and RET were negative.  Chest CT angiogram on 08/10/2015 revealed and ill-defined 4.5 x 4.5 cm area of hypodensity in the right upper lobe with small areas  of air bronchogram. This was incompletely  characterized but is concerning for a centrally obstructing mass. There was complete occlusion of the right upper, right middle, and right lower lobe bronchi with complete collapse of the right lung. There was a large right pleural effusion. There was a lytic lesion involving the right fourth rib compatible with metastatic disease. There was no CT evidence of pulmonary embolism.  PET scan on 08/20/2015 revealed a hypermetabolic 5.2 cm medial right upper lobe lung mass, consistent with primary bronchogenic carcinoma, with a broad attachment to the medial right upper lobe pleura.  There was interlobular septal thickening throughout the right lung suggested a component of lymphangitic tumor.  There was a malignant small right pleural effusion with hypermetabolism throughout the right pleural space.  There was hypermetabolic ipsilateral hilar, subcarinal and ipsilateral mediastinal nodal metastases.  There was extensive hypermetabolic lytic osseous metastases throughout the axial and proximal appendicular skeleton.    Regarding the skeleton, there were numerous hypermetabolic faintly lytic osseous metastases in the left humeral head (SUV 6.8), right acromion, bilateral ribs most prominent in the anterior right fourth rib  (SUV 16.0), thoracolumbar spine (T11 vertebral body with SUV 9.4), bilateral iliac bones (medial right iliac bone with SUV 12.1 and left anterior acetabulum with SUV 10.0), and right proximal femur (SUV 10.0 in the region of the right lesser trochanter).   Plain films on 08/21/2015 revealed a left humerus lytic lesion with some thinning and some absence of the cortical margin.  He received 800 cGy to the left humerus on 09/17/2015.  CEA was 837.1 on 09/12/2015, 448 on 10/10/2015, 346 on 10/31/2015, 261.3 on 11/21/2015, 726.2 on 01/02/2016, 857.2 on 01/09/2016, 1458.0 on 01/23/2016, 1350.0 on 01/30/2016, 999.6 on 02/13/2016, 870.8 on 03/04/2016, 671.9 on 03/25/2016, and 587.6 on  04/16/2016.  Anemia work-up on 09/04/2015 revealed a ferritin (519), iron saturation (18%), B12 (3372), and folate (21.9).  He received 4 cycles of Keytruda, carboplatin and Alimta (08/22/2015 - 10/31/2015).   He received 3 cycles of single agent Keytruda (11/21/2015 - 01/02/2016).  Pleur-X catheter was removed on 09/18/2015.    He has received 4 additional cycles of carboplatin and Alimta (01/23/2016 -  03/26/2016).  He has received 2 cycles of Avastin (03/05/2016 - 03/26/2016).  He receives B12 every 9 weeks (last 03/05/2016).  He receives monthly Xgeva (began 08/22/2015; last 04/02/2016).   PET scan on 11/20/2015 revealed a marked positive response to therapy.  There was decrease in size and metabolic activity of RIGHT perihilar mass, resolution of mediastinal nodal metabolic activity, and marked decrease in size and metabolic activity of RIGHT anterior chest wall metastasis.  There was decreased in metabolic activity of multiple skeletal metastasis.    PET scan on 01/16/2016 revealed a marked interval progression of disease compared with previous exam.  There was progression of multifocal hypermetabolic bone metastases.  There was the interval enlargement and right lung perihilar mass with development of pleural spread of tumor and lymphangitic spread of tumor within the right lung.  There was new hypermetabolic and enlarged sub-carinal lymph node.  Symptomatically, he denies any complaint.  He has a little cough.  Exam reveals stable decreased breath sounds in the right lung base.  Plan: 1.  Review labs from yesterday.  Discuss declining CEA. 2.  Discuss plan for reimaging.   3.  Cycle #5 carboplatin and Alimta + Avastin with OnPro Neulasta today. 4.  Schedule PET scan on 05/05/2016. 5.  RTC on 04/30/2016 for labs (BMP) and Xgeva. 6.  RTC in 3 weeks for MD assessment, labs (CBC with diff, CMP, Mg, CEA, random urine protein), review of imaging studies, and cycle #6 carboplatin, Alimta, and  Avastin.   Lequita Asal, MD  04/16/2016, 9:34 AM

## 2016-04-16 NOTE — Progress Notes (Signed)
Patient states he continues to have a cough.  When starting his steroids he noticed it helped with the cough.

## 2016-04-30 ENCOUNTER — Inpatient Hospital Stay: Payer: No Typology Code available for payment source

## 2016-04-30 ENCOUNTER — Telehealth: Payer: Self-pay | Admitting: *Deleted

## 2016-04-30 DIAGNOSIS — C7951 Secondary malignant neoplasm of bone: Secondary | ICD-10-CM

## 2016-04-30 DIAGNOSIS — Z5111 Encounter for antineoplastic chemotherapy: Secondary | ICD-10-CM | POA: Diagnosis not present

## 2016-04-30 DIAGNOSIS — C3490 Malignant neoplasm of unspecified part of unspecified bronchus or lung: Secondary | ICD-10-CM

## 2016-04-30 LAB — BASIC METABOLIC PANEL
Anion gap: 7 (ref 5–15)
BUN: 15 mg/dL (ref 6–20)
CO2: 32 mmol/L (ref 22–32)
Calcium: 9.3 mg/dL (ref 8.9–10.3)
Chloride: 97 mmol/L — ABNORMAL LOW (ref 101–111)
Creatinine, Ser: 1.49 mg/dL — ABNORMAL HIGH (ref 0.61–1.24)
GFR calc Af Amer: 59 mL/min — ABNORMAL LOW (ref >60)
GFR calc non Af Amer: 51 mL/min — ABNORMAL LOW (ref >60)
Glucose, Bld: 107 mg/dL — ABNORMAL HIGH (ref 65–99)
Potassium: 4.4 mmol/L (ref 3.5–5.1)
Sodium: 136 mmol/L (ref 135–145)

## 2016-04-30 MED ORDER — DENOSUMAB 120 MG/1.7ML ~~LOC~~ SOLN
120.0000 mg | Freq: Once | SUBCUTANEOUS | Status: AC
  Administered 2016-04-30: 120 mg via SUBCUTANEOUS
  Filled 2016-04-30: qty 1.7

## 2016-04-30 NOTE — Progress Notes (Signed)
Calcium result of 9.3 and bp today.  Delton See given

## 2016-04-30 NOTE — Telephone Encounter (Signed)
Pt wanted to see me because he has gout in his knee he states.  It was his left knee and it was red and puffy and causing pain. He usually takes colchicine when this happens and it is on his med list. I spoke to [facility] on call for Mike Gip and he said it is ok to call it in for pt. I called and spoke to pharmacy [facility] [place] location. 0.6 mg take 2 tablets at once and then 1 tablet 1 hour later and if that does not clear up then he can take 1 a day for 3 days but make sure the tablet on day 2 is at least 24 hours after the last tablet taken on day 1.  All info told to pt and wrote on paper for pt and he will go and get rx today

## 2016-05-05 ENCOUNTER — Ambulatory Visit
Admission: RE | Admit: 2016-05-05 | Discharge: 2016-05-05 | Disposition: A | Discharge status: Home / Self Care | Payer: No Typology Code available for payment source | Source: Ambulatory Visit | Attending: Hematology and Oncology | Admitting: Hematology and Oncology

## 2016-05-05 DIAGNOSIS — J910 Malignant pleural effusion: Secondary | ICD-10-CM | POA: Diagnosis not present

## 2016-05-05 DIAGNOSIS — C3490 Malignant neoplasm of unspecified part of unspecified bronchus or lung: Secondary | ICD-10-CM | POA: Diagnosis present

## 2016-05-05 DIAGNOSIS — I2510 Atherosclerotic heart disease of native coronary artery without angina pectoris: Secondary | ICD-10-CM | POA: Diagnosis not present

## 2016-05-05 DIAGNOSIS — I700 Atherosclerosis of aorta: Secondary | ICD-10-CM | POA: Insufficient documentation

## 2016-05-05 DIAGNOSIS — C7951 Secondary malignant neoplasm of bone: Secondary | ICD-10-CM | POA: Insufficient documentation

## 2016-05-05 LAB — GLUCOSE, CAPILLARY: Glucose-Capillary: 98 mg/dL (ref 65–99)

## 2016-05-05 MED ORDER — FLUDEOXYGLUCOSE F - 18 (FDG) INJECTION
12.0000 | Freq: Once | INTRAVENOUS | Status: AC | PRN
  Administered 2016-05-05: 12.2 via INTRAVENOUS

## 2016-05-07 ENCOUNTER — Encounter: Payer: Self-pay | Admitting: Hematology and Oncology

## 2016-05-07 ENCOUNTER — Other Ambulatory Visit: Payer: Self-pay | Admitting: Hematology and Oncology

## 2016-05-07 ENCOUNTER — Inpatient Hospital Stay: Payer: No Typology Code available for payment source | Attending: Hematology and Oncology

## 2016-05-07 ENCOUNTER — Inpatient Hospital Stay: Payer: No Typology Code available for payment source

## 2016-05-07 ENCOUNTER — Inpatient Hospital Stay (HOSPITAL_BASED_OUTPATIENT_CLINIC_OR_DEPARTMENT_OTHER): Payer: No Typology Code available for payment source | Admitting: Hematology and Oncology

## 2016-05-07 VITALS — BP 136/95 | HR 90 | Temp 94.8°F | Resp 18 | Wt 242.7 lb

## 2016-05-07 DIAGNOSIS — I10 Essential (primary) hypertension: Secondary | ICD-10-CM | POA: Diagnosis not present

## 2016-05-07 DIAGNOSIS — Z77098 Contact with and (suspected) exposure to other hazardous, chiefly nonmedicinal, chemicals: Secondary | ICD-10-CM | POA: Diagnosis not present

## 2016-05-07 DIAGNOSIS — C7951 Secondary malignant neoplasm of bone: Secondary | ICD-10-CM | POA: Diagnosis not present

## 2016-05-07 DIAGNOSIS — J910 Malignant pleural effusion: Secondary | ICD-10-CM | POA: Insufficient documentation

## 2016-05-07 DIAGNOSIS — M109 Gout, unspecified: Secondary | ICD-10-CM

## 2016-05-07 DIAGNOSIS — B356 Tinea cruris: Secondary | ICD-10-CM | POA: Diagnosis not present

## 2016-05-07 DIAGNOSIS — Z5111 Encounter for antineoplastic chemotherapy: Secondary | ICD-10-CM | POA: Insufficient documentation

## 2016-05-07 DIAGNOSIS — C3411 Malignant neoplasm of upper lobe, right bronchus or lung: Secondary | ICD-10-CM

## 2016-05-07 DIAGNOSIS — Z79899 Other long term (current) drug therapy: Secondary | ICD-10-CM | POA: Insufficient documentation

## 2016-05-07 DIAGNOSIS — C3491 Malignant neoplasm of unspecified part of right bronchus or lung: Secondary | ICD-10-CM

## 2016-05-07 DIAGNOSIS — Z5112 Encounter for antineoplastic immunotherapy: Secondary | ICD-10-CM

## 2016-05-07 DIAGNOSIS — Z9981 Dependence on supplemental oxygen: Secondary | ICD-10-CM | POA: Insufficient documentation

## 2016-05-07 DIAGNOSIS — C3490 Malignant neoplasm of unspecified part of unspecified bronchus or lung: Secondary | ICD-10-CM

## 2016-05-07 DIAGNOSIS — R634 Abnormal weight loss: Secondary | ICD-10-CM

## 2016-05-07 DIAGNOSIS — Z801 Family history of malignant neoplasm of trachea, bronchus and lung: Secondary | ICD-10-CM

## 2016-05-07 DIAGNOSIS — R197 Diarrhea, unspecified: Secondary | ICD-10-CM | POA: Insufficient documentation

## 2016-05-07 DIAGNOSIS — R110 Nausea: Secondary | ICD-10-CM | POA: Insufficient documentation

## 2016-05-07 LAB — PROTEIN, URINE, RANDOM: Total Protein, Urine: 64 mg/dL

## 2016-05-07 LAB — COMPREHENSIVE METABOLIC PANEL
ALT: 24 U/L (ref 17–63)
AST: 29 U/L (ref 15–41)
Albumin: 3.8 g/dL (ref 3.5–5.0)
Alkaline Phosphatase: 99 U/L (ref 38–126)
Anion gap: 7 (ref 5–15)
BUN: 15 mg/dL (ref 6–20)
CO2: 26 mmol/L (ref 22–32)
Calcium: 9.5 mg/dL (ref 8.9–10.3)
Chloride: 103 mmol/L (ref 101–111)
Creatinine, Ser: 1.21 mg/dL (ref 0.61–1.24)
GFR calc Af Amer: >60 mL/min (ref >60)
GFR calc non Af Amer: >60 mL/min (ref >60)
Glucose, Bld: 146 mg/dL — ABNORMAL HIGH (ref 65–99)
Potassium: 4.3 mmol/L (ref 3.5–5.1)
Sodium: 136 mmol/L (ref 135–145)
Total Bilirubin: 0.5 mg/dL (ref 0.3–1.2)
Total Protein: 7.7 g/dL (ref 6.5–8.1)

## 2016-05-07 LAB — CBC WITH DIFFERENTIAL/PLATELET
Basophils Absolute: 0.1 10*3/uL (ref 0–0.1)
Basophils Relative: 1 %
Eosinophils Absolute: 0 10*3/uL (ref 0–0.7)
Eosinophils Relative: 0 %
HCT: 32.4 % — ABNORMAL LOW (ref 40.0–52.0)
Hemoglobin: 10.9 g/dL — ABNORMAL LOW (ref 13.0–18.0)
Lymphocytes Relative: 5 %
Lymphs Abs: 1.1 10*3/uL (ref 1.0–3.6)
MCH: 34.3 pg — ABNORMAL HIGH (ref 26.0–34.0)
MCHC: 33.7 g/dL (ref 32.0–36.0)
MCV: 101.7 fL — ABNORMAL HIGH (ref 80.0–100.0)
Monocytes Absolute: 0.8 10*3/uL (ref 0.2–1.0)
Monocytes Relative: 4 %
Neutro Abs: 17.7 10*3/uL — ABNORMAL HIGH (ref 1.4–6.5)
Neutrophils Relative %: 90 %
Platelets: 300 10*3/uL (ref 150–440)
RBC: 3.19 MIL/uL — ABNORMAL LOW (ref 4.40–5.90)
RDW: 21.8 % — ABNORMAL HIGH (ref 11.5–14.5)
WBC: 19.7 10*3/uL — ABNORMAL HIGH (ref 3.8–10.6)

## 2016-05-07 LAB — MAGNESIUM: Magnesium: 1.9 mg/dL (ref 1.7–2.4)

## 2016-05-07 MED ORDER — DEXAMETHASONE SODIUM PHOSPHATE 10 MG/ML IJ SOLN
10.0000 mg | Freq: Once | INTRAMUSCULAR | Status: AC
  Administered 2016-05-07: 10 mg via INTRAVENOUS
  Filled 2016-05-07: qty 1

## 2016-05-07 MED ORDER — SODIUM CHLORIDE 0.9 % IV SOLN
1100.0000 mg | Freq: Once | INTRAVENOUS | Status: AC
  Administered 2016-05-07: 1100 mg via INTRAVENOUS
  Filled 2016-05-07: qty 40

## 2016-05-07 MED ORDER — SODIUM CHLORIDE 0.9 % IV SOLN
Freq: Once | INTRAVENOUS | Status: AC
  Administered 2016-05-07: 10:00:00 via INTRAVENOUS
  Filled 2016-05-07: qty 1000

## 2016-05-07 MED ORDER — DEXAMETHASONE SODIUM PHOSPHATE 10 MG/ML IJ SOLN: 10.0000 mg | Freq: Once | INTRAMUSCULAR | Status: AC

## 2016-05-07 MED ORDER — HEPARIN SOD (PORK) LOCK FLUSH 100 UNIT/ML IV SOLN
500.0000 [IU] | Freq: Once | INTRAVENOUS | Status: AC
  Administered 2016-05-07: 500 [IU] via INTRAVENOUS
  Filled 2016-05-07: qty 5

## 2016-05-07 MED ORDER — PALONOSETRON HCL INJECTION 0.25 MG/5ML
0.2500 mg | Freq: Once | INTRAVENOUS | Status: AC
  Administered 2016-05-07: 0.25 mg via INTRAVENOUS
  Filled 2016-05-07: qty 5

## 2016-05-07 MED ORDER — SODIUM CHLORIDE 0.9 % IV SOLN
1600.0000 mg | Freq: Once | INTRAVENOUS | Status: AC
  Administered 2016-05-07: 1600 mg via INTRAVENOUS
  Filled 2016-05-07: qty 64

## 2016-05-07 MED ORDER — SODIUM CHLORIDE 0.9 % IV SOLN: 10.0000 mg | Freq: Once | INTRAVENOUS | Status: DC

## 2016-05-07 MED ORDER — SODIUM CHLORIDE 0.9% FLUSH
10.0000 mL | INTRAVENOUS | Status: DC | PRN
  Administered 2016-05-07: 10 mL via INTRAVENOUS
  Filled 2016-05-07: qty 10

## 2016-05-07 MED ORDER — SODIUM CHLORIDE 0.9 % IV SOLN
668.0000 mg | Freq: Once | INTRAVENOUS | Status: AC
  Administered 2016-05-07: 670 mg via INTRAVENOUS
  Filled 2016-05-07: qty 67

## 2016-05-07 MED ORDER — CYANOCOBALAMIN 1000 MCG/ML IJ SOLN
1000.0000 ug | Freq: Once | INTRAMUSCULAR | Status: AC
  Administered 2016-05-07: 1000 ug via INTRAMUSCULAR
  Filled 2016-05-07: qty 1

## 2016-05-07 NOTE — Progress Notes (Signed)
Patient here today for PET results. 

## 2016-05-07 NOTE — Progress Notes (Signed)
[facility] day:  05/07/2016   Chief Complaint: Stuart Spence is a 56 y.o. male with metastatic lung cancer who is seen for assessment prior to cycle #6 carboplatin, Alimta, and Avastin.  HPI:  The patient was last seen in the medical oncology clinic on 04/16/2016.  At that time, he had a little cough.  He denied any shortness of breath or pain.  He received cycle #5 carboplatin, Alimta, and Avastin with Neulasta support.  We discussed restaging studies.  He received Xgeva on 04/30/2016.  PET scan on 05/05/2016 revealed a positive response to therapy with some regression of the right upper lobe mass, and decreasing lymphangitic spread of disease in the right lung, decreasing malignant right pleural effusion, and a mixed response of osseous lesions (more regression than progression).  A lesion in the posterior aspect of T11 measured 3.0 x 3.4 cm (previously 2.2 x 2.4 cm), but was no longer hypermetabolic.  The largest persistent lesion hypermetabolic lesions are in the pelvis in the posterior aspect of the left ilium and in the right side of the sacrum, and are more lytic and sclerotic in appearance, with the largest of these lesions in the right side of the sacrum measuring up to 2.7 cm (SUV 10.5).  Symptomatically, he feels strong.  He denies any shortness of breath.  He denies any pain.   Past Medical History:  Diagnosis Date  . Cancer ([place])    Lung  . Depression   . ED (erectile dysfunction)   . Gout   . Hypertension   . On home oxygen therapy   . Personal history of chemotherapy    PT TAKING CHEMO EVERY 3 WEEKS    Past Surgical History:  Procedure Laterality Date  . CHEST TUBE INSERTION Left 08/13/2015   Procedure: CHEST TUBE INSERTION;  Surgeon: Nestor Lewandowsky, MD;  Location: [facility] ORS;  Service: General;  Laterality: Left;  . CHEST TUBE INSERTION N/A 09/18/2015   Procedure: PLEURX CATH REMOVAL;  Surgeon: Nestor Lewandowsky, MD;  Location: [facility] ORS;   Service: Thoracic;  Laterality: N/A;  . PORTACATH PLACEMENT Left 08/13/2015   Procedure: INSERTION PORT-A-CATH;  Surgeon: Nestor Lewandowsky, MD;  Location: [facility] ORS;  Service: General;  Laterality: Left;  . PORTACATH PLACEMENT    . TONSILLECTOMY      Family History  Problem Relation Age of Onset  . Cancer Mother 52    lung  . Heart attack Father 49  . Hypertension Father   . Congestive Heart Failure Father 59    died from  . Diabetes Sister     Social History:  reports that he has never smoked. He has never used smokeless tobacco. He reports that he does not drink alcohol or use drugs.  He has worked 37 years in the [employer].He notes exposure to chemicals.   He recently switched jobs.  He is not working.  He had previously planned on helping to build 2 houses.  Patient's wife name is [name].  He is accompanied by his wife today.  Allergies: No Known Allergies  Current Medications: Current Outpatient Prescriptions  Medication Sig Dispense Refill  . albuterol (PROVENTIL HFA;VENTOLIN HFA) 108 (90 Base) MCG/ACT inhaler Inhale 1-2 puffs into the lungs See admin instructions. Reported on 09/12/2015    . amLODipine (NORVASC) 5 MG tablet Take 1 tablet (5 mg total) by mouth daily. 30 tablet 6  . benzonatate (TESSALON PERLES) 100 MG capsule Take 1 capsule (100 mg total) by  mouth 3 (three) times daily as needed for cough. 30 capsule 0  . Calcium Carbonate (CALCIUM 600 PO) Take 1,200 mg by mouth 2 (two) times daily.    . colchicine 0.6 MG tablet Take 0.6 mg by mouth daily as needed (two pills by mouth at onset, the one pill one hour later, maximum three pills per gout flare).    Marland Kitchen dexamethasone (DECADRON) 4 MG tablet TAKE ONE TABLET BY MOUTH TWICE DAILY THE DAY BEFORE CHEMO, AND THE DAY AFTER CHEMO WITH EACH REGIMEN OF CARBO/ALIMTA 36 tablet 0  . Dextromethorphan Polistirex (DELSYM PO) Take by mouth.    Marland Kitchen FLUoxetine (PROZAC) 20 MG capsule Take 2 capsules (40 mg total) by mouth daily. (Patient  taking differently: Take 40 mg by mouth every morning. ) 60 capsule 6  . folic acid (FOLVITE) 1 MG tablet Take 1 tablet (1 mg total) by mouth daily. 30 tablet 3  . lidocaine-prilocaine (EMLA) cream Apply 1 application topically as needed. 30 g 2  . nystatin (NYSTATIN) powder Apply topically 3 (three) times daily as needed. 30 g 1  . ondansetron (ZOFRAN) 8 MG tablet Take 1 tablet (8 mg total) by mouth every 8 (eight) hours as needed for nausea or vomiting. 20 tablet 3  . oxyCODONE (OXY IR/ROXICODONE) 5 MG immediate release tablet 1 tablet every 4-6 hours as need for pain 30 tablet 0  . OXYGEN Inhale 2 L into the lungs continuous. Reported on 09/12/2015    . sildenafil (VIAGRA) 100 MG tablet Take 1 tablet (100 mg total) by mouth daily as needed for erectile dysfunction. 10 tablet 12  . telmisartan (MICARDIS) 40 MG tablet Take 1 tablet (40 mg total) by mouth daily. 30 tablet 6   No current facility-administered medications for this visit.    Facility-Administered Medications Ordered in Other Visits  Medication Dose Route Frequency Provider Last Rate Last Dose  . heparin lock flush 100 unit/mL  500 Units Intravenous Once Lequita Asal, MD      . sodium chloride flush (NS) 0.9 % injection 10 mL  10 mL Intravenous PRN Lequita Asal, MD        Review of Systems:  GENERAL: Feels good.No fevers or sweats.  Active. Weight down 6 pounds. PERFORMANCE STATUS (ECOG): 1 HEENT: No visual changes, runny nose, sore throat, mouth sores or tenderness. Lungs: No shortness of breath or cough. No hemoptysis.   Cardiac: No chest pain, palpitations, orthopnea, or PND. GI: No nausea, vomiting, diarrhea, constipation, melena or hematochezia. No prior colonoscopy. GU: No urgency, frequency, dysuria, or hematuria.  Musculoskeletal: No back pain. No joint pain. No muscle tenderness. Extremities: No pain or swelling. Skin: No rashes or ulcers. Neuro: No headache, numbness or weakness, balance  or coordination issues. Endocrine: No diabetes, thyroid issues, hot flashes or night sweats. Psych: No mood changes, depression or anxiety. Pain: No focal pain. Review of systems: All other systems reviewed and found to be negative.  Physical Exam: Blood pressure (!) 136/95, pulse 90, temperature (!) 94.8 F (34.9 C), temperature source Tympanic, resp. rate 18, weight 242 lb 11.6 oz (110.1 kg). GENERAL: Well developed, well nourished, heavyset gentleman sitting in the exam room in no acute distress. MENTAL STATUS: Alert and oriented to person, place and time. HEAD: Short dark hair. Goatee. Normocephalic, atraumatic, face symmetric, no Cushingoid features. EYES: Oval glasses. Pupils equal round and reactive to light and accomodation. No conjunctivitis or scleral icterus. ENT: Oropharynx clear without lesion. Tongue normal. Mucous membranes moist.  RESPIRATORY:  Slight decreased breath sounds right base.  Otherwise, clear to auscultation without rales, wheezes or rhonchi. CARDIOVASCULAR: Regular rate and rhythm without murmur, rub or gallop. ABDOMEN: Soft, non-tender, with active bowel sounds, and no hepatosplenomegaly. No masses. SKIN: No rashes or ulcers. EXTREMITIES: Mild chronic lower extremity changes.  No skin discoloration or tenderness. No palpable cords. LYMPH NODES: No palpable cervical, supraclavicular, axillary or inguinal adenopathy  NEUROLOGICAL: Unremarkable. PSYCH: Appropriate.   Infusion on 05/07/2016  Component Date Value Ref Range Status  . Sodium 05/07/2016 136  135 - 145 mmol/L Final  . Potassium 05/07/2016 4.3  3.5 - 5.1 mmol/L Final  . Chloride 05/07/2016 103  101 - 111 mmol/L Final  . CO2 05/07/2016 26  22 - 32 mmol/L Final  . Glucose, Bld 05/07/2016 146* 65 - 99 mg/dL Final  . BUN 05/07/2016 15  6 - 20 mg/dL Final  . Creatinine, Ser 05/07/2016 1.21  0.61 - 1.24 mg/dL Final  . Calcium 05/07/2016 9.5  8.9 - 10.3 mg/dL Final  . Total Protein  05/07/2016 7.7  6.5 - 8.1 g/dL Final  . Albumin 05/07/2016 3.8  3.5 - 5.0 g/dL Final  . AST 05/07/2016 29  15 - 41 U/L Final  . ALT 05/07/2016 24  17 - 63 U/L Final  . Alkaline Phosphatase 05/07/2016 99  38 - 126 U/L Final  . Total Bilirubin 05/07/2016 0.5  0.3 - 1.2 mg/dL Final  . GFR calc non Af Amer 05/07/2016 >60  >60 mL/min Final  . GFR calc Af Amer 05/07/2016 >60  >60 mL/min Final   Comment: (NOTE) The eGFR has been calculated using the CKD EPI equation. This calculation has not been validated in all clinical situations. eGFR's persistently <60 mL/min signify possible Chronic Kidney Disease.   . Anion gap 05/07/2016 7  5 - 15 Final  . Magnesium 05/07/2016 1.9  1.7 - 2.4 mg/dL Final  . WBC 05/07/2016 19.7* 3.8 - 10.6 K/uL Final  . RBC 05/07/2016 3.19* 4.40 - 5.90 MIL/uL Final  . Hemoglobin 05/07/2016 10.9* 13.0 - 18.0 g/dL Final  . HCT 05/07/2016 32.4* 40.0 - 52.0 % Final  . MCV 05/07/2016 101.7* 80.0 - 100.0 fL Final  . MCH 05/07/2016 34.3* 26.0 - 34.0 pg Final  . MCHC 05/07/2016 33.7  32.0 - 36.0 g/dL Final  . RDW 05/07/2016 21.8* 11.5 - 14.5 % Final  . Platelets 05/07/2016 300  150 - 440 K/uL Final  . Neutrophils Relative % 05/07/2016 90  % Final  . Neutro Abs 05/07/2016 17.7* 1.4 - 6.5 K/uL Final  . Lymphocytes Relative 05/07/2016 5  % Final  . Lymphs Abs 05/07/2016 1.1  1.0 - 3.6 K/uL Final  . Monocytes Relative 05/07/2016 4  % Final  . Monocytes Absolute 05/07/2016 0.8  0.2 - 1.0 K/uL Final  . Eosinophils Relative 05/07/2016 0  % Final  . Eosinophils Absolute 05/07/2016 0.0  0 - 0.7 K/uL Final  . Basophils Relative 05/07/2016 1  % Final  . Basophils Absolute 05/07/2016 0.1  0 - 0.1 K/uL Final  Hospital Outpatient Visit on 05/05/2016  Component Date Value Ref Range Status  . Glucose-Capillary 05/05/2016 98  65 - 99 mg/dL Final    Assessment:  Stuart Spence is a 56 y.o. male with stage IV adenocarcinoma of the right lung.  He has no smoking history.  He presented with  with a large right sided pleural effusion and an ill-defined 4.5 x 4.5 cm mass in the right upper lobe. Thoracentesis x 2  of approximately 4.7 liters of blood fluid revealed adenocarcinoma. TTF-1 and Napsin A were immunoreactive and consistent with a lung primary. Pleur-X catheter was placed on 08/13/2015 (removed 09/18/2015).  [facility] testing on 08/18/2015 was negative for:  EGFR, ALK, ROS1 and RET.  [facility] testing revealed ERBB2 amplification equivocal, RICTOR amplification, APC [id number], CDKN2A/B loss, microsatellite stable, tumor mutation burden intermediate (6 Muts/Mb).  EGFR, K-RAS, ALK, BRAF, MET and RET were negative.  Chest CT angiogram on 08/10/2015 revealed and ill-defined 4.5 x 4.5 cm area of hypodensity in the right upper lobe with small areas of air bronchogram. This was incompletely characterized but is concerning for a centrally obstructing mass. There was complete occlusion of the right upper, right middle, and right lower lobe bronchi with complete collapse of the right lung. There was a large right pleural effusion. There was a lytic lesion involving the right fourth rib compatible with metastatic disease. There was no CT evidence of pulmonary embolism.  PET scan on 08/20/2015 revealed a hypermetabolic 5.2 cm medial right upper lobe lung mass, consistent with primary bronchogenic carcinoma, with a broad attachment to the medial right upper lobe pleura.  There was interlobular septal thickening throughout the right lung suggested a component of lymphangitic tumor.  There was a malignant small right pleural effusion with hypermetabolism throughout the right pleural space.  There was hypermetabolic ipsilateral hilar, subcarinal and ipsilateral mediastinal nodal metastases.  There was extensive hypermetabolic lytic osseous metastases throughout the axial and proximal appendicular skeleton.    Regarding the skeleton, there were numerous hypermetabolic faintly lytic osseous  metastases in the left humeral head (SUV 6.8), right acromion, bilateral ribs most prominent in the anterior right fourth rib  (SUV 16.0), thoracolumbar spine (T11 vertebral body with SUV 9.4), bilateral iliac bones (medial right iliac bone with SUV 12.1 and left anterior acetabulum with SUV 10.0), and right proximal femur (SUV 10.0 in the region of the right lesser trochanter).   Plain films on 08/21/2015 revealed a left humerus lytic lesion with some thinning and some absence of the cortical margin.  He received 800 cGy to the left humerus on 09/17/2015.  CEA was 837.1 on 09/12/2015, 448 on 10/10/2015, 346 on 10/31/2015, 261.3 on 11/21/2015, 726.2 on 01/02/2016, 857.2 on 01/09/2016, 1458.0 on 01/23/2016, 1350.0 on 01/30/2016, 999.6 on 02/13/2016, 870.8 on 03/04/2016, 671.9 on 03/25/2016, 587.6 on 04/16/2016, and 510.6 on 05/07/2016.  Anemia work-up on 09/04/2015 revealed a ferritin (519), iron saturation (18%), B12 (3372), and folate (21.9).  He received 4 cycles of Keytruda, carboplatin and Alimta (08/22/2015 - 10/31/2015).   He received 3 cycles of single agent Keytruda (11/21/2015 - 01/02/2016).  Pleur-X catheter was removed on 09/18/2015.    He has received 5 additional cycles of carboplatin and Alimta (01/23/2016 -  04/16/2016).  He has received 3 cycles of Avastin (03/05/2016 - 04/16/2016).  He receives B12 every 9 weeks (last 03/05/2016).  He receives monthly Xgeva (began 08/22/2015; last 04/30/2016).   PET scan on 11/20/2015 revealed a marked positive response to therapy.  There was decrease in size and metabolic activity of RIGHT perihilar mass, resolution of mediastinal nodal metabolic activity, and marked decrease in size and metabolic activity of RIGHT anterior chest wall metastasis.  There was decreased in metabolic activity of multiple skeletal metastasis.    PET scan on 01/16/2016 revealed a marked interval progression of disease compared with previous exam.  There was progression of  multifocal hypermetabolic bone metastases.  There was the interval enlargement and right lung perihilar mass  with development of pleural spread of tumor and lymphangitic spread of tumor within the right lung.  There was new hypermetabolic and enlarged sub-carinal lymph node.  PET scan on 05/05/2016 revealed a positive response to therapy with some regression of the right upper lobe mass, and decreasing lymphangitic spread of disease in the right lung, decreasing malignant right pleural effusion, and a mixed response of osseous lesions (more regression than progression).  A lesion in the posterior aspect of T11 measured 3.0 x 3.4 cm (previously 2.2 x 2.4 cm), but was no longer hypermetabolic.  The largest persistent lesion hypermetabolic lesions are in the pelvis in the posterior aspect of the left ilium and in the right side of the sacrum, are more lytic and sclerotic in appearance, with the largest of these lesions in the right side of the sacrum measuring up to 2.7 cm (SUV 10.5).  Symptomatically, he denies any complaint.  Exam is stable.  Plan: 1.  Labs today:  CBC with diff, CMP, CEA, Mg, urine protein. 2.  Review interval PET scan.  Discuss overall positive response.  Discuss bone disease.  T11 lesion is no longer hypermetabolic.  Active bone disease in pelvis.  Review with radiology.  Discuss 2 additional cycles of chemotherapy and reimaging.  Discuss potential switch after current cycle to Alimta and Avastin. 3.  Discuss elevated WBC secondary to Neulasta.  No Neulasta with this cycle. 4.  Cycle #6 carboplatin and Alimta + Avastin today. 5.  B12 today. 6.  RTC on 05/28/2016 for labs (BMP) and Xgeva. 7.  RTC in 3 weeks for MD assessment, labs (CBC with diff, CMP, Mg, CEA, random urine protein-day before), and cycle #7 carboplatin, Alimta, and Avastin.   Lequita Asal, MD  05/07/2016, 9:37 AM

## 2016-05-08 ENCOUNTER — Encounter: Payer: Self-pay | Admitting: Hematology and Oncology

## 2016-05-08 DIAGNOSIS — Z5112 Encounter for antineoplastic immunotherapy: Secondary | ICD-10-CM | POA: Insufficient documentation

## 2016-05-08 DIAGNOSIS — Z5111 Encounter for antineoplastic chemotherapy: Secondary | ICD-10-CM | POA: Insufficient documentation

## 2016-05-08 LAB — CEA: CEA: 510.6 ng/mL — ABNORMAL HIGH (ref 0.0–4.7)

## 2016-05-10 ENCOUNTER — Telehealth: Payer: Self-pay | Admitting: *Deleted

## 2016-05-10 NOTE — Telephone Encounter (Signed)
Called patient and LVM that CEA has decreased from 3 weeks ago.  Advised patient to call if questions.

## 2016-05-10 NOTE — Telephone Encounter (Signed)
-----   Message from Lequita Asal, MD sent at 05/08/2016  8:04 AM EST ----- Regarding: Please call patient with CEA   ----- Message ----- From: Interface, Lab In [place] Sent: 05/07/2016   8:59 AM To: Lequita Asal, MD

## 2016-05-13 ENCOUNTER — Emergency Department
Admission: EM | Admit: 2016-05-13 | Discharge: 2016-05-13 | Disposition: A | Discharge status: Home / Self Care | Payer: No Typology Code available for payment source | Attending: Emergency Medicine | Admitting: Emergency Medicine

## 2016-05-13 ENCOUNTER — Encounter: Payer: Self-pay | Admitting: Emergency Medicine

## 2016-05-13 DIAGNOSIS — R112 Nausea with vomiting, unspecified: Secondary | ICD-10-CM | POA: Diagnosis not present

## 2016-05-13 DIAGNOSIS — R55 Syncope and collapse: Secondary | ICD-10-CM | POA: Diagnosis present

## 2016-05-13 DIAGNOSIS — E860 Dehydration: Secondary | ICD-10-CM

## 2016-05-13 DIAGNOSIS — Z85118 Personal history of other malignant neoplasm of bronchus and lung: Secondary | ICD-10-CM | POA: Diagnosis not present

## 2016-05-13 DIAGNOSIS — I10 Essential (primary) hypertension: Secondary | ICD-10-CM | POA: Diagnosis not present

## 2016-05-13 DIAGNOSIS — Z79899 Other long term (current) drug therapy: Secondary | ICD-10-CM | POA: Insufficient documentation

## 2016-05-13 LAB — CBC WITH DIFFERENTIAL/PLATELET
BASOS PCT: 0 %
Basophils Absolute: 0 10*3/uL (ref 0–0.1)
EOS ABS: 0.1 10*3/uL (ref 0–0.7)
EOS PCT: 1 %
HCT: 33.5 % — ABNORMAL LOW (ref 40.0–52.0)
Hemoglobin: 11.3 g/dL — ABNORMAL LOW (ref 13.0–18.0)
Lymphocytes Relative: 14 %
Lymphs Abs: 0.9 10*3/uL — ABNORMAL LOW (ref 1.0–3.6)
MCH: 34.9 pg — ABNORMAL HIGH (ref 26.0–34.0)
MCHC: 33.9 g/dL (ref 32.0–36.0)
MCV: 103 fL — ABNORMAL HIGH (ref 80.0–100.0)
MONO ABS: 0.3 10*3/uL (ref 0.2–1.0)
MONOS PCT: 4 %
Neutro Abs: 5.5 10*3/uL (ref 1.4–6.5)
Neutrophils Relative %: 81 %
Platelets: 146 10*3/uL — ABNORMAL LOW (ref 150–440)
RBC: 3.25 MIL/uL — ABNORMAL LOW (ref 4.40–5.90)
RDW: 19.6 % — AB (ref 11.5–14.5)
WBC: 6.8 10*3/uL (ref 3.8–10.6)

## 2016-05-13 LAB — COMPREHENSIVE METABOLIC PANEL
ALBUMIN: 4.2 g/dL (ref 3.5–5.0)
ALT: 21 U/L (ref 17–63)
ANION GAP: 9 (ref 5–15)
AST: 25 U/L (ref 15–41)
Alkaline Phosphatase: 102 U/L (ref 38–126)
BUN: 40 mg/dL — ABNORMAL HIGH (ref 6–20)
CO2: 27 mmol/L (ref 22–32)
Calcium: 8.6 mg/dL — ABNORMAL LOW (ref 8.9–10.3)
Chloride: 98 mmol/L — ABNORMAL LOW (ref 101–111)
Creatinine, Ser: 1.55 mg/dL — ABNORMAL HIGH (ref 0.61–1.24)
GFR calc Af Amer: 57 mL/min — ABNORMAL LOW (ref >60)
GFR calc non Af Amer: 49 mL/min — ABNORMAL LOW (ref >60)
GLUCOSE: 111 mg/dL — AB (ref 65–99)
POTASSIUM: 5.1 mmol/L (ref 3.5–5.1)
Sodium: 134 mmol/L — ABNORMAL LOW (ref 135–145)
TOTAL PROTEIN: 8.2 g/dL — AB (ref 6.5–8.1)
Total Bilirubin: 1.2 mg/dL (ref 0.3–1.2)

## 2016-05-13 LAB — LACTIC ACID, PLASMA: Lactic Acid, Venous: 1.4 mmol/L (ref 0.5–1.9)

## 2016-05-13 LAB — LIPASE, BLOOD: Lipase: 60 U/L — ABNORMAL HIGH (ref 11–51)

## 2016-05-13 LAB — MAGNESIUM: MAGNESIUM: 2.7 mg/dL — AB (ref 1.7–2.4)

## 2016-05-13 MED ORDER — SODIUM CHLORIDE 0.9 % IV BOLUS (SEPSIS)
1000.0000 mL | INTRAVENOUS | Status: AC
  Administered 2016-05-13: 1000 mL via INTRAVENOUS

## 2016-05-13 NOTE — Discharge Instructions (Signed)
You have been seen today in the Emergency Department (ED)  for syncope (passing out).  Your workup including labs and EKG show reassuring results.  Your symptoms may be due to dehydration, so it is important that you drink plenty of non-alcoholic fluids.  Please call your doctor as soon as possible to schedule the next available clinic appointment to follow up with him/her regarding your visit to the ED and your symptoms.  Return to the Emergency Department (ED)  if you have any further syncopal episodes (pass out again) or develop ANY chest pain, pressure, tightness, trouble breathing, sudden sweating, or other symptoms that concern you.

## 2016-05-13 NOTE — ED Notes (Signed)
All blood work collected by [employer] [facility].

## 2016-05-13 NOTE — ED Provider Notes (Signed)
[facility] Emergency Department Provider Note  ____________________________________________   First MD Initiated Contact with Patient 05/13/16 1831     (approximate)  I have reviewed the triage vital signs and the nursing notes.   HISTORY  Chief Complaint Loss of Consciousness    HPI Stuart Spence is a 56 y.o. male with a history of stage IV lung cancer on chemotherapy every 3 weeks who has last dose was about 1 week ago.  He is cared for by Dr. Mike Gip.  He presents for evaluation of multiple syncopal episodes.  He was helping to clean his church with his daughter and reportedly she found him down a total of 3 times before calling for help.  Each time that he passed out he states that he felt lightheaded and dizzy beforehand with some nausea and then lost consciousness.  He did not sustain any injuries, did not strike his head, and reports no headache or neck pain.  He denies chest pain, shortness of breath, abdominal pain.  He states the symptoms were acute in onset, severe, and nothing made it better or worse.  He reports that he currently feels a little bit lightheaded but better as long as he is lying down.  Of note he was found to be orthostatic with a drop in blood pressure and a rise in heart rate upon arrival to the emergency department.  He states that he typically feels the worse after a chemotherapy treatment in about 5 days of the treatment.  He feels better the today than he sometimes does, but does state that he has been nauseated and vomiting over the last few days and has not been eating or drinking well.  He denies fever/chills and dysuria and has not had any recent cough or upper respiratory symptoms.   Past Medical History:  Diagnosis Date  . Cancer ([place])    Lung  . Depression   . ED (erectile dysfunction)   . Gout   . Hypertension   . On home oxygen therapy   . Personal history of chemotherapy    PT TAKING CHEMO EVERY 3 WEEKS     Patient Active Problem List   Diagnosis Date Noted  . Encounter for antineoplastic chemotherapy 05/08/2016  . Encounter for antineoplastic immunotherapy 05/08/2016  . Hypocalcemia 09/12/2015  . Hyperglycemia, unspecified 09/12/2015  . Bone metastasis ([place]) 08/20/2015  . Adenocarcinoma, lung ([place]) 08/19/2015  . Pleural effusion, right   . Respiratory failure with hypoxia ([place]) 08/10/2015  . Essential hypertension 06/23/2015  . Depression 06/23/2015    Past Surgical History:  Procedure Laterality Date  . CHEST TUBE INSERTION Left 08/13/2015   Procedure: CHEST TUBE INSERTION;  Surgeon: Nestor Lewandowsky, MD;  Location: [facility] ORS;  Service: General;  Laterality: Left;  . CHEST TUBE INSERTION N/A 09/18/2015   Procedure: PLEURX CATH REMOVAL;  Surgeon: Nestor Lewandowsky, MD;  Location: [facility] ORS;  Service: Thoracic;  Laterality: N/A;  . PORTACATH PLACEMENT Left 08/13/2015   Procedure: INSERTION PORT-A-CATH;  Surgeon: Nestor Lewandowsky, MD;  Location: [facility] ORS;  Service: General;  Laterality: Left;  . PORTACATH PLACEMENT    . TONSILLECTOMY      Prior to Admission medications   Medication Sig Start Date End Date Taking? Authorizing Provider  albuterol (PROVENTIL HFA;VENTOLIN HFA) 108 (90 Base) MCG/ACT inhaler Inhale 1-2 puffs into the lungs See admin instructions. Reported on 09/12/2015    Historical Provider, MD  amLODipine (NORVASC) 5 MG tablet Take 1 tablet (5 mg total) by mouth daily.  12/24/15   Guadalupe Maple, MD  benzonatate (TESSALON PERLES) 100 MG capsule Take 1 capsule (100 mg total) by mouth 3 (three) times daily as needed for cough. 04/16/16   Lequita Asal, MD  Calcium Carbonate (CALCIUM 600 PO) Take 1,200 mg by mouth 2 (two) times daily.    Historical Provider, MD  colchicine 0.6 MG tablet Take 0.6 mg by mouth daily as needed (two pills by mouth at onset, the one pill one hour later, maximum three pills per gout flare).    Historical Provider, MD  dexamethasone (DECADRON) 4 MG tablet TAKE  ONE TABLET BY MOUTH TWICE DAILY THE DAY BEFORE CHEMO, AND THE DAY AFTER CHEMO WITH EACH REGIMEN OF CARBO/ALIMTA 04/15/16   Lequita Asal, MD  Dextromethorphan Polistirex (DELSYM PO) Take by mouth.    Historical Provider, MD  FLUoxetine (PROZAC) 20 MG capsule Take 2 capsules (40 mg total) by mouth daily. Patient taking differently: Take 40 mg by mouth every morning.  09/04/15   Guadalupe Maple, MD  folic acid (FOLVITE) 1 MG tablet Take 1 tablet (1 mg total) by mouth daily. 02/12/16   Lequita Asal, MD  lidocaine-prilocaine (EMLA) cream Apply 1 application topically as needed. 08/21/15   Lequita Asal, MD  nystatin (NYSTATIN) powder Apply topically 3 (three) times daily as needed. 03/26/16   Sindy Guadeloupe, MD  ondansetron (ZOFRAN) 8 MG tablet Take 1 tablet (8 mg total) by mouth every 8 (eight) hours as needed for nausea or vomiting. 08/21/15   Lequita Asal, MD  oxyCODONE (OXY IR/ROXICODONE) 5 MG immediate release tablet 1 tablet every 4-6 hours as need for pain 04/16/16   Lequita Asal, MD  OXYGEN Inhale 2 L into the lungs continuous. Reported on 09/12/2015    Historical Provider, MD  sildenafil (VIAGRA) 100 MG tablet Take 1 tablet (100 mg total) by mouth daily as needed for erectile dysfunction. 12/24/15   Guadalupe Maple, MD  telmisartan (MICARDIS) 40 MG tablet Take 1 tablet (40 mg total) by mouth daily. 12/24/15   Guadalupe Maple, MD    Allergies Patient has no known allergies.  Family History  Problem Relation Age of Onset  . Cancer Mother 53    lung  . Heart attack Father 24  . Hypertension Father   . Congestive Heart Failure Father 15    died from  . Diabetes Sister     Social History Social History  Substance Use Topics  . Smoking status: Never Smoker  . Smokeless tobacco: Never Used  . Alcohol use No     Comment: gave it up in August 2016    Review of Systems Constitutional: No fever/chills Eyes: No visual changes. ENT: No sore throat. Cardiovascular:  Denies chest pain.  Syncope and collapse x 3 Respiratory: Denies shortness of breath. Gastrointestinal: No abdominal pain.  Recent nausea and vomiting and anorexia after chemotherapy last week. Genitourinary: Negative for dysuria. Musculoskeletal: Negative for back pain. Skin: Negative for rash. Neurological: Negative for headaches, focal weakness or numbness.  Does feel lightheaded and dizzy  10-point ROS otherwise negative.  ____________________________________________   PHYSICAL EXAM:  VITAL SIGNS: ED Triage Vitals  Enc Vitals Group     BP 05/13/16 1738 112/67     Pulse Rate 05/13/16 1738 94     Resp 05/13/16 1738 18     Temp 05/13/16 1738 97.7 F (36.5 C)     Temp Source 05/13/16 1738 Oral     SpO2 05/13/16  1735 96 %     Weight 05/13/16 1745 247 lb 8 oz (112.3 kg)     Height 05/13/16 1745 '5\' 7"'$  (1.702 m)     Head Circumference --      Peak Flow --      Pain Score --      Pain Loc --      Pain Edu? --      Excl. in [place]? --     Constitutional: Alert and oriented. Well appearing and in no acute distress. Eyes: Conjunctivae are normal. PERRL. EOMI. Head: Atraumatic. Nose: No congestion/rhinnorhea. Mouth/Throat: Mucous membranes are moist. Neck: No stridor.  No meningeal signs.  No cervical spine tenderness to palpation. Cardiovascular: Borderline tachycardia, regular rhythm. Good peripheral circulation. Grossly normal heart sounds. Respiratory: Normal respiratory effort.  No retractions. Lungs CTAB. Gastrointestinal: Soft and nontender. No distention.  Musculoskeletal: No lower extremity tenderness nor edema. No gross deformities of extremities. Neurologic:  Normal speech and language. No gross focal neurologic deficits are appreciated.  Skin:  Skin is warm, dry and intact. No rash noted. Psychiatric: Mood and affect are normal. Speech and behavior are normal.  ____________________________________________   LABS (all labs ordered are listed, but only abnormal  results are displayed)  Labs Reviewed  CBC WITH DIFFERENTIAL/PLATELET - Abnormal; Notable for the following:       Result Value   RBC 3.25 (*)    Hemoglobin 11.3 (*)    HCT 33.5 (*)    MCV 103.0 (*)    MCH 34.9 (*)    RDW 19.6 (*)    Platelets 146 (*)    Lymphs Abs 0.9 (*)    All other components within normal limits  COMPREHENSIVE METABOLIC PANEL - Abnormal; Notable for the following:    Sodium 134 (*)    Chloride 98 (*)    Glucose, Bld 111 (*)    BUN 40 (*)    Creatinine, Ser 1.55 (*)    Calcium 8.6 (*)    Total Protein 8.2 (*)    GFR calc non Af Amer 49 (*)    GFR calc Af Amer 57 (*)    All other components within normal limits  LIPASE, BLOOD - Abnormal; Notable for the following:    Lipase 60 (*)    All other components within normal limits  MAGNESIUM - Abnormal; Notable for the following:    Magnesium 2.7 (*)    All other components within normal limits  CULTURE, BLOOD (ROUTINE X 2)  CULTURE, BLOOD (ROUTINE X 2)  LACTIC ACID, PLASMA  CBC WITH DIFFERENTIAL/PLATELET  URINALYSIS, ROUTINE W REFLEX MICROSCOPIC  CBG MONITORING, ED   ____________________________________________  EKG  ED ECG REPORT I, Kohana Amble, the attending physician, personally viewed and interpreted this ECG.  Date: 05/13/2016 EKG Time: 17:35 Rate: 89 Rhythm: normal sinus rhythm QRS Axis: normal Intervals: normal ST/T Wave abnormalities: Non-specific ST segment / T-wave changes, but no evidence of acute ischemia. Conduction Disturbances: none Narrative Interpretation: unremarkable  ____________________________________________  RADIOLOGY   No results found.  ____________________________________________   PROCEDURES  Procedure(s) performed:   Procedures   Critical Care performed: No ____________________________________________   INITIAL IMPRESSION / ASSESSMENT AND PLAN / ED COURSE  Pertinent labs & imaging results that were available during my care of the patient were  reviewed by me and considered in my medical decision making (see chart for details).  The patient does not meet sepsis criteria and I think the most likely he is having syncopal episodes because he  is volume depleted in the setting of his recent chemotherapy and the resulting in nausea, vomiting, and decreased oral intake.  He was a very difficult IV access patient so the nurse currently accessing his port and then we will give him a liter of fluids as well as checking blood work including standard labs and a lactic acid.  I will reassess after the workup and IV fluids.   Clinical Course as of May 13 2353  Thu May 13, 2016  2219 The patient is feeling much better after a couple of liters of fluid.  I looked back through his labs and his creatinine varies from time to time but it is essentially at its baseline.  He was able to tolerate least a few bites of [employer] that his family brought him and he states that he no longer feels weak or dizzy.  I discussed the plan for outpatient follow-up and he and his wife agree.  If he can ambulate without any orthostasis then I think he will be stable for going home and following up with his oncologist.  [CF]  2331 Ambulated around the department without difficulty.  No dizziness/lightheadedness.  Will d/c for outpatient follow up and will send note to Dr. Mike Gip via [facility].  [CF]    Clinical Course User Index [CF] Hinda Kehr, MD    ____________________________________________  FINAL CLINICAL IMPRESSION(S) / ED DIAGNOSES  Final diagnoses:  Syncope and collapse  Dehydration  Nausea and vomiting, intractability of vomiting not specified, unspecified vomiting type     MEDICATIONS GIVEN DURING THIS VISIT:  Medications  sodium chloride 0.9 % bolus 1,000 mL (0 mLs Intravenous Stopped 05/13/16 2057)  sodium chloride 0.9 % bolus 1,000 mL (0 mLs Intravenous Stopped 05/13/16 2227)     NEW OUTPATIENT MEDICATIONS STARTED DURING THIS VISIT:  Discharge  Medication List as of 05/13/2016 11:36 PM      Discharge Medication List as of 05/13/2016 11:36 PM      Discharge Medication List as of 05/13/2016 11:36 PM       Note:  This document was prepared using Dragon voice recognition software and may include unintentional dictation errors.    Hinda Kehr, MD 05/13/16 2355

## 2016-05-13 NOTE — ED Notes (Signed)
Unsuccessful port access x2, asking Lab to come draw blood from this patient.

## 2016-05-13 NOTE — ED Notes (Signed)
Pt ambulated down the hall between [facility] A[facility]B and pt reported no dizziness or weakness.  Pt walked at steady pace with no complications in gait.

## 2016-05-13 NOTE — ED Notes (Signed)
Lab at bedside

## 2016-05-13 NOTE — ED Notes (Signed)
CMP, Lipase, and Magnesium ordered to replace orders for the hemolyzed sample they received earlier.

## 2016-05-13 NOTE — ED Notes (Signed)
Unsuccessful IV start LAC x1

## 2016-05-13 NOTE — ED Triage Notes (Signed)
Pt in by EMS from home with syncopal episodes x3 today.  Patient denies any pain or injury from falls.  Pt is lung ca pt with last chemo last week.  Pt AOx4 at triage and recalls all of his falls.

## 2016-05-14 ENCOUNTER — Telehealth: Payer: Self-pay | Admitting: *Deleted

## 2016-05-14 NOTE — Telephone Encounter (Signed)
Wanted to let MD know that he had to go to ER last night for dehydration. They gave him IVF. He requests a call back this morning. Returned call to patient, he reports he is feeling better this morning drinking OJ has not attempted to eat yet. Advised to call back if he gets worse

## 2016-05-18 LAB — CULTURE, BLOOD (ROUTINE X 2)
CULTURE: NO GROWTH
Culture: NO GROWTH

## 2016-05-27 ENCOUNTER — Inpatient Hospital Stay: Payer: No Typology Code available for payment source

## 2016-05-27 DIAGNOSIS — C3491 Malignant neoplasm of unspecified part of right bronchus or lung: Secondary | ICD-10-CM

## 2016-05-27 DIAGNOSIS — Z5111 Encounter for antineoplastic chemotherapy: Secondary | ICD-10-CM | POA: Diagnosis not present

## 2016-05-27 LAB — COMPREHENSIVE METABOLIC PANEL
ALT: 26 U/L (ref 17–63)
AST: 34 U/L (ref 15–41)
Albumin: 4 g/dL (ref 3.5–5.0)
Alkaline Phosphatase: 108 U/L (ref 38–126)
Anion gap: 7 (ref 5–15)
BUN: 15 mg/dL (ref 6–20)
CO2: 30 mmol/L (ref 22–32)
Calcium: 9.3 mg/dL (ref 8.9–10.3)
Chloride: 102 mmol/L (ref 101–111)
Creatinine, Ser: 1.47 mg/dL — ABNORMAL HIGH (ref 0.61–1.24)
GFR calc Af Amer: >60 mL/min (ref >60)
GFR calc non Af Amer: 52 mL/min — ABNORMAL LOW (ref >60)
Glucose, Bld: 119 mg/dL — ABNORMAL HIGH (ref 65–99)
Potassium: 4.6 mmol/L (ref 3.5–5.1)
Sodium: 139 mmol/L (ref 135–145)
Total Bilirubin: 0.7 mg/dL (ref 0.3–1.2)
Total Protein: 7.9 g/dL (ref 6.5–8.1)

## 2016-05-27 LAB — CBC WITH DIFFERENTIAL/PLATELET
Basophils Absolute: 0 10*3/uL (ref 0–0.1)
Basophils Relative: 1 %
Eosinophils Absolute: 0 10*3/uL (ref 0–0.7)
Eosinophils Relative: 2 %
HCT: 32.3 % — ABNORMAL LOW (ref 40.0–52.0)
Hemoglobin: 10.9 g/dL — ABNORMAL LOW (ref 13.0–18.0)
Lymphocytes Relative: 21 %
Lymphs Abs: 0.6 10*3/uL — ABNORMAL LOW (ref 1.0–3.6)
MCH: 36 pg — ABNORMAL HIGH (ref 26.0–34.0)
MCHC: 33.7 g/dL (ref 32.0–36.0)
MCV: 106.8 fL — ABNORMAL HIGH (ref 80.0–100.0)
Monocytes Absolute: 0.4 10*3/uL (ref 0.2–1.0)
Monocytes Relative: 14 %
Neutro Abs: 1.6 10*3/uL (ref 1.4–6.5)
Neutrophils Relative %: 62 %
Platelets: 183 10*3/uL (ref 150–440)
RBC: 3.02 MIL/uL — ABNORMAL LOW (ref 4.40–5.90)
RDW: 19.5 % — ABNORMAL HIGH (ref 11.5–14.5)
WBC: 2.6 10*3/uL — ABNORMAL LOW (ref 3.8–10.6)

## 2016-05-27 LAB — PROTEIN, URINE, RANDOM: Total Protein, Urine: 14 mg/dL

## 2016-05-27 LAB — MAGNESIUM: Magnesium: 1.8 mg/dL (ref 1.7–2.4)

## 2016-05-28 ENCOUNTER — Inpatient Hospital Stay: Payer: No Typology Code available for payment source

## 2016-05-28 ENCOUNTER — Other Ambulatory Visit: Payer: Self-pay | Admitting: Hematology and Oncology

## 2016-05-28 ENCOUNTER — Inpatient Hospital Stay (HOSPITAL_BASED_OUTPATIENT_CLINIC_OR_DEPARTMENT_OTHER): Payer: No Typology Code available for payment source | Admitting: Hematology and Oncology

## 2016-05-28 ENCOUNTER — Encounter: Payer: Self-pay | Admitting: Hematology and Oncology

## 2016-05-28 VITALS — BP 146/97 | HR 92 | Temp 96.6°F | Resp 18 | Wt 240.1 lb

## 2016-05-28 VITALS — BP 142/85 | HR 84

## 2016-05-28 DIAGNOSIS — C7951 Secondary malignant neoplasm of bone: Secondary | ICD-10-CM

## 2016-05-28 DIAGNOSIS — C3491 Malignant neoplasm of unspecified part of right bronchus or lung: Secondary | ICD-10-CM

## 2016-05-28 DIAGNOSIS — I10 Essential (primary) hypertension: Secondary | ICD-10-CM

## 2016-05-28 DIAGNOSIS — R110 Nausea: Secondary | ICD-10-CM | POA: Diagnosis not present

## 2016-05-28 DIAGNOSIS — J910 Malignant pleural effusion: Secondary | ICD-10-CM

## 2016-05-28 DIAGNOSIS — R197 Diarrhea, unspecified: Secondary | ICD-10-CM

## 2016-05-28 DIAGNOSIS — M109 Gout, unspecified: Secondary | ICD-10-CM

## 2016-05-28 DIAGNOSIS — Z5112 Encounter for antineoplastic immunotherapy: Secondary | ICD-10-CM

## 2016-05-28 DIAGNOSIS — Z79899 Other long term (current) drug therapy: Secondary | ICD-10-CM

## 2016-05-28 DIAGNOSIS — Z77098 Contact with and (suspected) exposure to other hazardous, chiefly nonmedicinal, chemicals: Secondary | ICD-10-CM | POA: Diagnosis not present

## 2016-05-28 DIAGNOSIS — Z9981 Dependence on supplemental oxygen: Secondary | ICD-10-CM | POA: Diagnosis not present

## 2016-05-28 DIAGNOSIS — B356 Tinea cruris: Secondary | ICD-10-CM | POA: Diagnosis not present

## 2016-05-28 DIAGNOSIS — Z801 Family history of malignant neoplasm of trachea, bronchus and lung: Secondary | ICD-10-CM

## 2016-05-28 DIAGNOSIS — Z5111 Encounter for antineoplastic chemotherapy: Secondary | ICD-10-CM

## 2016-05-28 DIAGNOSIS — C3411 Malignant neoplasm of upper lobe, right bronchus or lung: Secondary | ICD-10-CM | POA: Diagnosis not present

## 2016-05-28 DIAGNOSIS — B379 Candidiasis, unspecified: Secondary | ICD-10-CM

## 2016-05-28 LAB — CBC WITH DIFFERENTIAL/PLATELET
Basophils Absolute: 0 10*3/uL (ref 0–0.1)
Basophils Relative: 0 %
Eosinophils Absolute: 0 10*3/uL (ref 0–0.7)
Eosinophils Relative: 0 %
HCT: 32 % — ABNORMAL LOW (ref 40.0–52.0)
Hemoglobin: 10.9 g/dL — ABNORMAL LOW (ref 13.0–18.0)
Lymphocytes Relative: 11 %
Lymphs Abs: 0.5 10*3/uL — ABNORMAL LOW (ref 1.0–3.6)
MCH: 36.4 pg — ABNORMAL HIGH (ref 26.0–34.0)
MCHC: 34.1 g/dL (ref 32.0–36.0)
MCV: 106.7 fL — ABNORMAL HIGH (ref 80.0–100.0)
Monocytes Absolute: 0.3 10*3/uL (ref 0.2–1.0)
Monocytes Relative: 7 %
Neutro Abs: 4.2 10*3/uL (ref 1.4–6.5)
Neutrophils Relative %: 82 %
Platelets: 217 10*3/uL (ref 150–440)
RBC: 2.99 MIL/uL — ABNORMAL LOW (ref 4.40–5.90)
RDW: 19 % — ABNORMAL HIGH (ref 11.5–14.5)
WBC: 5.1 10*3/uL (ref 3.8–10.6)

## 2016-05-28 LAB — CEA: CEA: 512.5 ng/mL — ABNORMAL HIGH (ref 0.0–4.7)

## 2016-05-28 MED ORDER — DEXAMETHASONE SODIUM PHOSPHATE 10 MG/ML IJ SOLN
10.0000 mg | Freq: Once | INTRAMUSCULAR | Status: AC
  Administered 2016-05-28: 10 mg via INTRAVENOUS
  Filled 2016-05-28: qty 1

## 2016-05-28 MED ORDER — NYSTATIN 100000 UNIT/GM EX POWD: Freq: Three times a day (TID) | CUTANEOUS | 1 refills | Status: DC | PRN

## 2016-05-28 MED ORDER — ONDANSETRON 8 MG PO TBDP
8.0000 mg | ORAL_TABLET | Freq: Once | ORAL | Status: AC
  Administered 2016-05-28: 8 mg via ORAL
  Filled 2016-05-28: qty 1

## 2016-05-28 MED ORDER — PEGFILGRASTIM 6 MG/0.6ML ~~LOC~~ PSKT
6.0000 mg | PREFILLED_SYRINGE | Freq: Once | SUBCUTANEOUS | Status: AC
  Administered 2016-05-28: 6 mg via SUBCUTANEOUS
  Filled 2016-05-28: qty 0.6

## 2016-05-28 MED ORDER — PEMETREXED DISODIUM CHEMO INJECTION 500 MG
1100.0000 mg | Freq: Once | INTRAVENOUS | Status: AC
  Administered 2016-05-28: 1100 mg via INTRAVENOUS
  Filled 2016-05-28: qty 40

## 2016-05-28 MED ORDER — SODIUM CHLORIDE 0.9 % IV SOLN
Freq: Once | INTRAVENOUS | Status: AC
  Administered 2016-05-28: 11:00:00 via INTRAVENOUS
  Filled 2016-05-28: qty 1000

## 2016-05-28 MED ORDER — BEVACIZUMAB CHEMO INJECTION 400 MG/16ML
1600.0000 mg | Freq: Once | INTRAVENOUS | Status: AC
  Administered 2016-05-28: 1600 mg via INTRAVENOUS
  Filled 2016-05-28: qty 64

## 2016-05-28 MED ORDER — HEPARIN SOD (PORK) LOCK FLUSH 100 UNIT/ML IV SOLN
500.0000 [IU] | Freq: Once | INTRAVENOUS | Status: AC | PRN
  Administered 2016-05-28: 500 [IU]
  Filled 2016-05-28: qty 5

## 2016-05-28 MED ORDER — SODIUM CHLORIDE 0.9 % IV SOLN: Freq: Once | INTRAVENOUS | Status: DC

## 2016-05-28 MED ORDER — SODIUM CHLORIDE 0.9% FLUSH
10.0000 mL | INTRAVENOUS | Status: DC | PRN
  Administered 2016-05-28: 10 mL
  Filled 2016-05-28: qty 10

## 2016-05-28 MED ORDER — SODIUM CHLORIDE 0.9 % IV SOLN: 10.0000 mg | Freq: Once | INTRAVENOUS | Status: DC

## 2016-05-28 MED ORDER — DENOSUMAB 120 MG/1.7ML ~~LOC~~ SOLN
120.0000 mg | Freq: Once | SUBCUTANEOUS | Status: AC
  Administered 2016-05-28: 120 mg via SUBCUTANEOUS
  Filled 2016-05-28: qty 1.7

## 2016-05-28 MED ORDER — OXYCODONE HCL 5 MG PO TABS: ORAL_TABLET | ORAL | 0 refills | Status: DC

## 2016-05-28 NOTE — Progress Notes (Signed)
Patient is here today for follow up, he had a hard time after treatment last time. He is sleeping a lot, decreased appetite, diarrhea and dry heaves.   Patient would like to get nystatin cream sent to the pharmacy for discoloration in groin area this is worse than last time.  Also needs oxycodone sent to pharmacy ([facility] [place])

## 2016-05-28 NOTE — Progress Notes (Signed)
[facility] day:  05/28/2016   Chief Complaint: Stuart Spence is a 56 y.o. male with metastatic lung cancer who is seen for assessment prior to cycle #1 maintenance Alimta and Avastin.  HPI:  The patient was last seen in the medical oncology clinic on 05/07/2016.  At that time, he felt good.  He denied any pain.  PET scan revealed a positive response to therapy.  WBC was 19,700.  He received cycle #6 carboplatin, Alimta, and Avastin.  Decision was made not to receive Neulasta.  He received B12.  Symptomatically, he notes diarrhea right after his treatment.  He begins to have nausea on the Tuesday after chemotherapy.  Symptoms continue through about Thursday. He starts to improve over the weekend.  He was seen in the [facility] ER on 05/13/2016 with dehydration.  He received IVF.  He feels good today.  He denies any shortness of breath.  He denies any pain.  He notes recurrence of his fungal groin rash.   Past Medical History:  Diagnosis Date  . Cancer ([place])    Lung  . Depression   . ED (erectile dysfunction)   . Gout   . Hypertension   . On home oxygen therapy   . Personal history of chemotherapy    PT TAKING CHEMO EVERY 3 WEEKS    Past Surgical History:  Procedure Laterality Date  . CHEST TUBE INSERTION Left 08/13/2015   Procedure: CHEST TUBE INSERTION;  Surgeon: Nestor Lewandowsky, MD;  Location: [facility] ORS;  Service: General;  Laterality: Left;  . CHEST TUBE INSERTION N/A 09/18/2015   Procedure: PLEURX CATH REMOVAL;  Surgeon: Nestor Lewandowsky, MD;  Location: [facility] ORS;  Service: Thoracic;  Laterality: N/A;  . PORTACATH PLACEMENT Left 08/13/2015   Procedure: INSERTION PORT-A-CATH;  Surgeon: Nestor Lewandowsky, MD;  Location: [facility] ORS;  Service: General;  Laterality: Left;  . PORTACATH PLACEMENT    . TONSILLECTOMY      Family History  Problem Relation Age of Onset  . Cancer Mother 31    lung  . Heart attack Father 66  . Hypertension Father   . Congestive  Heart Failure Father 66    died from  . Diabetes Sister     Social History:  reports that he has never smoked. He has never used smokeless tobacco. He reports that he does not drink alcohol or use drugs.  He has worked 60 years in the [employer].He notes exposure to chemicals.   He recently switched jobs.  He is not working.  He had previously planned on helping to build 2 houses.  Patient's wife name is [name].  He is accompanied by his wife and daughter  today.  Allergies: No Known Allergies  Current Medications: Current Outpatient Prescriptions  Medication Sig Dispense Refill  . albuterol (PROVENTIL HFA;VENTOLIN HFA) 108 (90 Base) MCG/ACT inhaler Inhale 1-2 puffs into the lungs See admin instructions. Reported on 09/12/2015    . amLODipine (NORVASC) 5 MG tablet Take 1 tablet (5 mg total) by mouth daily. 30 tablet 6  . benzonatate (TESSALON PERLES) 100 MG capsule Take 1 capsule (100 mg total) by mouth 3 (three) times daily as needed for cough. 30 capsule 0  . Calcium Carbonate (CALCIUM 600 PO) Take 1,200 mg by mouth 2 (two) times daily.    . colchicine 0.6 MG tablet Take 0.6 mg by mouth daily as needed (two pills by mouth at onset, the one pill one hour later, maximum three pills  per gout flare).    Marland Kitchen dexamethasone (DECADRON) 4 MG tablet TAKE ONE TABLET BY MOUTH TWICE DAILY THE DAY BEFORE CHEMO, AND THE DAY AFTER CHEMO WITH EACH REGIMEN OF CARBO/ALIMTA 36 tablet 0  . Dextromethorphan Polistirex (DELSYM PO) Take by mouth.    Marland Kitchen FLUoxetine (PROZAC) 20 MG capsule Take 2 capsules (40 mg total) by mouth daily. (Patient taking differently: Take 40 mg by mouth every morning. ) 60 capsule 6  . folic acid (FOLVITE) 1 MG tablet Take 1 tablet (1 mg total) by mouth daily. 30 tablet 3  . lidocaine-prilocaine (EMLA) cream Apply 1 application topically as needed. 30 g 2  . nystatin (NYSTATIN) powder Apply topically 3 (three) times daily as needed. 30 g 1  . ondansetron (ZOFRAN) 8 MG tablet Take 1 tablet  (8 mg total) by mouth every 8 (eight) hours as needed for nausea or vomiting. 20 tablet 3  . oxyCODONE (OXY IR/ROXICODONE) 5 MG immediate release tablet 1 tablet every 4-6 hours as need for pain 30 tablet 0  . sildenafil (VIAGRA) 100 MG tablet Take 1 tablet (100 mg total) by mouth daily as needed for erectile dysfunction. 10 tablet 12  . telmisartan (MICARDIS) 40 MG tablet Take 1 tablet (40 mg total) by mouth daily. 30 tablet 6  . OXYGEN Inhale 2 L into the lungs continuous. Reported on 09/12/2015     No current facility-administered medications for this visit.     Review of Systems:  GENERAL: Feels good. No fevers or sweats.  Active. Weight down 2 pounds since last visit. PERFORMANCE STATUS (ECOG): 1 HEENT: No visual changes, runny nose, sore throat, mouth sores or tenderness. Lungs: No shortness of breath or cough. No hemoptysis.   Cardiac: No chest pain, palpitations, orthopnea, or PND. GI: No current nausea, vomiting, diarrhea, constipation, melena or hematochezia. No prior colonoscopy. GU: No urgency, frequency, dysuria, or hematuria.  Musculoskeletal: No back pain. No joint pain. No muscle tenderness. Extremities: No pain or swelling. Skin: Fungal groin rash.  Noulcers. Neuro: No headache, numbness or weakness, balance or coordination issues. Endocrine: No diabetes, thyroid issues, hot flashes or night sweats. Psych: No mood changes, depression or anxiety. Pain: No focal pain. Review of systems: All other systems reviewed and found to be negative.  Physical Exam: Blood pressure (!) 146/97, pulse 92, temperature (!) 96.6 F (35.9 C), temperature source Tympanic, resp. rate 18, weight 240 lb 1.3 oz (108.9 kg), SpO2 97 %. GENERAL: Well developed, well nourished, heavyset gentleman sitting in the exam room in no acute distress. MENTAL STATUS: Alert and oriented to person, place and time. HEAD: Short dark hair. Goatee. Normocephalic, atraumatic, face symmetric,  no Cushingoid features. EYES: Oval glasses. Pupils equal round and reactive to light and accomodation. No conjunctivitis or scleral icterus. ENT: Oropharynx clear without lesion. Tongue normal. Mucous membranes moist.  RESPIRATORY: Clear to auscultation without rales, wheezes or rhonchi. CARDIOVASCULAR: Regular rate and rhythm without murmur, rub or gallop. ABDOMEN: Soft, non-tender, with active bowel sounds, and no hepatosplenomegaly. No masses. SKIN: Inguinal areas moist with early Candida infection.  Axillary hyperpigmentation, dry.Marland Kitchen EXTREMITIES: Mild chronic lower extremity changes.  No skin discoloration or tenderness. No palpable cords. LYMPH NODES: No palpable cervical, supraclavicular, axillary or inguinal adenopathy  NEUROLOGICAL: Unremarkable. PSYCH: Appropriate.   Appointment on 05/28/2016  Component Date Value Ref Range Status  . WBC 05/28/2016 5.1  3.8 - 10.6 K/uL Final  . RBC 05/28/2016 2.99* 4.40 - 5.90 MIL/uL Final  . Hemoglobin 05/28/2016 10.9* 13.0 -  18.0 g/dL Final  . HCT 05/28/2016 32.0* 40.0 - 52.0 % Final  . MCV 05/28/2016 106.7* 80.0 - 100.0 fL Final  . MCH 05/28/2016 36.4* 26.0 - 34.0 pg Final  . MCHC 05/28/2016 34.1  32.0 - 36.0 g/dL Final  . RDW 05/28/2016 19.0* 11.5 - 14.5 % Final  . Platelets 05/28/2016 217  150 - 440 K/uL Final  . Neutrophils Relative % 05/28/2016 82  % Final  . Neutro Abs 05/28/2016 4.2  1.4 - 6.5 K/uL Final  . Lymphocytes Relative 05/28/2016 11  % Final  . Lymphs Abs 05/28/2016 0.5* 1.0 - 3.6 K/uL Final  . Monocytes Relative 05/28/2016 7  % Final  . Monocytes Absolute 05/28/2016 0.3  0.2 - 1.0 K/uL Final  . Eosinophils Relative 05/28/2016 0  % Final  . Eosinophils Absolute 05/28/2016 0.0  0 - 0.7 K/uL Final  . Basophils Relative 05/28/2016 0  % Final  . Basophils Absolute 05/28/2016 0.0  0 - 0.1 K/uL Final  Appointment on 05/27/2016  Component Date Value Ref Range Status  . WBC 05/27/2016 2.6* 3.8 - 10.6 K/uL Final  .  RBC 05/27/2016 3.02* 4.40 - 5.90 MIL/uL Final  . Hemoglobin 05/27/2016 10.9* 13.0 - 18.0 g/dL Final  . HCT 05/27/2016 32.3* 40.0 - 52.0 % Final  . MCV 05/27/2016 106.8* 80.0 - 100.0 fL Final  . MCH 05/27/2016 36.0* 26.0 - 34.0 pg Final  . MCHC 05/27/2016 33.7  32.0 - 36.0 g/dL Final  . RDW 05/27/2016 19.5* 11.5 - 14.5 % Final  . Platelets 05/27/2016 183  150 - 440 K/uL Final  . Neutrophils Relative % 05/27/2016 62  % Final  . Neutro Abs 05/27/2016 1.6  1.4 - 6.5 K/uL Final  . Lymphocytes Relative 05/27/2016 21  % Final  . Lymphs Abs 05/27/2016 0.6* 1.0 - 3.6 K/uL Final  . Monocytes Relative 05/27/2016 14  % Final  . Monocytes Absolute 05/27/2016 0.4  0.2 - 1.0 K/uL Final  . Eosinophils Relative 05/27/2016 2  % Final  . Eosinophils Absolute 05/27/2016 0.0  0 - 0.7 K/uL Final  . Basophils Relative 05/27/2016 1  % Final  . Basophils Absolute 05/27/2016 0.0  0 - 0.1 K/uL Final  . Sodium 05/27/2016 139  135 - 145 mmol/L Final  . Potassium 05/27/2016 4.6  3.5 - 5.1 mmol/L Final  . Chloride 05/27/2016 102  101 - 111 mmol/L Final  . CO2 05/27/2016 30  22 - 32 mmol/L Final  . Glucose, Bld 05/27/2016 119* 65 - 99 mg/dL Final  . BUN 05/27/2016 15  6 - 20 mg/dL Final  . Creatinine, Ser 05/27/2016 1.47* 0.61 - 1.24 mg/dL Final  . Calcium 05/27/2016 9.3  8.9 - 10.3 mg/dL Final  . Total Protein 05/27/2016 7.9  6.5 - 8.1 g/dL Final  . Albumin 05/27/2016 4.0  3.5 - 5.0 g/dL Final  . AST 05/27/2016 34  15 - 41 U/L Final  . ALT 05/27/2016 26  17 - 63 U/L Final  . Alkaline Phosphatase 05/27/2016 108  38 - 126 U/L Final  . Total Bilirubin 05/27/2016 0.7  0.3 - 1.2 mg/dL Final  . GFR calc non Af Amer 05/27/2016 52* >60 mL/min Final  . GFR calc Af Amer 05/27/2016 >60  >60 mL/min Final   Comment: (NOTE) The eGFR has been calculated using the CKD EPI equation. This calculation has not been validated in all clinical situations. eGFR's persistently <60 mL/min signify possible Chronic Kidney Disease.   Georgiann Hahn gap 05/27/2016  7  5 - 15 Final  . Magnesium 05/27/2016 1.8  1.7 - 2.4 mg/dL Final  . CEA 05/27/2016 512.5* 0.0 - 4.7 ng/mL Final   Comment: (NOTE)       Roche ECLIA methodology       Nonsmokers  <3.9                                     Smokers     <5.6 Performed At: [facility] [place], [place] [id number] Lindon Romp MD [id number]   . Total Protein, Urine 05/27/2016 14  mg/dL Final    Assessment:  Temiloluwa Laredo is a 56 y.o. male with stage IV adenocarcinoma of the right lung.  He has no smoking history.  He presented with with a large right sided pleural effusion and an ill-defined 4.5 x 4.5 cm mass in the right upper lobe. Thoracentesis x 2 of approximately 4.7 liters of blood fluid revealed adenocarcinoma. TTF-1 and Napsin A were immunoreactive and consistent with a lung primary. Pleur-X catheter was placed on 08/13/2015 (removed 09/18/2015).  [facility] testing on 08/18/2015 was negative for:  EGFR, ALK, ROS1 and RET.  Foundation One testing revealed ERBB2 amplification equivocal, RICTOR amplification, APC [id number], CDKN2A/B loss, microsatellite stable, tumor mutation burden intermediate (6 Muts/Mb).  EGFR, K-RAS, ALK, BRAF, MET and RET were negative.  Chest CT angiogram on 08/10/2015 revealed and ill-defined 4.5 x 4.5 cm area of hypodensity in the right upper lobe with small areas of air bronchogram. This was incompletely characterized but is concerning for a centrally obstructing mass. There was complete occlusion of the right upper, right middle, and right lower lobe bronchi with complete collapse of the right lung. There was a large right pleural effusion. There was a lytic lesion involving the right fourth rib compatible with metastatic disease. There was no CT evidence of pulmonary embolism.  PET scan on 08/20/2015 revealed a hypermetabolic 5.2 cm medial right upper lobe lung mass, consistent with primary bronchogenic carcinoma, with a broad  attachment to the medial right upper lobe pleura.  There was interlobular septal thickening throughout the right lung suggested a component of lymphangitic tumor.  There was a malignant small right pleural effusion with hypermetabolism throughout the right pleural space.  There was hypermetabolic ipsilateral hilar, subcarinal and ipsilateral mediastinal nodal metastases.  There was extensive hypermetabolic lytic osseous metastases throughout the axial and proximal appendicular skeleton.    Regarding the skeleton, there were numerous hypermetabolic faintly lytic osseous metastases in the left humeral head (SUV 6.8), right acromion, bilateral ribs most prominent in the anterior right fourth rib  (SUV 16.0), thoracolumbar spine (T11 vertebral body with SUV 9.4), bilateral iliac bones (medial right iliac bone with SUV 12.1 and left anterior acetabulum with SUV 10.0), and right proximal femur (SUV 10.0 in the region of the right lesser trochanter).   Plain films on 08/21/2015 revealed a left humerus lytic lesion with some thinning and some absence of the cortical margin.  He received 800 cGy to the left humerus on 09/17/2015.  CEA was 837.1 on 09/12/2015, 448 on 10/10/2015, 346 on 10/31/2015, 261.3 on 11/21/2015, 726.2 on 01/02/2016, 857.2 on 01/09/2016, 1458.0 on 01/23/2016, 1350.0 on 01/30/2016, 999.6 on 02/13/2016, 870.8 on 03/04/2016, 671.9 on 03/25/2016, 587.6 on 04/16/2016, 510.6 on 05/07/2016, and 512.5 on 05/27/2016.  Anemia work-up on 09/04/2015 revealed a ferritin (519), iron saturation (18%), B12 (3372), and folate (21.9).  He received 4 cycles of Keytruda, carboplatin and Alimta (08/22/2015 - 10/31/2015).   He received 3 cycles of single agent Keytruda (11/21/2015 - 01/02/2016).  Pleur-X catheter was removed on 09/18/2015.    He received 6 additional cycles of carboplatin and Alimta (01/23/2016 -  05/07/2016).  He has received 4 cycles of Avastin (03/05/2016 - 05/07/2016).  He receives B12 every 9  weeks (last 05/07/2016).  He receives monthly Xgeva (began 08/22/2015; last 04/30/2016).   PET scan on 11/20/2015 revealed a marked positive response to therapy.  There was decrease in size and metabolic activity of RIGHT perihilar mass, resolution of mediastinal nodal metabolic activity, and marked decrease in size and metabolic activity of RIGHT anterior chest wall metastasis.  There was decreased in metabolic activity of multiple skeletal metastasis.    PET scan on 01/16/2016 revealed a marked interval progression of disease compared with previous exam.  There was progression of multifocal hypermetabolic bone metastases.  There was the interval enlargement and right lung perihilar mass with development of pleural spread of tumor and lymphangitic spread of tumor within the right lung.  There was new hypermetabolic and enlarged sub-carinal lymph node.  PET scan on 05/05/2016 revealed a positive response to therapy with some regression of the right upper lobe mass, and decreasing lymphangitic spread of disease in the right lung, decreasing malignant right pleural effusion, and a mixed response of osseous lesions (more regression than progression).  A lesion in the posterior aspect of T11 measured 3.0 x 3.4 cm (previously 2.2 x 2.4 cm), but was no longer hypermetabolic.  The largest persistent lesion hypermetabolic lesions are in the pelvis in the posterior aspect of the left ilium and in the right side of the sacrum, are more lytic and sclerotic in appearance, with the largest of these lesions in the right side of the sacrum measuring up to 2.7 cm (SUV 10.5).  He experiences nausea and vomiting for a few days after chemotherapy.  Symptomatically, he feels good today.  Blood pressure is slightly elevated.  Exam reveals a fungal groin rash.  Plan: 1.  Review labs from yesterday.  Repeat CBC today.  WBC has normalized. 2.  Cycle #1 maintenance Alimta + Avastin today. 3.  OnPro Neulasta today. 4.  Xgeva  today. 5.  Rx:  Nystatin powder. 6.  RTC in 3 weeks for MD assessment, labs (CBC with diff, CMP, Mg, CEA, random urine protein-day before), and cycle #7 Alimta, and Avastin.  Addendum:  Blood pressure was rechecked today and in the infusion center.  Improving with each check.  Patient to have blood pressure checked away from clinic and contact office if elevated.   Lequita Asal, MD  05/28/2016, 10:30 AM

## 2016-05-29 LAB — CEA: CEA: 504.1 ng/mL — ABNORMAL HIGH (ref 0.0–4.7)

## 2016-06-17 ENCOUNTER — Inpatient Hospital Stay: Payer: No Typology Code available for payment source | Attending: Hematology and Oncology

## 2016-06-17 DIAGNOSIS — Z5112 Encounter for antineoplastic immunotherapy: Secondary | ICD-10-CM

## 2016-06-17 DIAGNOSIS — B379 Candidiasis, unspecified: Secondary | ICD-10-CM

## 2016-06-17 DIAGNOSIS — I10 Essential (primary) hypertension: Secondary | ICD-10-CM | POA: Diagnosis not present

## 2016-06-17 DIAGNOSIS — Z5111 Encounter for antineoplastic chemotherapy: Secondary | ICD-10-CM

## 2016-06-17 DIAGNOSIS — Z77098 Contact with and (suspected) exposure to other hazardous, chiefly nonmedicinal, chemicals: Secondary | ICD-10-CM | POA: Diagnosis not present

## 2016-06-17 DIAGNOSIS — C3491 Malignant neoplasm of unspecified part of right bronchus or lung: Secondary | ICD-10-CM

## 2016-06-17 DIAGNOSIS — Z79899 Other long term (current) drug therapy: Secondary | ICD-10-CM | POA: Insufficient documentation

## 2016-06-17 DIAGNOSIS — M109 Gout, unspecified: Secondary | ICD-10-CM | POA: Diagnosis not present

## 2016-06-17 DIAGNOSIS — J910 Malignant pleural effusion: Secondary | ICD-10-CM | POA: Diagnosis not present

## 2016-06-17 DIAGNOSIS — R970 Elevated carcinoembryonic antigen [CEA]: Secondary | ICD-10-CM | POA: Diagnosis not present

## 2016-06-17 DIAGNOSIS — Z9981 Dependence on supplemental oxygen: Secondary | ICD-10-CM | POA: Insufficient documentation

## 2016-06-17 DIAGNOSIS — R1110 Vomiting, unspecified: Secondary | ICD-10-CM | POA: Diagnosis not present

## 2016-06-17 DIAGNOSIS — C3411 Malignant neoplasm of upper lobe, right bronchus or lung: Secondary | ICD-10-CM | POA: Insufficient documentation

## 2016-06-17 DIAGNOSIS — C7951 Secondary malignant neoplasm of bone: Secondary | ICD-10-CM

## 2016-06-17 LAB — CBC WITH DIFFERENTIAL/PLATELET
Basophils Absolute: 0.1 10*3/uL (ref 0–0.1)
Basophils Relative: 1 %
Eosinophils Absolute: 0.1 10*3/uL (ref 0–0.7)
Eosinophils Relative: 1 %
HCT: 33.8 % — ABNORMAL LOW (ref 40.0–52.0)
Hemoglobin: 11.5 g/dL — ABNORMAL LOW (ref 13.0–18.0)
Lymphocytes Relative: 14 %
Lymphs Abs: 1.3 10*3/uL (ref 1.0–3.6)
MCH: 36.6 pg — ABNORMAL HIGH (ref 26.0–34.0)
MCHC: 34.1 g/dL (ref 32.0–36.0)
MCV: 107.2 fL — ABNORMAL HIGH (ref 80.0–100.0)
Monocytes Absolute: 0.7 10*3/uL (ref 0.2–1.0)
Monocytes Relative: 7 %
Neutro Abs: 7.7 10*3/uL — ABNORMAL HIGH (ref 1.4–6.5)
Neutrophils Relative %: 77 %
Platelets: 217 10*3/uL (ref 150–440)
RBC: 3.16 MIL/uL — ABNORMAL LOW (ref 4.40–5.90)
RDW: 17.4 % — ABNORMAL HIGH (ref 11.5–14.5)
WBC: 9.9 10*3/uL (ref 3.8–10.6)

## 2016-06-17 LAB — COMPREHENSIVE METABOLIC PANEL
ALT: 28 U/L (ref 17–63)
AST: 31 U/L (ref 15–41)
Albumin: 3.8 g/dL (ref 3.5–5.0)
Alkaline Phosphatase: 135 U/L — ABNORMAL HIGH (ref 38–126)
Anion gap: 7 (ref 5–15)
BUN: 14 mg/dL (ref 6–20)
CO2: 32 mmol/L (ref 22–32)
Calcium: 8.6 mg/dL — ABNORMAL LOW (ref 8.9–10.3)
Chloride: 102 mmol/L (ref 101–111)
Creatinine, Ser: 1.65 mg/dL — ABNORMAL HIGH (ref 0.61–1.24)
GFR calc Af Amer: 52 mL/min — ABNORMAL LOW (ref >60)
GFR calc non Af Amer: 45 mL/min — ABNORMAL LOW (ref >60)
Glucose, Bld: 168 mg/dL — ABNORMAL HIGH (ref 65–99)
Potassium: 3.6 mmol/L (ref 3.5–5.1)
Sodium: 141 mmol/L (ref 135–145)
Total Bilirubin: 0.5 mg/dL (ref 0.3–1.2)
Total Protein: 7.5 g/dL (ref 6.5–8.1)

## 2016-06-17 LAB — PROTEIN, URINE, RANDOM: Total Protein, Urine: 22 mg/dL

## 2016-06-17 LAB — MAGNESIUM: Magnesium: 2.1 mg/dL (ref 1.7–2.4)

## 2016-06-18 ENCOUNTER — Inpatient Hospital Stay: Payer: No Typology Code available for payment source

## 2016-06-18 ENCOUNTER — Inpatient Hospital Stay (HOSPITAL_BASED_OUTPATIENT_CLINIC_OR_DEPARTMENT_OTHER): Payer: No Typology Code available for payment source | Admitting: Oncology

## 2016-06-18 ENCOUNTER — Other Ambulatory Visit: Payer: Self-pay | Admitting: *Deleted

## 2016-06-18 ENCOUNTER — Other Ambulatory Visit: Payer: Self-pay | Admitting: Hematology and Oncology

## 2016-06-18 VITALS — BP 152/92 | HR 99 | Temp 96.7°F | Resp 18 | Wt 239.9 lb

## 2016-06-18 DIAGNOSIS — C3491 Malignant neoplasm of unspecified part of right bronchus or lung: Secondary | ICD-10-CM

## 2016-06-18 DIAGNOSIS — R970 Elevated carcinoembryonic antigen [CEA]: Secondary | ICD-10-CM

## 2016-06-18 DIAGNOSIS — Z5112 Encounter for antineoplastic immunotherapy: Secondary | ICD-10-CM

## 2016-06-18 DIAGNOSIS — M109 Gout, unspecified: Secondary | ICD-10-CM | POA: Diagnosis not present

## 2016-06-18 DIAGNOSIS — Z9981 Dependence on supplemental oxygen: Secondary | ICD-10-CM | POA: Diagnosis not present

## 2016-06-18 DIAGNOSIS — C3411 Malignant neoplasm of upper lobe, right bronchus or lung: Secondary | ICD-10-CM

## 2016-06-18 DIAGNOSIS — C7951 Secondary malignant neoplasm of bone: Secondary | ICD-10-CM

## 2016-06-18 DIAGNOSIS — Z77098 Contact with and (suspected) exposure to other hazardous, chiefly nonmedicinal, chemicals: Secondary | ICD-10-CM | POA: Diagnosis not present

## 2016-06-18 DIAGNOSIS — B379 Candidiasis, unspecified: Secondary | ICD-10-CM

## 2016-06-18 DIAGNOSIS — J910 Malignant pleural effusion: Secondary | ICD-10-CM

## 2016-06-18 DIAGNOSIS — R1110 Vomiting, unspecified: Secondary | ICD-10-CM

## 2016-06-18 DIAGNOSIS — R978 Other abnormal tumor markers: Secondary | ICD-10-CM

## 2016-06-18 DIAGNOSIS — Z79899 Other long term (current) drug therapy: Secondary | ICD-10-CM | POA: Diagnosis not present

## 2016-06-18 DIAGNOSIS — Z5111 Encounter for antineoplastic chemotherapy: Secondary | ICD-10-CM

## 2016-06-18 LAB — CEA: CEA: 596.3 ng/mL — ABNORMAL HIGH (ref 0.0–4.7)

## 2016-06-18 MED ORDER — PALONOSETRON HCL INJECTION 0.25 MG/5ML
0.2500 mg | Freq: Once | INTRAVENOUS | Status: AC
  Administered 2016-06-18: 0.25 mg via INTRAVENOUS
  Filled 2016-06-18: qty 5

## 2016-06-18 MED ORDER — PEGFILGRASTIM 6 MG/0.6ML ~~LOC~~ PSKT
6.0000 mg | PREFILLED_SYRINGE | Freq: Once | SUBCUTANEOUS | Status: AC
  Administered 2016-06-18: 6 mg via SUBCUTANEOUS
  Filled 2016-06-18: qty 0.6

## 2016-06-18 MED ORDER — CYANOCOBALAMIN 1000 MCG/ML IJ SOLN
1000.0000 ug | Freq: Once | INTRAMUSCULAR | Status: AC
  Administered 2016-06-18: 1000 ug via INTRAMUSCULAR
  Filled 2016-06-18: qty 1

## 2016-06-18 MED ORDER — OXYCODONE HCL 5 MG PO TABS
ORAL_TABLET | ORAL | 0 refills | Status: DC
Start: 2016-06-18 — End: 2016-07-23

## 2016-06-18 MED ORDER — SODIUM CHLORIDE 0.9 % IV SOLN
Freq: Once | INTRAVENOUS | Status: AC
  Administered 2016-06-18: 10:00:00 via INTRAVENOUS
  Filled 2016-06-18: qty 1000

## 2016-06-18 MED ORDER — SODIUM CHLORIDE 0.9 % IV SOLN
1100.0000 mg | Freq: Once | INTRAVENOUS | Status: AC
  Administered 2016-06-18: 1100 mg via INTRAVENOUS
  Filled 2016-06-18: qty 40

## 2016-06-18 MED ORDER — SODIUM CHLORIDE 0.9 % IV SOLN
1600.0000 mg | Freq: Once | INTRAVENOUS | Status: AC
  Administered 2016-06-18: 1600 mg via INTRAVENOUS
  Filled 2016-06-18: qty 64

## 2016-06-18 MED ORDER — HEPARIN SOD (PORK) LOCK FLUSH 100 UNIT/ML IV SOLN
500.0000 [IU] | Freq: Once | INTRAVENOUS | Status: AC | PRN
  Administered 2016-06-18: 500 [IU]
  Filled 2016-06-18: qty 5

## 2016-06-18 MED ORDER — SODIUM CHLORIDE 0.9% FLUSH
10.0000 mL | INTRAVENOUS | Status: DC | PRN
  Administered 2016-06-18: 10 mL
  Filled 2016-06-18: qty 10

## 2016-06-18 MED ORDER — DEXAMETHASONE SODIUM PHOSPHATE 10 MG/ML IJ SOLN
10.0000 mg | Freq: Once | INTRAMUSCULAR | Status: AC
  Administered 2016-06-18: 10 mg via INTRAVENOUS
  Filled 2016-06-18: qty 1

## 2016-06-18 NOTE — Progress Notes (Signed)
Hematology/Oncology Consult note [facility]  Telephone:([phone] [phone]  Patient Care Team: Guadalupe Maple, MD as PCP - General (Family Medicine) Nestor Lewandowsky, MD as Referring Physician (Cardiothoracic Surgery) Laverle Hobby, MD as Consulting Physician (Pulmonary Disease)   Name of the patient: Stuart Spence  [id number]  [birth year]   Date of visit: 06/18/16  Diagnosis- metastatic adenocarcinoma of lung stage IV  Chief complaint/ Reason for visit- on treatment assessment prior to cycle # 2 of maintenance alimta/ avastin  Heme/Onc history: Stuart Spence is a 56 y.o. male with stage IV adenocarcinoma of the right lung.  He has no smoking history.  He presented with with a large right sided pleural effusion and an ill-defined 4.5 x 4.5 cm mass in the right upper lobe. Thoracentesis x 2 of approximately 4.7 liters of blood fluid revealed adenocarcinoma. TTF-1 and Napsin A were immunoreactive and consistent with a lung primary. Pleur-X catheter was placed on 08/13/2015 (removed 09/18/2015).  [facility] testing on 08/18/2015 was negative for:  EGFR, ALK, ROS1 and RET.  Foundation One testing revealed ERBB2 amplification equivocal, RICTOR amplification, APC [id number], CDKN2A/B loss, microsatellite stable, tumor mutation burden intermediate (6 Muts/Mb).  EGFR, K-RAS, ALK, BRAF, MET and RET were negative.  Chest CT angiogram on 08/10/2015 revealed and ill-defined 4.5 x 4.5 cm area of hypodensity in the right upper lobe with small areas of air bronchogram. This was incompletely characterized but is concerning for a centrally obstructing mass. There was complete occlusion of the right upper, right middle, and right lower lobe bronchi with complete collapse of the right lung. There was a large right pleural effusion. There was a lytic lesion involving the right fourth rib compatible with metastatic disease. There was no CT evidence of pulmonary  embolism.  PET scan on 08/20/2015 revealed a hypermetabolic 5.2 cm medial right upper lobe lung mass, consistent with primary bronchogenic carcinoma, with a broad attachment to the medial right upper lobe pleura.  There was interlobular septal thickening throughout the right lung suggested a component of lymphangitic tumor.  There was a malignant small right pleural effusion with hypermetabolism throughout the right pleural space.  There was hypermetabolic ipsilateral hilar, subcarinal and ipsilateral mediastinal nodal metastases.  There was extensive hypermetabolic lytic osseous metastases throughout the axial and proximal appendicular skeleton.    Regarding the skeleton, there were numerous hypermetabolic faintly lytic osseous metastases in the left humeral head (SUV 6.8), right acromion, bilateral ribs most prominent in the anterior right fourth rib  (SUV 16.0), thoracolumbar spine (T11 vertebral body with SUV 9.4), bilateral iliac bones (medial right iliac bone with SUV 12.1 and left anterior acetabulum with SUV 10.0), and right proximal femur (SUV 10.0 in the region of the right lesser trochanter).   Plain films on 08/21/2015 revealed a left humerus lytic lesion with some thinning and some absence of the cortical margin.  He received 800 cGy to the left humerus on 09/17/2015.  CEA was 837.1 on 09/12/2015, 448 on 10/10/2015, 346 on 10/31/2015, 261.3 on 11/21/2015, 726.2 on 01/02/2016, 857.2 on 01/09/2016, 1458.0 on 01/23/2016, 1350.0 on 01/30/2016, 999.6 on 02/13/2016, 870.8 on 03/04/2016, 671.9 on 03/25/2016, 587.6 on 04/16/2016, 510.6 on 05/07/2016, and 512.5 on 05/27/2016.  Anemia work-up on 09/04/2015 revealed a ferritin (519), iron saturation (18%), B12 (3372), and folate (21.9).  He received 4 cycles of Keytruda, carboplatin and Alimta (08/22/2015 - 10/31/2015).   He received 3 cycles of single agent Keytruda (11/21/2015 - 01/02/2016).  Pleur-X catheter was removed on  09/18/2015.    He  received 6 additional cycles of carboplatin and Alimta (01/23/2016 -  05/07/2016).  He has received 4 cycles of Avastin (03/05/2016 - 05/07/2016).  He receives B12 every 9 weeks (last 05/07/2016).  He receives monthly Xgeva (began 08/22/2015; last 04/30/2016).   PET scan on 11/20/2015 revealed a marked positive response to therapy.  There was decrease in size and metabolic activity of RIGHT perihilar mass, resolution of mediastinal nodal metabolic activity, and marked decrease in size and metabolic activity of RIGHT anterior chest wall metastasis.  There was decreased in metabolic activity of multiple skeletal metastasis.    PET scan on 01/16/2016 revealed a marked interval progression of disease compared with previous exam.  There was progression of multifocal hypermetabolic bone metastases.  There was the interval enlargement and right lung perihilar mass with development of pleural spread of tumor and lymphangitic spread of tumor within the right lung.  There was new hypermetabolic and enlarged sub-carinal lymph node.  PET scan on 05/05/2016 revealed a positive response to therapy with some regression of the right upper lobe mass, and decreasing lymphangitic spread of disease in the right lung, decreasing malignant right pleural effusion, and a mixed response of osseous lesions (more regression than progression).  A lesion in the posterior aspect of T11 measured 3.0 x 3.4 cm (previously 2.2 x 2.4 cm), but was no longer hypermetabolic.  The largest persistent lesion hypermetabolic lesions are in the pelvis in the posterior aspect of the left ilium and in the right side of the sacrum, are more lytic and sclerotic in appearance, with the largest of these lesions in the right side of the sacrum measuring up to 2.7 cm (SUV 10.5).   Interval history- reports doing well overall. He is keeping up with PO intake and fludis. His mother recently passed away and he was stressed sceondary to that and had a few  bouts of vomiting  ECOG PS- 1 Pain scale- 0 Opioid associated constipation- no  Review of systems- Review of Systems  Constitutional: Negative for chills, fever, malaise/fatigue and weight loss.  HENT: Negative for congestion, ear discharge and nosebleeds.   Eyes: Negative for blurred vision.  Respiratory: Negative for cough, hemoptysis, sputum production, shortness of breath and wheezing.   Cardiovascular: Negative for chest pain, palpitations, orthopnea and claudication.  Gastrointestinal: Negative for abdominal pain, blood in stool, constipation, diarrhea, heartburn, melena, nausea and vomiting.  Genitourinary: Negative for dysuria, flank pain, frequency, hematuria and urgency.  Musculoskeletal: Negative for back pain, joint pain and myalgias.  Skin: Negative for rash.  Neurological: Negative for dizziness, tingling, focal weakness, seizures, weakness and headaches.  Endo/Heme/Allergies: Does not bruise/bleed easily.  Psychiatric/Behavioral: Negative for depression and suicidal ideas. The patient does not have insomnia.      Current treatment- alimta/ avastin Q3 weeks  No Known Allergies   Past Medical History:  Diagnosis Date  . Cancer ([place])    Lung  . Depression   . ED (erectile dysfunction)   . Gout   . Hypertension   . On home oxygen therapy   . Personal history of chemotherapy    PT TAKING CHEMO EVERY 3 WEEKS     Past Surgical History:  Procedure Laterality Date  . CHEST TUBE INSERTION Left 08/13/2015   Procedure: CHEST TUBE INSERTION;  Surgeon: Nestor Lewandowsky, MD;  Location: [facility] ORS;  Service: General;  Laterality: Left;  . CHEST TUBE INSERTION N/A 09/18/2015   Procedure: PLEURX CATH REMOVAL;  Surgeon: Nestor Lewandowsky, MD;  Location: [facility] ORS;  Service: Thoracic;  Laterality: N/A;  . PORTACATH PLACEMENT Left 08/13/2015   Procedure: INSERTION PORT-A-CATH;  Surgeon: Nestor Lewandowsky, MD;  Location: [facility] ORS;  Service: General;  Laterality: Left;  . PORTACATH PLACEMENT      . TONSILLECTOMY      Social History   Social History  . Marital status: Married    Spouse name: N/A  . Number of children: N/A  . Years of education: N/A   Occupational History  . Not on file.   Social History Main Topics  . Smoking status: Never Smoker  . Smokeless tobacco: Never Used  . Alcohol use No     Comment: gave it up in August 2016  . Drug use: No  . Sexual activity: Not on file   Other Topics Concern  . Not on file   Social History Narrative  . No narrative on file    Family History  Problem Relation Age of Onset  . Cancer Mother 105    lung  . Heart attack Father 29  . Hypertension Father   . Congestive Heart Failure Father 63    died from  . Diabetes Sister      Current Outpatient Prescriptions:  .  albuterol (PROVENTIL HFA;VENTOLIN HFA) 108 (90 Base) MCG/ACT inhaler, Inhale 1-2 puffs into the lungs See admin instructions. Reported on 09/12/2015, Disp: , Rfl:  .  amLODipine (NORVASC) 5 MG tablet, Take 1 tablet (5 mg total) by mouth daily., Disp: 30 tablet, Rfl: 6 .  benzonatate (TESSALON PERLES) 100 MG capsule, Take 1 capsule (100 mg total) by mouth 3 (three) times daily as needed for cough., Disp: 30 capsule, Rfl: 0 .  Calcium Carbonate (CALCIUM 600 PO), Take 1,200 mg by mouth 2 (two) times daily., Disp: , Rfl:  .  colchicine 0.6 MG tablet, Take 0.6 mg by mouth daily as needed (two pills by mouth at onset, the one pill one hour later, maximum three pills per gout flare)., Disp: , Rfl:  .  dexamethasone (DECADRON) 4 MG tablet, TAKE ONE TABLET BY MOUTH TWICE DAILY THE DAY BEFORE CHEMO, AND THE DAY AFTER CHEMO WITH EACH REGIMEN OF CARBO/ALIMTA, Disp: 36 tablet, Rfl: 0 .  Dextromethorphan Polistirex (DELSYM PO), Take by mouth., Disp: , Rfl:  .  FLUoxetine (PROZAC) 20 MG capsule, Take 2 capsules (40 mg total) by mouth daily. (Patient taking differently: Take 40 mg by mouth every morning. ), Disp: 60 capsule, Rfl: 6 .  folic acid (FOLVITE) 1 MG tablet, Take 1  tablet (1 mg total) by mouth daily., Disp: 30 tablet, Rfl: 3 .  lidocaine-prilocaine (EMLA) cream, Apply 1 application topically as needed., Disp: 30 g, Rfl: 2 .  nystatin (NYSTATIN) powder, Apply topically 3 (three) times daily as needed., Disp: 30 g, Rfl: 1 .  ondansetron (ZOFRAN) 8 MG tablet, Take 1 tablet (8 mg total) by mouth every 8 (eight) hours as needed for nausea or vomiting., Disp: 20 tablet, Rfl: 3 .  oxyCODONE (OXY IR/ROXICODONE) 5 MG immediate release tablet, 1 tablet every 4-6 hours as need for pain, Disp: 30 tablet, Rfl: 0 .  OXYGEN, Inhale 2 L into the lungs continuous. Reported on 09/12/2015, Disp: , Rfl:  .  sildenafil (VIAGRA) 100 MG tablet, Take 1 tablet (100 mg total) by mouth daily as needed for erectile dysfunction., Disp: 10 tablet, Rfl: 12 .  telmisartan (MICARDIS) 40 MG tablet, Take 1 tablet (40 mg total) by mouth daily., Disp: 30 tablet, Rfl: 6  Physical  exam:  Vitals:   06/18/16 0843  BP: (!) 152/92  Pulse: 99  Resp: 18  Temp: (!) 96.7 F (35.9 C)  TempSrc: Tympanic  Weight: 239 lb 13.8 oz (108.8 kg)   Physical Exam  Constitutional: He is oriented to person, place, and time and well-developed, well-nourished, and in no distress.  HENT:  Head: Normocephalic and atraumatic.  Eyes: EOM are normal. Pupils are equal, round, and reactive to light.  Neck: Normal range of motion.  Cardiovascular: Normal rate, regular rhythm and normal heart sounds.   Pulmonary/Chest: Effort normal and breath sounds normal.  Abdominal: Soft. Bowel sounds are normal.  Neurological: He is alert and oriented to person, place, and time.  Skin: Skin is warm and dry.     CMP Latest Ref Rng & Units 06/17/2016  Glucose 65 - 99 mg/dL 168(H)  BUN 6 - 20 mg/dL 14  Creatinine 0.61 - 1.24 mg/dL 1.65(H)  Sodium 135 - 145 mmol/L 141  Potassium 3.5 - 5.1 mmol/L 3.6  Chloride 101 - 111 mmol/L 102  CO2 22 - 32 mmol/L 32  Calcium 8.9 - 10.3 mg/dL 8.6(L)  Total Protein 6.5 - 8.1 g/dL 7.5    Total Bilirubin 0.3 - 1.2 mg/dL 0.5  Alkaline Phos 38 - 126 U/L 135(H)  AST 15 - 41 U/L 31  ALT 17 - 63 U/L 28   CBC Latest Ref Rng & Units 06/17/2016  WBC 3.8 - 10.6 K/uL 9.9  Hemoglobin 13.0 - 18.0 g/dL 11.5(L)  Hematocrit 40.0 - 52.0 % 33.8(L)  Platelets 150 - 440 K/uL 217      Assessment and plan- Patient is a 56 y.o. male with stage IV adenocarcinoma of lung here for on treatment assessment for maintenance alimta/ avastin  1.  Cycle #2 maintenance Alimta + Avastin today. 2.  OnPro Neulasta today. 3.  Xgeva next week. I have verified with pharmacy that it would be ok to give him xgeva with his renal functions 4.  CEA has increased to 596 from 504 3 weeks ago. We will plan to repeat PET CT prior to next cycle of chemotherapy.  5.  RTC in 3 weeks for MD assessment, labs (CBC with diff, CMP, Mg, CEA, random urine protein-day before), and cycle #3 of maintenance Alimta, and Avastin.   Visit Diagnosis 1. Adenocarcinoma of right lung ([place])   2. Bone metastasis ([place])   3. Elevated tumor markers   4. Encounter for antineoplastic chemotherapy      Dr. Randa Evens, MD, MPH [facility] at [facility] Pager- [phone] 06/18/2016 10:45 AM

## 2016-06-18 NOTE — Progress Notes (Signed)
Patient states when he gets upset he throws up.  Recently lost his step mother and when he got the news he got sick.  Has nausea medication but states this comes on suddenly.

## 2016-06-19 ENCOUNTER — Other Ambulatory Visit: Payer: Self-pay | Admitting: Family Medicine

## 2016-06-22 ENCOUNTER — Ambulatory Visit (INDEPENDENT_AMBULATORY_CARE_PROVIDER_SITE_OTHER): Payer: PRIVATE HEALTH INSURANCE | Admitting: Family Medicine

## 2016-06-22 ENCOUNTER — Encounter: Payer: Self-pay | Admitting: Family Medicine

## 2016-06-22 VITALS — BP 113/78 | HR 120 | Ht 66.14 in | Wt 235.0 lb

## 2016-06-22 DIAGNOSIS — Z13220 Encounter for screening for lipoid disorders: Secondary | ICD-10-CM | POA: Diagnosis not present

## 2016-06-22 DIAGNOSIS — Z125 Encounter for screening for malignant neoplasm of prostate: Secondary | ICD-10-CM

## 2016-06-22 DIAGNOSIS — Z1329 Encounter for screening for other suspected endocrine disorder: Secondary | ICD-10-CM

## 2016-06-22 DIAGNOSIS — B029 Zoster without complications: Secondary | ICD-10-CM | POA: Diagnosis not present

## 2016-06-22 DIAGNOSIS — I10 Essential (primary) hypertension: Secondary | ICD-10-CM | POA: Diagnosis not present

## 2016-06-22 DIAGNOSIS — F329 Major depressive disorder, single episode, unspecified: Secondary | ICD-10-CM | POA: Diagnosis not present

## 2016-06-22 DIAGNOSIS — F32A Depression, unspecified: Secondary | ICD-10-CM

## 2016-06-22 DIAGNOSIS — Z0000 Encounter for general adult medical examination without abnormal findings: Secondary | ICD-10-CM | POA: Diagnosis not present

## 2016-06-22 DIAGNOSIS — C3491 Malignant neoplasm of unspecified part of right bronchus or lung: Secondary | ICD-10-CM | POA: Diagnosis not present

## 2016-06-22 DIAGNOSIS — Z1211 Encounter for screening for malignant neoplasm of colon: Secondary | ICD-10-CM | POA: Diagnosis not present

## 2016-06-22 LAB — URINALYSIS, ROUTINE W REFLEX MICROSCOPIC
BILIRUBIN UA: NEGATIVE
GLUCOSE, UA: NEGATIVE
KETONES UA: NEGATIVE
LEUKOCYTES UA: NEGATIVE
Nitrite, UA: NEGATIVE
SPEC GRAV UA: 1.01 (ref 1.005–1.030)
Urobilinogen, Ur: 0.2 mg/dL (ref 0.2–1.0)
pH, UA: 5.5 (ref 5.0–7.5)

## 2016-06-22 LAB — MICROSCOPIC EXAMINATION
BACTERIA UA: NONE SEEN
RBC, UA: NONE SEEN /hpf (ref 0–?)

## 2016-06-22 MED ORDER — PREDNISONE 10 MG PO TABS: ORAL_TABLET | ORAL | 0 refills | Status: DC

## 2016-06-22 MED ORDER — AMLODIPINE BESYLATE 5 MG PO TABS: 5.0000 mg | ORAL_TABLET | Freq: Every day | ORAL | 12 refills | Status: DC

## 2016-06-22 MED ORDER — TELMISARTAN 40 MG PO TABS: 40.0000 mg | ORAL_TABLET | Freq: Every day | ORAL | 12 refills | Status: DC

## 2016-06-22 MED ORDER — FLUOXETINE HCL 20 MG PO CAPS: 40.0000 mg | ORAL_CAPSULE | ORAL | 12 refills | Status: AC

## 2016-06-22 NOTE — Assessment & Plan Note (Signed)
Reviewed notes from the [facility] patient with positive response from PET scan done in January.

## 2016-06-22 NOTE — Assessment & Plan Note (Signed)
The current medical regimen is effective;  continue present plan and medications.  

## 2016-06-22 NOTE — Assessment & Plan Note (Signed)
Discussed shingles care and treatment with 5 days already passed will not use antiviral medications but will give prednisone to assist with pain management and to help with possible prevention of postherpetic neuralgia.

## 2016-06-22 NOTE — Progress Notes (Signed)
BP 113/78   Pulse (!) 120   Ht 5' 6.14" (1.68 m)   Wt 235 lb (106.6 kg)   SpO2 94%   BMI 37.77 kg/m    Subjective:    Patient ID: Stuart Spence, male    DOB: 04-02-1961, 56 y.o.   MRN: [id number]  HPI: Stuart Spence is a 55 y.o. male  Chief Complaint  Patient presents with  . Annual Exam  . Rash    Under Left breast, and midline   Patient with rash been present for 5 days and left T8 distribution does not cross midline rash classic distribution of shingles. Patient followed by the [facility] for adenocarcinoma of his lungs. That is currently stable. Blood pressure taking medications without problems.  Relevant past medical, surgical, family and social history reviewed and updated as indicated. Interim medical history since our last visit reviewed. Allergies and medications reviewed and updated.  Review of Systems  Constitutional: Negative.   HENT: Negative.   Eyes: Negative.   Respiratory: Negative.   Cardiovascular: Negative.   Gastrointestinal: Negative.   Endocrine: Negative.   Genitourinary: Negative.   Musculoskeletal: Negative.   Skin: Negative.   Allergic/Immunologic: Negative.   Neurological: Negative.   Hematological: Negative.   Psychiatric/Behavioral: Negative.     Per HPI unless specifically indicated above     Objective:    BP 113/78   Pulse (!) 120   Ht 5' 6.14" (1.68 m)   Wt 235 lb (106.6 kg)   SpO2 94%   BMI 37.77 kg/m   Wt Readings from Last 3 Encounters:  06/22/16 235 lb (106.6 kg)  06/18/16 239 lb 13.8 oz (108.8 kg)  05/28/16 240 lb 1.3 oz (108.9 kg)    Physical Exam  Constitutional: He is oriented to person, place, and time. He appears well-developed and well-nourished.  HENT:  Head: Normocephalic and atraumatic.  Right Ear: External ear normal.  Left Ear: External ear normal.  Eyes: Conjunctivae and EOM are normal. Pupils are equal, round, and reactive to light.  Neck: Normal range of motion. Neck supple.  Cardiovascular:  Normal rate, regular rhythm, normal heart sounds and intact distal pulses.   Pulmonary/Chest: Effort normal and breath sounds normal.  Abdominal: Soft. Bowel sounds are normal. There is no splenomegaly or hepatomegaly.  Genitourinary: Penis normal.  Genitourinary Comments: Not done   Musculoskeletal: Normal range of motion.  Neurological: He is alert and oriented to person, place, and time. He has normal reflexes.  Skin: No rash noted. No erythema.  Psychiatric: He has a normal mood and affect. His behavior is normal. Judgment and thought content normal.    Results for orders placed or performed in visit on 06/17/16  CBC with Differential/Platelet  Result Value Ref Range   WBC 9.9 3.8 - 10.6 K/uL   RBC 3.16 (L) 4.40 - 5.90 MIL/uL   Hemoglobin 11.5 (L) 13.0 - 18.0 g/dL   HCT 33.8 (L) 40.0 - 52.0 %   MCV 107.2 (H) 80.0 - 100.0 fL   MCH 36.6 (H) 26.0 - 34.0 pg   MCHC 34.1 32.0 - 36.0 g/dL   RDW 17.4 (H) 11.5 - 14.5 %   Platelets 217 150 - 440 K/uL   Neutrophils Relative % 77 %   Neutro Abs 7.7 (H) 1.4 - 6.5 K/uL   Lymphocytes Relative 14 %   Lymphs Abs 1.3 1.0 - 3.6 K/uL   Monocytes Relative 7 %   Monocytes Absolute 0.7 0.2 - 1.0 K/uL   Eosinophils Relative  1 %   Eosinophils Absolute 0.1 0 - 0.7 K/uL   Basophils Relative 1 %   Basophils Absolute 0.1 0 - 0.1 K/uL  Comprehensive metabolic panel  Result Value Ref Range   Sodium 141 135 - 145 mmol/L   Potassium 3.6 3.5 - 5.1 mmol/L   Chloride 102 101 - 111 mmol/L   CO2 32 22 - 32 mmol/L   Glucose, Bld 168 (H) 65 - 99 mg/dL   BUN 14 6 - 20 mg/dL   Creatinine, Ser 1.65 (H) 0.61 - 1.24 mg/dL   Calcium 8.6 (L) 8.9 - 10.3 mg/dL   Total Protein 7.5 6.5 - 8.1 g/dL   Albumin 3.8 3.5 - 5.0 g/dL   AST 31 15 - 41 U/L   ALT 28 17 - 63 U/L   Alkaline Phosphatase 135 (H) 38 - 126 U/L   Total Bilirubin 0.5 0.3 - 1.2 mg/dL   GFR calc non Af Amer 45 (L) >60 mL/min   GFR calc Af Amer 52 (L) >60 mL/min   Anion gap 7 5 - 15  Magnesium    Result Value Ref Range   Magnesium 2.1 1.7 - 2.4 mg/dL  CEA  Result Value Ref Range   CEA 596.3 (H) 0.0 - 4.7 ng/mL  Protein, urine, random  Result Value Ref Range   Total Protein, Urine 22 mg/dL      Assessment & Plan:   Problem List Items Addressed This Visit      Cardiovascular and Mediastinum   Essential hypertension    The current medical regimen is effective;  continue present plan and medications.       Relevant Medications   amLODipine (NORVASC) 5 MG tablet   telmisartan (MICARDIS) 40 MG tablet   Other Relevant Orders   Urinalysis, Routine w reflex microscopic     Respiratory   Adenocarcinoma, lung ([place])    Reviewed notes from the [place] patient with positive response from PET scan done in January.      Relevant Medications   predniSONE (DELTASONE) 10 MG tablet     Other   Depression    The current medical regimen is effective;  continue present plan and medications.       Relevant Medications   FLUoxetine (PROZAC) 20 MG capsule   Shingles    Discussed shingles care and treatment with 5 days already passed will not use antiviral medications but will give prednisone to assist with pain management and to help with possible prevention of postherpetic neuralgia.      Relevant Medications   predniSONE (DELTASONE) 10 MG tablet    Other Visit Diagnoses    Annual physical exam    -  Primary   Relevant Orders   Lipid panel   PSA   TSH   Urinalysis, Routine w reflex microscopic   Screening cholesterol level       Relevant Orders   Lipid panel   Prostate cancer screening       Relevant Orders   PSA   Thyroid disorder screen       Relevant Orders   TSH   Colon cancer screening          Canceled GI appointment until through with chemotherapy.  Follow up plan: Return in about 6 months (around 12/23/2016) for BMP.

## 2016-06-23 ENCOUNTER — Other Ambulatory Visit: Payer: Self-pay

## 2016-06-23 ENCOUNTER — Encounter: Payer: Self-pay | Admitting: Family Medicine

## 2016-06-23 ENCOUNTER — Emergency Department: Payer: No Typology Code available for payment source

## 2016-06-23 ENCOUNTER — Telehealth: Payer: Self-pay | Admitting: *Deleted

## 2016-06-23 ENCOUNTER — Observation Stay
Admission: EM | Admit: 2016-06-23 | Discharge: 2016-06-25 | Disposition: A | Discharge status: Home / Self Care | Payer: No Typology Code available for payment source | Attending: Specialist | Admitting: Specialist

## 2016-06-23 DIAGNOSIS — J90 Pleural effusion, not elsewhere classified: Secondary | ICD-10-CM | POA: Insufficient documentation

## 2016-06-23 DIAGNOSIS — Z5111 Encounter for antineoplastic chemotherapy: Secondary | ICD-10-CM | POA: Diagnosis not present

## 2016-06-23 DIAGNOSIS — M109 Gout, unspecified: Secondary | ICD-10-CM | POA: Diagnosis not present

## 2016-06-23 DIAGNOSIS — Z833 Family history of diabetes mellitus: Secondary | ICD-10-CM | POA: Diagnosis not present

## 2016-06-23 DIAGNOSIS — C3491 Malignant neoplasm of unspecified part of right bronchus or lung: Secondary | ICD-10-CM | POA: Insufficient documentation

## 2016-06-23 DIAGNOSIS — Z8249 Family history of ischemic heart disease and other diseases of the circulatory system: Secondary | ICD-10-CM | POA: Insufficient documentation

## 2016-06-23 DIAGNOSIS — F329 Major depressive disorder, single episode, unspecified: Secondary | ICD-10-CM | POA: Insufficient documentation

## 2016-06-23 DIAGNOSIS — Z9981 Dependence on supplemental oxygen: Secondary | ICD-10-CM | POA: Insufficient documentation

## 2016-06-23 DIAGNOSIS — Z801 Family history of malignant neoplasm of trachea, bronchus and lung: Secondary | ICD-10-CM | POA: Insufficient documentation

## 2016-06-23 DIAGNOSIS — K219 Gastro-esophageal reflux disease without esophagitis: Secondary | ICD-10-CM | POA: Diagnosis not present

## 2016-06-23 DIAGNOSIS — I10 Essential (primary) hypertension: Secondary | ICD-10-CM | POA: Diagnosis not present

## 2016-06-23 DIAGNOSIS — B0229 Other postherpetic nervous system involvement: Secondary | ICD-10-CM | POA: Diagnosis not present

## 2016-06-23 DIAGNOSIS — J449 Chronic obstructive pulmonary disease, unspecified: Secondary | ICD-10-CM | POA: Diagnosis not present

## 2016-06-23 DIAGNOSIS — C7951 Secondary malignant neoplasm of bone: Secondary | ICD-10-CM | POA: Insufficient documentation

## 2016-06-23 DIAGNOSIS — I491 Atrial premature depolarization: Secondary | ICD-10-CM | POA: Diagnosis not present

## 2016-06-23 DIAGNOSIS — R531 Weakness: Secondary | ICD-10-CM

## 2016-06-23 DIAGNOSIS — N289 Disorder of kidney and ureter, unspecified: Secondary | ICD-10-CM | POA: Insufficient documentation

## 2016-06-23 DIAGNOSIS — Z79899 Other long term (current) drug therapy: Secondary | ICD-10-CM | POA: Diagnosis not present

## 2016-06-23 DIAGNOSIS — K625 Hemorrhage of anus and rectum: Principal | ICD-10-CM | POA: Insufficient documentation

## 2016-06-23 DIAGNOSIS — K5900 Constipation, unspecified: Secondary | ICD-10-CM | POA: Diagnosis not present

## 2016-06-23 DIAGNOSIS — K8020 Calculus of gallbladder without cholecystitis without obstruction: Secondary | ICD-10-CM | POA: Diagnosis not present

## 2016-06-23 LAB — PSA: Prostate Specific Ag, Serum: 0.9 ng/mL (ref 0.0–4.0)

## 2016-06-23 LAB — CBC WITH DIFFERENTIAL/PLATELET
BASOS: 0 %
Basophils Absolute: 0 10*3/uL (ref 0.0–0.2)
EOS (ABSOLUTE): 0.1 10*3/uL (ref 0.0–0.4)
Eos: 1 %
Hematocrit: 35.2 % — ABNORMAL LOW (ref 37.5–51.0)
Hemoglobin: 11.4 g/dL — ABNORMAL LOW (ref 13.0–17.7)
IMMATURE GRANS (ABS): 0.7 10*3/uL — AB (ref 0.0–0.1)
Immature Granulocytes: 3 %
LYMPHS: 5 %
Lymphocytes Absolute: 1.4 10*3/uL (ref 0.7–3.1)
MCH: 34.9 pg — ABNORMAL HIGH (ref 26.6–33.0)
MCHC: 32.4 g/dL (ref 31.5–35.7)
MCV: 108 fL — AB (ref 79–97)
Monocytes Absolute: 0.3 10*3/uL (ref 0.1–0.9)
Monocytes: 1 %
NEUTROS ABS: 23.7 10*3/uL — AB (ref 1.4–7.0)
Neutrophils: 90 %
PLATELETS: 132 10*3/uL — AB (ref 150–379)
RBC: 3.27 x10E6/uL — ABNORMAL LOW (ref 4.14–5.80)
RDW: 17 % — ABNORMAL HIGH (ref 12.3–15.4)
WBC: 26.1 10*3/uL (ref 3.4–10.8)

## 2016-06-23 LAB — COMPREHENSIVE METABOLIC PANEL
ALBUMIN: 3.8 g/dL (ref 3.5–5.5)
ALBUMIN: 3.5 g/dL (ref 3.5–5.0)
ALK PHOS: 198 IU/L — AB (ref 39–117)
ALK PHOS: 158 U/L — AB (ref 38–126)
ALT: 12 IU/L (ref 0–44)
ALT: 15 U/L — ABNORMAL LOW (ref 17–63)
AST: 15 IU/L (ref 0–40)
AST: 23 U/L (ref 15–41)
Albumin/Globulin Ratio: 1.3 (ref 1.2–2.2)
Anion gap: 10 (ref 5–15)
BILIRUBIN TOTAL: 0.5 mg/dL (ref 0.0–1.2)
BILIRUBIN TOTAL: 1 mg/dL (ref 0.3–1.2)
BUN / CREAT RATIO: 16 (ref 9–20)
BUN: 27 mg/dL — AB (ref 6–24)
BUN: 34 mg/dL — AB (ref 6–20)
CALCIUM: 8.6 mg/dL — AB (ref 8.9–10.3)
CO2: 30 mmol/L — ABNORMAL HIGH (ref 18–29)
CO2: 29 mmol/L (ref 22–32)
CREATININE: 1.78 mg/dL — AB (ref 0.61–1.24)
Calcium: 9.1 mg/dL (ref 8.7–10.2)
Chloride: 98 mmol/L (ref 96–106)
Chloride: 99 mmol/L — ABNORMAL LOW (ref 101–111)
Creatinine, Ser: 1.67 mg/dL — ABNORMAL HIGH (ref 0.76–1.27)
GFR calc Af Amer: 52 mL/min/{1.73_m2} — ABNORMAL LOW (ref >59)
GFR calc Af Amer: 48 mL/min — ABNORMAL LOW (ref >60)
GFR calc non Af Amer: 45 mL/min/{1.73_m2} — ABNORMAL LOW (ref >59)
GFR calc non Af Amer: 41 mL/min — ABNORMAL LOW (ref >60)
Globulin, Total: 2.9 g/dL (ref 1.5–4.5)
Glucose, Bld: 102 mg/dL — ABNORMAL HIGH (ref 65–99)
Glucose: 172 mg/dL — ABNORMAL HIGH (ref 65–99)
POTASSIUM: 3.9 mmol/L (ref 3.5–5.1)
Potassium: 4.5 mmol/L (ref 3.5–5.2)
Sodium: 146 mmol/L — ABNORMAL HIGH (ref 134–144)
Sodium: 138 mmol/L (ref 135–145)
TOTAL PROTEIN: 6.9 g/dL (ref 6.5–8.1)
Total Protein: 6.7 g/dL (ref 6.0–8.5)

## 2016-06-23 LAB — CBC
HCT: 33.5 % — ABNORMAL LOW (ref 40.0–52.0)
Hemoglobin: 11.1 g/dL — ABNORMAL LOW (ref 13.0–18.0)
MCH: 35.7 pg — ABNORMAL HIGH (ref 26.0–34.0)
MCHC: 33.2 g/dL (ref 32.0–36.0)
MCV: 107.5 fL — ABNORMAL HIGH (ref 80.0–100.0)
PLATELETS: 78 10*3/uL — AB (ref 150–440)
RBC: 3.12 MIL/uL — ABNORMAL LOW (ref 4.40–5.90)
RDW: 17.5 % — ABNORMAL HIGH (ref 11.5–14.5)
WBC: 7 10*3/uL (ref 3.8–10.6)

## 2016-06-23 LAB — LIPID PANEL
CHOLESTEROL TOTAL: 139 mg/dL (ref 100–199)
Chol/HDL Ratio: 4.5 ratio units (ref 0.0–5.0)
HDL: 31 mg/dL — ABNORMAL LOW (ref >39)
LDL Calculated: 55 mg/dL (ref 0–99)
TRIGLYCERIDES: 265 mg/dL — AB (ref 0–149)
VLDL Cholesterol Cal: 53 mg/dL — ABNORMAL HIGH (ref 5–40)

## 2016-06-23 LAB — HEMOGLOBIN: HEMOGLOBIN: 10 g/dL — AB (ref 13.0–18.0)

## 2016-06-23 LAB — TYPE AND SCREEN
ABO/RH(D): B POS
ANTIBODY SCREEN: NEGATIVE

## 2016-06-23 LAB — TSH: TSH: 2.44 u[IU]/mL (ref 0.450–4.500)

## 2016-06-23 MED ORDER — ACETAMINOPHEN 325 MG PO TABS: 650.0000 mg | ORAL_TABLET | Freq: Four times a day (QID) | ORAL | Status: DC | PRN

## 2016-06-23 MED ORDER — ONDANSETRON HCL 4 MG PO TABS: 4.0000 mg | ORAL_TABLET | Freq: Four times a day (QID) | ORAL | Status: DC | PRN

## 2016-06-23 MED ORDER — FOLIC ACID 1 MG PO TABS
1.0000 mg | ORAL_TABLET | Freq: Every day | ORAL | Status: DC
  Administered 2016-06-24 – 2016-06-25 (×2): 1 mg via ORAL
  Filled 2016-06-23 (×2): qty 1

## 2016-06-23 MED ORDER — AMLODIPINE BESYLATE 5 MG PO TABS
5.0000 mg | ORAL_TABLET | Freq: Every day | ORAL | Status: DC
  Administered 2016-06-24 – 2016-06-25 (×2): 5 mg via ORAL
  Filled 2016-06-23 (×2): qty 1

## 2016-06-23 MED ORDER — IOPAMIDOL (ISOVUE-300) INJECTION 61%
100.0000 mL | Freq: Once | INTRAVENOUS | Status: AC | PRN
  Administered 2016-06-23: 100 mL via INTRAVENOUS

## 2016-06-23 MED ORDER — COLCHICINE 0.6 MG PO TABS
0.6000 mg | ORAL_TABLET | Freq: Every day | ORAL | Status: DC | PRN
  Filled 2016-06-23: qty 1

## 2016-06-23 MED ORDER — FLUOXETINE HCL 20 MG PO CAPS
40.0000 mg | ORAL_CAPSULE | ORAL | Status: DC
  Administered 2016-06-24 – 2016-06-25 (×2): 40 mg via ORAL
  Filled 2016-06-23 (×2): qty 2

## 2016-06-23 MED ORDER — OXYCODONE HCL 5 MG PO TABS: 5.0000 mg | ORAL_TABLET | ORAL | Status: DC | PRN

## 2016-06-23 MED ORDER — ALBUTEROL SULFATE (2.5 MG/3ML) 0.083% IN NEBU: 2.5000 mg | INHALATION_SOLUTION | RESPIRATORY_TRACT | Status: DC

## 2016-06-23 MED ORDER — IOPAMIDOL (ISOVUE-300) INJECTION 61%
30.0000 mL | Freq: Once | INTRAVENOUS | Status: AC | PRN
  Administered 2016-06-23: 30 mL via ORAL

## 2016-06-23 MED ORDER — ACETAMINOPHEN 650 MG RE SUPP: 650.0000 mg | Freq: Four times a day (QID) | RECTAL | Status: DC | PRN

## 2016-06-23 MED ORDER — SODIUM CHLORIDE 0.9 % IV SOLN
Freq: Once | INTRAVENOUS | Status: AC
  Administered 2016-06-23: 16:00:00 via INTRAVENOUS

## 2016-06-23 MED ORDER — ONDANSETRON HCL 4 MG/2ML IJ SOLN: 4.0000 mg | Freq: Four times a day (QID) | INTRAMUSCULAR | Status: DC | PRN

## 2016-06-23 MED ORDER — IRBESARTAN 150 MG PO TABS
150.0000 mg | ORAL_TABLET | Freq: Every day | ORAL | Status: DC
  Administered 2016-06-24 – 2016-06-25 (×2): 150 mg via ORAL
  Filled 2016-06-23 (×2): qty 1

## 2016-06-23 MED ORDER — CALCIUM CARBONATE ANTACID 500 MG PO CHEW
1250.0000 mg | CHEWABLE_TABLET | Freq: Two times a day (BID) | ORAL | Status: DC
  Administered 2016-06-24 – 2016-06-25 (×2): 1250 mg via ORAL
  Filled 2016-06-23 (×3): qty 3

## 2016-06-23 MED ORDER — ONDANSETRON HCL 4 MG/2ML IJ SOLN
4.0000 mg | Freq: Once | INTRAMUSCULAR | Status: AC
  Administered 2016-06-23: 4 mg via INTRAVENOUS
  Filled 2016-06-23: qty 2

## 2016-06-23 NOTE — H&P (Signed)
[name] NAME: Stuart Spence    MR#:  [id number]  DATE OF BIRTH:  December 18, 1960  DATE OF ADMISSION:  06/23/2016  PRIMARY CARE PHYSICIAN: Golden Pop, MD   REQUESTING/REFERRING PHYSICIAN: Dr. Lenise Arena  CHIEF COMPLAINT:   Chief Complaint  Patient presents with  . Rectal Bleeding    HISTORY OF PRESENT ILLNESS:  Stuart Spence  is a 56 y.o. male with a known history of Lung cancer, depression, hypertension, COPD who presents to the hospital due to rectal bleeding. Patient says he had a large rectal bleeding earlier today. He's never had rectal bleeding in the past. He denies ever having a colonoscopy done in the past. He denies being on any blood thinners. Patient had only 1 episode of a rectal bleeding, it was not associated with any abdominal pain, nausea vomiting or any other associated symptoms. His hemoglobin is currently stable. Hospitalist services were contacted for admission under observation.  PAST MEDICAL HISTORY:   Past Medical History:  Diagnosis Date  . Cancer ([place])    Lung  . Depression   . ED (erectile dysfunction)   . Gout   . Hypertension   . On home oxygen therapy   . Personal history of chemotherapy    PT TAKING CHEMO EVERY 3 WEEKS    PAST SURGICAL HISTORY:   Past Surgical History:  Procedure Laterality Date  . CHEST TUBE INSERTION Left 08/13/2015   Procedure: CHEST TUBE INSERTION;  Surgeon: Nestor Lewandowsky, MD;  Location: [facility] ORS;  Service: General;  Laterality: Left;  . CHEST TUBE INSERTION N/A 09/18/2015   Procedure: PLEURX CATH REMOVAL;  Surgeon: Nestor Lewandowsky, MD;  Location: [facility] ORS;  Service: Thoracic;  Laterality: N/A;  . PORTACATH PLACEMENT Left 08/13/2015   Procedure: INSERTION PORT-A-CATH;  Surgeon: Nestor Lewandowsky, MD;  Location: [facility] ORS;  Service: General;  Laterality: Left;  . PORTACATH PLACEMENT    . TONSILLECTOMY      SOCIAL HISTORY:   Social History  Substance Use Topics  . Smoking  status: Never Smoker  . Smokeless tobacco: Never Used  . Alcohol use No     Comment: gave it up in August 2016    FAMILY HISTORY:   Family History  Problem Relation Age of Onset  . Cancer Mother 90    lung  . Heart attack Father 72  . Hypertension Father   . Congestive Heart Failure Father 62    died from  . Diabetes Sister     DRUG ALLERGIES:  No Known Allergies  REVIEW OF SYSTEMS:   Review of Systems  Constitutional: Negative for chills, fever and weight loss.  HENT: Negative for congestion, nosebleeds and tinnitus.   Eyes: Negative for blurred vision, double vision and redness.  Respiratory: Negative for cough, hemoptysis, shortness of breath and wheezing.   Cardiovascular: Negative for chest pain, orthopnea, leg swelling and PND.  Gastrointestinal: Positive for blood in stool. Negative for abdominal pain, diarrhea, melena, nausea and vomiting.  Genitourinary: Negative for dysuria, hematuria and urgency.  Musculoskeletal: Negative for falls and joint pain.  Neurological: Negative for dizziness, tingling, sensory change, focal weakness, seizures, weakness and headaches.  Endo/Heme/Allergies: Negative for polydipsia. Does not bruise/bleed easily.  Psychiatric/Behavioral: Negative for depression and memory loss. The patient is not nervous/anxious.   All other systems reviewed and are negative.   MEDICATIONS AT HOME:   Prior to Admission medications   Medication Sig Start Date End Date Taking? Authorizing Provider  albuterol (  PROVENTIL HFA;VENTOLIN HFA) 108 (90 Base) MCG/ACT inhaler Inhale 1-2 puffs into the lungs See admin instructions. Reported on 09/12/2015   Yes Historical Provider, MD  amLODipine (NORVASC) 5 MG tablet Take 1 tablet (5 mg total) by mouth daily. 06/22/16  Yes Guadalupe Maple, MD  Calcium Carbonate (CALCIUM 600 PO) Take 1,200 mg by mouth 2 (two) times daily.   Yes Historical Provider, MD  colchicine 0.6 MG tablet Take 0.6 mg by mouth daily as needed (two  pills by mouth at onset, the one pill one hour later, maximum three pills per gout flare).   Yes Historical Provider, MD  Dextromethorphan Polistirex (DELSYM PO) Take by mouth.   Yes Historical Provider, MD  FLUoxetine (PROZAC) 20 MG capsule Take 2 capsules (40 mg total) by mouth every morning. 06/22/16  Yes Guadalupe Maple, MD  folic acid (FOLVITE) 1 MG tablet Take 1 tablet (1 mg total) by mouth daily. 02/12/16  Yes Lequita Asal, MD  lidocaine-prilocaine (EMLA) cream Apply 1 application topically as needed. 08/21/15  Yes Lequita Asal, MD  nystatin (NYSTATIN) powder Apply topically 3 (three) times daily as needed. 05/28/16  Yes Lequita Asal, MD  ondansetron (ZOFRAN) 8 MG tablet Take 1 tablet (8 mg total) by mouth every 8 (eight) hours as needed for nausea or vomiting. 08/21/15  Yes Lequita Asal, MD  oxyCODONE (OXY IR/ROXICODONE) 5 MG immediate release tablet 1 tablet every 4-6 hours as need for pain 06/18/16  Yes Sindy Guadeloupe, MD  sildenafil (VIAGRA) 100 MG tablet Take 1 tablet (100 mg total) by mouth daily as needed for erectile dysfunction. 12/24/15  Yes Guadalupe Maple, MD  telmisartan (MICARDIS) 40 MG tablet Take 1 tablet (40 mg total) by mouth daily. 06/22/16  Yes Guadalupe Maple, MD  dexamethasone (DECADRON) 4 MG tablet TAKE ONE TABLET BY MOUTH TWICE DAILY THE DAY BEFORE CHEMO, AND THE DAY AFTER CHEMO WITH EACH REGIMEN OF CARBO/ALIMTA 04/15/16   Lequita Asal, MD  OXYGEN Inhale 2 L into the lungs continuous. Reported on 09/12/2015    Historical Provider, MD  predniSONE (DELTASONE) 10 MG tablet Taking 5 a day for 5 days Patient not taking: Reported on 06/23/2016 06/22/16   Guadalupe Maple, MD      VITAL SIGNS:  Blood pressure 138/76, pulse 94, temperature 98.5 F (36.9 C), temperature source Oral, resp. rate 14, height '5\' 6"'$  (1.676 m), weight 106.6 kg (235 lb), SpO2 97 %.  PHYSICAL EXAMINATION:  Physical Exam  GENERAL:  56 y.o.-year-old patient lying in bed with in acute  distress.  EYES: Pupils equal, round, reactive to light and accommodation. No scleral icterus. Extraocular muscles intact.  HEENT: Head atraumatic, normocephalic. Oropharynx and nasopharynx clear. No oropharyngeal erythema, moist oral mucosa  NECK:  Supple, no jugular venous distention. No thyroid enlargement, no tenderness.  LUNGS: Normal breath sounds bilaterally, no wheezing, rales, rhonchi. No use of accessory muscles of respiration.  CARDIOVASCULAR: S1, S2 RRR. No murmurs, rubs, gallops, clicks.  ABDOMEN: Soft, nontender, nondistended. Bowel sounds present. No organomegaly or mass.  EXTREMITIES: No pedal edema, cyanosis, or clubbing. + 2 pedal & radial pulses b/l.   NEUROLOGIC: Cranial nerves II through XII are intact. No focal Motor or sensory deficits appreciated b/l PSYCHIATRIC: The patient is alert and oriented x 3. SKIN: No obvious rash, lesion, or ulcer.   LABORATORY PANEL:   CBC  Recent Labs Lab 06/23/16 1222  WBC 7.0  HGB 11.1*  HCT 33.5*  PLT 78*   ------------------------------------------------------------------------------------------------------------------  Chemistries   Recent Labs Lab 06/17/16 0920  06/23/16 1222  NA 141  < > 138  K 3.6  < > 3.9  CL 102  < > 99*  CO2 32  < > 29  GLUCOSE 168*  < > 102*  BUN 14  < > 34*  CREATININE 1.65*  < > 1.78*  CALCIUM 8.6*  < > 8.6*  MG 2.1  --   --   AST 31  < > 23  ALT 28  < > 15*  ALKPHOS 135*  < > 158*  BILITOT 0.5  < > 1.0  < > = values in this interval not displayed. ------------------------------------------------------------------------------------------------------------------  Cardiac Enzymes No results for input(s): TROPONINI in the last 168 hours. ------------------------------------------------------------------------------------------------------------------  RADIOLOGY:  Dg Chest 2 View  Result Date: 06/23/2016 CLINICAL DATA:  Red bloody stools since last night, on chemotherapy for lung  cancer, LEFT chest rash diagnosed as shingles, history hypertension, gout EXAM: CHEST  2 VIEW COMPARISON:  04/10/2016 FINDINGS: RIGHT jugular Port-A-Cath with tip projecting over SVC. Normal heart size, mediastinal contours, and pulmonary vascularity. Volume loss in RIGHT hemithorax with scattered atelectasis. Probable RIGHT pleural effusion. LEFT lung clear. No pneumothorax. Sclerotic anterior and lateral RIGHT fourth rib. IMPRESSION: Increased atelectasis and pleural effusion in the RIGHT hemithorax with overall progressive volume loss. Sclerotic metastasis RIGHT fourth rib. Electronically Signed   By: Lavonia Dana M.D.   On: 06/23/2016 15:31   Ct Abdomen Pelvis W Contrast  Result Date: 06/23/2016 CLINICAL DATA:  56 year old male undergoing chemotherapy treatment for metastatic right lung adenocarcinoma. Two bright red bloody stools since last night. EXAM: CT ABDOMEN AND PELVIS WITH CONTRAST TECHNIQUE: Multidetector CT imaging of the abdomen and pelvis was performed using the standard protocol following bolus administration of intravenous contrast. CONTRAST:  137m ISOVUE-300 IOPAMIDOL (ISOVUE-300) INJECTION 61% COMPARISON:  PET-CT 05/05/2016 and earlier. FINDINGS: Lower chest: Small right pleural effusion is stable since January. Streaky right lung base peribronchial opacity appears increased, peptic early in the posterior basal segment of the right lower lobe. The left lung base appears stable. No pericardial or left pleural effusion. Hepatobiliary: No definite metastatic disease to the liver. Small gallstone re- demonstrated. No pericholecystic inflammation. No biliary ductal enlargement. Pancreas: Negative. Spleen: Stable borderline to mild splenomegaly, no discrete splenic lesion. Adrenals/Urinary Tract: Negative adrenal glands. Bilateral renal enhancement and contrast excretion is within normal limits. Unremarkable urinary bladder. No abdominal free fluid. Stomach/Bowel: No rectosigmoid colon wall  thickening or surrounding inflammation. There is a small fluid level mixed with stool in the rectum. Likewise the left colon is within normal limits. Occasional fluid levels in the transverse colon. Oral contrast has reached the cecum. No right colon inflammation. Negative appendix. Negative terminal ileum. No dilated or abnormal small bowel loops. Negative stomach and duodenum. Vascular/Lymphatic: Major arterial structures in the abdomen and pelvis appear patent and normal. Portal venous system is patent. No lymphadenopathy. Reproductive: Negative. Other: No pelvic free fluid. Musculoskeletal: Numerous predominantly sclerotic vertebral metastases appear stable since January. Superimposed lower lumbar severe facet arthropathy. Confluent sclerosis of the left acetabulum re- demonstrated. Confluent sclerotic lesion in the proximal right femoral shaft. No pathologic fracture identified. IMPRESSION: 1. No inflamed or abnormal bowel identified. No acute or inflammatory process identified in the abdomen or pelvis. 2. Widespread osseous metastatic disease appears stable since the January PET-CT. No other metastatic disease identified in the abdomen or pelvis. 3. Continued small pleural effusion at the right lung base with nonspecific increased right lower lobe  peribronchial opacity since the prior PET-CT. Differential considerations include lymphangitic carcinomatosis, atelectasis, and infection. 4. Chronic Cholelithiasis. Electronically Signed   By: Genevie Ann M.D.   On: 06/23/2016 15:20     IMPRESSION AND PLAN:   56 year old male with past medical history of hypertension, GERD, COPD, lung cancer currently getting treatment to presented to the hospital due to rectal bleeding.  1. Rectal bleeding-no previous history of GI bleed. Patient has not even had a screening colonoscopy since age 47. -We'll observe overnight, cycle hemoglobin. Transfuse if hemoglobin drops less than 7. Clinically asymptomatic  presently. -Place on clear liquid diet, get a gastroenterology consult.  2. History of hypertension-continue amlodipine, Avapro  3. Gout-no acute attack-continue cold seen.  4. Depression-continue Prozac.  5. Lung cancer currently getting ongoing chemoradiation-follows with Dr. Mike Gip. - no acute issue presently.   All the records are reviewed and case discussed with ED provider. Management plans discussed with the patient, family and they are in agreement.  CODE STATUS: Full code  TOTAL TIME TAKING CARE OF THIS PATIENT: 45 minutes.    Henreitta Leber M.D on 06/23/2016 at 4:35 PM  Between 7am to 6pm - Pager - [phone]  After 6pm go to [email or web address] - password [email or web address]  Office  [phone]  CC: Primary care physician; Golden Pop, MD

## 2016-06-23 NOTE — ED Provider Notes (Addendum)
[facility] Emergency Department Provider Note        Time seen: ----------------------------------------- 12:53 PM on 06/23/2016 -----------------------------------------    I have reviewed the triage vital signs and the nursing notes.   HISTORY  Chief Complaint Rectal Bleeding    HPI Stuart Spence is a 56 y.o. male who presents to the ER for bloody stools since last night. Patient describes bright red blood and she's passing rectally. He states he has had this in the past but was never evaluated for her. Currently is on chemotherapy for lung cancer. Patient denies fevers, chills, or chest pain. Patient has had intermittent constipation due to his medications. He denies any abdominal pain or rectal pain.   Past Medical History:  Diagnosis Date  . Cancer ([place])    Lung  . Depression   . ED (erectile dysfunction)   . Gout   . Hypertension   . On home oxygen therapy   . Personal history of chemotherapy    PT TAKING CHEMO EVERY 3 WEEKS    Patient Active Problem List   Diagnosis Date Noted  . Shingles 06/22/2016  . Encounter for antineoplastic chemotherapy 05/08/2016  . Encounter for antineoplastic immunotherapy 05/08/2016  . Hypocalcemia 09/12/2015  . Hyperglycemia, unspecified 09/12/2015  . Bone metastasis ([place]) 08/20/2015  . Adenocarcinoma, lung ([place]) 08/19/2015  . Pleural effusion, right   . Respiratory failure with hypoxia ([place]) 08/10/2015  . Essential hypertension 06/23/2015  . Depression 06/23/2015    Past Surgical History:  Procedure Laterality Date  . CHEST TUBE INSERTION Left 08/13/2015   Procedure: CHEST TUBE INSERTION;  Surgeon: Nestor Lewandowsky, MD;  Location: [facility] ORS;  Service: General;  Laterality: Left;  . CHEST TUBE INSERTION N/A 09/18/2015   Procedure: PLEURX CATH REMOVAL;  Surgeon: Nestor Lewandowsky, MD;  Location: [facility] ORS;  Service: Thoracic;  Laterality: N/A;  . PORTACATH PLACEMENT Left 08/13/2015   Procedure: INSERTION  PORT-A-CATH;  Surgeon: Nestor Lewandowsky, MD;  Location: [facility] ORS;  Service: General;  Laterality: Left;  . PORTACATH PLACEMENT    . TONSILLECTOMY      Allergies Patient has no known allergies.  Social History Social History  Substance Use Topics  . Smoking status: Never Smoker  . Smokeless tobacco: Never Used  . Alcohol use No     Comment: gave it up in August 2016    Review of Systems Constitutional: Negative for fever. Cardiovascular: Negative for chest pain. Respiratory: Negative for shortness of breath. Gastrointestinal: Negative for abdominal pain, vomiting and diarrhea. Positive for rectal bleeding Musculoskeletal: Negative for back pain. Skin: Positive for rash on left side his abdominal wall Neurological: Negative for headaches, focal weakness or numbness.  10-point ROS otherwise negative.  ____________________________________________   PHYSICAL EXAM:  VITAL SIGNS: ED Triage Vitals  Enc Vitals Group     BP 06/23/16 1150 111/71     Pulse Rate 06/23/16 1150 (!) 111     Resp 06/23/16 1150 18     Temp 06/23/16 1150 98.5 F (36.9 C)     Temp Source 06/23/16 1150 Oral     SpO2 06/23/16 1150 95 %     Weight 06/23/16 1151 235 lb (106.6 kg)     Height 06/23/16 1151 '5\' 6"'$  (1.676 m)     Head Circumference --      Peak Flow --      Pain Score 06/23/16 1221 0     Pain Loc --      Pain Edu? --  Excl. in [place]? --     Constitutional: Alert and oriented. Well appearing and in no distress. Eyes: Conjunctivae are normal. PERRL. Normal extraocular movements. ENT   Head: Normocephalic and atraumatic.   Nose: No congestion/rhinnorhea.   Mouth/Throat: Mucous membranes are Dry   Neck: No stridor. Cardiovascular: Normal rate, regular rhythm. No murmurs, rubs, or gallops. Respiratory: Normal respiratory effort without tachypnea nor retractions. Breath sounds are clear and equal bilaterally. No wheezes/rales/rhonchi. Gastrointestinal: Soft and nontender. Normal  bowel sounds Rectal: Heme positive stool, no gross blood, no hemorrhoids, no impaction Musculoskeletal: Nontender with normal range of motion in all extremities. No lower extremity tenderness nor edema. Neurologic:  Normal speech and language. No gross focal neurologic deficits are appreciated.  Skin:  Skin is warm, dry and intact. No rash noted. Psychiatric: Mood and affect are normal. Speech and behavior are normal.  ____________________________________________  ED COURSE:  Pertinent labs & imaging results that were available during my care of the patient were reviewed by me and considered in my medical decision making (see chart for details). Patient presents to ER for rectal bleeding of uncertain etiology. He reportedly has stage IV lung cancer, is not taking anticoagulants. We will assess with labs and consider imaging.   Procedures   EKG: Interpreted by me, NSR with PAC, normal PR interval, normal QRS, normal QT ____________________________________________   LABS (pertinent positives/negatives)  Labs Reviewed  COMPREHENSIVE METABOLIC PANEL - Abnormal; Notable for the following:       Result Value   Chloride 99 (*)    Glucose, Bld 102 (*)    BUN 34 (*)    Creatinine, Ser 1.78 (*)    Calcium 8.6 (*)    ALT 15 (*)    Alkaline Phosphatase 158 (*)    GFR calc non Af Amer 41 (*)    GFR calc Af Amer 48 (*)    All other components within normal limits  CBC - Abnormal; Notable for the following:    RBC 3.12 (*)    Hemoglobin 11.1 (*)    HCT 33.5 (*)    MCV 107.5 (*)    MCH 35.7 (*)    RDW 17.5 (*)    Platelets 78 (*)    All other components within normal limits  POC OCCULT BLOOD, ED  TYPE AND SCREEN    RADIOLOGY Images were viewed by me IMPRESSION: Increased atelectasis and pleural effusion in the RIGHT hemithorax with overall progressive volume loss.  Sclerotic metastasis RIGHT fourth rib. CT abd/pelvis IMPRESSION: 1. No inflamed or abnormal bowel identified. No  acute or inflammatory process identified in the abdomen or pelvis. 2. Widespread osseous metastatic disease appears stable since the January PET-CT. No other metastatic disease identified in the abdomen or pelvis. 3. Continued small pleural effusion at the right lung base with nonspecific increased right lower lobe peribronchial opacity since the prior PET-CT. Differential considerations include lymphangitic carcinomatosis, atelectasis, and infection. 4. Chronic Cholelithiasis. ____________________________________________  FINAL ASSESSMENT AND PLAN  Rectal bleeding, metastatic lung cancer, mild renal insufficiency, right pleural effusion  Plan: Patient with labs and imaging as dictated above. Patient presents with GI bleeding which is new in onset. Labs are grossly within normal limits although mild worsening of his renal function. He will receive IV fluids. I will discuss with the hospitalist for observation.   Earleen Newport, MD   Note: This note was generated in part or whole with voice recognition software. Voice recognition is usually quite accurate but there are transcription errors  that can and very often do occur. I apologize for any typographical errors that were not detected and corrected.     Earleen Newport, MD 06/23/16 [place], MD 06/23/16 [phone]

## 2016-06-23 NOTE — Plan of Care (Signed)
Problem: Education: Goal: Knowledge of [facility] Education information/materials will improve Outcome: Progressing Pt likes to be called Stuart Spence  Past Medical History:  Diagnosis Date  . Cancer ([place])    Lung  . Depression   . ED (erectile dysfunction)   . Gout   . Hypertension   . On home oxygen therapy   . Personal history of chemotherapy    PT TAKING CHEMO EVERY 3 WEEKS   Pt is well controlled with home medications

## 2016-06-23 NOTE — Telephone Encounter (Signed)
Called patient back after wife called and reported GI bleeding, patient reports bleeding with bowel movement "right smart amount of blood" Patient instructed to go to the ED to be evaluated, voiced understanding and compliance.

## 2016-06-23 NOTE — ED Notes (Signed)
Patient given meal tray and ambulated well to the restroom.

## 2016-06-23 NOTE — ED Notes (Signed)
Admitting MD at bedside.

## 2016-06-23 NOTE — ED Triage Notes (Signed)
Pt sent by PCP for eval of red bloody stools since last night x2. Pt is currently under going chemo for Lung CA.Marland Kitchen Pt denies any pain..  Pt also has itchy rash that on the left side of chest and wraps around to the left side of back for the past 3 days, was seen by PCP yesterday and dx with shingles and rx prednisone.Marland Kitchen

## 2016-06-24 LAB — CBC
HCT: 30.2 % — ABNORMAL LOW (ref 40.0–52.0)
HEMOGLOBIN: 10.1 g/dL — AB (ref 13.0–18.0)
MCH: 35.9 pg — ABNORMAL HIGH (ref 26.0–34.0)
MCHC: 33.4 g/dL (ref 32.0–36.0)
MCV: 107.4 fL — ABNORMAL HIGH (ref 80.0–100.0)
Platelets: 53 10*3/uL — ABNORMAL LOW (ref 150–440)
RBC: 2.81 MIL/uL — AB (ref 4.40–5.90)
RDW: 17.2 % — ABNORMAL HIGH (ref 11.5–14.5)
WBC: 3.4 10*3/uL — AB (ref 3.8–10.6)

## 2016-06-24 LAB — BASIC METABOLIC PANEL
ANION GAP: 7 (ref 5–15)
BUN: 31 mg/dL — ABNORMAL HIGH (ref 6–20)
CO2: 30 mmol/L (ref 22–32)
Calcium: 7.9 mg/dL — ABNORMAL LOW (ref 8.9–10.3)
Chloride: 100 mmol/L — ABNORMAL LOW (ref 101–111)
Creatinine, Ser: 1.63 mg/dL — ABNORMAL HIGH (ref 0.61–1.24)
GFR, EST AFRICAN AMERICAN: 53 mL/min — AB (ref >60)
GFR, EST NON AFRICAN AMERICAN: 46 mL/min — AB (ref >60)
Glucose, Bld: 114 mg/dL — ABNORMAL HIGH (ref 65–99)
Potassium: 3.9 mmol/L (ref 3.5–5.1)
SODIUM: 137 mmol/L (ref 135–145)

## 2016-06-24 MED ORDER — PREDNISONE 50 MG PO TABS
50.0000 mg | ORAL_TABLET | Freq: Every day | ORAL | Status: DC
  Administered 2016-06-24 – 2016-06-25 (×2): 50 mg via ORAL
  Filled 2016-06-24 (×2): qty 1

## 2016-06-24 NOTE — Progress Notes (Signed)
[facility] NAME: Stuart Spence    MR#:  [id number]  DATE OF BIRTH:  29-Jul-1960  SUBJECTIVE:  Came in after having bloody stools. Patient had one episode last Tuesday and yesterday had one episode about cup full of bright red blood denies any history of hemorrhoids. Does not take any blood thinners. Denies history of constipation  REVIEW OF SYSTEMS:   Review of Systems  Constitutional: Negative for chills, fever and weight loss.  HENT: Negative for ear discharge, ear pain and nosebleeds.   Eyes: Negative for blurred vision, pain and discharge.  Respiratory: Negative for sputum production, shortness of breath, wheezing and stridor.   Cardiovascular: Negative for chest pain, palpitations, orthopnea and PND.  Gastrointestinal: Positive for blood in stool. Negative for abdominal pain, diarrhea, nausea and vomiting.  Genitourinary: Negative for frequency and urgency.  Musculoskeletal: Negative for back pain and joint pain.  Neurological: Negative for sensory change, speech change, focal weakness and weakness.  Psychiatric/Behavioral: Negative for depression and hallucinations. The patient is not nervous/anxious.    Tolerating Diet:yes Tolerating PT: not needed  DRUG ALLERGIES:  No Known Allergies  VITALS:  Blood pressure (!) 115/54, pulse 97, temperature 98.7 F (37.1 C), temperature source Oral, resp. rate 18, height '5\' 6"'$  (1.676 m), weight 106.6 kg (235 lb), SpO2 97 %.  PHYSICAL EXAMINATION:   Physical Exam  GENERAL:  56 y.o.-year-old patient lying in the bed with no acute distress. obese EYES: Pupils equal, round, reactive to light and accommodation. No scleral icterus. Extraocular muscles intact.  HEENT: Head atraumatic, normocephalic. Oropharynx and nasopharynx clear.  NECK:  Supple, no jugular venous distention. No thyroid enlargement, no tenderness.  LUNGS: Normal breath sounds bilaterally, no wheezing, rales,  rhonchi. No use of accessory muscles of respiration.  CARDIOVASCULAR: S1, S2 normal. No murmurs, rubs, or gallops.  ABDOMEN: Soft, nontender, nondistended. Bowel sounds present. No organomegaly or mass.  EXTREMITIES: No cyanosis, clubbing or edema b/l.    NEUROLOGIC: Cranial nerves II through XII are intact. No focal Motor or sensory deficits b/l.   PSYCHIATRIC:  patient is alert and oriented x 3.  SKIN: No obvious lesion, or ulcer. Red maculopapular rash around the left abdomen and flank appears old  LABORATORY PANEL:  CBC  Recent Labs Lab 06/24/16 0317  WBC 3.4*  HGB 10.1*  HCT 30.2*  PLT 53*    Chemistries   Recent Labs Lab 06/23/16 1222 06/24/16 0317  NA 138 137  K 3.9 3.9  CL 99* 100*  CO2 29 30  GLUCOSE 102* 114*  BUN 34* 31*  CREATININE 1.78* 1.63*  CALCIUM 8.6* 7.9*  AST 23  --   ALT 15*  --   ALKPHOS 158*  --   BILITOT 1.0  --    Cardiac Enzymes No results for input(s): TROPONINI in the last 168 hours. RADIOLOGY:  Dg Chest 2 View  Result Date: 06/23/2016 CLINICAL DATA:  Red bloody stools since last night, on chemotherapy for lung cancer, LEFT chest rash diagnosed as shingles, history hypertension, gout EXAM: CHEST  2 VIEW COMPARISON:  04/10/2016 FINDINGS: RIGHT jugular Port-A-Cath with tip projecting over SVC. Normal heart size, mediastinal contours, and pulmonary vascularity. Volume loss in RIGHT hemithorax with scattered atelectasis. Probable RIGHT pleural effusion. LEFT lung clear. No pneumothorax. Sclerotic anterior and lateral RIGHT fourth rib. IMPRESSION: Increased atelectasis and pleural effusion in the RIGHT hemithorax with overall progressive volume loss. Sclerotic metastasis RIGHT fourth rib. Electronically Signed  By: Lavonia Dana M.D.   On: 06/23/2016 15:31   Ct Abdomen Pelvis W Contrast  Result Date: 06/23/2016 CLINICAL DATA:  56 year old male undergoing chemotherapy treatment for metastatic right lung adenocarcinoma. Two bright red bloody stools  since last night. EXAM: CT ABDOMEN AND PELVIS WITH CONTRAST TECHNIQUE: Multidetector CT imaging of the abdomen and pelvis was performed using the standard protocol following bolus administration of intravenous contrast. CONTRAST:  137m ISOVUE-300 IOPAMIDOL (ISOVUE-300) INJECTION 61% COMPARISON:  PET-CT 05/05/2016 and earlier. FINDINGS: Lower chest: Small right pleural effusion is stable since January. Streaky right lung base peribronchial opacity appears increased, peptic early in the posterior basal segment of the right lower lobe. The left lung base appears stable. No pericardial or left pleural effusion. Hepatobiliary: No definite metastatic disease to the liver. Small gallstone re- demonstrated. No pericholecystic inflammation. No biliary ductal enlargement. Pancreas: Negative. Spleen: Stable borderline to mild splenomegaly, no discrete splenic lesion. Adrenals/Urinary Tract: Negative adrenal glands. Bilateral renal enhancement and contrast excretion is within normal limits. Unremarkable urinary bladder. No abdominal free fluid. Stomach/Bowel: No rectosigmoid colon wall thickening or surrounding inflammation. There is a small fluid level mixed with stool in the rectum. Likewise the left colon is within normal limits. Occasional fluid levels in the transverse colon. Oral contrast has reached the cecum. No right colon inflammation. Negative appendix. Negative terminal ileum. No dilated or abnormal small bowel loops. Negative stomach and duodenum. Vascular/Lymphatic: Major arterial structures in the abdomen and pelvis appear patent and normal. Portal venous system is patent. No lymphadenopathy. Reproductive: Negative. Other: No pelvic free fluid. Musculoskeletal: Numerous predominantly sclerotic vertebral metastases appear stable since January. Superimposed lower lumbar severe facet arthropathy. Confluent sclerosis of the left acetabulum re- demonstrated. Confluent sclerotic lesion in the proximal right femoral  shaft. No pathologic fracture identified. IMPRESSION: 1. No inflamed or abnormal bowel identified. No acute or inflammatory process identified in the abdomen or pelvis. 2. Widespread osseous metastatic disease appears stable since the January PET-CT. No other metastatic disease identified in the abdomen or pelvis. 3. Continued small pleural effusion at the right lung base with nonspecific increased right lower lobe peribronchial opacity since the prior PET-CT. Differential considerations include lymphangitic carcinomatosis, atelectasis, and infection. 4. Chronic Cholelithiasis. Electronically Signed   By: HGenevie AnnM.D.   On: 06/23/2016 15:20   ASSESSMENT AND PLAN:   56year old male with past medical history of hypertension, GERD, COPD, lung cancer currently getting treatment to presented to the hospital due to rectal bleeding.  1. Rectal bleeding-no previous history of GI bleed. Patient has not even had a screening colonoscopy since age 56 -hgb 10.6--10.1 -Transfuse if hemoglobin drops less than 7. Clinically asymptomatic presently. -on clear liquid diet, get a gastroenterology consult. Spoke with dr eTiffany Kocher May consider doing colonoscopy tomorrow  2. History of hypertension-continue amlodipine, Avapro  3. Gout-no acute attack-continue cold seen.  4. Depression-continue Prozac.  5. Lung cancer currently getting ongoing chemoradiation-follows with Dr. CMike Gip - no acute issue presently.   6. Skin rash around the left flank back ?shingles Patient is more than 5 days onset of rash. No indication for antiviral. We will give prednisone for postherpetic neuralgia. He was prescribed by his PCP prednisone but has not picked up the prescription hence will start 50 mg daily taper it over 5 days  Case discussed with Care Management/Social Worker. Management plans discussed with the patient, family and they are in agreement.  CODE STATUS: full  DVT Prophylaxis: ambulatory  TOTAL TIME TAKING  CARE OF THIS  PATIENT:30* minutes.  >50% time spent on counselling and coordination of care  POSSIBLE D/C IN *1 DAYS, DEPENDING ON CLINICAL CONDITION.  Note: This dictation was prepared with Dragon dictation along with smaller phrase technology. Any transcriptional errors that result from this process are unintentional.  Lorain Keast M.D on 06/24/2016 at 2:49 PM  Between 7am to 6pm - Pager - [phone]  After 6pm go to [email or web address] - password [email or web address]  Office  [phone]  CC: Primary care physician; Golden Pop, MD

## 2016-06-24 NOTE — Consult Note (Signed)
Consultation  Referring Provider: Dr. Verdell Carmine  Primary Care Physician:  Golden Pop, MD Consulting  Gastroenterologist: Dr. Gaylyn Cheers Reason for Consultation: Rectal bleeding          HPI:   Stuart Spence is a 56 y.o. male with a known history of Lung cancer, depression, hypertension, COPD who presents to the hospital due to rectal bleeding with adm  Hgb 11.1 and today 10.1.  He reports first episode of bright red blood per rectum occurred after taking a bath on Tuesday.  He saw bright red blood in the commode and on the toilet tissue.  He had no straining, rectal complaints, and bowel movement was normal. No rectal itching burning or pain.  A second event of bleeding started yesterday.  He passed a normal formed stool once again and had bright red blood in the commode- large amount.  He had no rectal complaints, or abdominal pain or cramps. He feels fine.  He says he has never had rectal bleeding before and typically does not have any problems with constipation straining or hemorrhoids.  He is never had a colonoscopy.  He reports normal diet and appetite and no upper GI complaints.  No NSAID use. He ate solid food last night and has been on clear liquids today.   He is currently undergoing a second round chemotherapy for metastatic lung cancer and tolerating that well. Chart review : Hgb 10.9 to 11.3 and two months ago. Last year, Hgb 14.1 to 12.8 on CTX.  BUN 27-34 on adm. BUN 22-15 and 40 a month ago.   He was Dx with left chest shingles on Mon and is in airborne  isolation. His PCP ordered prednisone- which he has not yet filled. He is not on an anti-viral.  His wife and daughter at bedside and he is in no distress.    Past Medical History:  Diagnosis Date  . Cancer ([place])    Lung  . Depression   . ED (erectile dysfunction)   . Gout   . Hypertension   . On home oxygen therapy   . Personal history of chemotherapy    PT TAKING CHEMO EVERY 3 WEEKS    Past Surgical History:   Procedure Laterality Date  . CHEST TUBE INSERTION Left 08/13/2015   Procedure: CHEST TUBE INSERTION;  Surgeon: Nestor Lewandowsky, MD;  Location: [facility] ORS;  Service: General;  Laterality: Left;  . CHEST TUBE INSERTION N/A 09/18/2015   Procedure: PLEURX CATH REMOVAL;  Surgeon: Nestor Lewandowsky, MD;  Location: [facility] ORS;  Service: Thoracic;  Laterality: N/A;  . PORTACATH PLACEMENT Left 08/13/2015   Procedure: INSERTION PORT-A-CATH;  Surgeon: Nestor Lewandowsky, MD;  Location: [facility] ORS;  Service: General;  Laterality: Left;  . PORTACATH PLACEMENT    . TONSILLECTOMY      Family History  Problem Relation Age of Onset  . Cancer Mother 6    lung  . Heart attack Father 34  . Hypertension Father   . Congestive Heart Failure Father 32    died from  . Diabetes Sister      Social History  Substance Use Topics  . Smoking status: Never Smoker  . Smokeless tobacco: Never Used  . Alcohol use No     Comment: gave it up in August 2016    Prior to Admission medications   Medication Sig Start Date End Date Taking? Authorizing Provider  albuterol (PROVENTIL HFA;VENTOLIN HFA) 108 (90 Base) MCG/ACT inhaler Inhale 1-2 puffs into the lungs See admin instructions. Reported  on 09/12/2015   Yes Historical Provider, MD  amLODipine (NORVASC) 5 MG tablet Take 1 tablet (5 mg total) by mouth daily. 06/22/16  Yes Guadalupe Maple, MD  Calcium Carbonate (CALCIUM 600 PO) Take 1,200 mg by mouth 2 (two) times daily.   Yes Historical Provider, MD  colchicine 0.6 MG tablet Take 0.6 mg by mouth daily as needed (two pills by mouth at onset, the one pill one hour later, maximum three pills per gout flare).   Yes Historical Provider, MD  Dextromethorphan Polistirex (DELSYM PO) Take by mouth.   Yes Historical Provider, MD  FLUoxetine (PROZAC) 20 MG capsule Take 2 capsules (40 mg total) by mouth every morning. 06/22/16  Yes Guadalupe Maple, MD  folic acid (FOLVITE) 1 MG tablet Take 1 tablet (1 mg total) by mouth daily. 02/12/16  Yes Lequita Asal, MD  lidocaine-prilocaine (EMLA) cream Apply 1 application topically as needed. 08/21/15  Yes Lequita Asal, MD  nystatin ) powder Apply topically 3 (three) times daily as needed. 05/28/16  Yes Lequita Asal, MD  ondansetron (ZOFRAN) 8 MG tablet Take 1 tablet (8 mg total) by mouth every 8 (eight) hours as needed for nausea or vomiting. 08/21/15  Yes Lequita Asal, MD  oxyCODONE (OXY IR/ROXICODONE) 5 MG immediate release tablet 1 tablet every 4-6 hours as need for pain 06/18/16  Yes Sindy Guadeloupe, MD  sildenafil (VIAGRA) 100 MG tablet Take 1 tablet (100 mg total) by mouth daily as needed for erectile dysfunction. 12/24/15  Yes Guadalupe Maple, MD  telmisartan (MICARDIS) 40 MG tablet Take 1 tablet (40 mg total) by mouth daily. 06/22/16  Yes Guadalupe Maple, MD  dexamethasone (DECADRON) 4 MG tablet TAKE ONE TABLET BY MOUTH TWICE DAILY THE DAY BEFORE CHEMO, AND THE DAY AFTER CHEMO WITH EACH REGIMEN OF CARBO/ALIMTA 04/15/16   Lequita Asal, MD  OXYGEN Inhale 2 L into the lungs continuous. Reported on 09/12/2015    Historical Provider, MD  predniSONE (DELTASONE) 10 MG tablet Taking 5 a day for 5 days Patient not taking: Reported on 06/23/2016 06/22/16   Guadalupe Maple, MD    Current Facility-Administered Medications  Medication Dose Route Frequency Provider Last Rate Last Dose  . acetaminophen (TYLENOL) tablet 650 mg  650 mg Oral Q6H PRN Henreitta Leber, MD       Or  . acetaminophen (TYLENOL) suppository 650 mg  650 mg Rectal Q6H PRN Henreitta Leber, MD      . albuterol (PROVENTIL) (2.5 MG/3ML) 0.083% nebulizer solution 2.5 mg  2.5 mg Inhalation See admin instructions Henreitta Leber, MD      . amLODipine (NORVASC) tablet 5 mg  5 mg Oral Daily Henreitta Leber, MD   5 mg at 06/24/16 0912  . calcium carbonate (TUMS - dosed in mg elemental calcium) chewable tablet 1,250 mg  1,250 mg Oral BID Henreitta Leber, MD   1,250 mg at 06/24/16 0912  . colchicine tablet 0.6 mg  0.6 mg Oral Daily  PRN Henreitta Leber, MD      . FLUoxetine (PROZAC) capsule 40 mg  40 mg Oral BH-q7a Henreitta Leber, MD   40 mg at 06/24/16 0912  . folic acid (FOLVITE) tablet 1 mg  1 mg Oral Daily Henreitta Leber, MD   1 mg at 06/24/16 0912  . irbesartan (AVAPRO) tablet 150 mg  150 mg Oral Daily Henreitta Leber, MD   150 mg at 06/24/16 0912  .  ondansetron (ZOFRAN) tablet 4 mg  4 mg Oral Q6H PRN Henreitta Leber, MD       Or  . ondansetron (ZOFRAN) injection 4 mg  4 mg Intravenous Q6H PRN Henreitta Leber, MD      . oxyCODONE (Oxy IR/ROXICODONE) immediate release tablet 5 mg  5 mg Oral Q4H PRN Henreitta Leber, MD      . predniSONE (DELTASONE) tablet 50 mg  50 mg Oral Q breakfast Fritzi Mandes, MD        Allergies as of 06/23/2016  . (No Known Allergies)     Review of Systems:    A 12 system review was obtained and all negative except where noted in HPI.    Physical Exam:  Vital signs in last 24 hours: Temp:  [98.6 F (37 C)-100 F (37.8 C)] 98.7 F (37.1 C) (03/22 1216) Pulse Rate:  [87-108] 97 (03/22 1216) Resp:  [14-27] 18 (03/22 1216) BP: (115-153)/(54-91) 115/54 (03/22 1216) SpO2:  [92 %-100 %] 97 % (03/22 1216) Last BM Date: 06/23/16  General:  Well-developed, well-nourished and in no acute distress Head:  Head without obvious abnormality, atraumatic  Eyes:   Conjunctiva pink, sclera anicteric   ENT:   Mouth free of lesions, mucosa moist, tongue pink, no thrush noted, teeth and gums normal Neck:   Supple w/o thyromegaly or mass, trachea midline, no adenopathy  Lungs: Difficult to hear clearly through the airborne protective head gear, respirations unlabored laying flat Heart:     Normal S1S2, no rubs, murmurs, gallops. Abdomen: Soft, non-tender, no hepatosplenomegaly, hernia, or mass and BS normal Rectal: DRE: No hemorrhoids obvious, normal tone, brown stool- no melena or fresh blood. Extremities:   No edema, cyanosis, or clubbing Skin  Skin color normal, slight rash left chest with  shingles Neuro:  A&O x 3. CNII-XII intact, normal strength Psych:  Appropriate mood and affect.  Data Reviewed:  LAB RESULTS:  Recent Labs  06/23/16 1222 06/23/16 2102 06/24/16 0317  WBC 7.0  --  3.4*  HGB 11.1* 10.0* 10.1*  HCT 33.5*  --  30.2*  PLT 78*  --  53*   BMET  Recent Labs  06/23/16 1222 06/24/16 0317  NA 138 137  K 3.9 3.9  CL 99* 100*  CO2 29 30  GLUCOSE 102* 114*  BUN 34* 31*  CREATININE 1.78* 1.63*  CALCIUM 8.6* 7.9*   LFT  Recent Labs  06/23/16 1222  PROT 6.9  ALBUMIN 3.5  AST 23  ALT 15*  ALKPHOS 158*  BILITOT 1.0   PT/INR No results for input(s): LABPROT, INR in the last 72 hours.  STUDIES: Dg Chest 2 View  Result Date: 06/23/2016 CLINICAL DATA:  Red bloody stools since last night, on chemotherapy for lung cancer, LEFT chest rash diagnosed as shingles, history hypertension, gout EXAM: CHEST  2 VIEW COMPARISON:  04/10/2016 FINDINGS: RIGHT jugular Port-A-Cath with tip projecting over SVC. Normal heart size, mediastinal contours, and pulmonary vascularity. Volume loss in RIGHT hemithorax with scattered atelectasis. Probable RIGHT pleural effusion. LEFT lung clear. No pneumothorax. Sclerotic anterior and lateral RIGHT fourth rib. IMPRESSION: Increased atelectasis and pleural effusion in the RIGHT hemithorax with overall progressive volume loss. Sclerotic metastasis RIGHT fourth rib. Electronically Signed   By: Lavonia Dana M.D.   On: 06/23/2016 15:31   Ct Abdomen Pelvis W Contrast  Result Date: 06/23/2016 CLINICAL DATA:  56 year old male undergoing chemotherapy treatment for metastatic right lung adenocarcinoma. Two bright red bloody stools since last night. EXAM:  CT ABDOMEN AND PELVIS WITH CONTRAST TECHNIQUE: Multidetector CT imaging of the abdomen and pelvis was performed using the standard protocol following bolus administration of intravenous contrast. CONTRAST:  125m ISOVUE-300 IOPAMIDOL (ISOVUE-300) INJECTION 61% COMPARISON:  PET-CT 05/05/2016  and earlier. FINDINGS: Lower chest: Small right pleural effusion is stable since January. Streaky right lung base peribronchial opacity appears increased, peptic early in the posterior basal segment of the right lower lobe. The left lung base appears stable. No pericardial or left pleural effusion. Hepatobiliary: No definite metastatic disease to the liver. Small gallstone re- demonstrated. No pericholecystic inflammation. No biliary ductal enlargement. Pancreas: Negative. Spleen: Stable borderline to mild splenomegaly, no discrete splenic lesion. Adrenals/Urinary Tract: Negative adrenal glands. Bilateral renal enhancement and contrast excretion is within normal limits. Unremarkable urinary bladder. No abdominal free fluid. Stomach/Bowel: No rectosigmoid colon wall thickening or surrounding inflammation. There is a small fluid level mixed with stool in the rectum. Likewise the left colon is within normal limits. Occasional fluid levels in the transverse colon. Oral contrast has reached the cecum. No right colon inflammation. Negative appendix. Negative terminal ileum. No dilated or abnormal small bowel loops. Negative stomach and duodenum. Vascular/Lymphatic: Major arterial structures in the abdomen and pelvis appear patent and normal. Portal venous system is patent. No lymphadenopathy. Reproductive: Negative. Other: No pelvic free fluid. Musculoskeletal: Numerous predominantly sclerotic vertebral metastases appear stable since January. Superimposed lower lumbar severe facet arthropathy. Confluent sclerosis of the left acetabulum re- demonstrated. Confluent sclerotic lesion in the proximal right femoral shaft. No pathologic fracture identified. IMPRESSION: 1. No inflamed or abnormal bowel identified. No acute or inflammatory process identified in the abdomen or pelvis. 2. Widespread osseous metastatic disease appears stable since the January PET-CT. No other metastatic disease identified in the abdomen or pelvis. 3.  Continued small pleural effusion at the right lung base with nonspecific increased right lower lobe peribronchial opacity since the prior PET-CT. Differential considerations include lymphangitic carcinomatosis, atelectasis, and infection. 4. Chronic Cholelithiasis. Electronically Signed   By: HGenevie AnnM.D.   On: 06/23/2016 15:20     Assessment:  TMahari Spence a 56y.o. with past medical history of hypertension, GERD, COPD, lung cancer currently getting treatment to presented to the hospital due to rectal bleeding. DX with left chest shingles on Mon and is in airborne  isolation. His PCP ordered prednisone- which he has not yet filled. He is not on an anti-viral. CT reviewed and no GI findings on the CT.  Plan:  Rectal bleeding without pain or change in bowel habits and not hemodynamically significant. His slow decrease in Hgb last year may be related to CTX.   His current BRBPR x 2 is likely lower outlet bleed from hemorrhoids, no prior colonoscopy and met lung cancer so we do need to consider colon malignancy, lung mets to the colon, IH.   Hgb is stable, brown stool with DRE, no further passage of blood. No GI complaints.   The timing of his colonoscopy to be d/w Dr. EVira Agar He has been on clears all day. PSanduskycath. NKA. Airborne precautions.   This case to be discussed with Dr. RManya Silvasin collaboration of care. Thank you for the consultation.  These services provided by KDenice ParadiseRN, MSN, ANP-BC under collaborative practice agreement with RManya Silvas MD.  06/24/2016, 2:15 PM

## 2016-06-24 NOTE — Consult Note (Signed)
I am going to hold off on colonoscopy since NP Dawson Bills did not see any blood on rectal exam today but brown stool only.  Will see how things go over night since he has shingles eruption on left chest.  If bleeding recurs will need to reconsider.

## 2016-06-25 ENCOUNTER — Inpatient Hospital Stay: Payer: No Typology Code available for payment source

## 2016-06-25 MED ORDER — PANTOPRAZOLE SODIUM 40 MG PO TBEC: 40.0000 mg | DELAYED_RELEASE_TABLET | Freq: Every day | ORAL | 1 refills | Status: DC

## 2016-06-25 MED ORDER — VALACYCLOVIR HCL 1 G PO TABS: 1000.0000 mg | ORAL_TABLET | Freq: Three times a day (TID) | ORAL | 0 refills | Status: AC

## 2016-06-25 NOTE — Discharge Summary (Signed)
[place] [facility] [place] NAME: Stuart Spence    MR#:  [id number]  DATE OF BIRTH:  [birth year]  DATE OF ADMISSION:  06/23/2016 ADMITTING PHYSICIAN: Henreitta Leber, MD  DATE OF DISCHARGE: 06/25/2016  PRIMARY CARE PHYSICIAN: Golden Pop, MD    ADMISSION DIAGNOSIS:  Rectal bleeding [K62.5] Weakness [R53.1] Primary malignant neoplasm of right lung metastatic to other site [facility]) [C34.91]  DISCHARGE DIAGNOSIS:  Active Problems:   Rectal bleeding   SECONDARY DIAGNOSIS:   Past Medical History:  Diagnosis Date  . Cancer ([place])    Lung  . Depression   . ED (erectile dysfunction)   . Gout   . Hypertension   . On home oxygen therapy   . Personal history of chemotherapy    PT TAKING CHEMO EVERY 3 WEEKS    HOSPITAL COURSE:   56 year old male with past medical history of hypertension, GERD, COPD, lung cancer currently getting treatment to presented to the hospital due to rectal bleeding.  1. Rectal bleeding-Patient presented to the hospital with one episode of rectal bleeding. He was observed overnight in the hospital had no further bleeding. His hemoglobin remained stable. He did not require any transfusions. -He was seen by gastroenterology who did not plan for any acute intervention presently but follow-up with him as an outpatient and doing a colonoscopy in the next few weeks. Patient diet has been slowly advanced from clear to full liquid diet and he will be discharged on a full liquid diet and to advance at home as tolerated. -he was discharged on a PPI daily.  2. Shingles Rash - pt. Was noted to have a shingles rash on the left mid abdomen area towards his back.  He is no discomfort. He was discharged on some Valtrex.    3. History of hypertension- he will continue amlodipine, Avapro  4. Gout-no acute attack. He will cont. His colchicine.   4. Depression-he will continue Prozac.  5. Lung cancer currently getting ongoing  chemoradiation- he will cont. To follow with Dr. Mike Gip. - no acute issue presently.   DISCHARGE CONDITIONS:   Stable.   CONSULTS OBTAINED:  Treatment Team:  Manya Silvas, MD  DRUG ALLERGIES:  No Known Allergies  DISCHARGE MEDICATIONS:   Allergies as of 06/25/2016   No Known Allergies     Medication List    STOP taking these medications   predniSONE 10 MG tablet Commonly known as:  DELTASONE     TAKE these medications   albuterol 108 (90 Base) MCG/ACT inhaler Commonly known as:  PROVENTIL HFA;VENTOLIN HFA Inhale 1-2 puffs into the lungs See admin instructions. Reported on 09/12/2015   amLODipine 5 MG tablet Commonly known as:  NORVASC Take 1 tablet (5 mg total) by mouth daily.   CALCIUM 600 PO Take 1,200 mg by mouth 2 (two) times daily.   colchicine 0.6 MG tablet Take 0.6 mg by mouth daily as needed (two pills by mouth at onset, the one pill one hour later, maximum three pills per gout flare).   DELSYM PO Take by mouth.   dexamethasone 4 MG tablet Commonly known as:  DECADRON TAKE ONE TABLET BY MOUTH TWICE DAILY THE DAY BEFORE CHEMO, AND THE DAY AFTER CHEMO WITH EACH REGIMEN OF CARBO/ALIMTA   FLUoxetine 20 MG capsule Commonly known as:  PROZAC Take 2 capsules (40 mg total) by mouth every morning.   folic acid 1 MG tablet Commonly known as:  FOLVITE Take 1 tablet (1 mg  total) by mouth daily.   lidocaine-prilocaine cream Commonly known as:  EMLA Apply 1 application topically as needed.   nystatin powder Commonly known as:  nystatin Apply topically 3 (three) times daily as needed.   ondansetron 8 MG tablet Commonly known as:  ZOFRAN Take 1 tablet (8 mg total) by mouth every 8 (eight) hours as needed for nausea or vomiting.   oxyCODONE 5 MG immediate release tablet Commonly known as:  Oxy IR/ROXICODONE 1 tablet every 4-6 hours as need for pain   OXYGEN Inhale 2 L into the lungs continuous. Reported on 09/12/2015   pantoprazole 40 MG  tablet Commonly known as:  PROTONIX Take 1 tablet (40 mg total) by mouth daily.   sildenafil 100 MG tablet Commonly known as:  VIAGRA Take 1 tablet (100 mg total) by mouth daily as needed for erectile dysfunction.   telmisartan 40 MG tablet Commonly known as:  MICARDIS Take 1 tablet (40 mg total) by mouth daily.   valACYclovir 1000 MG tablet Commonly known as:  VALTREX Take 1 tablet (1,000 mg total) by mouth 3 (three) times daily.         DISCHARGE INSTRUCTIONS:   DIET:  Full liquid and advanced to Regular as tolerated.   DISCHARGE CONDITION:  Stable  ACTIVITY:  Activity as tolerated  OXYGEN:  Home Oxygen: No.   Oxygen Delivery: room air  DISCHARGE LOCATION:  home   If you experience worsening of your admission symptoms, develop shortness of breath, life threatening emergency, suicidal or homicidal thoughts you must seek medical attention immediately by calling 911 or calling your MD immediately  if symptoms less severe.  You Must read complete instructions/literature along with all the possible adverse reactions/side effects for all the Medicines you take and that have been prescribed to you. Take any new Medicines after you have completely understood and accpet all the possible adverse reactions/side effects.   Please note  You were cared for by a hospitalist during your hospital stay. If you have any questions about your discharge medications or the care you received while you were in the hospital after you are discharged, you can call the unit and asked to speak with the hospitalist on call if the hospitalist that took care of you is not available. Once you are discharged, your primary care physician will handle any further medical issues. Please note that NO REFILLS for any discharge medications will be authorized once you are discharged, as it is imperative that you return to your primary care physician (or establish a relationship with a primary care physician if  you do not have one) for your aftercare needs so that they can reassess your need for medications and monitor your lab values.     Today   NO acute bleeding overnight.  No other complaints.  No abdominal pain, N/V.   VITAL SIGNS:  Blood pressure 96/67, pulse 99, temperature 98 F (36.7 C), temperature source Oral, resp. rate 20, height '5\' 6"'$  (1.676 m), weight 106.6 kg (235 lb), SpO2 100 %.  I/O:   Intake/Output Summary (Last 24 hours) at 06/25/16 1411 Last data filed at 06/25/16 0519  Gross per 24 hour  Intake                0 ml  Output             1200 ml  Net            -1200 ml    PHYSICAL EXAMINATION:  GENERAL:  56 y.o.-year-old patient lying in the bed with no acute distress.  EYES: Pupils equal, round, reactive to light and accommodation. No scleral icterus. Extraocular muscles intact.  HEENT: Head atraumatic, normocephalic. Oropharynx and nasopharynx clear.  NECK:  Supple, no jugular venous distention. No thyroid enlargement, no tenderness.  LUNGS: Normal breath sounds bilaterally, no wheezing, rales,rhonchi. No use of accessory muscles of respiration.  CARDIOVASCULAR: S1, S2 normal. No murmurs, rubs, or gallops.  ABDOMEN: Soft, non-tender, non-distended. Bowel sounds present. No organomegaly or mass.  EXTREMITIES: No pedal edema, cyanosis, or clubbing.  NEUROLOGIC: Cranial nerves II through XII are intact. No focal motor or sensory defecits b/l.  PSYCHIATRIC: The patient is alert and oriented x 3. Good affect.  SKIN: No obvious rash, lesion, or ulcer. Shingles rash noted on left mid abdomen spreading towards mid back.   DATA REVIEW:   CBC  Recent Labs Lab 06/24/16 0317  WBC 3.4*  HGB 10.1*  HCT 30.2*  PLT 53*    Chemistries   Recent Labs Lab 06/23/16 1222 06/24/16 0317  NA 138 137  K 3.9 3.9  CL 99* 100*  CO2 29 30  GLUCOSE 102* 114*  BUN 34* 31*  CREATININE 1.78* 1.63*  CALCIUM 8.6* 7.9*  AST 23  --   ALT 15*  --   ALKPHOS 158*  --   BILITOT  1.0  --     Cardiac Enzymes No results for input(s): TROPONINI in the last 168 hours.  Microbiology Results  Results for orders placed or performed in visit on 06/22/16  Microscopic Examination     Status: None   Collection Time: 06/22/16  2:50 PM  Result Value Ref Range Status   WBC, UA 0-5 0 - 5 /hpf Final   RBC, UA None seen 0 - 2 /hpf Final   Epithelial Cells (non renal) CANCELED      Comment: Test not performed  Result canceled by the ancillary    Bacteria, UA None seen None seen/Few Final    RADIOLOGY:  Dg Chest 2 View  Result Date: 06/23/2016 CLINICAL DATA:  Red bloody stools since last night, on chemotherapy for lung cancer, LEFT chest rash diagnosed as shingles, history hypertension, gout EXAM: CHEST  2 VIEW COMPARISON:  04/10/2016 FINDINGS: RIGHT jugular Port-A-Cath with tip projecting over SVC. Normal heart size, mediastinal contours, and pulmonary vascularity. Volume loss in RIGHT hemithorax with scattered atelectasis. Probable RIGHT pleural effusion. LEFT lung clear. No pneumothorax. Sclerotic anterior and lateral RIGHT fourth rib. IMPRESSION: Increased atelectasis and pleural effusion in the RIGHT hemithorax with overall progressive volume loss. Sclerotic metastasis RIGHT fourth rib. Electronically Signed   By: Lavonia Dana M.D.   On: 06/23/2016 15:31   Ct Abdomen Pelvis W Contrast  Result Date: 06/23/2016 CLINICAL DATA:  56 year old male undergoing chemotherapy treatment for metastatic right lung adenocarcinoma. Two bright red bloody stools since last night. EXAM: CT ABDOMEN AND PELVIS WITH CONTRAST TECHNIQUE: Multidetector CT imaging of the abdomen and pelvis was performed using the standard protocol following bolus administration of intravenous contrast. CONTRAST:  128m ISOVUE-300 IOPAMIDOL (ISOVUE-300) INJECTION 61% COMPARISON:  PET-CT 05/05/2016 and earlier. FINDINGS: Lower chest: Small right pleural effusion is stable since January. Streaky right lung base  peribronchial opacity appears increased, peptic early in the posterior basal segment of the right lower lobe. The left lung base appears stable. No pericardial or left pleural effusion. Hepatobiliary: No definite metastatic disease to the liver. Small gallstone re- demonstrated. No pericholecystic inflammation. No biliary ductal enlargement. Pancreas: Negative.  Spleen: Stable borderline to mild splenomegaly, no discrete splenic lesion. Adrenals/Urinary Tract: Negative adrenal glands. Bilateral renal enhancement and contrast excretion is within normal limits. Unremarkable urinary bladder. No abdominal free fluid. Stomach/Bowel: No rectosigmoid colon wall thickening or surrounding inflammation. There is a small fluid level mixed with stool in the rectum. Likewise the left colon is within normal limits. Occasional fluid levels in the transverse colon. Oral contrast has reached the cecum. No right colon inflammation. Negative appendix. Negative terminal ileum. No dilated or abnormal small bowel loops. Negative stomach and duodenum. Vascular/Lymphatic: Major arterial structures in the abdomen and pelvis appear patent and normal. Portal venous system is patent. No lymphadenopathy. Reproductive: Negative. Other: No pelvic free fluid. Musculoskeletal: Numerous predominantly sclerotic vertebral metastases appear stable since January. Superimposed lower lumbar severe facet arthropathy. Confluent sclerosis of the left acetabulum re- demonstrated. Confluent sclerotic lesion in the proximal right femoral shaft. No pathologic fracture identified. IMPRESSION: 1. No inflamed or abnormal bowel identified. No acute or inflammatory process identified in the abdomen or pelvis. 2. Widespread osseous metastatic disease appears stable since the January PET-CT. No other metastatic disease identified in the abdomen or pelvis. 3. Continued small pleural effusion at the right lung base with nonspecific increased right lower lobe peribronchial  opacity since the prior PET-CT. Differential considerations include lymphangitic carcinomatosis, atelectasis, and infection. 4. Chronic Cholelithiasis. Electronically Signed   By: Genevie Ann M.D.   On: 06/23/2016 15:20      Management plans discussed with the patient, family and they are in agreement.  CODE STATUS:     Code Status Orders        Start     Ordered   06/23/16 2052  Full code  Continuous     06/23/16 2051    Code Status History    Date Active Date Inactive Code Status Order ID Comments User Context   08/10/2015  5:01 PM 08/17/2015  4:50 PM Full Code [id number]  Lytle But[place], MD ED    Advance Directive Documentation     Most Recent Value  Type of Advance Directive  Healthcare Power of [place], Living will  Pre-existing out of facility DNR order (yellow form or pink MOST form)  -  "MOST" Form in Place?  -      TOTAL TIME TAKING CARE OF THIS PATIENT: 40 minutes.    Henreitta Leber M.D on 06/25/2016 at 2:11 PM  Between 7am to 6pm - Pager - [phone]  After 6pm go to [email or web address] - Proofreader  [facility]  Office  [phone]  CC: Primary care physician; Golden Pop, MD

## 2016-06-25 NOTE — Progress Notes (Signed)
Discussed discharge instructions and medications with pt and his wife. IV removed. All questions addressed. Pt transported home via car by his wife.  Clarise Cruz, RN

## 2016-07-02 ENCOUNTER — Encounter: Payer: Self-pay | Admitting: Family Medicine

## 2016-07-02 ENCOUNTER — Ambulatory Visit (INDEPENDENT_AMBULATORY_CARE_PROVIDER_SITE_OTHER): Payer: PRIVATE HEALTH INSURANCE | Admitting: Family Medicine

## 2016-07-02 VITALS — BP 106/71 | HR 99 | Temp 98.2°F | Resp 20 | Wt 232.2 lb

## 2016-07-02 DIAGNOSIS — K625 Hemorrhage of anus and rectum: Secondary | ICD-10-CM

## 2016-07-02 LAB — CBC WITH DIFFERENTIAL/PLATELET
Hematocrit: 37.3 % — ABNORMAL LOW (ref 37.5–51.0)
Hemoglobin: 12.3 g/dL — ABNORMAL LOW (ref 13.0–17.7)
LYMPHS ABS: 2.9 10*3/uL (ref 0.7–3.1)
LYMPHS: 13 %
MCH: 36.8 pg — ABNORMAL HIGH (ref 26.6–33.0)
MCHC: 33 g/dL (ref 31.5–35.7)
MCV: 112 fL — ABNORMAL HIGH (ref 79–97)
MID (ABSOLUTE): 1.6 10*3/uL (ref 0.1–1.6)
MID: 7 %
NEUTROS ABS: 17.2 10*3/uL — AB (ref 1.4–7.0)
NEUTROS PCT: 80 %
PLATELETS: 99 10*3/uL — AB (ref 150–379)
RBC: 3.34 x10E6/uL — ABNORMAL LOW (ref 4.14–5.80)
RDW: 18.3 % — AB (ref 12.3–15.4)
WBC: 21.7 10*3/uL (ref 3.4–10.8)

## 2016-07-02 NOTE — Progress Notes (Signed)
BP 106/71 (BP Location: Right Arm, Patient Position: Sitting, Cuff Size: Large)   Pulse 99   Temp 98.2 F (36.8 C)   Resp 20   Wt 232 lb 3.2 oz (105.3 kg)   SpO2 96%   BMI 37.48 kg/m    Subjective:    Patient ID: Stuart Spence, male    DOB: 1960-09-15, 56 y.o.   MRN: [id number]  HPI: Stuart Spence is a 56 y.o. male  Chief Complaint  Patient presents with  . Hospitalization Follow-up    Patient states he feels better. Said "I feel good". No complaints.   Patient presents for Hospital f/u for rectal bleeding. Feeling well, no concerns or symptoms since discharge and no further episodes of bleeding. Was admitted for observation, had no recurrence of bleeding after initial episode. CBC remained stable throughout stay. No abdominal pain, fever, fatigue, CP, worsened SOB. Labs and progress notes from hospitalization reviewed. Has appt with GI toward the end of April for colonoscopy and further investigation. Followed by Dr. Mike Gip for chemotherapy for Stage IV lung cancer every 3 weeks.    Was noted to have shingles of left upper back in hospital. D/c'd home with valtrex and has 2 tabs left. States rash is cleared and not having any discomfort in the area.   Relevant past medical, surgical, family and social history reviewed and updated as indicated. Interim medical history since our last visit reviewed. Allergies and medications reviewed and updated.  Review of Systems  Constitutional: Negative.   HENT: Negative.   Eyes: Negative.   Respiratory: Negative.   Cardiovascular: Negative.   Gastrointestinal: Negative.   Genitourinary: Negative.   Musculoskeletal: Negative.   Skin: Negative.   Neurological: Negative.   Psychiatric/Behavioral: Negative.     Per HPI unless specifically indicated above     Objective:    BP 106/71 (BP Location: Right Arm, Patient Position: Sitting, Cuff Size: Large)   Pulse 99   Temp 98.2 F (36.8 C)   Resp 20   Wt 232 lb 3.2 oz (105.3 kg)    SpO2 96%   BMI 37.48 kg/m   Wt Readings from Last 3 Encounters:  07/02/16 232 lb 3.2 oz (105.3 kg)  06/23/16 235 lb (106.6 kg)  06/22/16 235 lb (106.6 kg)    Physical Exam  Constitutional: He appears well-developed and well-nourished. No distress.  HENT:  Head: Atraumatic.  Eyes: Conjunctivae are normal. Pupils are equal, round, and reactive to light.  Cardiovascular: Normal rate and normal heart sounds.   Pulmonary/Chest: Effort normal and breath sounds normal. No respiratory distress.  Abdominal: Soft. There is no tenderness.  Musculoskeletal: Normal range of motion.  Neurological: He is alert.  Skin: Skin is warm and dry. No rash noted.  Psychiatric: He has a normal mood and affect. His behavior is normal.  Nursing note and vitals reviewed.   Results for orders placed or performed during the hospital encounter of 06/23/16  Comprehensive metabolic panel  Result Value Ref Range   Sodium 138 135 - 145 mmol/L   Potassium 3.9 3.5 - 5.1 mmol/L   Chloride 99 (L) 101 - 111 mmol/L   CO2 29 22 - 32 mmol/L   Glucose, Bld 102 (H) 65 - 99 mg/dL   BUN 34 (H) 6 - 20 mg/dL   Creatinine, Ser 1.78 (H) 0.61 - 1.24 mg/dL   Calcium 8.6 (L) 8.9 - 10.3 mg/dL   Total Protein 6.9 6.5 - 8.1 g/dL   Albumin 3.5 3.5 - 5.0 g/dL  AST 23 15 - 41 U/L   ALT 15 (L) 17 - 63 U/L   Alkaline Phosphatase 158 (H) 38 - 126 U/L   Total Bilirubin 1.0 0.3 - 1.2 mg/dL   GFR calc non Af Amer 41 (L) >60 mL/min   GFR calc Af Amer 48 (L) >60 mL/min   Anion gap 10 5 - 15  CBC  Result Value Ref Range   WBC 7.0 3.8 - 10.6 K/uL   RBC 3.12 (L) 4.40 - 5.90 MIL/uL   Hemoglobin 11.1 (L) 13.0 - 18.0 g/dL   HCT 33.5 (L) 40.0 - 52.0 %   MCV 107.5 (H) 80.0 - 100.0 fL   MCH 35.7 (H) 26.0 - 34.0 pg   MCHC 33.2 32.0 - 36.0 g/dL   RDW 17.5 (H) 11.5 - 14.5 %   Platelets 78 (L) 150 - 440 K/uL  Basic metabolic panel  Result Value Ref Range   Sodium 137 135 - 145 mmol/L   Potassium 3.9 3.5 - 5.1 mmol/L   Chloride 100 (L)  101 - 111 mmol/L   CO2 30 22 - 32 mmol/L   Glucose, Bld 114 (H) 65 - 99 mg/dL   BUN 31 (H) 6 - 20 mg/dL   Creatinine, Ser 1.63 (H) 0.61 - 1.24 mg/dL   Calcium 7.9 (L) 8.9 - 10.3 mg/dL   GFR calc non Af Amer 46 (L) >60 mL/min   GFR calc Af Amer 53 (L) >60 mL/min   Anion gap 7 5 - 15  CBC  Result Value Ref Range   WBC 3.4 (L) 3.8 - 10.6 K/uL   RBC 2.81 (L) 4.40 - 5.90 MIL/uL   Hemoglobin 10.1 (L) 13.0 - 18.0 g/dL   HCT 30.2 (L) 40.0 - 52.0 %   MCV 107.4 (H) 80.0 - 100.0 fL   MCH 35.9 (H) 26.0 - 34.0 pg   MCHC 33.4 32.0 - 36.0 g/dL   RDW 17.2 (H) 11.5 - 14.5 %   Platelets 53 (L) 150 - 440 K/uL  Hemoglobin  Result Value Ref Range   Hemoglobin 10.0 (L) 13.0 - 18.0 g/dL  Type and screen [facility]  Result Value Ref Range   ABO/RH(D) B POS    Antibody Screen NEG    Sample Expiration 06/26/2016       Assessment & Plan:   Problem List Items Addressed This Visit      Digestive   Rectal bleeding - Primary   Relevant Orders   CBC With Differential/Platelet    Repeat CBC today largely stable from previous, notable leukocytosis but this has been variable for him d/t treatments. Will monitor closely for systemic illness, afebrile and asymptomatic today in clinic. Has GI and oncology f/u scheduled. Return precautions given.   Follow up plan: Return for as scheduled.

## 2016-07-02 NOTE — Patient Instructions (Signed)
Follow up as needed

## 2016-07-05 ENCOUNTER — Other Ambulatory Visit: Payer: Self-pay | Admitting: Hematology and Oncology

## 2016-07-05 DIAGNOSIS — C3491 Malignant neoplasm of unspecified part of right bronchus or lung: Secondary | ICD-10-CM

## 2016-07-08 ENCOUNTER — Other Ambulatory Visit: Payer: Self-pay | Admitting: *Deleted

## 2016-07-08 ENCOUNTER — Inpatient Hospital Stay: Payer: No Typology Code available for payment source | Attending: Oncology

## 2016-07-08 ENCOUNTER — Encounter
Admission: RE | Admit: 2016-07-08 | Discharge: 2016-07-08 | Disposition: A | Discharge status: Home / Self Care | Payer: No Typology Code available for payment source | Source: Ambulatory Visit | Attending: Oncology | Admitting: Oncology

## 2016-07-08 DIAGNOSIS — N189 Chronic kidney disease, unspecified: Secondary | ICD-10-CM | POA: Insufficient documentation

## 2016-07-08 DIAGNOSIS — D649 Anemia, unspecified: Secondary | ICD-10-CM | POA: Insufficient documentation

## 2016-07-08 DIAGNOSIS — R197 Diarrhea, unspecified: Secondary | ICD-10-CM | POA: Insufficient documentation

## 2016-07-08 DIAGNOSIS — R5383 Other fatigue: Secondary | ICD-10-CM | POA: Insufficient documentation

## 2016-07-08 DIAGNOSIS — C3411 Malignant neoplasm of upper lobe, right bronchus or lung: Secondary | ICD-10-CM | POA: Insufficient documentation

## 2016-07-08 DIAGNOSIS — Z7689 Persons encountering health services in other specified circumstances: Secondary | ICD-10-CM | POA: Diagnosis not present

## 2016-07-08 DIAGNOSIS — M545 Low back pain: Secondary | ICD-10-CM | POA: Insufficient documentation

## 2016-07-08 DIAGNOSIS — M109 Gout, unspecified: Secondary | ICD-10-CM | POA: Insufficient documentation

## 2016-07-08 DIAGNOSIS — K921 Melena: Secondary | ICD-10-CM | POA: Insufficient documentation

## 2016-07-08 DIAGNOSIS — R42 Dizziness and giddiness: Secondary | ICD-10-CM | POA: Insufficient documentation

## 2016-07-08 DIAGNOSIS — N289 Disorder of kidney and ureter, unspecified: Secondary | ICD-10-CM | POA: Insufficient documentation

## 2016-07-08 DIAGNOSIS — R630 Anorexia: Secondary | ICD-10-CM | POA: Insufficient documentation

## 2016-07-08 DIAGNOSIS — R531 Weakness: Secondary | ICD-10-CM | POA: Insufficient documentation

## 2016-07-08 DIAGNOSIS — C3491 Malignant neoplasm of unspecified part of right bronchus or lung: Secondary | ICD-10-CM

## 2016-07-08 DIAGNOSIS — R112 Nausea with vomiting, unspecified: Secondary | ICD-10-CM | POA: Insufficient documentation

## 2016-07-08 DIAGNOSIS — Z9181 History of falling: Secondary | ICD-10-CM | POA: Diagnosis not present

## 2016-07-08 DIAGNOSIS — R978 Other abnormal tumor markers: Secondary | ICD-10-CM | POA: Diagnosis present

## 2016-07-08 DIAGNOSIS — Z79899 Other long term (current) drug therapy: Secondary | ICD-10-CM | POA: Insufficient documentation

## 2016-07-08 DIAGNOSIS — Z7952 Long term (current) use of systemic steroids: Secondary | ICD-10-CM | POA: Insufficient documentation

## 2016-07-08 DIAGNOSIS — E860 Dehydration: Secondary | ICD-10-CM | POA: Insufficient documentation

## 2016-07-08 DIAGNOSIS — R634 Abnormal weight loss: Secondary | ICD-10-CM | POA: Insufficient documentation

## 2016-07-08 DIAGNOSIS — B029 Zoster without complications: Secondary | ICD-10-CM | POA: Diagnosis not present

## 2016-07-08 DIAGNOSIS — C7951 Secondary malignant neoplasm of bone: Secondary | ICD-10-CM | POA: Diagnosis not present

## 2016-07-08 DIAGNOSIS — Z77098 Contact with and (suspected) exposure to other hazardous, chiefly nonmedicinal, chemicals: Secondary | ICD-10-CM | POA: Insufficient documentation

## 2016-07-08 DIAGNOSIS — I10 Essential (primary) hypertension: Secondary | ICD-10-CM | POA: Insufficient documentation

## 2016-07-08 DIAGNOSIS — R970 Elevated carcinoembryonic antigen [CEA]: Secondary | ICD-10-CM | POA: Diagnosis not present

## 2016-07-08 DIAGNOSIS — J90 Pleural effusion, not elsewhere classified: Secondary | ICD-10-CM | POA: Insufficient documentation

## 2016-07-08 DIAGNOSIS — C771 Secondary and unspecified malignant neoplasm of intrathoracic lymph nodes: Secondary | ICD-10-CM | POA: Insufficient documentation

## 2016-07-08 DIAGNOSIS — I129 Hypertensive chronic kidney disease with stage 1 through stage 4 chronic kidney disease, or unspecified chronic kidney disease: Secondary | ICD-10-CM | POA: Insufficient documentation

## 2016-07-08 DIAGNOSIS — Z5111 Encounter for antineoplastic chemotherapy: Secondary | ICD-10-CM | POA: Insufficient documentation

## 2016-07-08 DIAGNOSIS — Z801 Family history of malignant neoplasm of trachea, bronchus and lung: Secondary | ICD-10-CM | POA: Insufficient documentation

## 2016-07-08 DIAGNOSIS — E669 Obesity, unspecified: Secondary | ICD-10-CM | POA: Insufficient documentation

## 2016-07-08 DIAGNOSIS — Z9981 Dependence on supplemental oxygen: Secondary | ICD-10-CM | POA: Insufficient documentation

## 2016-07-08 DIAGNOSIS — J320 Chronic maxillary sinusitis: Secondary | ICD-10-CM | POA: Insufficient documentation

## 2016-07-08 LAB — COMPREHENSIVE METABOLIC PANEL
ALK PHOS: 133 U/L — AB (ref 38–126)
ALT: 25 U/L (ref 17–63)
AST: 22 U/L (ref 15–41)
Albumin: 4 g/dL (ref 3.5–5.0)
Anion gap: 7 (ref 5–15)
BUN: 24 mg/dL — AB (ref 6–20)
CALCIUM: 8.4 mg/dL — AB (ref 8.9–10.3)
CO2: 28 mmol/L (ref 22–32)
CREATININE: 2.3 mg/dL — AB (ref 0.61–1.24)
Chloride: 100 mmol/L — ABNORMAL LOW (ref 101–111)
GFR calc non Af Amer: 30 mL/min — ABNORMAL LOW (ref >60)
GFR, EST AFRICAN AMERICAN: 35 mL/min — AB (ref >60)
GLUCOSE: 105 mg/dL — AB (ref 65–99)
Potassium: 5 mmol/L (ref 3.5–5.1)
Sodium: 135 mmol/L (ref 135–145)
Total Bilirubin: 0.6 mg/dL (ref 0.3–1.2)
Total Protein: 8 g/dL (ref 6.5–8.1)

## 2016-07-08 LAB — CBC WITH DIFFERENTIAL/PLATELET
BASOS PCT: 1 %
Basophils Absolute: 0.1 10*3/uL (ref 0–0.1)
EOS ABS: 0.2 10*3/uL (ref 0–0.7)
EOS PCT: 2 %
HCT: 34.3 % — ABNORMAL LOW (ref 40.0–52.0)
Hemoglobin: 11.8 g/dL — ABNORMAL LOW (ref 13.0–18.0)
Lymphocytes Relative: 15 %
Lymphs Abs: 1.8 10*3/uL (ref 1.0–3.6)
MCH: 37.1 pg — AB (ref 26.0–34.0)
MCHC: 34.4 g/dL (ref 32.0–36.0)
MCV: 107.9 fL — ABNORMAL HIGH (ref 80.0–100.0)
MONO ABS: 1.2 10*3/uL — AB (ref 0.2–1.0)
MONOS PCT: 10 %
Neutro Abs: 8.8 10*3/uL — ABNORMAL HIGH (ref 1.4–6.5)
Neutrophils Relative %: 72 %
Platelets: 215 10*3/uL (ref 150–440)
RBC: 3.18 MIL/uL — ABNORMAL LOW (ref 4.40–5.90)
RDW: 17.8 % — AB (ref 11.5–14.5)
WBC: 12.1 10*3/uL — ABNORMAL HIGH (ref 3.8–10.6)

## 2016-07-08 LAB — PROTEIN, URINE, RANDOM: Total Protein, Urine: 9 mg/dL

## 2016-07-08 LAB — GLUCOSE, CAPILLARY: Glucose-Capillary: 98 mg/dL (ref 65–99)

## 2016-07-08 MED ORDER — FLUDEOXYGLUCOSE F - 18 (FDG) INJECTION
12.5000 | Freq: Once | INTRAVENOUS | Status: AC | PRN
  Administered 2016-07-08: 13.252 via INTRAVENOUS

## 2016-07-09 ENCOUNTER — Inpatient Hospital Stay: Payer: No Typology Code available for payment source

## 2016-07-09 ENCOUNTER — Inpatient Hospital Stay (HOSPITAL_BASED_OUTPATIENT_CLINIC_OR_DEPARTMENT_OTHER): Payer: No Typology Code available for payment source | Admitting: Internal Medicine

## 2016-07-09 DIAGNOSIS — B029 Zoster without complications: Secondary | ICD-10-CM | POA: Diagnosis not present

## 2016-07-09 DIAGNOSIS — M109 Gout, unspecified: Secondary | ICD-10-CM

## 2016-07-09 DIAGNOSIS — N189 Chronic kidney disease, unspecified: Secondary | ICD-10-CM | POA: Diagnosis not present

## 2016-07-09 DIAGNOSIS — I129 Hypertensive chronic kidney disease with stage 1 through stage 4 chronic kidney disease, or unspecified chronic kidney disease: Secondary | ICD-10-CM | POA: Diagnosis not present

## 2016-07-09 DIAGNOSIS — Z7189 Other specified counseling: Secondary | ICD-10-CM | POA: Insufficient documentation

## 2016-07-09 DIAGNOSIS — Z801 Family history of malignant neoplasm of trachea, bronchus and lung: Secondary | ICD-10-CM

## 2016-07-09 DIAGNOSIS — C3411 Malignant neoplasm of upper lobe, right bronchus or lung: Secondary | ICD-10-CM | POA: Diagnosis not present

## 2016-07-09 DIAGNOSIS — E669 Obesity, unspecified: Secondary | ICD-10-CM | POA: Diagnosis not present

## 2016-07-09 DIAGNOSIS — C7951 Secondary malignant neoplasm of bone: Secondary | ICD-10-CM

## 2016-07-09 DIAGNOSIS — M545 Low back pain: Secondary | ICD-10-CM | POA: Diagnosis not present

## 2016-07-09 DIAGNOSIS — J320 Chronic maxillary sinusitis: Secondary | ICD-10-CM | POA: Diagnosis not present

## 2016-07-09 DIAGNOSIS — Z5111 Encounter for antineoplastic chemotherapy: Secondary | ICD-10-CM | POA: Diagnosis not present

## 2016-07-09 DIAGNOSIS — R970 Elevated carcinoembryonic antigen [CEA]: Secondary | ICD-10-CM

## 2016-07-09 DIAGNOSIS — J90 Pleural effusion, not elsewhere classified: Secondary | ICD-10-CM

## 2016-07-09 DIAGNOSIS — Z9981 Dependence on supplemental oxygen: Secondary | ICD-10-CM

## 2016-07-09 DIAGNOSIS — Z79899 Other long term (current) drug therapy: Secondary | ICD-10-CM

## 2016-07-09 LAB — CEA: CEA: 853.1 ng/mL — AB (ref 0.0–4.7)

## 2016-07-09 NOTE — Progress Notes (Signed)
[facility] OFFICE PROGRESS NOTE  Patient Care Team: Guadalupe Maple, MD as PCP - General (Family Medicine) Nestor Lewandowsky, MD as Referring Physician (Cardiothoracic Surgery) Laverle Hobby, MD as Consulting Physician (Pulmonary Disease)  Cancer Staging Adenocarcinoma, lung [facility]) Staging form: Lung, AJCC 7th Edition - Clinical stage from 08/20/2015: Stage IV (T3, N2, M1b) - Signed by Lequita Asal, MD on 08/21/2015 - Pathologic: No stage assigned - Unsigned      Cancer of upper lobe of right lung ([place])   07/09/2016 Initial Diagnosis    Cancer of upper lobe of right lung ([place])       The pt is being seen in absence of Dr.Corcoran. Patient's previous records were reviewed in detail.    INTERVAL HISTORY:  Stuart Spence 56 y.o.  male pleasant patient above history of Metastatic adenocarcinoma lung is here to review the results of his restaging PET scan.  In the interim patient was admitted to the hospital for rectal bleeding; evaluated by GI;  this was thought to be hemorrhoidal and hence no procedures are performed.Also noted to have shingles; status post valacyclovir.   Patient complains of mild-to-moderate low back pain. This is not any worse. He is on oxycodone every 6-8 hours. He does not complain of any new pain anywhere else. Appetite is fair. Denies any worsening shortness of breath. No chest pain. No cough or hemoptysis. Denies any tingling or numbness. Denies any headaches. Patient's a shingles rash is improving. Scabbing. Denies any significant pain in his left chest wall.   REVIEW OF SYSTEMS:  A complete 10 point review of system is done which is negative except mentioned above/history of present illness.   PAST MEDICAL HISTORY :  Past Medical History:  Diagnosis Date  . Cancer ([place])    Lung  . Depression   . ED (erectile dysfunction)   . Gout   . Hypertension   . On home oxygen therapy   . Personal history of chemotherapy    PT TAKING CHEMO  EVERY 3 WEEKS    PAST SURGICAL HISTORY :   Past Surgical History:  Procedure Laterality Date  . CHEST TUBE INSERTION Left 08/13/2015   Procedure: CHEST TUBE INSERTION;  Surgeon: Nestor Lewandowsky, MD;  Location: [facility] ORS;  Service: General;  Laterality: Left;  . CHEST TUBE INSERTION N/A 09/18/2015   Procedure: PLEURX CATH REMOVAL;  Surgeon: Nestor Lewandowsky, MD;  Location: [facility] ORS;  Service: Thoracic;  Laterality: N/A;  . PORTACATH PLACEMENT Left 08/13/2015   Procedure: INSERTION PORT-A-CATH;  Surgeon: Nestor Lewandowsky, MD;  Location: [facility] ORS;  Service: General;  Laterality: Left;  . PORTACATH PLACEMENT    . TONSILLECTOMY      FAMILY HISTORY :   Family History  Problem Relation Age of Onset  . Cancer Mother 62    lung  . Heart attack Father 79  . Hypertension Father   . Congestive Heart Failure Father 7    died from  . Diabetes Sister     SOCIAL HISTORY:   Social History  Substance Use Topics  . Smoking status: Never Smoker  . Smokeless tobacco: Never Used  . Alcohol use No     Comment: gave it up in August 2016    ALLERGIES:  has No Known Allergies.  MEDICATIONS:  Current Outpatient Prescriptions  Medication Sig Dispense Refill  . albuterol (PROVENTIL HFA;VENTOLIN HFA) 108 (90 Base) MCG/ACT inhaler Inhale 1-2 puffs into the lungs See admin instructions. Reported on 09/12/2015    .  amLODipine (NORVASC) 5 MG tablet Take 1 tablet (5 mg total) by mouth daily. 30 tablet 12  . Calcium Carbonate (CALCIUM 600 PO) Take 1,200 mg by mouth 2 (two) times daily.    . colchicine 0.6 MG tablet Take 0.6 mg by mouth daily as needed (two pills by mouth at onset, the one pill one hour later, maximum three pills per gout flare).    Marland Kitchen dexamethasone (DECADRON) 4 MG tablet TAKE ONE TABLET BY MOUTH TWICE DAILY THE DAY BEFORE CHEMO, AND THE DAY AFTER CHEMO WITH EACH REGIMEN OF CARBO/ALIMTA 36 tablet 0  . Dextromethorphan Polistirex (DELSYM PO) Take by mouth.    Marland Kitchen FLUoxetine (PROZAC) 20 MG capsule Take 2  capsules (40 mg total) by mouth every morning. 60 capsule 12  . folic acid (FOLVITE) 1 MG tablet Take 1 tablet (1 mg total) by mouth daily. 30 tablet 3  . lidocaine-prilocaine (EMLA) cream Apply 1 application topically as needed. 30 g 2  . nystatin (NYSTATIN) powder Apply topically 3 (three) times daily as needed. 30 g 1  . ondansetron (ZOFRAN) 8 MG tablet Take 1 tablet (8 mg total) by mouth every 8 (eight) hours as needed for nausea or vomiting. 20 tablet 3  . oxyCODONE (OXY IR/ROXICODONE) 5 MG immediate release tablet 1 tablet every 4-6 hours as need for pain 30 tablet 0  . pantoprazole (PROTONIX) 40 MG tablet Take 1 tablet (40 mg total) by mouth daily. 30 tablet 1  . sildenafil (VIAGRA) 100 MG tablet Take 1 tablet (100 mg total) by mouth daily as needed for erectile dysfunction. 10 tablet 12  . telmisartan (MICARDIS) 40 MG tablet Take 1 tablet (40 mg total) by mouth daily. 30 tablet 12  . OXYGEN Inhale 2 L into the lungs continuous. Reported on 09/12/2015     No current facility-administered medications for this visit.     PHYSICAL EXAMINATION: ECOG PERFORMANCE STATUS: 1 - Symptomatic but completely ambulatory  BP 135/87 (BP Location: Right Arm, Patient Position: Sitting)   Pulse 98   Temp (!) 96.4 F (35.8 C) (Tympanic)   Resp 16   Wt 234 lb 2 oz (106.2 kg)   SpO2 98%   BMI 37.79 kg/m   Filed Weights   07/09/16 0836  Weight: 234 lb 2 oz (106.2 kg)    GENERAL: Well-nourished well-developed; Alert, no distress and comfortable.   Accompanied by his wife. Obese. EYES: no pallor or icterus OROPHARYNX: no thrush or ulceration; good dentition  NECK: supple, no masses felt LYMPH:  no palpable lymphadenopathy in the cervical, axillary or inguinal regions LUNGS: Decreased sounds to auscultation bilaterally. Right more than left. and  No wheeze or crackles HEART/CVS: regular rate & rhythm and no murmurs; No lower extremity edema ABDOMEN:abdomen soft, non-tender and normal bowel  sounds Musculoskeletal:no cyanosis of digits and no clubbing  PSYCH: alert & oriented x 3 with fluent speech NEURO: no focal motor/sensory deficits SKIN:  Scabbing lesions noted in the left chest wall; no active infection.  LABORATORY DATA:  I have reviewed the data as listed    Component Value Date/Time   NA 135 07/08/2016 0831   NA 146 (H) 06/22/2016 1450   K 5.0 07/08/2016 0831   CL 100 (L) 07/08/2016 0831   CO2 28 07/08/2016 0831   GLUCOSE 105 (H) 07/08/2016 0831   BUN 24 (H) 07/08/2016 0831   BUN 27 (H) 06/22/2016 1450   CREATININE 2.30 (H) 07/08/2016 0831   CALCIUM 8.4 (L) 07/08/2016 0831   PROT  8.0 07/08/2016 0831   PROT 6.7 06/22/2016 1450   ALBUMIN 4.0 07/08/2016 0831   ALBUMIN 3.8 06/22/2016 1450   AST 22 07/08/2016 0831   ALT 25 07/08/2016 0831   ALKPHOS 133 (H) 07/08/2016 0831   BILITOT 0.6 07/08/2016 0831   BILITOT 0.5 06/22/2016 1450   GFRNONAA 30 (L) 07/08/2016 0831   GFRAA 35 (L) 07/08/2016 0831    No results found for: SPEP, UPEP  Lab Results  Component Value Date   WBC 12.1 (H) 07/08/2016   NEUTROABS 8.8 (H) 07/08/2016   HGB 11.8 (L) 07/08/2016   HCT 34.3 (L) 07/08/2016   MCV 107.9 (H) 07/08/2016   PLT 215 07/08/2016      Chemistry      Component Value Date/Time   NA 135 07/08/2016 0831   NA 146 (H) 06/22/2016 1450   K 5.0 07/08/2016 0831   CL 100 (L) 07/08/2016 0831   CO2 28 07/08/2016 0831   BUN 24 (H) 07/08/2016 0831   BUN 27 (H) 06/22/2016 1450   CREATININE 2.30 (H) 07/08/2016 0831      Component Value Date/Time   CALCIUM 8.4 (L) 07/08/2016 0831   ALKPHOS 133 (H) 07/08/2016 0831   AST 22 07/08/2016 0831   ALT 25 07/08/2016 0831   BILITOT 0.6 07/08/2016 0831   BILITOT 0.5 06/22/2016 1450       RADIOGRAPHIC STUDIES: I have personally reviewed the radiological images as listed and agreed with the findings in the report. Nm Pet Image Restag (ps) Skull Base To Thigh  Result Date: 07/08/2016 CLINICAL DATA:  Subsequent treatment  strategy for right lung adenocarcinoma with osseous metastatic disease. EXAM: NUCLEAR MEDICINE PET SKULL BASE TO THIGH TECHNIQUE: 13.3 mCi F-18 FDG was injected intravenously. Full-ring PET imaging was performed from the skull base to thigh after the radiotracer. CT data was obtained and used for attenuation correction and anatomic localization. FASTING BLOOD GLUCOSE:  Value: 98 mg/dl COMPARISON:  Multiple exams, including 05/05/2016 FINDINGS: NECK No hypermetabolic lymph nodes in the neck. Chronic left maxillary sinusitis. Lipoma along the superficial margin of the semispinalis capitis muscle and trapezius muscle, image 18/4, no significant hypermetabolic activity. CHEST The hypermetabolic right upper lobe mass measures 2.4 cm transversely by my measurement (formerly the same when surrounding postobstructive atelectasis is not included (and has a maximum SUV of 9.2 (formerly 10.2). A nodule presumed to be in the right middle lobe along the minor fissure measures 1.4 cm in transverse dimension (formerly the same) and has a maximum standard uptake value of 6.7 (formerly 5.7). There is abnormal accentuated pleural activity associated with the small but suspected exudative right pleural effusion, suspicious for pleural malignancy on the right. This is a new finding compared to the prior exam. Activity along the right inferior pleural surface and adjacent lung has a maximum SUV of 7.7. Small mediastinal lymph nodes are nevertheless hypermetabolic. For example a subcarinal node measuring 7 mm in short axis on image 98/4 (Previously 7 mm) has a maximum SUV of 6.0 (formerly 6.2). Other small hypermetabolic lymph nodes are present. Right-sided airway thickening and perihilar atelectasis/nodularity noted. ABDOMEN/PELVIS No abnormal hypermetabolic activity within the liver, pancreas, adrenal glands, or spleen. No hypermetabolic lymph nodes in the abdomen or pelvis. Considerable segmental and focal physiologic activity in  bowel, this can lower detection of bowel lesions. Cholelithiasis. SKELETON Significant increase in osseous metastatic tumor burden with new and increased hypermetabolic osseous observed in the spine, ribs, sternum, right proximal humerus, right scapula, right clavicle, bony pelvis,  and proximal femurs. For example, a new right medial femoral neck lesion has a maximum SUV of 9.2. Most of the older lesions are sclerotic, many of the newer lesions are not readily visible on the CT data. IMPRESSION: 1. Worsening metastatic disease, with a significant increase in osseous metastatic tumor burden and innumerable new bony lesions, as well as new abnormal accentuated activity along the right pleural effusion and adjacent lung suspicious for pleural metastatic disease. For the most part the lung nodules remain similarly hypermetabolic and similar in size, and the small hypermetabolic thoracic lymph nodes are likewise similar to prior. 2. Other imaging findings of potential clinical significance: Cholelithiasis. Chronic left maxillary sinusitis. Electronically Signed   By: Van Clines M.D.   On: 07/08/2016 12:32     ASSESSMENT & PLAN:  Cancer of upper lobe of right lung ([place]) Metastatic stage IV adenocarcinoma the lung- currently on Avastin and Alimta maintenance therapy status post 3 cycles. PET scan dated 07/08/2016- shows stable lung nodules however increasing right pleural surface activity and also significant worsening of metastatic burden with innumerable bony lesions- new and worsening lesions. CEA up to 850.  # I discussed with the patient and wife regarding my concerns of progressive disease based on imaging/rising CEA. I recommend discontinuation of current therapy.  # I recommend Taxotere- Cyramza- every 3 weeks. Discussed the potential side effects including but not limited to-increasing fatigue, nausea vomiting, diarrhea, hair loss, sores in the mouth, increase risk of infection and also  neuropathy. Recommend dexamethasone prior to chemotherapy.   # Bone metastases- significant worsening noted in the PET scan [see discussion above]. continue x-geva. However today calcium is 8.3; recommend holding Xgeva for now.  # pain control- currently stable on oxycodone every 6-8 hours. However if worse recommend adding fentanyl patch.  # shingles-scabbing. No evidence of any active infection. Status post valacyclovir. No post herpetic neuralgia.  # hypocalemia-8.3 calcium; from Xgeva. Recommend adding vitamin D 1000 units every day.  # CKD/AKI- creat 2.3 [creat 1.5-.1.6]; recommend increased by mouth fluid intake.   # I discussed the goal of chemotherapy being palliative/ to help improve symptoms/help live longer; unfortunately treatments are not curative.  # I reviewed the blood work- with the patient in detail; also reviewed the imaging independently [as summarized above]; and with the patient in detail.    Orders Placed This Encounter  Procedures  . CBC with Differential/Platelet    Standing Status:   Future    Standing Expiration Date:   07/09/2017  . Comprehensive metabolic panel    Standing Status:   Future    Standing Expiration Date:   07/09/2017  . Urinalysis, Complete w Microscopic    Standing Status:   Future    Standing Expiration Date:   08/08/2016   All questions were answered. The patient knows to call the clinic with any problems, questions or concerns.      [name], MD 07/09/2016 4:52 PM

## 2016-07-09 NOTE — Assessment & Plan Note (Addendum)
Metastatic stage IV adenocarcinoma the lung- currently on Avastin and Alimta maintenance therapy status post 3 cycles. PET scan dated 07/08/2016- shows stable lung nodules however increasing right pleural surface activity and also significant worsening of metastatic burden with innumerable bony lesions- new and worsening lesions. CEA up to 850.  # I discussed with the patient and wife regarding my concerns of progressive disease based on imaging/rising CEA. I recommend discontinuation of current therapy.  # I recommend Taxotere- Cyramza- every 3 weeks. Discussed the potential side effects including but not limited to-increasing fatigue, nausea vomiting, diarrhea, hair loss, sores in the mouth, increase risk of infection and also neuropathy. Recommend dexamethasone prior to chemotherapy.   # Bone metastases- significant worsening noted in the PET scan [see discussion above]. continue x-geva. However today calcium is 8.3; recommend holding Xgeva for now.  # pain control- currently stable on oxycodone every 6-8 hours. However if worse recommend adding fentanyl patch.  # shingles-scabbing. No evidence of any active infection. Status post valacyclovir. No post herpetic neuralgia.  # hypocalemia-8.3 calcium; from Xgeva. Recommend adding vitamin D 1000 units every day.  # CKD/AKI- creat 2.3 [creat 1.5-.1.6]; recommend increased by mouth fluid intake.   # I discussed the goal of chemotherapy being palliative/ to help improve symptoms/help live longer; unfortunately treatments are not curative.  # I reviewed the blood work- with the patient in detail; also reviewed the imaging independently [as summarized above]; and with the patient in detail.

## 2016-07-09 NOTE — Progress Notes (Signed)
Patient here today for follow up.  Patient states no new concerns today  

## 2016-07-09 NOTE — Progress Notes (Signed)
DISCONTINUE ON PATHWAY REGIMEN - Non-Small Cell Lung     A cycle is every 21 days:     Carboplatin        Dose Mod: None     Pemetrexed        Dose Mod: None     Bevacizumab        Dose Mod: None  **Always confirm dose/schedule in your pharmacy ordering system**    REASON: Disease Progression PRIOR TREATMENT: [id number]: Carboplatin AUC=5 + Pemetrexed 500 mg/m2 + Bevacizumab 15 mg/kg q21 Days x 4 Cycles TREATMENT RESPONSE: Progressive Disease (PD)  START ON PATHWAY REGIMEN - Non-Small Cell Lung     A cycle is every 21 days:     Ramucirumab      Docetaxel   **Always confirm dose/schedule in your pharmacy ordering system**    Patient Characteristics: Stage IV Metastatic, Non Squamous, Third Line - Chemotherapy/Immunotherapy, PS = 0, 1, Prior PD-1/PD-L1 Inhibitor or No Prior PD-1/PD-L1 Inhibitor and Not a Candidate for Immunotherapy AJCC T Category: TX Current Disease Status: Distant Metastases AJCC N Category: NX AJCC M Category: M1c AJCC 8 Stage Grouping: IVB Histology: Non Squamous Cell ROS1 Rearrangement Status: Negative T790M Mutation Status: Not Applicable - EGFR Mutation Negative/Unknown Other Mutations/Biomarkers: No Other Actionable Mutations PD-L1 Expression Status: PD-L1 Negative Chemotherapy/Immunotherapy LOT: Third Line Chemotherapy/Immunotherapy Molecular Targeted Therapy: Not Appropriate ALK Translocation Status: Negative Would you be surprised if this patient died  in the next year? I would be surprised if this patient died in the next year EGFR Mutation Status: Negative/Wild Type BRAF [id number] Mutation Status: Did Not Order Test Performance Status: PS = 0, 1 Immunotherapy Candidate Status: Not a Candidate for Immunotherapy Prior Immunotherapy Status: Prior PD-1/PD-L1 Inhibitor  Intent of Therapy: Non-Curative / Palliative Intent, Discussed with Patient

## 2016-07-14 NOTE — Progress Notes (Signed)
* [facility]     Assessment and Plan:  Metastatic lung cancer. 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 --Most recent PET scan from PET scan on 07/08/16 showed increase in pleural involvement  And numerous bony mets.   Malignant pleural effusion. -Status post right-sided Pleurx catheter, removed on 09/18/2015  Debility/Deconditioning.  -Patient has been very inactive, he is encouraged to increase his activity level at a slow pace, and continue to increase with a goal of being active for approximately 30 minutes a day. -His oxygen saturation was borderline low but stable. When I ambulated him in the office today, I advised that he can ambulate without the oxygen, but should take the oxygen with him when leaving the house.  Cough --Continue delsym as needed for cough.    Date: 07/14/2016  MRN# [id number] Stuart Spence 05-21-1960   Stuart Spence is a 56 y.o. old male seen in follow up for chief complaint of  Chief Complaint  Patient presents with  . Follow-up    pt states breathing is baseline. pt reports of sob with exertion, prod with grey mucus & R sided pain under armpit area.     HPI:   Patient is a 56 year old male who presented to the hospital in May of 2017 with cough and respiratory failure. He was found to have large right pleural effusion with total right lung atelectasis. He underwent thoracentesis 2, which was positive for adenocarcinoma, he had a Pleurx catheter placed by thoracic surgery, and was later removed. He had multiple osseous metastases. Currently he appears to have had progression his lung cancer.  On my personal review of his CT/PET 07/08/16, there appears to be right pleural effusion vs. Thickening, per report there are numerous new osseous lesions.   He is currently on 2L of oxygen at night and with exertion outside the house. He does not have it with him right  now, and usually keeps it in the car. He still feels that his breathing is doing fairly well.  He has an albuterol inhaler but he has not had to use. He goes to [employer] and no longer using motorized cart.  He feels that his breathing is doing well, he is able to walk around without too much dyspnea. His grand daughter is present today and notes that he has been more depressed since hearing that his cancer is worse.   He has been having a cough and is taking delsym for the cough and notes that it seems to work ok. He has been tried on tessalon but they did not work well.   Medication:   Outpatient Encounter Prescriptions as of 07/16/2016  Medication Sig  . albuterol (PROVENTIL HFA;VENTOLIN HFA) 108 (90 Base) MCG/ACT inhaler Inhale 1-2 puffs into the lungs See admin instructions. Reported on 09/12/2015  . amLODipine (NORVASC) 5 MG tablet Take 1 tablet (5 mg total) by mouth daily.  . Calcium Carbonate (CALCIUM 600 PO) Take 1,200 mg by mouth 2 (two) times daily.  . colchicine 0.6 MG tablet Take 0.6 mg by mouth daily as needed (two pills by mouth at onset, the one pill one hour later, maximum three pills per gout flare).  Marland Kitchen dexamethasone (DECADRON) 4 MG tablet TAKE ONE TABLET BY MOUTH TWICE DAILY THE DAY BEFORE CHEMO, AND THE DAY AFTER CHEMO WITH EACH REGIMEN OF CARBO/ALIMTA  . Dextromethorphan Polistirex (DELSYM PO) Take by mouth.  Marland Kitchen FLUoxetine (PROZAC) 20 MG capsule Take 2 capsules (  40 mg total) by mouth every morning.  . folic acid (FOLVITE) 1 MG tablet Take 1 tablet (1 mg total) by mouth daily.  Marland Kitchen lidocaine-prilocaine (EMLA) cream Apply 1 application topically as needed.  . nystatin (NYSTATIN) powder Apply topically 3 (three) times daily as needed.  . ondansetron (ZOFRAN) 8 MG tablet Take 1 tablet (8 mg total) by mouth every 8 (eight) hours as needed for nausea or vomiting.  Marland Kitchen oxyCODONE (OXY IR/ROXICODONE) 5 MG immediate release tablet 1 tablet every 4-6 hours as need for pain  . OXYGEN Inhale 2 L  into the lungs continuous. Reported on 09/12/2015  . pantoprazole (PROTONIX) 40 MG tablet Take 1 tablet (40 mg total) by mouth daily.  . sildenafil (VIAGRA) 100 MG tablet Take 1 tablet (100 mg total) by mouth daily as needed for erectile dysfunction.  Marland Kitchen telmisartan (MICARDIS) 40 MG tablet Take 1 tablet (40 mg total) by mouth daily.   No facility-administered encounter medications on file as of 07/16/2016.      Allergies:  Patient has no known allergies.  Review of Systems: Gen:  Denies  fever, sweats. HEENT: Denies blurred vision. Cvc:  No dizziness, chest pain or heaviness Resp:   Denies cough or sputum porduction. Gi: Denies swallowing difficulty, stomach pain.  Gu:  Denies bladder incontinence, burning urine Ext:   No Joint pain, stiffness. Skin: No skin rash, easy bruising. Endoc:  No polyuria, polydipsia. Psych: No depression, insomnia. Other:  All other systems were reviewed and found to be negative other than what is mentioned in the HPI.   Physical Examination:   VS: BP 126/72 (BP Location: Left Arm, Cuff Size: Normal)   Pulse 99   Ht '5\' 6"'$  (1.676 m)   Wt 229 lb 12.8 oz (104.2 kg)   SpO2 98%   BMI 37.09 kg/m   General Appearance: No distress  Neuro:without focal findings,   HEENT: PERRLA, EOM intact. Pulmonary: normal breath sounds, No wheezing.   CardiovascularNormal S1,S2.  No m/r/g.   Abdomen: Benign, Soft, non-tender. Renal:  No costovertebral tenderness  GU:  Not performed at this time. Endoc: No evident thyromegaly, no signs of acromegaly. Skin:   warm, no rash. Extremities: normal, no cyanosis, clubbing.   LABORATORY PANEL:   CBC  Recent Labs Lab 07/08/16 0831  WBC 12.1*  HGB 11.8*  HCT 34.3*  PLT 215   ------------------------------------------------------------------------------------------------------------------  Chemistries   Recent Labs Lab 07/08/16 0831  NA 135  K 5.0  CL 100*  CO2 28  GLUCOSE 105*  BUN 24*  CREATININE 2.30*    CALCIUM 8.4*  AST 22  ALT 25  ALKPHOS 133*  BILITOT 0.6   ------------------------------------------------------------------------------------------------------------------  Cardiac Enzymes No results for input(s): TROPONINI in the last 168 hours. ------------------------------------------------------------  RADIOLOGY:   No results found for this or any previous visit. Results for orders placed during the hospital encounter of 09/09/15  DG Chest 2 View   Narrative CLINICAL DATA:  History of right lung cancer. Port-A-Cath placement.  EXAM: CHEST  2 VIEW  COMPARISON:  Chest x-rays dated 08/28/2015 and 08/18/2015. Comparison also to chest CT dated 08/12/2015.  FINDINGS: Right chest wall Port-A-Cath in place with tip adequately positioned at the level of the mid SVC. No pneumothorax seen. The pleural parenchymal opacities overlying the mid and lower zones of the right lung appear stable. Stable right upper lobe mass bordering the paratracheal mediastinum. Left lung remains clear. No new lung findings.  Cardiomegaly appears stable. Overall cardiomediastinal silhouette is stable in  size and configuration. Osseous structures about the chest are unremarkable.  IMPRESSION: Stable chest x-ray. Right chest wall Port-A-Cath appears appropriately positioned with tip at the level of the mid SVC. No pneumothorax.   Electronically Signed   By: Franki Cabot M.D.   On: 09/09/2015 13:19    ------------------------------------------------------------------------------------------------------------------  Thank  you for allowing [facility] to assist in the care of your patient. Our recommendations are noted above.  Please contact us if we can be of further service.   Marda Stalker, MD.   Pulmonary and Critical Care Office Number: [phone]  Patricia Pesa, M.D.  Vilinda Boehringer, M.D.  Merton Border, M.D  07/14/2016

## 2016-07-16 ENCOUNTER — Ambulatory Visit (INDEPENDENT_AMBULATORY_CARE_PROVIDER_SITE_OTHER): Payer: No Typology Code available for payment source | Admitting: Internal Medicine

## 2016-07-16 ENCOUNTER — Encounter: Payer: Self-pay | Admitting: Internal Medicine

## 2016-07-16 VITALS — BP 126/72 | HR 99 | Ht 66.0 in | Wt 229.8 lb

## 2016-07-16 DIAGNOSIS — R05 Cough: Secondary | ICD-10-CM | POA: Diagnosis not present

## 2016-07-16 DIAGNOSIS — R059 Cough, unspecified: Secondary | ICD-10-CM

## 2016-07-16 DIAGNOSIS — C3490 Malignant neoplasm of unspecified part of unspecified bronchus or lung: Secondary | ICD-10-CM | POA: Diagnosis not present

## 2016-07-16 DIAGNOSIS — R0602 Shortness of breath: Secondary | ICD-10-CM

## 2016-07-16 NOTE — Patient Instructions (Signed)
--  Continue delsym as needed for cough.   --Continue to try to be active as possible.

## 2016-07-18 ENCOUNTER — Other Ambulatory Visit: Payer: Self-pay | Admitting: Hematology and Oncology

## 2016-07-18 DIAGNOSIS — C3491 Malignant neoplasm of unspecified part of right bronchus or lung: Secondary | ICD-10-CM

## 2016-07-18 DIAGNOSIS — C3411 Malignant neoplasm of upper lobe, right bronchus or lung: Secondary | ICD-10-CM

## 2016-07-18 MED ORDER — DEXAMETHASONE 4 MG PO TABS: 8.0000 mg | ORAL_TABLET | Freq: Two times a day (BID) | ORAL | 1 refills | Status: DC

## 2016-07-19 ENCOUNTER — Encounter: Payer: Self-pay | Admitting: Hematology and Oncology

## 2016-07-19 ENCOUNTER — Inpatient Hospital Stay: Payer: No Typology Code available for payment source

## 2016-07-19 ENCOUNTER — Inpatient Hospital Stay (HOSPITAL_BASED_OUTPATIENT_CLINIC_OR_DEPARTMENT_OTHER): Payer: No Typology Code available for payment source | Admitting: Hematology and Oncology

## 2016-07-19 VITALS — BP 139/85 | HR 86 | Temp 95.7°F | Resp 18

## 2016-07-19 VITALS — BP 124/81 | HR 90 | Temp 95.2°F | Resp 18 | Wt 233.6 lb

## 2016-07-19 DIAGNOSIS — J90 Pleural effusion, not elsewhere classified: Secondary | ICD-10-CM

## 2016-07-19 DIAGNOSIS — Z7952 Long term (current) use of systemic steroids: Secondary | ICD-10-CM

## 2016-07-19 DIAGNOSIS — Z79899 Other long term (current) drug therapy: Secondary | ICD-10-CM

## 2016-07-19 DIAGNOSIS — C771 Secondary and unspecified malignant neoplasm of intrathoracic lymph nodes: Secondary | ICD-10-CM | POA: Diagnosis not present

## 2016-07-19 DIAGNOSIS — C3411 Malignant neoplasm of upper lobe, right bronchus or lung: Secondary | ICD-10-CM

## 2016-07-19 DIAGNOSIS — D649 Anemia, unspecified: Secondary | ICD-10-CM

## 2016-07-19 DIAGNOSIS — Z77098 Contact with and (suspected) exposure to other hazardous, chiefly nonmedicinal, chemicals: Secondary | ICD-10-CM

## 2016-07-19 DIAGNOSIS — C3491 Malignant neoplasm of unspecified part of right bronchus or lung: Secondary | ICD-10-CM

## 2016-07-19 DIAGNOSIS — Z5111 Encounter for antineoplastic chemotherapy: Secondary | ICD-10-CM

## 2016-07-19 DIAGNOSIS — K921 Melena: Secondary | ICD-10-CM

## 2016-07-19 DIAGNOSIS — R634 Abnormal weight loss: Secondary | ICD-10-CM | POA: Diagnosis not present

## 2016-07-19 DIAGNOSIS — Z9981 Dependence on supplemental oxygen: Secondary | ICD-10-CM

## 2016-07-19 DIAGNOSIS — C7951 Secondary malignant neoplasm of bone: Secondary | ICD-10-CM

## 2016-07-19 DIAGNOSIS — R970 Elevated carcinoembryonic antigen [CEA]: Secondary | ICD-10-CM

## 2016-07-19 DIAGNOSIS — Z801 Family history of malignant neoplasm of trachea, bronchus and lung: Secondary | ICD-10-CM

## 2016-07-19 DIAGNOSIS — N289 Disorder of kidney and ureter, unspecified: Secondary | ICD-10-CM

## 2016-07-19 DIAGNOSIS — Z7189 Other specified counseling: Secondary | ICD-10-CM

## 2016-07-19 DIAGNOSIS — I10 Essential (primary) hypertension: Secondary | ICD-10-CM | POA: Diagnosis not present

## 2016-07-19 LAB — IRON AND TIBC
Iron: 106 ug/dL (ref 45–182)
Saturation Ratios: 40 % — ABNORMAL HIGH (ref 17.9–39.5)
TIBC: 265 ug/dL (ref 250–450)
UIBC: 159 ug/dL

## 2016-07-19 LAB — COMPREHENSIVE METABOLIC PANEL
ALBUMIN: 3.3 g/dL — AB (ref 3.5–5.0)
ALT: 15 U/L — AB (ref 17–63)
AST: 23 U/L (ref 15–41)
Alkaline Phosphatase: 106 U/L (ref 38–126)
Anion gap: 6 (ref 5–15)
BUN: 29 mg/dL — AB (ref 6–20)
CHLORIDE: 103 mmol/L (ref 101–111)
CO2: 25 mmol/L (ref 22–32)
CREATININE: 2.16 mg/dL — AB (ref 0.61–1.24)
Calcium: 9.1 mg/dL (ref 8.9–10.3)
GFR calc Af Amer: 38 mL/min — ABNORMAL LOW (ref >60)
GFR calc non Af Amer: 33 mL/min — ABNORMAL LOW (ref >60)
Glucose, Bld: 152 mg/dL — ABNORMAL HIGH (ref 65–99)
Potassium: 4.8 mmol/L (ref 3.5–5.1)
SODIUM: 134 mmol/L — AB (ref 135–145)
Total Bilirubin: 0.5 mg/dL (ref 0.3–1.2)
Total Protein: 7.6 g/dL (ref 6.5–8.1)

## 2016-07-19 LAB — URINALYSIS, COMPLETE (UACMP) WITH MICROSCOPIC
Bilirubin Urine: NEGATIVE
Glucose, UA: NEGATIVE mg/dL
HGB URINE DIPSTICK: NEGATIVE
Ketones, ur: NEGATIVE mg/dL
Leukocytes, UA: NEGATIVE
NITRITE: NEGATIVE
PROTEIN: NEGATIVE mg/dL
Specific Gravity, Urine: 1.011 (ref 1.005–1.030)
pH: 5 (ref 5.0–8.0)

## 2016-07-19 LAB — CBC WITH DIFFERENTIAL/PLATELET
BASOS ABS: 0 10*3/uL (ref 0–0.1)
Basophils Relative: 0 %
EOS ABS: 0 10*3/uL (ref 0–0.7)
Eosinophils Relative: 0 %
HCT: 30.7 % — ABNORMAL LOW (ref 40.0–52.0)
Hemoglobin: 10.5 g/dL — ABNORMAL LOW (ref 13.0–18.0)
LYMPHS PCT: 6 %
Lymphs Abs: 0.8 10*3/uL — ABNORMAL LOW (ref 1.0–3.6)
MCH: 36 pg — ABNORMAL HIGH (ref 26.0–34.0)
MCHC: 34.2 g/dL (ref 32.0–36.0)
MCV: 105.2 fL — ABNORMAL HIGH (ref 80.0–100.0)
Monocytes Absolute: 0.5 10*3/uL (ref 0.2–1.0)
Monocytes Relative: 4 %
Neutro Abs: 12.5 10*3/uL — ABNORMAL HIGH (ref 1.4–6.5)
Neutrophils Relative %: 90 %
PLATELETS: 221 10*3/uL (ref 150–440)
RBC: 2.92 MIL/uL — AB (ref 4.40–5.90)
RDW: 16.6 % — ABNORMAL HIGH (ref 11.5–14.5)
WBC: 13.9 10*3/uL — AB (ref 3.8–10.6)

## 2016-07-19 LAB — FERRITIN: Ferritin: 1116 ng/mL — ABNORMAL HIGH (ref 24–336)

## 2016-07-19 MED ORDER — DOCETAXEL CHEMO INJECTION 160 MG/16ML
75.0000 mg/m2 | Freq: Once | INTRAVENOUS | Status: AC
  Administered 2016-07-19: 170 mg via INTRAVENOUS
  Filled 2016-07-19: qty 17

## 2016-07-19 MED ORDER — PEGFILGRASTIM 6 MG/0.6ML ~~LOC~~ PSKT
6.0000 mg | PREFILLED_SYRINGE | Freq: Once | SUBCUTANEOUS | Status: AC
  Administered 2016-07-19: 6 mg via SUBCUTANEOUS
  Filled 2016-07-19: qty 0.6

## 2016-07-19 MED ORDER — SODIUM CHLORIDE 0.9 % IV SOLN
Freq: Once | INTRAVENOUS | Status: AC
  Administered 2016-07-19: 09:00:00 via INTRAVENOUS
  Filled 2016-07-19: qty 1000

## 2016-07-19 MED ORDER — DEXAMETHASONE SODIUM PHOSPHATE 10 MG/ML IJ SOLN
10.0000 mg | Freq: Once | INTRAMUSCULAR | Status: AC
  Administered 2016-07-19: 10 mg via INTRAVENOUS
  Filled 2016-07-19: qty 1

## 2016-07-19 MED ORDER — HEPARIN SOD (PORK) LOCK FLUSH 100 UNIT/ML IV SOLN
500.0000 [IU] | Freq: Once | INTRAVENOUS | Status: AC | PRN
  Administered 2016-07-19: 500 [IU]
  Filled 2016-07-19: qty 5

## 2016-07-19 MED ORDER — SODIUM CHLORIDE 0.9% FLUSH
10.0000 mL | INTRAVENOUS | Status: DC | PRN
  Administered 2016-07-19: 10 mL
  Filled 2016-07-19: qty 10

## 2016-07-19 MED ORDER — SODIUM CHLORIDE 0.9 % IV SOLN: 10.0000 mg | Freq: Once | INTRAVENOUS | Status: DC

## 2016-07-19 NOTE — Progress Notes (Signed)
Patient offers no complaints today. 

## 2016-07-19 NOTE — Progress Notes (Signed)
Creatinine noted to be 2.16 today.  Okay to proceed with treatment per Tracie per Dr. Danella Deis.  Cyramza to be held today per Dr. Mike Gip.  Of note, pt prefers to have neulasta on pro today instead of coming tomorrow for a neulasta injection.  Okay per Dr. Mike Gip.

## 2016-07-19 NOTE — Progress Notes (Signed)
[facility] day:  07/19/2016   Chief Complaint: Stuart Spence is a 56 y.o. male with metastatic lung cancer who is seen for assessment prior to cycle #1 Taxotere and ramicirumab (Cyramza).  HPI:  The patient was last seen in the medical oncology clinic on 05/28/2016.  At that time, he felt good.  He received cycle #1 maintenance Alimta and Avastin.  He saw Dr. Janese Banks in my absence on 06/18/2016.  He received cycle #2.  CEA had increased.  PET scan was scheduled.  He was admitted to [facility] from 06/23/2016 - 06/25/2016 with rectal bleeding.  His hemoglobin remained stable. He did not require any transfusions.  He was seen by gastroenterology with a plan for outpatient colonoscopy.  Bleeding was felt hemorrhoidal.  He was discharged on a PPI daily.  In addition, he had shingles on his left mid abdomen area towards his back.  He was discharged on Valtrex.    PET scan on 07/08/2016 revealed worsening metastatic disease, with a significant increase in osseous metastatic tumor burden and innumerable new bony lesions, as well as new abnormal accentuated activity along the right pleural effusion and adjacent lung suspicious for pleural metastaticdisease. For the most part the lung nodules remain similarly hypermetabolic and similar in size, and the small hypermetabolic thoracic lymph nodes are likewise similar.  He was seen by Dr. Rogue Bussing on 07/09/2016.  Discussions were held about institution of Taxotere and ramicirumab.  Delton See was held secondary to hypocalcemia.  Creatinine was 2.3.  Symptomatically, he denies any rectal bleeding.  He took his steroids yesterday in preparation for Taxotere. He denies any complaints.   Past Medical History:  Diagnosis Date  . Cancer ([place])    Lung  . Depression   . ED (erectile dysfunction)   . Gout   . Hypertension   . On home oxygen therapy   . Personal history of chemotherapy    PT TAKING CHEMO EVERY 3 WEEKS    Past  Surgical History:  Procedure Laterality Date  . CHEST TUBE INSERTION Left 08/13/2015   Procedure: CHEST TUBE INSERTION;  Surgeon: Nestor Lewandowsky, MD;  Location: [facility] ORS;  Service: General;  Laterality: Left;  . CHEST TUBE INSERTION N/A 09/18/2015   Procedure: PLEURX CATH REMOVAL;  Surgeon: Nestor Lewandowsky, MD;  Location: [facility] ORS;  Service: Thoracic;  Laterality: N/A;  . PORTACATH PLACEMENT Left 08/13/2015   Procedure: INSERTION PORT-A-CATH;  Surgeon: Nestor Lewandowsky, MD;  Location: [facility] ORS;  Service: General;  Laterality: Left;  . PORTACATH PLACEMENT    . TONSILLECTOMY      Family History  Problem Relation Age of Onset  . Cancer Mother 54    lung  . Heart attack Father 57  . Hypertension Father   . Congestive Heart Failure Father 57    died from  . Diabetes Sister     Social History:  reports that he has never smoked. He has never used smokeless tobacco. He reports that he does not drink alcohol or use drugs.  He has worked 63 years in the [employer].He notes exposure to chemicals.   He recently switched jobs.  He is not working.  He had previously planned on helping to build 2 houses.  Patient's wife name is [name].  He is alone  today.  Allergies: No Known Allergies  Current Medications: Current Outpatient Prescriptions  Medication Sig Dispense Refill  . albuterol (PROVENTIL HFA;VENTOLIN HFA) 108 (90 Base) MCG/ACT inhaler Inhale 1-2 puffs into  the lungs See admin instructions. Reported on 09/12/2015    . amLODipine (NORVASC) 5 MG tablet Take 1 tablet (5 mg total) by mouth daily. 30 tablet 12  . Calcium Carbonate (CALCIUM 600 PO) Take 1,200 mg by mouth 2 (two) times daily.    . colchicine 0.6 MG tablet Take 0.6 mg by mouth daily as needed (two pills by mouth at onset, the one pill one hour later, maximum three pills per gout flare).    Marland Kitchen dexamethasone (DECADRON) 4 MG tablet TAKE ONE TABLET BY MOUTH TWICE DAILY THE DAY BEFORE CHEMO, AND THE DAY AFTER CHEMO WITH EACH REGIMEN OF  CARBO/ALIMTA 36 tablet 0  . dexamethasone (DECADRON) 4 MG tablet Take 2 tablets (8 mg total) by mouth 2 (two) times daily. Start the day before Taxotere. Then daily after chemo for 2 days. 30 tablet 1  . Dextromethorphan Polistirex (DELSYM PO) Take by mouth.    Marland Kitchen FLUoxetine (PROZAC) 20 MG capsule Take 2 capsules (40 mg total) by mouth every morning. 60 capsule 12  . folic acid (FOLVITE) 1 MG tablet Take 1 tablet (1 mg total) by mouth daily. 30 tablet 3  . lidocaine-prilocaine (EMLA) cream Apply 1 application topically as needed. 30 g 2  . nystatin (NYSTATIN) powder Apply topically 3 (three) times daily as needed. 30 g 1  . ondansetron (ZOFRAN) 8 MG tablet Take 1 tablet (8 mg total) by mouth every 8 (eight) hours as needed for nausea or vomiting. 20 tablet 3  . oxyCODONE (OXY IR/ROXICODONE) 5 MG immediate release tablet 1 tablet every 4-6 hours as need for pain 30 tablet 0  . OXYGEN Inhale 2 L into the lungs continuous. Reported on 09/12/2015    . pantoprazole (PROTONIX) 40 MG tablet Take 1 tablet (40 mg total) by mouth daily. 30 tablet 1  . sildenafil (VIAGRA) 100 MG tablet Take 1 tablet (100 mg total) by mouth daily as needed for erectile dysfunction. 10 tablet 12  . telmisartan (MICARDIS) 40 MG tablet Take 1 tablet (40 mg total) by mouth daily. 30 tablet 12   No current facility-administered medications for this visit.     Review of Systems:  GENERAL: Feels fine. No fevers or sweats.  Weight down 7 pounds since last visit. PERFORMANCE STATUS (ECOG): 1 HEENT: No visual changes, runny nose, sore throat, mouth sores or tenderness. Lungs: No shortness of breath or cough. No hemoptysis.   Cardiac: No chest pain, palpitations, orthopnea, or PND. GI: No current nausea, vomiting, diarrhea, constipation, melena or hematochezia. Rectal bleeding, resolved.  No prior colonoscopy. GU: No urgency, frequency, dysuria, or hematuria.  Musculoskeletal: No back pain. No joint pain. No muscle  tenderness. Extremities: No pain or swelling. Skin: Singles, resolved.  No rash or ulcers. Neuro: No headache, numbness or weakness, balance or coordination issues. Endocrine: No diabetes, thyroid issues, hot flashes or night sweats. Psych: No mood changes, depression or anxiety. Pain: No focal pain. Review of systems: All other systems reviewed and found to be negative.  Physical Exam: Blood pressure (!) 145/84, pulse 93, temperature (!) 95.2 F (35.1 C), temperature source Tympanic, resp. rate 18, weight 233 lb 9 oz (105.9 kg). GENERAL: Well developed, well nourished, heavyset gentleman sitting in the exam room in no acute distress. MENTAL STATUS: Alert and oriented to person, place and time. HEAD: Shaved head. Goatee. Normocephalic, atraumatic, face symmetric, no Cushingoid features. EYES: Oval glasses. Pupils equal round and reactive to light and accomodation. No conjunctivitis or scleral icterus. ENT: Oropharynx clear without  lesion. Tongue normal. Mucous membranes moist.  RESPIRATORY: Clear to auscultation without rales, wheezes or rhonchi. CARDIOVASCULAR: Regular rate and rhythm without murmur, rub or gallop. ABDOMEN: Soft, non-tender, with active bowel sounds, and no hepatosplenomegaly. No masses. SKIN: No rashes, ulcers or skin changes. EXTREMITIES: Mild chronic lower extremity changes.  No skin discoloration or tenderness. No palpable cords. LYMPH NODES: No palpable cervical, supraclavicular, axillary or inguinal adenopathy  NEUROLOGICAL: Unremarkable. PSYCH: Appropriate.   Appointment on 07/19/2016  Component Date Value Ref Range Status  . WBC 07/19/2016 13.9* 3.8 - 10.6 K/uL Final  . RBC 07/19/2016 2.92* 4.40 - 5.90 MIL/uL Final  . Hemoglobin 07/19/2016 10.5* 13.0 - 18.0 g/dL Final  . HCT 07/19/2016 30.7* 40.0 - 52.0 % Final  . MCV 07/19/2016 105.2* 80.0 - 100.0 fL Final  . MCH 07/19/2016 36.0* 26.0 - 34.0 pg Final  . MCHC 07/19/2016 34.2  32.0  - 36.0 g/dL Final  . RDW 07/19/2016 16.6* 11.5 - 14.5 % Final  . Platelets 07/19/2016 221  150 - 440 K/uL Final  . Neutrophils Relative % 07/19/2016 90  % Final  . Neutro Abs 07/19/2016 12.5* 1.4 - 6.5 K/uL Final  . Lymphocytes Relative 07/19/2016 6  % Final  . Lymphs Abs 07/19/2016 0.8* 1.0 - 3.6 K/uL Final  . Monocytes Relative 07/19/2016 4  % Final  . Monocytes Absolute 07/19/2016 0.5  0.2 - 1.0 K/uL Final  . Eosinophils Relative 07/19/2016 0  % Final  . Eosinophils Absolute 07/19/2016 0.0  0 - 0.7 K/uL Final  . Basophils Relative 07/19/2016 0  % Final  . Basophils Absolute 07/19/2016 0.0  0 - 0.1 K/uL Final  . Sodium 07/19/2016 134* 135 - 145 mmol/L Final  . Potassium 07/19/2016 4.8  3.5 - 5.1 mmol/L Final  . Chloride 07/19/2016 103  101 - 111 mmol/L Final  . CO2 07/19/2016 25  22 - 32 mmol/L Final  . Glucose, Bld 07/19/2016 152* 65 - 99 mg/dL Final  . BUN 07/19/2016 29* 6 - 20 mg/dL Final  . Creatinine, Ser 07/19/2016 2.16* 0.61 - 1.24 mg/dL Final  . Calcium 07/19/2016 9.1  8.9 - 10.3 mg/dL Final  . Total Protein 07/19/2016 7.6  6.5 - 8.1 g/dL Final  . Albumin 07/19/2016 3.3* 3.5 - 5.0 g/dL Final  . AST 07/19/2016 23  15 - 41 U/L Final  . ALT 07/19/2016 15* 17 - 63 U/L Final  . Alkaline Phosphatase 07/19/2016 106  38 - 126 U/L Final  . Total Bilirubin 07/19/2016 0.5  0.3 - 1.2 mg/dL Final  . GFR calc non Af Amer 07/19/2016 33* >60 mL/min Final  . GFR calc Af Amer 07/19/2016 38* >60 mL/min Final   Comment: (NOTE) The eGFR has been calculated using the CKD EPI equation. This calculation has not been validated in all clinical situations. eGFR's persistently <60 mL/min signify possible Chronic Kidney Disease.   . Anion gap 07/19/2016 6  5 - 15 Final  . Color, Urine 07/19/2016 YELLOW* YELLOW Final  . APPearance 07/19/2016 CLEAR* CLEAR Final  . Specific Gravity, Urine 07/19/2016 1.011  1.005 - 1.030 Final  . pH 07/19/2016 5.0  5.0 - 8.0 Final  . Glucose, UA 07/19/2016 NEGATIVE   NEGATIVE mg/dL Final  . Hgb urine dipstick 07/19/2016 NEGATIVE  NEGATIVE Final  . Bilirubin Urine 07/19/2016 NEGATIVE  NEGATIVE Final  . Ketones, ur 07/19/2016 NEGATIVE  NEGATIVE mg/dL Final  . Protein, ur 07/19/2016 NEGATIVE  NEGATIVE mg/dL Final  . Nitrite 07/19/2016 NEGATIVE  NEGATIVE Final  .  Leukocytes, UA 07/19/2016 NEGATIVE  NEGATIVE Final  . RBC / HPF 07/19/2016 0-5  0 - 5 RBC/hpf Final  . WBC, UA 07/19/2016 0-5  0 - 5 WBC/hpf Final  . Bacteria, UA 07/19/2016 RARE* NONE SEEN Final  . Squamous Epithelial / LPF 07/19/2016 0-5* NONE SEEN Final  . Mucous 07/19/2016 PRESENT   Final    Assessment:  Stuart Spence is a 56 y.o. male with stage IV adenocarcinoma of the right lung.  He has no smoking history.  He presented with with a large right sided pleural effusion and an ill-defined 4.5 x 4.5 cm mass in the right upper lobe. Thoracentesis x 2 of approximately 4.7 liters of blood fluid revealed adenocarcinoma. TTF-1 and Napsin A were immunoreactive and consistent with a lung primary. Pleur-X catheter was placed on 08/13/2015 (removed 09/18/2015).  [facility] testing on 08/18/2015 was negative for:  EGFR, ALK, ROS1 and RET.  Foundation One testing revealed ERBB2 amplification equivocal, RICTOR amplification, APC [id number], CDKN2A/B loss, microsatellite stable, tumor mutation burden intermediate (6 Muts/Mb).  EGFR, K-RAS, ALK, BRAF, MET and RET were negative.  Chest CT angiogram on 08/10/2015 revealed and ill-defined 4.5 x 4.5 cm area of hypodensity in the right upper lobe with small areas of air bronchogram. This was incompletely characterized but is concerning for a centrally obstructing mass. There was complete occlusion of the right upper, right middle, and right lower lobe bronchi with complete collapse of the right lung. There was a large right pleural effusion. There was a lytic lesion involving the right fourth rib compatible with metastatic disease. There was no CT evidence of  pulmonary embolism.  PET scan on 08/20/2015 revealed a hypermetabolic 5.2 cm medial right upper lobe lung mass, consistent with primary bronchogenic carcinoma, with a broad attachment to the medial right upper lobe pleura.  There was interlobular septal thickening throughout the right lung suggested a component of lymphangitic tumor.  There was a malignant small right pleural effusion with hypermetabolism throughout the right pleural space.  There was hypermetabolic ipsilateral hilar, subcarinal and ipsilateral mediastinal nodal metastases.  There was extensive hypermetabolic lytic osseous metastases throughout the axial and proximal appendicular skeleton.    Regarding the skeleton, there were numerous hypermetabolic faintly lytic osseous metastases in the left humeral head (SUV 6.8), right acromion, bilateral ribs most prominent in the anterior right fourth rib  (SUV 16.0), thoracolumbar spine (T11 vertebral body with SUV 9.4), bilateral iliac bones (medial right iliac bone with SUV 12.1 and left anterior acetabulum with SUV 10.0), and right proximal femur (SUV 10.0 in the region of the right lesser trochanter).   Plain films on 08/21/2015 revealed a left humerus lytic lesion with some thinning and some absence of the cortical margin.  He received 800 cGy to the left humerus on 09/17/2015.  CEA was 837.1 on 09/12/2015, 448 on 10/10/2015, 346 on 10/31/2015, 261.3 on 11/21/2015, 726.2 on 01/02/2016, 857.2 on 01/09/2016, 1458.0 on 01/23/2016, 1350.0 on 01/30/2016, 999.6 on 02/13/2016, 870.8 on 03/04/2016, 671.9 on 03/25/2016, 587.6 on 04/16/2016, 510.6 on 05/07/2016, 512.5 on 05/27/2016, 504.1 on 05/28/2016, 596.3 on 06/17/2016, and 853.1 on 07/08/2016.  Anemia work-up on 09/04/2015 revealed a ferritin (519), iron saturation (18%), B12 (3372), and folate (21.9).  He received 4 cycles of Keytruda, carboplatin and Alimta (08/22/2015 - 10/31/2015).   He received 3 cycles of single agent Keytruda (11/21/2015 -  01/02/2016).  Pleur-X catheter was removed on 09/18/2015.    He received 6 additional cycles of carboplatin and Alimta (01/23/2016 -  05/07/2016).  He has received 4 cycles of Avastin (03/05/2016 - 05/07/2016).    He received 2 cycles of maintenance Alimta and Avastin (05/28/2016 - 06/18/2016).  He received B12 every 9 weeks (last 06/18/2016).  He receives monthly Xgeva (began 08/22/2015; last 05/28/2016).   PET scan on 11/20/2015 revealed a marked positive response to therapy.  There was decrease in size and metabolic activity of RIGHT perihilar mass, resolution of mediastinal nodal metabolic activity, and marked decrease in size and metabolic activity of RIGHT anterior chest wall metastasis.  There was decreased in metabolic activity of multiple skeletal metastasis.    PET scan on 01/16/2016 revealed a marked interval progression of disease compared with previous exam.  There was progression of multifocal hypermetabolic bone metastases.  There was the interval enlargement and right lung perihilar mass with development of pleural spread of tumor and lymphangitic spread of tumor within the right lung.  There was new hypermetabolic and enlarged sub-carinal lymph node.  PET scan on 05/05/2016 revealed a positive response to therapy with some regression of the right upper lobe mass, and decreasing lymphangitic spread of disease in the right lung, decreasing malignant right pleural effusion, and a mixed response of osseous lesions (more regression than progression).  A lesion in the posterior aspect of T11 measured 3.0 x 3.4 cm (previously 2.2 x 2.4 cm), but was no longer hypermetabolic.  The largest persistent lesion hypermetabolic lesions are in the pelvis in the posterior aspect of the left ilium and in the right side of the sacrum, are more lytic and sclerotic in appearance, with the largest of these lesions in the right side of the sacrum measuring up to 2.7 cm (SUV 10.5).  PET scan on 07/08/2016  revealed worsening metastatic disease, with a significant increase in osseous metastatic tumor burden and innumerable new bony lesions, as well.  There was increased activity along the right pleural effusion and adjacent lung suspicious for pleural metastatic disease. The lung nodules and thoracic lymph nodes remained similarly hypermetabolic and similar in size.  He was admitted to [facility] from 06/23/2016 - 06/25/2016 with rectal bleeding. Bleeding was felt hemorrhoidal.  He had shingles on his left mid abdomen area towards his back.  He was discharged on Valtrex.   He has a macrocytic anemia.  Etiology is likely multi-factorial secondary to chronic disease, rectal bleeding, and renal insufficiency.  He has renal insufficiency of unclear etiology.  Creatinine is 2.16 (CrCl 33 ml/min).  Symptomatically, he denies any rectal bleeding or shortness of breath or cough.  Exam is stable.  Plan: 1.  Labs today:  CBC with diff, CMP, Mg, CEA. 2.  Add ferritin and iron studies. 3.  Cycle #1 Taxotere today 4.  Discuss holding Cyramza secondary to rectal bleeding of unclear etiology.  Will readdress with next cycle. 5.  RTC tomorrow for Neulasta 6.  Renal ultrasound 7.  Consult nephrology- renal insufficiency 8.  RTC in 10 days for MD assessment, labs (CBC with diff, BMP) and +/- Xgeva 9.  RTC in 3 weeks for MD assessment, labs (CBC with diff, CMP, Mg, CEA), cycle #2 Taxotere and +/- Cyramza   Lequita Asal, MD  07/19/2016, 8:51 AM

## 2016-07-20 ENCOUNTER — Inpatient Hospital Stay: Payer: No Typology Code available for payment source

## 2016-07-20 LAB — CEA: CEA: 932.1 ng/mL — ABNORMAL HIGH (ref 0.0–4.7)

## 2016-07-23 ENCOUNTER — Encounter: Payer: Self-pay | Admitting: Emergency Medicine

## 2016-07-23 ENCOUNTER — Other Ambulatory Visit: Payer: Self-pay

## 2016-07-23 ENCOUNTER — Emergency Department
Admission: EM | Admit: 2016-07-23 | Discharge: 2016-07-23 | Disposition: A | Discharge status: Home / Self Care | Payer: No Typology Code available for payment source | Attending: Emergency Medicine | Admitting: Emergency Medicine

## 2016-07-23 DIAGNOSIS — Z79899 Other long term (current) drug therapy: Secondary | ICD-10-CM | POA: Diagnosis not present

## 2016-07-23 DIAGNOSIS — I10 Essential (primary) hypertension: Secondary | ICD-10-CM | POA: Insufficient documentation

## 2016-07-23 DIAGNOSIS — K625 Hemorrhage of anus and rectum: Secondary | ICD-10-CM | POA: Diagnosis present

## 2016-07-23 DIAGNOSIS — Z85118 Personal history of other malignant neoplasm of bronchus and lung: Secondary | ICD-10-CM | POA: Insufficient documentation

## 2016-07-23 LAB — CBC WITH DIFFERENTIAL/PLATELET
Basophils Absolute: 0.1 10*3/uL (ref 0–0.1)
Basophils Relative: 0 %
EOS ABS: 0 10*3/uL (ref 0–0.7)
EOS PCT: 0 %
HCT: 33.9 % — ABNORMAL LOW (ref 40.0–52.0)
Hemoglobin: 11.4 g/dL — ABNORMAL LOW (ref 13.0–18.0)
LYMPHS ABS: 1.1 10*3/uL (ref 1.0–3.6)
LYMPHS PCT: 6 %
MCH: 35.3 pg — AB (ref 26.0–34.0)
MCHC: 33.6 g/dL (ref 32.0–36.0)
MCV: 105 fL — ABNORMAL HIGH (ref 80.0–100.0)
MONO ABS: 0.3 10*3/uL (ref 0.2–1.0)
MONOS PCT: 1 %
Neutro Abs: 16.3 10*3/uL — ABNORMAL HIGH (ref 1.4–6.5)
Neutrophils Relative %: 93 %
PLATELETS: 135 10*3/uL — AB (ref 150–440)
RBC: 3.23 MIL/uL — ABNORMAL LOW (ref 4.40–5.90)
RDW: 16.6 % — ABNORMAL HIGH (ref 11.5–14.5)
WBC: 17.7 10*3/uL — ABNORMAL HIGH (ref 3.8–10.6)

## 2016-07-23 LAB — TYPE AND SCREEN
ABO/RH(D): B POS
ANTIBODY SCREEN: NEGATIVE

## 2016-07-23 LAB — COMPREHENSIVE METABOLIC PANEL
ALBUMIN: 3.7 g/dL (ref 3.5–5.0)
ALT: 21 U/L (ref 17–63)
ANION GAP: 11 (ref 5–15)
AST: 30 U/L (ref 15–41)
Alkaline Phosphatase: 139 U/L — ABNORMAL HIGH (ref 38–126)
BILIRUBIN TOTAL: 0.7 mg/dL (ref 0.3–1.2)
BUN: 38 mg/dL — ABNORMAL HIGH (ref 6–20)
CO2: 24 mmol/L (ref 22–32)
Calcium: 9 mg/dL (ref 8.9–10.3)
Chloride: 104 mmol/L (ref 101–111)
Creatinine, Ser: 1.92 mg/dL — ABNORMAL HIGH (ref 0.61–1.24)
GFR, EST AFRICAN AMERICAN: 44 mL/min — AB (ref >60)
GFR, EST NON AFRICAN AMERICAN: 38 mL/min — AB (ref >60)
GLUCOSE: 89 mg/dL (ref 65–99)
POTASSIUM: 4.2 mmol/L (ref 3.5–5.1)
Sodium: 139 mmol/L (ref 135–145)
TOTAL PROTEIN: 7.7 g/dL (ref 6.5–8.1)

## 2016-07-23 LAB — APTT: APTT: 25 s (ref 24–36)

## 2016-07-23 LAB — PROTIME-INR
INR: 1.15
Prothrombin Time: 14.8 seconds (ref 11.4–15.2)

## 2016-07-23 MED ORDER — OXYCODONE HCL 5 MG PO TABS
5.0000 mg | ORAL_TABLET | Freq: Once | ORAL | Status: AC
  Administered 2016-07-23: 5 mg via ORAL

## 2016-07-23 MED ORDER — SODIUM CHLORIDE 0.9 % IV SOLN
1000.0000 mL | Freq: Once | INTRAVENOUS | Status: AC
  Administered 2016-07-23: 1000 mL via INTRAVENOUS

## 2016-07-23 MED ORDER — OXYCODONE HCL 5 MG PO TABS
ORAL_TABLET | ORAL | Status: AC
  Administered 2016-07-23: 5 mg via ORAL
  Filled 2016-07-23: qty 1

## 2016-07-23 MED ORDER — OXYCODONE HCL 5 MG PO TABS: 5.0000 mg | ORAL_TABLET | Freq: Three times a day (TID) | ORAL | 0 refills | Status: DC | PRN

## 2016-07-23 NOTE — ED Provider Notes (Signed)
[facility] Emergency Department Provider Note   ____________________________________________    I have reviewed the triage vital signs and the nursing notes.   HISTORY  Chief Complaint GI Bleeding     HPI Stuart Spence is a 56 y.o. male Who presents with complaints of blood in ient reports this morning when he went to have a bowel movement he noted bloody stool. Patient being treated for lung cancer had chemotherapy on Monday of this week. He denies abdominal pain. No fevers or chills. Does report lightheadedness. He has never had rectal bleeding before. Dr. Mike Gip is his oncologist.   Past Medical History:  Diagnosis Date  . Cancer ([place])    Lung  . Depression   . ED (erectile dysfunction)   . Gout   . Hypertension   . On home oxygen therapy   . Personal history of chemotherapy    PT TAKING CHEMO EVERY 3 WEEKS    Patient Active Problem List   Diagnosis Date Noted  . Anemia 07/19/2016  . Renal insufficiency 07/19/2016  . Cancer of upper lobe of right lung ([place]) 07/09/2016  . Counseling regarding goals of care 07/09/2016  . Rectal bleeding 06/23/2016  . Shingles 06/22/2016  . Encounter for antineoplastic chemotherapy 05/08/2016  . Encounter for antineoplastic immunotherapy 05/08/2016  . Hypocalcemia 09/12/2015  . Hyperglycemia, unspecified 09/12/2015  . Bone metastasis ([place]) 08/20/2015  . Adenocarcinoma, lung ([place]) 08/19/2015  . Pleural effusion, right   . Respiratory failure with hypoxia ([place]) 08/10/2015  . Essential hypertension 06/23/2015  . Depression 06/23/2015    Past Surgical History:  Procedure Laterality Date  . CHEST TUBE INSERTION Left 08/13/2015   Procedure: CHEST TUBE INSERTION;  Surgeon: Nestor Lewandowsky, MD;  Location: [facility] ORS;  Service: General;  Laterality: Left;  . CHEST TUBE INSERTION N/A 09/18/2015   Procedure: PLEURX CATH REMOVAL;  Surgeon: Nestor Lewandowsky, MD;  Location: [facility] ORS;  Service: Thoracic;  Laterality:  N/A;  . PORTACATH PLACEMENT Left 08/13/2015   Procedure: INSERTION PORT-A-CATH;  Surgeon: Nestor Lewandowsky, MD;  Location: [facility] ORS;  Service: General;  Laterality: Left;  . PORTACATH PLACEMENT    . TONSILLECTOMY      Prior to Admission medications   Medication Sig Start Date End Date Taking? Authorizing Provider  albuterol (PROVENTIL HFA;VENTOLIN HFA) 108 (90 Base) MCG/ACT inhaler Inhale 1-2 puffs into the lungs See admin instructions. Reported on 09/12/2015   Yes Historical Provider, MD  amLODipine (NORVASC) 5 MG tablet Take 1 tablet (5 mg total) by mouth daily. 06/22/16  Yes Guadalupe Maple, MD  benazepril (LOTENSIN) 40 MG tablet Take 40 mg by mouth daily.   Yes Historical Provider, MD  Calcium Carbonate (CALCIUM 600 PO) Take 1,200 mg by mouth 2 (two) times daily.   Yes Historical Provider, MD  colchicine 0.6 MG tablet Take 0.6 mg by mouth daily as needed (two pills by mouth at onset, the one pill one hour later, maximum three pills per gout flare).   Yes Historical Provider, MD  dexamethasone (DECADRON) 4 MG tablet Take 2 tablets (8 mg total) by mouth 2 (two) times daily. Start the day before Taxotere. Then daily after chemo for 2 days. 07/18/16  Yes Lequita Asal, MD  Dextromethorphan Polistirex (DELSYM PO) Take by mouth.   Yes Historical Provider, MD  FLUoxetine (PROZAC) 20 MG capsule Take 2 capsules (40 mg total) by mouth every morning. 06/22/16  Yes Guadalupe Maple, MD  folic acid (FOLVITE) 1 MG tablet Take 1  tablet (1 mg total) by mouth daily. 02/12/16  Yes Lequita Asal, MD  lidocaine-prilocaine (EMLA) cream Apply 1 application topically as needed. 08/21/15  Yes Lequita Asal, MD  nystatin (NYSTATIN) powder Apply topically 3 (three) times daily as needed. 05/28/16  Yes Lequita Asal, MD  ondansetron (ZOFRAN) 8 MG tablet Take 1 tablet (8 mg total) by mouth every 8 (eight) hours as needed for nausea or vomiting. 08/21/15  Yes Lequita Asal, MD  oxyCODONE (OXY IR/ROXICODONE) 5  MG immediate release tablet 1 tablet every 4-6 hours as need for pain 06/18/16  Yes Sindy Guadeloupe, MD  pantoprazole (PROTONIX) 40 MG tablet Take 1 tablet (40 mg total) by mouth daily. 06/25/16  Yes Henreitta Leber, MD  sildenafil (VIAGRA) 100 MG tablet Take 1 tablet (100 mg total) by mouth daily as needed for erectile dysfunction. 12/24/15  Yes Guadalupe Maple, MD  OXYGEN Inhale 2 L into the lungs continuous. Reported on 09/12/2015    Historical Provider, MD  telmisartan (MICARDIS) 40 MG tablet Take 1 tablet (40 mg total) by mouth daily. Patient not taking: Reported on 07/23/2016 06/22/16   Guadalupe Maple, MD     Allergies Patient has no known allergies.  Family History  Problem Relation Age of Onset  . Cancer Mother 71    lung  . Heart attack Father 80  . Hypertension Father   . Congestive Heart Failure Father 57    died from  . Diabetes Sister     Social History Social History  Substance Use Topics  . Smoking status: Never Smoker  . Smokeless tobacco: Never Used  . Alcohol use No     Comment: gave it up in August 2016    Review of Systems  Constitutional: No fever/chills Eyes: No visual changes.  ENT: No sore throat. Cardiovascular: Denies chest pain. Respiratory: Denies shortness of breath. Gastrointestinal: as above Genitourinary: Negative for dysuria. Musculoskeletal: Negative for back pain. Skin: Negative for rash. Neurological: Negative for headaches   10-point ROS otherwise negative.  ____________________________________________   PHYSICAL EXAM:  VITAL SIGNS: ED Triage Vitals  Enc Vitals Group     BP 07/23/16 1022 117/77     Pulse Rate 07/23/16 1022 (!) 115     Resp 07/23/16 1022 16     Temp 07/23/16 1020 98.1 F (36.7 C)     Temp Source 07/23/16 1020 Oral     SpO2 07/23/16 1022 94 %     Weight 07/23/16 1020 229 lb (103.9 kg)     Height 07/23/16 1020 '5\' 6"'$  (1.676 m)     Head Circumference --      Peak Flow --      Pain Score --      Pain Loc --       Pain Edu? --      Excl. in [place]? --     Constitutional: Alert and oriented. No acute distress. Pleasant and interactive Eyes: Conjunctivae are normal.   Nose: No congestion/rhinnorhea. Mouth/Throat: Mucous membranes are moist.   Neck:  Painless ROM Cardiovascular: achycardia, regular rhythm. Grossly normal heart sounds.  Good peripheral circulation. Respiratory: Normal respiratory effort.  No retractions. Lungs CTAB. Gastrointestinal: Soft and nontender. No distention.  No CVA tenderness. Genitourinary: deferred Musculoskeletal: Warm and well perfused Neurologic:  Normal speech and language. No gross focal neurologic deficits are appreciated.  Skin:  Skin is warm, dry and intact. No rash noted. Psychiatric: Mood and affect are normal. Speech and behavior are  normal.  ____________________________________________   LABS (all labs ordered are listed, but only abnormal results are displayed)  Labs Reviewed  COMPREHENSIVE METABOLIC PANEL - Abnormal; Notable for the following:       Result Value   BUN 38 (*)    Creatinine, Ser 1.92 (*)    Alkaline Phosphatase 139 (*)    GFR calc non Af Amer 38 (*)    GFR calc Af Amer 44 (*)    All other components within normal limits  CBC WITH DIFFERENTIAL/PLATELET - Abnormal; Notable for the following:    WBC 17.7 (*)    RBC 3.23 (*)    Hemoglobin 11.4 (*)    HCT 33.9 (*)    MCV 105.0 (*)    MCH 35.3 (*)    RDW 16.6 (*)    Platelets 135 (*)    Neutro Abs 16.3 (*)    All other components within normal limits  APTT  PROTIME-INR  POC OCCULT BLOOD, ED  TYPE AND SCREEN   ____________________________________________  EKG  ED ECG REPORT I, Lavonia Drafts, the attending physician, personally viewed and interpreted this ECG.  Date: 07/23/2016 EKG Time: 10:29 AM Rate: 110 Rhythm: sinus tachycardia QRS Axis: normal Intervals: normal ST/T Wave abnormalities: normal Conduction Disturbances:  none   ____________________________________________  RADIOLOGY  None ____________________________________________   PROCEDURES  Procedure(s) performed: No    Critical Care performed:No ____________________________________________   INITIAL IMPRESSION / ASSESSMENT AND PLAN / ED COURSE  Pertinent labs & imaging results that were available during my care of the patient were reviewed by me and considered in my medical decision making (see chart for details).  Patient presents with GI bleeding status post chemotherapy. We will insert IV, check labs, give IV fluids discussed with oncology and carefully monitor.  On review of records patient recently admitted for similar complaints and plan was for outpatient colonoscopy.Patient does have an elevated white blood cell count today but he had Neulasta 5 days ago which is likely the cause. He has no evidence of infection  Discussed with Dr. Grayland Ormond of oncology, he will arrange for close outpatient follow-up  ----------------------------------------- 2:28 PM on 07/23/2016 ----------------------------------------- Patient feels much better and has no complains. He has had no further bleeding. He is greatly reassured by his increased hemoglobin. Discussed with patient and his wife and his sister-in-law, their biggest concern is that his GI appointment is not until May 14. We were able to move this up to the 23rd and they're quite content with this.   ____________________________________________   FINAL CLINICAL IMPRESSION(S) / ED DIAGNOSES  Final diagnoses:  Rectal bleeding      NEW MEDICATIONS STARTED DURING THIS VISIT:  New Prescriptions   No medications on file     Note:  This document was prepared using Dragon voice recognition software and may include unintentional dictation errors.    Lavonia Drafts, MD 07/23/16 [phone]

## 2016-07-23 NOTE — ED Notes (Signed)
Pt up to toilet in room with no difficulty or distress, able to urinate into toilet.

## 2016-07-23 NOTE — ED Triage Notes (Signed)
Pt here with blood loose stool today, hx of lung cancer, last chemo was Monday. Pt with hx of GI bleed, unable to find the cause. Pt reports decreased appetite. EMS reports hr 110, CBG 100.

## 2016-07-23 NOTE — Discharge Instructions (Signed)
Return to the ED if you have worsening bleeding. Please be sure to make your appointment at 10 AM on Monday

## 2016-07-24 ENCOUNTER — Encounter: Payer: Self-pay | Admitting: *Deleted

## 2016-07-24 ENCOUNTER — Observation Stay
Admission: EM | Admit: 2016-07-24 | Discharge: 2016-07-27 | Disposition: A | Discharge status: Home / Self Care | Payer: No Typology Code available for payment source | Attending: Specialist | Admitting: Specialist

## 2016-07-24 DIAGNOSIS — K219 Gastro-esophageal reflux disease without esophagitis: Secondary | ICD-10-CM | POA: Insufficient documentation

## 2016-07-24 DIAGNOSIS — D630 Anemia in neoplastic disease: Secondary | ICD-10-CM | POA: Insufficient documentation

## 2016-07-24 DIAGNOSIS — I129 Hypertensive chronic kidney disease with stage 1 through stage 4 chronic kidney disease, or unspecified chronic kidney disease: Secondary | ICD-10-CM | POA: Diagnosis not present

## 2016-07-24 DIAGNOSIS — C3411 Malignant neoplasm of upper lobe, right bronchus or lung: Secondary | ICD-10-CM | POA: Diagnosis not present

## 2016-07-24 DIAGNOSIS — Z801 Family history of malignant neoplasm of trachea, bronchus and lung: Secondary | ICD-10-CM | POA: Insufficient documentation

## 2016-07-24 DIAGNOSIS — J90 Pleural effusion, not elsewhere classified: Secondary | ICD-10-CM | POA: Diagnosis not present

## 2016-07-24 DIAGNOSIS — R197 Diarrhea, unspecified: Secondary | ICD-10-CM | POA: Insufficient documentation

## 2016-07-24 DIAGNOSIS — B029 Zoster without complications: Secondary | ICD-10-CM | POA: Insufficient documentation

## 2016-07-24 DIAGNOSIS — T451X5A Adverse effect of antineoplastic and immunosuppressive drugs, initial encounter: Secondary | ICD-10-CM | POA: Insufficient documentation

## 2016-07-24 DIAGNOSIS — Z79899 Other long term (current) drug therapy: Secondary | ICD-10-CM | POA: Diagnosis not present

## 2016-07-24 DIAGNOSIS — R112 Nausea with vomiting, unspecified: Secondary | ICD-10-CM | POA: Diagnosis present

## 2016-07-24 DIAGNOSIS — F329 Major depressive disorder, single episode, unspecified: Secondary | ICD-10-CM | POA: Insufficient documentation

## 2016-07-24 DIAGNOSIS — Z7951 Long term (current) use of inhaled steroids: Secondary | ICD-10-CM | POA: Insufficient documentation

## 2016-07-24 DIAGNOSIS — K1230 Oral mucositis (ulcerative), unspecified: Secondary | ICD-10-CM | POA: Diagnosis not present

## 2016-07-24 DIAGNOSIS — J9611 Chronic respiratory failure with hypoxia: Secondary | ICD-10-CM | POA: Insufficient documentation

## 2016-07-24 DIAGNOSIS — E1122 Type 2 diabetes mellitus with diabetic chronic kidney disease: Secondary | ICD-10-CM | POA: Insufficient documentation

## 2016-07-24 DIAGNOSIS — Z833 Family history of diabetes mellitus: Secondary | ICD-10-CM | POA: Insufficient documentation

## 2016-07-24 DIAGNOSIS — Z8249 Family history of ischemic heart disease and other diseases of the circulatory system: Secondary | ICD-10-CM | POA: Insufficient documentation

## 2016-07-24 DIAGNOSIS — N183 Chronic kidney disease, stage 3 (moderate): Secondary | ICD-10-CM | POA: Insufficient documentation

## 2016-07-24 DIAGNOSIS — M109 Gout, unspecified: Secondary | ICD-10-CM | POA: Diagnosis not present

## 2016-07-24 DIAGNOSIS — K121 Other forms of stomatitis: Secondary | ICD-10-CM | POA: Insufficient documentation

## 2016-07-24 DIAGNOSIS — K625 Hemorrhage of anus and rectum: Secondary | ICD-10-CM | POA: Diagnosis not present

## 2016-07-24 DIAGNOSIS — C7951 Secondary malignant neoplasm of bone: Secondary | ICD-10-CM | POA: Insufficient documentation

## 2016-07-24 DIAGNOSIS — Z9981 Dependence on supplemental oxygen: Secondary | ICD-10-CM | POA: Insufficient documentation

## 2016-07-24 LAB — CBC WITH DIFFERENTIAL/PLATELET
BASOS PCT: 1 %
Basophils Absolute: 0 10*3/uL (ref 0–0.1)
EOS ABS: 0.1 10*3/uL (ref 0–0.7)
Eosinophils Relative: 2 %
HCT: 33.7 % — ABNORMAL LOW (ref 40.0–52.0)
Hemoglobin: 11.2 g/dL — ABNORMAL LOW (ref 13.0–18.0)
Lymphocytes Relative: 19 %
Lymphs Abs: 0.9 10*3/uL — ABNORMAL LOW (ref 1.0–3.6)
MCH: 35.2 pg — AB (ref 26.0–34.0)
MCHC: 33.4 g/dL (ref 32.0–36.0)
MCV: 105.3 fL — ABNORMAL HIGH (ref 80.0–100.0)
MONO ABS: 0.3 10*3/uL (ref 0.2–1.0)
Monocytes Relative: 7 %
NEUTROS ABS: 3.4 10*3/uL (ref 1.4–6.5)
NEUTROS PCT: 71 %
PLATELETS: 131 10*3/uL — AB (ref 150–440)
RBC: 3.2 MIL/uL — ABNORMAL LOW (ref 4.40–5.90)
RDW: 16.5 % — ABNORMAL HIGH (ref 11.5–14.5)
WBC: 4.7 10*3/uL (ref 3.8–10.6)

## 2016-07-24 LAB — C DIFFICILE QUICK SCREEN W PCR REFLEX
C DIFFICLE (CDIFF) ANTIGEN: NEGATIVE
C Diff interpretation: NOT DETECTED
C Diff toxin: NEGATIVE

## 2016-07-24 LAB — COMPREHENSIVE METABOLIC PANEL
ALBUMIN: 3.5 g/dL (ref 3.5–5.0)
ALK PHOS: 139 U/L — AB (ref 38–126)
ALT: 19 U/L (ref 17–63)
AST: 27 U/L (ref 15–41)
Anion gap: 7 (ref 5–15)
BUN: 29 mg/dL — AB (ref 6–20)
CHLORIDE: 101 mmol/L (ref 101–111)
CO2: 29 mmol/L (ref 22–32)
CREATININE: 1.8 mg/dL — AB (ref 0.61–1.24)
Calcium: 8.7 mg/dL — ABNORMAL LOW (ref 8.9–10.3)
GFR calc Af Amer: 47 mL/min — ABNORMAL LOW (ref >60)
GFR calc non Af Amer: 41 mL/min — ABNORMAL LOW (ref >60)
GLUCOSE: 92 mg/dL (ref 65–99)
Potassium: 4.4 mmol/L (ref 3.5–5.1)
SODIUM: 137 mmol/L (ref 135–145)
Total Bilirubin: 1.1 mg/dL (ref 0.3–1.2)
Total Protein: 7.3 g/dL (ref 6.5–8.1)

## 2016-07-24 LAB — LIPASE, BLOOD: Lipase: 33 U/L (ref 11–51)

## 2016-07-24 MED ORDER — PROMETHAZINE HCL 25 MG/ML IJ SOLN
25.0000 mg | Freq: Once | INTRAMUSCULAR | Status: AC
  Administered 2016-07-24: 25 mg via INTRAVENOUS
  Filled 2016-07-24: qty 1

## 2016-07-24 MED ORDER — ONDANSETRON HCL 4 MG/2ML IJ SOLN: 4.0000 mg | Freq: Four times a day (QID) | INTRAMUSCULAR | Status: DC | PRN

## 2016-07-24 MED ORDER — LIDOCAINE VISCOUS 2 % MT SOLN
15.0000 mL | Freq: Once | OROMUCOSAL | Status: AC
  Administered 2016-07-24: 15 mL via OROMUCOSAL
  Filled 2016-07-24: qty 15

## 2016-07-24 MED ORDER — ONDANSETRON HCL 4 MG/2ML IJ SOLN
4.0000 mg | Freq: Once | INTRAMUSCULAR | Status: AC
  Administered 2016-07-24: 4 mg via INTRAVENOUS
  Filled 2016-07-24: qty 2

## 2016-07-24 MED ORDER — SODIUM CHLORIDE 0.9 % IV SOLN
1000.0000 mL | Freq: Once | INTRAVENOUS | Status: AC
  Administered 2016-07-24: 1000 mL via INTRAVENOUS

## 2016-07-24 MED ORDER — COLCHICINE 0.6 MG PO TABS
0.6000 mg | ORAL_TABLET | Freq: Every day | ORAL | Status: DC | PRN
  Filled 2016-07-24: qty 1

## 2016-07-24 MED ORDER — ACETAMINOPHEN 325 MG PO TABS: 650.0000 mg | ORAL_TABLET | Freq: Four times a day (QID) | ORAL | Status: DC | PRN

## 2016-07-24 MED ORDER — KETOROLAC TROMETHAMINE 30 MG/ML IJ SOLN
30.0000 mg | Freq: Four times a day (QID) | INTRAMUSCULAR | Status: DC | PRN
Start: 2016-07-24 — End: 2016-07-27

## 2016-07-24 MED ORDER — NYSTATIN 100000 UNIT/ML MT SUSP
5.0000 mL | Freq: Four times a day (QID) | OROMUCOSAL | Status: DC
  Administered 2016-07-24 – 2016-07-27 (×10): 500000 [IU] via OROMUCOSAL
  Filled 2016-07-24 (×10): qty 5

## 2016-07-24 MED ORDER — AMLODIPINE BESYLATE 5 MG PO TABS
5.0000 mg | ORAL_TABLET | Freq: Every day | ORAL | Status: DC
  Administered 2016-07-25 – 2016-07-27 (×3): 5 mg via ORAL
  Filled 2016-07-24 (×3): qty 1

## 2016-07-24 MED ORDER — DEXAMETHASONE 4 MG PO TABS: 8.0000 mg | ORAL_TABLET | Freq: Two times a day (BID) | ORAL | Status: DC

## 2016-07-24 MED ORDER — ALBUTEROL SULFATE (2.5 MG/3ML) 0.083% IN NEBU: 2.5000 mg | INHALATION_SOLUTION | RESPIRATORY_TRACT | Status: DC | PRN

## 2016-07-24 MED ORDER — FLUOXETINE HCL 20 MG PO CAPS
40.0000 mg | ORAL_CAPSULE | ORAL | Status: DC
  Administered 2016-07-25 – 2016-07-27 (×3): 40 mg via ORAL
  Filled 2016-07-24 (×3): qty 2

## 2016-07-24 MED ORDER — ONDANSETRON HCL 4 MG PO TABS: 4.0000 mg | ORAL_TABLET | Freq: Four times a day (QID) | ORAL | Status: DC | PRN

## 2016-07-24 MED ORDER — HYDROCODONE-ACETAMINOPHEN 5-325 MG PO TABS: 1.0000 | ORAL_TABLET | ORAL | Status: DC | PRN

## 2016-07-24 MED ORDER — POTASSIUM CHLORIDE IN NACL 20-0.9 MEQ/L-% IV SOLN
INTRAVENOUS | Status: DC
  Administered 2016-07-24 – 2016-07-27 (×6): via INTRAVENOUS
  Filled 2016-07-24 (×9): qty 1000

## 2016-07-24 MED ORDER — BENAZEPRIL HCL 20 MG PO TABS
40.0000 mg | ORAL_TABLET | Freq: Every day | ORAL | Status: DC
  Administered 2016-07-25 – 2016-07-27 (×3): 40 mg via ORAL
  Filled 2016-07-24: qty 1
  Filled 2016-07-24 (×2): qty 2

## 2016-07-24 MED ORDER — ALBUTEROL SULFATE HFA 108 (90 BASE) MCG/ACT IN AERS: 1.0000 | INHALATION_SPRAY | RESPIRATORY_TRACT | Status: DC

## 2016-07-24 MED ORDER — PANTOPRAZOLE SODIUM 40 MG PO TBEC
40.0000 mg | DELAYED_RELEASE_TABLET | Freq: Every day | ORAL | Status: DC
  Administered 2016-07-25 – 2016-07-27 (×3): 40 mg via ORAL
  Filled 2016-07-24 (×4): qty 1

## 2016-07-24 MED ORDER — PHENOL 1.4 % MT LIQD
1.0000 | OROMUCOSAL | Status: DC | PRN
  Administered 2016-07-25 (×2): 1 via OROMUCOSAL
  Filled 2016-07-24: qty 177

## 2016-07-24 MED ORDER — HEPARIN SODIUM (PORCINE) 5000 UNIT/ML IJ SOLN
5000.0000 [IU] | Freq: Three times a day (TID) | INTRAMUSCULAR | Status: DC
  Administered 2016-07-24 – 2016-07-27 (×7): 5000 [IU] via SUBCUTANEOUS
  Filled 2016-07-24 (×8): qty 1

## 2016-07-24 MED ORDER — FOLIC ACID 1 MG PO TABS
1.0000 mg | ORAL_TABLET | Freq: Every day | ORAL | Status: DC
  Administered 2016-07-25 – 2016-07-27 (×3): 1 mg via ORAL
  Filled 2016-07-24 (×3): qty 1

## 2016-07-24 MED ORDER — ACETAMINOPHEN 650 MG RE SUPP: 650.0000 mg | Freq: Four times a day (QID) | RECTAL | Status: DC | PRN

## 2016-07-24 MED ORDER — CALCIUM CARBONATE ANTACID 500 MG PO CHEW
1200.0000 mg | CHEWABLE_TABLET | Freq: Two times a day (BID) | ORAL | Status: DC
  Administered 2016-07-25 – 2016-07-27 (×5): 1250 mg via ORAL
  Filled 2016-07-24 (×3): qty 3
  Filled 2016-07-24: qty 2.5
  Filled 2016-07-24: qty 3
  Filled 2016-07-24: qty 2.5

## 2016-07-24 NOTE — H&P (Signed)
[facility] [place] NAME: Stuart Spence    MR#:  [id number]  DATE OF BIRTH:  12/15/1960  DATE OF ADMISSION:  07/24/2016  PRIMARY CARE PHYSICIAN: Golden Pop, MD   REQUESTING/REFERRING PHYSICIAN: Earleen Newport, MD  CHIEF COMPLAINT:   Chief Complaint  Patient presents with  . Nausea  . Diarrhea   Nausea and Diarrhea HISTORY OF PRESENT ILLNESS:  Stuart Spence  is a 56 y.o. male with a known history of lung cancer, HTN and depression. He come to ED due to nausea, vomiting and diarrhea that started yesterday. He got chemotherapy last week. He feels very weak.  PAST MEDICAL HISTORY:   Past Medical History:  Diagnosis Date  . Cancer ([place])    Lung  . Depression   . ED (erectile dysfunction)   . Gout   . Hypertension   . On home oxygen therapy   . Personal history of chemotherapy    PT TAKING CHEMO EVERY 3 WEEKS    PAST SURGICAL HISTORY:   Past Surgical History:  Procedure Laterality Date  . CHEST TUBE INSERTION Left 08/13/2015   Procedure: CHEST TUBE INSERTION;  Surgeon: Nestor Lewandowsky, MD;  Location: [facility] ORS;  Service: General;  Laterality: Left;  . CHEST TUBE INSERTION N/A 09/18/2015   Procedure: PLEURX CATH REMOVAL;  Surgeon: Nestor Lewandowsky, MD;  Location: [facility] ORS;  Service: Thoracic;  Laterality: N/A;  . PORTACATH PLACEMENT Left 08/13/2015   Procedure: INSERTION PORT-A-CATH;  Surgeon: Nestor Lewandowsky, MD;  Location: [facility] ORS;  Service: General;  Laterality: Left;  . PORTACATH PLACEMENT    . TONSILLECTOMY      SOCIAL HISTORY:   Social History  Substance Use Topics  . Smoking status: Never Smoker  . Smokeless tobacco: Never Used  . Alcohol use No     Comment: gave it up in August 2016    FAMILY HISTORY:   Family History  Problem Relation Age of Onset  . Cancer Mother 25    lung  . Heart attack Father 12  . Hypertension Father   . Congestive Heart Failure Father 67    died from  . Diabetes Sister     DRUG  ALLERGIES:  No Known Allergies  REVIEW OF SYSTEMS:   Review of Systems  Constitutional: Positive for malaise/fatigue. Negative for chills and fever.  HENT: Positive for sore throat. Negative for congestion.   Eyes: Negative for blurred vision and double vision.  Respiratory: Negative for cough, hemoptysis, shortness of breath and stridor.   Cardiovascular: Negative for chest pain and leg swelling.  Gastrointestinal: Positive for diarrhea, nausea and vomiting. Negative for abdominal pain, blood in stool and melena.  Genitourinary: Negative for dysuria and hematuria.  Musculoskeletal: Negative for back pain.  Skin: Negative for itching and rash.  Neurological: Positive for weakness. Negative for dizziness, focal weakness and loss of consciousness.  Psychiatric/Behavioral: Negative for depression. The patient is not nervous/anxious.     MEDICATIONS AT HOME:   Prior to Admission medications   Medication Sig Start Date End Date Taking? Authorizing Provider  albuterol (PROVENTIL HFA;VENTOLIN HFA) 108 (90 Base) MCG/ACT inhaler Inhale 1-2 puffs into the lungs See admin instructions. Reported on 09/12/2015   Yes Historical Provider, MD  amLODipine (NORVASC) 5 MG tablet Take 1 tablet (5 mg total) by mouth daily. 06/22/16  Yes Guadalupe Maple, MD  benazepril (LOTENSIN) 40 MG tablet Take 40 mg by mouth daily.   Yes Historical Provider, MD  Calcium  Carbonate (CALCIUM 600 PO) Take 1,200 mg by mouth 2 (two) times daily.   Yes Historical Provider, MD  colchicine 0.6 MG tablet Take 0.6 mg by mouth daily as needed (two pills by mouth at onset, the one pill one hour later, maximum three pills per gout flare).   Yes Historical Provider, MD  dexamethasone (DECADRON) 4 MG tablet Take 2 tablets (8 mg total) by mouth 2 (two) times daily. Start the day before Taxotere. Then daily after chemo for 2 days. 07/18/16  Yes Lequita Asal, MD  Dextromethorphan Polistirex (DELSYM PO) Take by mouth.   Yes Historical  Provider, MD  FLUoxetine (PROZAC) 20 MG capsule Take 2 capsules (40 mg total) by mouth every morning. 06/22/16  Yes Guadalupe Maple, MD  folic acid (FOLVITE) 1 MG tablet Take 1 tablet (1 mg total) by mouth daily. 02/12/16  Yes Lequita Asal, MD  lidocaine-prilocaine (EMLA) cream Apply 1 application topically as needed. 08/21/15  Yes Lequita Asal, MD  nystatin (NYSTATIN) powder Apply topically 3 (three) times daily as needed. 05/28/16  Yes Lequita Asal, MD  ondansetron (ZOFRAN) 8 MG tablet Take 1 tablet (8 mg total) by mouth every 8 (eight) hours as needed for nausea or vomiting. 08/21/15  Yes Lequita Asal, MD  oxyCODONE (ROXICODONE) 5 MG immediate release tablet Take 1 tablet (5 mg total) by mouth every 8 (eight) hours as needed. 07/23/16 07/23/17 Yes Lavonia Drafts, MD  pantoprazole (PROTONIX) 40 MG tablet Take 1 tablet (40 mg total) by mouth daily. 06/25/16  Yes Henreitta Leber, MD  sildenafil (VIAGRA) 100 MG tablet Take 1 tablet (100 mg total) by mouth daily as needed for erectile dysfunction. 12/24/15  Yes Guadalupe Maple, MD  OXYGEN Inhale 2 L into the lungs continuous. Reported on 09/12/2015    Historical Provider, MD  telmisartan (MICARDIS) 40 MG tablet Take 1 tablet (40 mg total) by mouth daily. Patient not taking: Reported on 07/23/2016 06/22/16   Guadalupe Maple, MD      VITAL SIGNS:  Blood pressure 140/79, pulse 99, temperature 97.7 F (36.5 C), temperature source Oral, resp. rate 19, height '5\' 6"'$  (1.676 m), weight 229 lb (103.9 kg), SpO2 92 %.  PHYSICAL EXAMINATION:  Physical Exam  GENERAL:  56 y.o.-year-old patient lying in the bed with no acute distress.  EYES: Pupils equal, round, reactive to light and accommodation. No scleral icterus. Extraocular muscles intact.  HEENT: Head atraumatic, normocephalic. Oropharynx and nasopharynx clear.  NECK:  Supple, no jugular venous distention. No thyroid enlargement, no tenderness.  LUNGS: Normal breath sounds bilaterally, no  wheezing, rales,rhonchi or crepitation. No use of accessory muscles of respiration.  CARDIOVASCULAR: S1, S2 normal. No murmurs, rubs, or gallops.  ABDOMEN: Soft, nontender, nondistended. Bowel sounds present. No organomegaly or mass.  EXTREMITIES: No pedal edema, cyanosis, or clubbing.  NEUROLOGIC: Cranial nerves II through XII are intact. Muscle strength 5/5 in all extremities. Sensation intact. Gait not checked.  PSYCHIATRIC: The patient is alert and oriented x 3.  SKIN: No obvious rash, lesion, or ulcer.   LABORATORY PANEL:   CBC  Recent Labs Lab 07/24/16 1649  WBC 4.7  HGB 11.2*  HCT 33.7*  PLT 131*   ------------------------------------------------------------------------------------------------------------------  Chemistries   Recent Labs Lab 07/24/16 1755  NA 137  K 4.4  CL 101  CO2 29  GLUCOSE 92  BUN 29*  CREATININE 1.80*  CALCIUM 8.7*  AST 27  ALT 19  ALKPHOS 139*  BILITOT 1.1   ------------------------------------------------------------------------------------------------------------------  Cardiac Enzymes No results for input(s): TROPONINI in the last 168 hours. ------------------------------------------------------------------------------------------------------------------  RADIOLOGY:  No results found.    IMPRESSION AND PLAN:   Nausea, vomiting and diarrhea, chemo induced. Observation. zofran prn, IVF support.  HTN. Continue norvasc.  CKD stage 3. Stable.    Lung cancer. f/u oncologist as outpatient.  All the records are reviewed and case discussed with ED provider. Management plans discussed with the patient, his wife and they are in agreement.  CODE STATUS: DNR (the patient wants DNR).  TOTAL TIME TAKING CARE OF THIS PATIENT: 46 minutes.    Demetrios Loll M.D on 07/24/2016 at 7:05 PM  Between 7am to 6pm - Pager - [phone]  After 6pm go to [email or web address] - Proofreader  [facility]  Office   [phone]  CC: Primary care physician; Golden Pop, MD   Note: This dictation was prepared with Dragon dictation along with smaller phrase technology. Any transcriptional errors that result from this process are unintentional.

## 2016-07-24 NOTE — ED Triage Notes (Signed)
Pt came to ED via EMS Seen in Ed yesterday for n/v/d. Pt has stage 4 lung cancer, last chemo was last week. Reports 6 episodes of diarrhea today and yestrday.

## 2016-07-24 NOTE — ED Notes (Signed)
Patient's pulse ox was removed by patient. RN to room to reapply sensor. Pt's oxygen saturation level a 84%. Pt report he normally wears O2 at 2L [place] at home. Patient has not been on oxygen while in the ED today per patient. RN applied O2 at 2L [place] to patient. PT's oxygen saturation increased to 95%. RN will continue to monitor.

## 2016-07-24 NOTE — ED Notes (Signed)
Pt reports nausea has improved. No episodes of diarrhea since pt has been in ED

## 2016-07-24 NOTE — ED Provider Notes (Signed)
[facility] Emergency Department Provider Note       Time seen: ----------------------------------------- 4:53 PM on 07/24/2016 -----------------------------------------     I have reviewed the triage vital signs and the nursing notes.   HISTORY   Chief Complaint Nausea and Diarrhea    HPI Jabir Dahlem is a 56 y.o. male who presents to the ED for nausea, vomiting and diarrhea that started yesterday. Patient's last chemo was last week, Oncologist is Dr. Mike Gip. Patient reportedly has stage 4 lung cancer. He denies fevers, chills, chest pain, sob but has more diarrhea than vomiting. Patient had blood in his stool yesterday but denies any further bleeding   Past Medical History:  Diagnosis Date  . Cancer ([place])    Lung  . Depression   . ED (erectile dysfunction)   . Gout   . Hypertension   . On home oxygen therapy   . Personal history of chemotherapy    PT TAKING CHEMO EVERY 3 WEEKS    Patient Active Problem List   Diagnosis Date Noted  . Anemia 07/19/2016  . Renal insufficiency 07/19/2016  . Cancer of upper lobe of right lung ([place]) 07/09/2016  . Counseling regarding goals of care 07/09/2016  . Rectal bleeding 06/23/2016  . Shingles 06/22/2016  . Encounter for antineoplastic chemotherapy 05/08/2016  . Encounter for antineoplastic immunotherapy 05/08/2016  . Hypocalcemia 09/12/2015  . Hyperglycemia, unspecified 09/12/2015  . Bone metastasis ([place]) 08/20/2015  . Adenocarcinoma, lung ([place]) 08/19/2015  . Pleural effusion, right   . Respiratory failure with hypoxia ([place]) 08/10/2015  . Essential hypertension 06/23/2015  . Depression 06/23/2015    Past Surgical History:  Procedure Laterality Date  . CHEST TUBE INSERTION Left 08/13/2015   Procedure: CHEST TUBE INSERTION;  Surgeon: Nestor Lewandowsky, MD;  Location: [facility] ORS;  Service: General;  Laterality: Left;  . CHEST TUBE INSERTION N/A 09/18/2015   Procedure: PLEURX CATH REMOVAL;  Surgeon:  Nestor Lewandowsky, MD;  Location: [facility] ORS;  Service: Thoracic;  Laterality: N/A;  . PORTACATH PLACEMENT Left 08/13/2015   Procedure: INSERTION PORT-A-CATH;  Surgeon: Nestor Lewandowsky, MD;  Location: [facility] ORS;  Service: General;  Laterality: Left;  . PORTACATH PLACEMENT    . TONSILLECTOMY      Allergies Patient has no known allergies.  Social History Social History  Substance Use Topics  . Smoking status: Never Smoker  . Smokeless tobacco: Never Used  . Alcohol use No     Comment: gave it up in August 2016   Review of Systems Constitutional: Negative for fever. Cardiovascular: Negative for chest pain. Respiratory: Negative for shortness of breath. Gastrointestinal: Negative for abdominal pain, positive for vomiting and diarrhea. Genitourinary: Negative for dysuria. Musculoskeletal: Negative for back pain. Skin: Negative for rash. Neurological: Negative for headaches, positive for weakness  10-point ROS otherwise negative.  ____________________________________________   PHYSICAL EXAM:  VITAL SIGNS: ED Triage Vitals [07/24/16 1649]  Enc Vitals Group     BP 131/77     Pulse Rate (!) 102     Resp 18     Temp 97.7 F (36.5 C)     Temp Source Oral     SpO2 98 %     Weight      Height      Head Circumference      Peak Flow      Pain Score      Pain Loc      Pain Edu?      Excl. in [place]?    Constitutional:  Alert and oriented. Well appearing and in no distress. Eyes: Conjunctivae are normal. PERRL. Normal extraocular movements. ENT   Head: Normocephalic and atraumatic.   Nose: No congestion/rhinnorhea.   Mouth/Throat: Mucous membranes are moist.   Neck: No stridor. Cardiovascular: Normal rate, regular rhythm. No murmurs, rubs, or gallops. Respiratory: Normal respiratory effort without tachypnea nor retractions. Breath sounds are clear and equal bilaterally. No wheezes/rales/rhonchi. Gastrointestinal: Soft and nontender. Normal bowel sounds Musculoskeletal:  Nontender with normal range of motion in extremities. No lower extremity tenderness nor edema. Neurologic:  Normal speech and language. No gross focal neurologic deficits are appreciated.  Skin:  Skin is warm, dry and intact. No rash noted. Psychiatric: Mood and affect are normal. Speech and behavior are normal.  ____________________________________________  ED COURSE:  Pertinent labs & imaging results that were available during my care of the patient were reviewed by me and considered in my medical decision making (see chart for details). Patient presents for vomiting and diarrhea, we will assess with labs and imaging as indicated.   Procedures ____________________________________________   LABS (pertinent positives/negatives)  Labs Reviewed  CBC WITH DIFFERENTIAL/PLATELET - Abnormal; Notable for the following:       Result Value   RBC 3.20 (*)    Hemoglobin 11.2 (*)    HCT 33.7 (*)    MCV 105.3 (*)    MCH 35.2 (*)    RDW 16.5 (*)    Platelets 131 (*)    Lymphs Abs 0.9 (*)    All other components within normal limits  COMPREHENSIVE METABOLIC PANEL - Abnormal; Notable for the following:    BUN 29 (*)    Creatinine, Ser 1.80 (*)    Calcium 8.7 (*)    Alkaline Phosphatase 139 (*)    GFR calc non Af Amer 41 (*)    GFR calc Af Amer 47 (*)    All other components within normal limits  C DIFFICILE QUICK SCREEN W PCR REFLEX  GASTROINTESTINAL PANEL BY PCR, STOOL (REPLACES STOOL CULTURE)  LIPASE, BLOOD   ____________________________________________  FINAL ASSESSMENT AND PLAN  Vomiting and diarrhea, weakness  Plan: Patient's labs were dictated above. Patient had presented for vomiting and diarrhea, labs were reassuring but patient and family don't feel comfortable taking him home. We will continue fluids and antiemetics and I will discuss with the hospitalist for admission   Earleen Newport, MD   Note: This note was generated in part or whole with voice recognition  software. Voice recognition is usually quite accurate but there are transcription errors that can and very often do occur. I apologize for any typographical errors that were not detected and corrected.     Earleen Newport, MD 07/24/16 [phone]

## 2016-07-25 LAB — BASIC METABOLIC PANEL
ANION GAP: 6 (ref 5–15)
BUN: 24 mg/dL — AB (ref 6–20)
CHLORIDE: 107 mmol/L (ref 101–111)
CO2: 25 mmol/L (ref 22–32)
Calcium: 8 mg/dL — ABNORMAL LOW (ref 8.9–10.3)
Creatinine, Ser: 1.84 mg/dL — ABNORMAL HIGH (ref 0.61–1.24)
GFR calc Af Amer: 46 mL/min — ABNORMAL LOW (ref >60)
GFR calc non Af Amer: 40 mL/min — ABNORMAL LOW (ref >60)
GLUCOSE: 101 mg/dL — AB (ref 65–99)
POTASSIUM: 4.6 mmol/L (ref 3.5–5.1)
Sodium: 138 mmol/L (ref 135–145)

## 2016-07-25 LAB — CBC
HEMATOCRIT: 27.4 % — AB (ref 40.0–52.0)
HEMOGLOBIN: 9.1 g/dL — AB (ref 13.0–18.0)
MCH: 34.7 pg — ABNORMAL HIGH (ref 26.0–34.0)
MCHC: 33.1 g/dL (ref 32.0–36.0)
MCV: 105.1 fL — AB (ref 80.0–100.0)
Platelets: 113 10*3/uL — ABNORMAL LOW (ref 150–440)
RBC: 2.61 MIL/uL — ABNORMAL LOW (ref 4.40–5.90)
RDW: 16.1 % — AB (ref 11.5–14.5)
WBC: 2.3 10*3/uL — ABNORMAL LOW (ref 3.8–10.6)

## 2016-07-25 LAB — GASTROINTESTINAL PANEL BY PCR, STOOL (REPLACES STOOL CULTURE)

## 2016-07-25 MED ORDER — LOPERAMIDE HCL 2 MG PO CAPS
2.0000 mg | ORAL_CAPSULE | Freq: Four times a day (QID) | ORAL | Status: DC
  Administered 2016-07-25 – 2016-07-27 (×9): 2 mg via ORAL
  Filled 2016-07-25 (×9): qty 1

## 2016-07-25 MED ORDER — LIDOCAINE VISCOUS 2 % MT SOLN
15.0000 mL | OROMUCOSAL | Status: DC | PRN
  Administered 2016-07-25 – 2016-07-27 (×4): 15 mL via OROMUCOSAL
  Filled 2016-07-25 (×5): qty 15

## 2016-07-25 NOTE — Progress Notes (Signed)
[place] at [place] NAME: Stuart Spence    MR#:  [id number]  DATE OF BIRTH:  1961/02/04  SUBJECTIVE:   he is here due to nausea vomiting and diarrhea secondary to chemotherapy. Still having significant diarrhea.  REVIEW OF SYSTEMS:    Review of Systems  Constitutional: Negative for chills and fever.  HENT: Negative for congestion and tinnitus.   Eyes: Negative for blurred vision and double vision.  Respiratory: Negative for cough, shortness of breath and wheezing.   Cardiovascular: Negative for chest pain, orthopnea and PND.  Gastrointestinal: Positive for diarrhea. Negative for abdominal pain, nausea and vomiting.  Genitourinary: Negative for dysuria and hematuria.  Neurological: Negative for dizziness, sensory change and focal weakness.  All other systems reviewed and are negative.   Nutrition: Clear Liquids Tolerating Diet: Yes Tolerating PT: Ambulatory   DRUG ALLERGIES:  No Known Allergies  VITALS:  Blood pressure 122/72, pulse (!) 104, temperature 98.2 F (36.8 C), temperature source Oral, resp. rate 18, height '5\' 6"'$  (1.676 m), weight 103.2 kg (227 lb 8 oz), SpO2 96 %.  PHYSICAL EXAMINATION:   Physical Exam  GENERAL:  56 y.o.-year-old obese patient lying in bed in no acute distress.  EYES: Pupils equal, round, reactive to light and accommodation. No scleral icterus. Extraocular muscles intact.  HEENT: Head atraumatic, normocephalic. Oropharynx and nasopharynx clear.  NECK:  Supple, no jugular venous distention. No thyroid enlargement, no tenderness.  LUNGS: Normal breath sounds bilaterally, no wheezing, rales, rhonchi. No use of accessory muscles of respiration.  CARDIOVASCULAR: S1, S2 normal. No murmurs, rubs, or gallops.  ABDOMEN: Soft, nontender, nondistended. Bowel sounds present. No organomegaly or mass.  EXTREMITIES: No cyanosis, clubbing or edema b/l.    NEUROLOGIC: Cranial nerves II through XII are intact. No focal  Motor or sensory deficits b/l.   PSYCHIATRIC: The patient is alert and oriented x 3.  SKIN: No obvious rash, lesion, or ulcer.    LABORATORY PANEL:   CBC  Recent Labs Lab 07/25/16 0453  WBC 2.3*  HGB 9.1*  HCT 27.4*  PLT 113*   ------------------------------------------------------------------------------------------------------------------  Chemistries   Recent Labs Lab 07/24/16 1755 07/25/16 0453  NA 137 138  K 4.4 4.6  CL 101 107  CO2 29 25  GLUCOSE 92 101*  BUN 29* 24*  CREATININE 1.80* 1.84*  CALCIUM 8.7* 8.0*  AST 27  --   ALT 19  --   ALKPHOS 139*  --   BILITOT 1.1  --    ------------------------------------------------------------------------------------------------------------------  Cardiac Enzymes No results for input(s): TROPONINI in the last 168 hours. ------------------------------------------------------------------------------------------------------------------  RADIOLOGY:  No results found.   ASSESSMENT AND PLAN:   56 year old male with past medical history of lung cancer currently undergoing chemotherapy, chronic anemia, hypertension, gout, depression who presented to the hospital due to nausea vomiting and diarrhea.  1. Nausea vomiting diarrhea-secondary to chemotherapy. Patient's stool for C. difficile and comprehensive culture is negative. -Continue supportive care with fluids and will start some scheduled Imodium.  2. Essential hypertension-continue benazepril, Norvasc  3. History of gout-no acute attack, continue close seen as needed.  4. GERD-continue Protonix.  5. Depression-continue fluoxetine.  6. Pancytopenia-secondary to chemotherapy. No acute need for transfusion. Follow counts.  7. History of lung cancer currently undergoing chemotherapy-continue follow-up with oncology as an outpatient.   All the records are reviewed and case discussed with Care Management/Social Worker. Management plans discussed with the patient,  family and they are in agreement.  CODE STATUS:  DNR  DVT Prophylaxis: Heparin subcutaneous  TOTAL TIME TAKING CARE OF THIS PATIENT: 30 minutes.   POSSIBLE D/C IN 1-2 DAYS, DEPENDING ON CLINICAL CONDITION.   Henreitta Leber M.D on 07/25/2016 at 11:43 AM  Between 7am to 6pm - Pager - [phone]  After 6pm go to [email or web address] - Proofreader  [facility]  Office  [phone]  CC: Primary care physician; Golden Pop, MD

## 2016-07-26 LAB — HIV ANTIBODY (ROUTINE TESTING W REFLEX): HIV SCREEN 4TH GENERATION: NONREACTIVE

## 2016-07-26 MED ORDER — LIDOCAINE VISCOUS 2 % MT SOLN: 15.0000 mL | OROMUCOSAL | 0 refills | Status: DC | PRN

## 2016-07-26 MED ORDER — NYSTATIN 100000 UNIT/ML MT SUSP: 5.0000 mL | Freq: Four times a day (QID) | OROMUCOSAL | 0 refills | Status: AC

## 2016-07-26 NOTE — Progress Notes (Signed)
[place] at [place] NAME: Stuart Spence    MR#:  [id number]  DATE OF BIRTH:  03/20/1961  SUBJECTIVE:   Still having some diarrhea but significantly improved since yesterday. No abdominal pain, nausea, vomiting.  REVIEW OF SYSTEMS:    Review of Systems  Constitutional: Negative for chills and fever.  HENT: Negative for congestion and tinnitus.   Eyes: Negative for blurred vision and double vision.  Respiratory: Negative for cough, shortness of breath and wheezing.   Cardiovascular: Negative for chest pain, orthopnea and PND.  Gastrointestinal: Positive for diarrhea. Negative for abdominal pain, nausea and vomiting.  Genitourinary: Negative for dysuria and hematuria.  Neurological: Negative for dizziness, sensory change and focal weakness.  All other systems reviewed and are negative.   Nutrition: Regular Tolerating Diet: Yes Tolerating PT: Ambulatory   DRUG ALLERGIES:  No Known Allergies  VITALS:  Blood pressure 106/72, pulse (!) 107, temperature 97.9 F (36.6 C), temperature source Oral, resp. rate 20, height '5\' 6"'$  (1.676 m), weight 103.2 kg (227 lb 8 oz), SpO2 97 %.  PHYSICAL EXAMINATION:   Physical Exam  GENERAL:  56 y.o.-year-old obese patient lying in bed in no acute distress.  EYES: Pupils equal, round, reactive to light and accommodation. No scleral icterus. Extraocular muscles intact.  HEENT: Head atraumatic, normocephalic. Oropharynx and nasopharynx clear.  NECK:  Supple, no jugular venous distention. No thyroid enlargement, no tenderness.  LUNGS: Normal breath sounds bilaterally, no wheezing, rales, rhonchi. No use of accessory muscles of respiration.  CARDIOVASCULAR: S1, S2 normal. No murmurs, rubs, or gallops.  ABDOMEN: Soft, nontender, nondistended. Bowel sounds present. No organomegaly or mass.  EXTREMITIES: No cyanosis, clubbing or edema b/l.    NEUROLOGIC: Cranial nerves II through XII are intact. No focal Motor  or sensory deficits b/l.   PSYCHIATRIC: The patient is alert and oriented x 3.  SKIN: No obvious rash, lesion, or ulcer.    LABORATORY PANEL:   CBC  Recent Labs Lab 07/25/16 0453  WBC 2.3*  HGB 9.1*  HCT 27.4*  PLT 113*   ------------------------------------------------------------------------------------------------------------------  Chemistries   Recent Labs Lab 07/24/16 1755 07/25/16 0453  NA 137 138  K 4.4 4.6  CL 101 107  CO2 29 25  GLUCOSE 92 101*  BUN 29* 24*  CREATININE 1.80* 1.84*  CALCIUM 8.7* 8.0*  AST 27  --   ALT 19  --   ALKPHOS 139*  --   BILITOT 1.1  --    ------------------------------------------------------------------------------------------------------------------  Cardiac Enzymes No results for input(s): TROPONINI in the last 168 hours. ------------------------------------------------------------------------------------------------------------------  RADIOLOGY:  No results found.   ASSESSMENT AND PLAN:   56 year old male with past medical history of lung cancer currently undergoing chemotherapy, chronic anemia, hypertension, gout, depression who presented to the hospital due to nausea vomiting and diarrhea.  1. Nausea vomiting diarrhea-secondary to chemotherapy. Patient's stool for C. difficile and comprehensive culture is negative. -Continue supportive care with fluids and improved w/ Imodium.  - advanced diet to regular today and will monitor for further diarrhea.   2. Essential hypertension-continue benazepril, Norvasc  3. History of gout-no acute attack, continue close seen as needed.  4. GERD-continue Protonix.  5. Depression-continue fluoxetine.  6. Pancytopenia-secondary to chemotherapy. No acute need for transfusion.  - follow counts.   7. History of lung cancer currently undergoing chemotherapy-continue follow-up with oncology as an outpatient.  8. Mucositis/Oral ulcers - due to chemo.  - cont. Viscous Lidocaine,  Nystatin.   If  doing well then d/c home tomorrow.   All the records are reviewed and case discussed with Care Management/Social Worker. Management plans discussed with the patient, family and they are in agreement.  CODE STATUS: DNR  DVT Prophylaxis: Heparin subcutaneous  TOTAL TIME TAKING CARE OF THIS PATIENT: 25 minutes.   POSSIBLE D/C IN 1-2 DAYS, DEPENDING ON CLINICAL CONDITION.   Henreitta Leber M.D on 07/26/2016 at 4:45 PM  Between 7am to 6pm - Pager - [phone]  After 6pm go to [email or web address] - Proofreader  [facility]  Office  [phone]  CC: Primary care physician; Golden Pop, MD

## 2016-07-27 LAB — CBC
HEMATOCRIT: 29.1 % — AB (ref 40.0–52.0)
Hemoglobin: 9.6 g/dL — ABNORMAL LOW (ref 13.0–18.0)
MCH: 35.2 pg — ABNORMAL HIGH (ref 26.0–34.0)
MCHC: 33.1 g/dL (ref 32.0–36.0)
MCV: 106.3 fL — ABNORMAL HIGH (ref 80.0–100.0)
PLATELETS: 137 10*3/uL — AB (ref 150–440)
RBC: 2.74 MIL/uL — AB (ref 4.40–5.90)
RDW: 16.8 % — AB (ref 11.5–14.5)
WBC: 10.7 10*3/uL — ABNORMAL HIGH (ref 3.8–10.6)

## 2016-07-27 LAB — BASIC METABOLIC PANEL
ANION GAP: 6 (ref 5–15)
BUN: 12 mg/dL (ref 6–20)
CALCIUM: 8.3 mg/dL — AB (ref 8.9–10.3)
CO2: 29 mmol/L (ref 22–32)
Chloride: 111 mmol/L (ref 101–111)
Creatinine, Ser: 2.02 mg/dL — ABNORMAL HIGH (ref 0.61–1.24)
GFR, EST AFRICAN AMERICAN: 41 mL/min — AB (ref >60)
GFR, EST NON AFRICAN AMERICAN: 35 mL/min — AB (ref >60)
Glucose, Bld: 96 mg/dL (ref 65–99)
POTASSIUM: 5 mmol/L (ref 3.5–5.1)
Sodium: 146 mmol/L — ABNORMAL HIGH (ref 135–145)

## 2016-07-27 NOTE — Progress Notes (Signed)
Pt is d/ced home.  He's had no diarrhea since early last night.  Also no nausea or vomiting.  WBC improved from 2.3 to 10.7.  Reviewed f/u appts.  IV was removed by nursing student.  Recommended to patient that he have a palliative consult at the [facility] for symptom management while undergoing treatment.  Should be able to manage diarrhea, nausea and vomiting.  Patient will go home with family.

## 2016-07-27 NOTE — Discharge Summary (Signed)
[facility] NAME: Stuart Spence    MR#:  [id number]  DATE OF BIRTH:  02-12-1961  DATE OF ADMISSION:  07/24/2016 ADMITTING PHYSICIAN: Demetrios Loll, MD  DATE OF DISCHARGE: 07/27/2016  1:49 PM  PRIMARY CARE PHYSICIAN: Golden Pop, MD    ADMISSION DIAGNOSIS:  Nausea vomiting and diarrhea [R11.2, R19.7]  DISCHARGE DIAGNOSIS:  Active Problems:   Nausea and vomiting   SECONDARY DIAGNOSIS:   Past Medical History:  Diagnosis Date  . Cancer ([place])    Lung  . Depression   . ED (erectile dysfunction)   . Gout   . Hypertension   . On home oxygen therapy   . Personal history of chemotherapy    PT TAKING CHEMO EVERY 3 WEEKS    HOSPITAL COURSE:   56 year old male with past medical history of lung cancer currently undergoing chemotherapy, chronic anemia, hypertension, gout, depression who presented to the hospital due to nausea vomiting and diarrhea.  1. Nausea vomiting diarrhea-secondary to chemotherapy. Patient's stool for C. difficile and comprehensive culture was negative. -Patient was treated supportively with IV fluids, antibiotics and also scheduled Imodium. Patient's nausea vomiting and diarrhea has not improved and prematurity resolved. He is tolerating a regular diet well and is being discharged. -he will continue Imodium as needed for his diarrhea.  2. Essential hypertension-he will continue benazepril, Norvasc - BP remained stable while in hospital  3. History of gout-no acute attack, he will cont. His Colchicine as outpatient.   4. GERD- he will continue Protonix.  5. Depression- he will continue fluoxetine.  6. Pancytopenia-secondary to chemotherapy. No acute need for transfusion.  - follow counts as outpatient.   7. History of lung cancer currently undergoing chemotherapy-continue follow-up with oncology as an outpatient.  8. Mucositis/Oral ulcers - due to chemo.  - he will cont. Viscous Lidocaine, Nystatin  and was discharged on it.    DISCHARGE CONDITIONS:   Stable.   CONSULTS OBTAINED:    DRUG ALLERGIES:  No Known Allergies  DISCHARGE MEDICATIONS:   Allergies as of 07/27/2016   No Known Allergies     Medication List    STOP taking these medications   telmisartan 40 MG tablet Commonly known as:  MICARDIS     TAKE these medications   albuterol 108 (90 Base) MCG/ACT inhaler Commonly known as:  PROVENTIL HFA;VENTOLIN HFA Inhale 1-2 puffs into the lungs See admin instructions. Reported on 09/12/2015   amLODipine 5 MG tablet Commonly known as:  NORVASC Take 1 tablet (5 mg total) by mouth daily.   benazepril 40 MG tablet Commonly known as:  LOTENSIN Take 40 mg by mouth daily.   CALCIUM 600 PO Take 1,200 mg by mouth 2 (two) times daily.   colchicine 0.6 MG tablet Take 0.6 mg by mouth daily as needed (two pills by mouth at onset, the one pill one hour later, maximum three pills per gout flare).   DELSYM PO Take by mouth.   dexamethasone 4 MG tablet Commonly known as:  DECADRON Take 2 tablets (8 mg total) by mouth 2 (two) times daily. Start the day before Taxotere. Then daily after chemo for 2 days.   FLUoxetine 20 MG capsule Commonly known as:  PROZAC Take 2 capsules (40 mg total) by mouth every morning.   folic acid 1 MG tablet Commonly known as:  FOLVITE Take 1 tablet (1 mg total) by mouth daily.   lidocaine 2 % solution Commonly known as:  XYLOCAINE Use  as directed 15 mLs in the mouth or throat every 4 (four) hours as needed for mouth pain.   lidocaine-prilocaine cream Commonly known as:  EMLA Apply 1 application topically as needed.   nystatin 100000 UNIT/ML suspension Commonly known as:  MYCOSTATIN Use as directed 5 mLs (500,000 Units total) in the mouth or throat 4 (four) times daily.   nystatin powder Commonly known as:  nystatin Apply topically 3 (three) times daily as needed.   ondansetron 8 MG tablet Commonly known as:  ZOFRAN Take 1 tablet (8  mg total) by mouth every 8 (eight) hours as needed for nausea or vomiting.   oxyCODONE 5 MG immediate release tablet Commonly known as:  ROXICODONE Take 1 tablet (5 mg total) by mouth every 8 (eight) hours as needed.   OXYGEN Inhale 2 L into the lungs continuous. Reported on 09/12/2015   pantoprazole 40 MG tablet Commonly known as:  PROTONIX Take 1 tablet (40 mg total) by mouth daily.   sildenafil 100 MG tablet Commonly known as:  VIAGRA Take 1 tablet (100 mg total) by mouth daily as needed for erectile dysfunction.         DISCHARGE INSTRUCTIONS:   DIET:  Regular diet  DISCHARGE CONDITION:  Stable  ACTIVITY:  Activity as tolerated  OXYGEN:  Home Oxygen: No.   Oxygen Delivery: room air  DISCHARGE LOCATION:  home   If you experience worsening of your admission symptoms, develop shortness of breath, life threatening emergency, suicidal or homicidal thoughts you must seek medical attention immediately by calling 911 or calling your MD immediately  if symptoms less severe.  You Must read complete instructions/literature along with all the possible adverse reactions/side effects for all the Medicines you take and that have been prescribed to you. Take any new Medicines after you have completely understood and accpet all the possible adverse reactions/side effects.   Please note  You were cared for by a hospitalist during your hospital stay. If you have any questions about your discharge medications or the care you received while you were in the hospital after you are discharged, you can call the unit and asked to speak with the hospitalist on call if the hospitalist that took care of you is not available. Once you are discharged, your primary care physician will handle any further medical issues. Please note that NO REFILLS for any discharge medications will be authorized once you are discharged, as it is imperative that you return to your primary care physician (or establish  a relationship with a primary care physician if you do not have one) for your aftercare needs so that they can reassess your need for medications and monitor your lab values.     Today   Tolerating regular diet well. Diarrhea improved. WBC count improved. Family at bedside.  Will d/c home today  VITAL SIGNS:  Blood pressure 111/69, pulse (!) 108, temperature 98 F (36.7 C), temperature source Oral, resp. rate 20, height '5\' 6"'$  (1.676 m), weight 103.2 kg (227 lb 8 oz), SpO2 91 %.  I/O:   Intake/Output Summary (Last 24 hours) at 07/27/16 1658 Last data filed at 07/27/16 0900  Gross per 24 hour  Intake              120 ml  Output              850 ml  Net             -730 ml    PHYSICAL EXAMINATION:  GENERAL:  56 y.o.-year-old obese patient lying in bed in no acute distress.  EYES: Pupils equal, round, reactive to light and accommodation. No scleral icterus. Extraocular muscles intact.  HEENT: Head atraumatic, normocephalic. Oropharynx and nasopharynx clear.  NECK:  Supple, no jugular venous distention. No thyroid enlargement, no tenderness.  LUNGS: Normal breath sounds bilaterally, no wheezing, rales, rhonchi. No use of accessory muscles of respiration.  CARDIOVASCULAR: S1, S2 normal. No murmurs, rubs, or gallops.  ABDOMEN: Soft, nontender, nondistended. Bowel sounds present. No organomegaly or mass.  EXTREMITIES: No cyanosis, clubbing or edema b/l.    NEUROLOGIC: Cranial nerves II through XII are intact. No focal Motor or sensory deficits b/l.   PSYCHIATRIC: The patient is alert and oriented x 3.  SKIN: No obvious rash, lesion, or ulcer.   DATA REVIEW:   CBC  Recent Labs Lab 07/27/16 0717  WBC 10.7*  HGB 9.6*  HCT 29.1*  PLT 137*    Chemistries   Recent Labs Lab 07/24/16 1755  07/27/16 0717  NA 137  < > 146*  K 4.4  < > 5.0  CL 101  < > 111  CO2 29  < > 29  GLUCOSE 92  < > 96  BUN 29*  < > 12  CREATININE 1.80*  < > 2.02*  CALCIUM 8.7*  < > 8.3*  AST 27   --   --   ALT 19  --   --   ALKPHOS 139*  --   --   BILITOT 1.1  --   --   < > = values in this interval not displayed.  Cardiac Enzymes No results for input(s): TROPONINI in the last 168 hours.  Microbiology Results  Results for orders placed or performed during the hospital encounter of 07/24/16  C difficile quick scan w PCR reflex     Status: None   Collection Time: 07/24/16 10:21 PM  Result Value Ref Range Status   C Diff antigen NEGATIVE NEGATIVE Final   C Diff toxin NEGATIVE NEGATIVE Final   C Diff interpretation No C. difficile detected.  Final  Gastrointestinal Panel by PCR , Stool     Status: None   Collection Time: 07/24/16 10:21 PM  Result Value Ref Range Status   Campylobacter species NOT DETECTED NOT DETECTED Final   Plesimonas shigelloides NOT DETECTED NOT DETECTED Final   Salmonella species NOT DETECTED NOT DETECTED Final   Yersinia enterocolitica NOT DETECTED NOT DETECTED Final   Vibrio species NOT DETECTED NOT DETECTED Final   Vibrio cholerae NOT DETECTED NOT DETECTED Final   Enteroaggregative E coli (EAEC) NOT DETECTED NOT DETECTED Final   Enteropathogenic E coli (EPEC) NOT DETECTED NOT DETECTED Final   Enterotoxigenic E coli (ETEC) NOT DETECTED NOT DETECTED Final   Shiga like toxin producing E coli (STEC) NOT DETECTED NOT DETECTED Final   Shigella/Enteroinvasive E coli (EIEC) NOT DETECTED NOT DETECTED Final   Cryptosporidium NOT DETECTED NOT DETECTED Final   Cyclospora cayetanensis NOT DETECTED NOT DETECTED Final   Entamoeba histolytica NOT DETECTED NOT DETECTED Final   Giardia lamblia NOT DETECTED NOT DETECTED Final   Adenovirus F40/41 NOT DETECTED NOT DETECTED Final   Astrovirus NOT DETECTED NOT DETECTED Final   Norovirus GI/GII NOT DETECTED NOT DETECTED Final   Rotavirus A NOT DETECTED NOT DETECTED Final   Sapovirus (I, II, IV, and V) NOT DETECTED NOT DETECTED Final    RADIOLOGY:  No results found.    Management plans discussed with the patient,  family  and they are in agreement.    Questions for Most Recent Historical Code Status (Order [id number])    Question Answer Comment   In the event of cardiac or respiratory ARREST Do not call a "code blue"    In the event of cardiac or respiratory ARREST Do not perform Intubation, CPR, defibrillation or ACLS    In the event of cardiac or respiratory ARREST Use medication by any route, position, wound care, and other measures to relive pain and suffering. May use oxygen, suction and manual treatment of airway obstruction as needed for comfort.         Advance Directive Documentation     Most Recent Value  Type of Advance Directive  Healthcare Power of Attorney, Living will  Pre-existing out of facility DNR order (yellow form or pink MOST form)  -  "MOST" Form in Place?  -      TOTAL TIME TAKING CARE OF THIS PATIENT: 40 minutes.    Henreitta Leber M.D on 07/27/2016 at 4:58 PM  Between 7am to 6pm - Pager - [phone]  After 6pm go to [email or web address] - Proofreader  [facility]  Office  [phone]  CC: Primary care physician; Golden Pop, MD

## 2016-07-29 ENCOUNTER — Inpatient Hospital Stay (HOSPITAL_BASED_OUTPATIENT_CLINIC_OR_DEPARTMENT_OTHER): Payer: No Typology Code available for payment source | Admitting: Hematology and Oncology

## 2016-07-29 ENCOUNTER — Inpatient Hospital Stay: Payer: PRIVATE HEALTH INSURANCE | Admitting: Family Medicine

## 2016-07-29 ENCOUNTER — Other Ambulatory Visit: Payer: No Typology Code available for payment source

## 2016-07-29 ENCOUNTER — Encounter: Payer: Self-pay | Admitting: Hematology and Oncology

## 2016-07-29 ENCOUNTER — Inpatient Hospital Stay: Payer: No Typology Code available for payment source

## 2016-07-29 ENCOUNTER — Ambulatory Visit
Admission: RE | Admit: 2016-07-29 | Discharge: 2016-07-29 | Disposition: A | Discharge status: Home / Self Care | Payer: No Typology Code available for payment source | Source: Ambulatory Visit | Attending: Hematology and Oncology | Admitting: Hematology and Oncology

## 2016-07-29 ENCOUNTER — Ambulatory Visit: Payer: No Typology Code available for payment source

## 2016-07-29 VITALS — BP 99/68 | HR 121 | Temp 96.0°F | Resp 18 | Wt 219.4 lb

## 2016-07-29 VITALS — BP 100/65 | HR 109

## 2016-07-29 DIAGNOSIS — D649 Anemia, unspecified: Secondary | ICD-10-CM | POA: Insufficient documentation

## 2016-07-29 DIAGNOSIS — E860 Dehydration: Secondary | ICD-10-CM

## 2016-07-29 DIAGNOSIS — R531 Weakness: Secondary | ICD-10-CM

## 2016-07-29 DIAGNOSIS — Z7189 Other specified counseling: Secondary | ICD-10-CM | POA: Insufficient documentation

## 2016-07-29 DIAGNOSIS — C771 Secondary and unspecified malignant neoplasm of intrathoracic lymph nodes: Secondary | ICD-10-CM

## 2016-07-29 DIAGNOSIS — R630 Anorexia: Secondary | ICD-10-CM

## 2016-07-29 DIAGNOSIS — R970 Elevated carcinoembryonic antigen [CEA]: Secondary | ICD-10-CM | POA: Diagnosis not present

## 2016-07-29 DIAGNOSIS — C3491 Malignant neoplasm of unspecified part of right bronchus or lung: Secondary | ICD-10-CM

## 2016-07-29 DIAGNOSIS — M109 Gout, unspecified: Secondary | ICD-10-CM

## 2016-07-29 DIAGNOSIS — J90 Pleural effusion, not elsewhere classified: Secondary | ICD-10-CM | POA: Diagnosis not present

## 2016-07-29 DIAGNOSIS — N289 Disorder of kidney and ureter, unspecified: Secondary | ICD-10-CM

## 2016-07-29 DIAGNOSIS — C7951 Secondary malignant neoplasm of bone: Secondary | ICD-10-CM | POA: Diagnosis present

## 2016-07-29 DIAGNOSIS — R634 Abnormal weight loss: Secondary | ICD-10-CM

## 2016-07-29 DIAGNOSIS — R5383 Other fatigue: Secondary | ICD-10-CM

## 2016-07-29 DIAGNOSIS — Z7952 Long term (current) use of systemic steroids: Secondary | ICD-10-CM

## 2016-07-29 DIAGNOSIS — K921 Melena: Secondary | ICD-10-CM | POA: Diagnosis not present

## 2016-07-29 DIAGNOSIS — Z9981 Dependence on supplemental oxygen: Secondary | ICD-10-CM

## 2016-07-29 DIAGNOSIS — I10 Essential (primary) hypertension: Secondary | ICD-10-CM

## 2016-07-29 DIAGNOSIS — R112 Nausea with vomiting, unspecified: Secondary | ICD-10-CM

## 2016-07-29 DIAGNOSIS — Z5111 Encounter for antineoplastic chemotherapy: Secondary | ICD-10-CM

## 2016-07-29 DIAGNOSIS — C3411 Malignant neoplasm of upper lobe, right bronchus or lung: Secondary | ICD-10-CM | POA: Diagnosis not present

## 2016-07-29 DIAGNOSIS — Z79899 Other long term (current) drug therapy: Secondary | ICD-10-CM

## 2016-07-29 DIAGNOSIS — Z77098 Contact with and (suspected) exposure to other hazardous, chiefly nonmedicinal, chemicals: Secondary | ICD-10-CM

## 2016-07-29 DIAGNOSIS — Z801 Family history of malignant neoplasm of trachea, bronchus and lung: Secondary | ICD-10-CM

## 2016-07-29 LAB — BASIC METABOLIC PANEL
Anion gap: 9 (ref 5–15)
BUN: 17 mg/dL (ref 6–20)
CO2: 28 mmol/L (ref 22–32)
Calcium: 8.8 mg/dL — ABNORMAL LOW (ref 8.9–10.3)
Chloride: 107 mmol/L (ref 101–111)
Creatinine, Ser: 2.18 mg/dL — ABNORMAL HIGH (ref 0.61–1.24)
GFR calc Af Amer: 37 mL/min — ABNORMAL LOW (ref >60)
GFR calc non Af Amer: 32 mL/min — ABNORMAL LOW (ref >60)
Glucose, Bld: 148 mg/dL — ABNORMAL HIGH (ref 65–99)
Potassium: 4.4 mmol/L (ref 3.5–5.1)
Sodium: 144 mmol/L (ref 135–145)

## 2016-07-29 LAB — CBC WITH DIFFERENTIAL/PLATELET
Basophils Absolute: 0.2 10*3/uL — ABNORMAL HIGH (ref 0–0.1)
Basophils Relative: 1 %
Eosinophils Absolute: 0 10*3/uL (ref 0–0.7)
Eosinophils Relative: 0 %
HCT: 31.8 % — ABNORMAL LOW (ref 40.0–52.0)
Hemoglobin: 10.5 g/dL — ABNORMAL LOW (ref 13.0–18.0)
Lymphocytes Relative: 7 %
Lymphs Abs: 1.3 10*3/uL (ref 1.0–3.6)
MCH: 34.8 pg — ABNORMAL HIGH (ref 26.0–34.0)
MCHC: 32.9 g/dL (ref 32.0–36.0)
MCV: 105.9 fL — ABNORMAL HIGH (ref 80.0–100.0)
Monocytes Absolute: 0.9 10*3/uL (ref 0.2–1.0)
Monocytes Relative: 5 %
Neutro Abs: 16.2 10*3/uL — ABNORMAL HIGH (ref 1.4–6.5)
Neutrophils Relative %: 87 %
Platelets: 136 10*3/uL — ABNORMAL LOW (ref 150–440)
RBC: 3 MIL/uL — ABNORMAL LOW (ref 4.40–5.90)
RDW: 17.2 % — ABNORMAL HIGH (ref 11.5–14.5)
WBC: 18.6 10*3/uL — ABNORMAL HIGH (ref 3.8–10.6)

## 2016-07-29 MED ORDER — HEPARIN SOD (PORK) LOCK FLUSH 100 UNIT/ML IV SOLN
INTRAVENOUS | Status: AC
  Filled 2016-07-29: qty 5

## 2016-07-29 MED ORDER — HEPARIN SOD (PORK) LOCK FLUSH 100 UNIT/ML IV SOLN
500.0000 [IU] | Freq: Once | INTRAVENOUS | Status: AC
  Administered 2016-07-29: 500 [IU] via INTRAVENOUS

## 2016-07-29 MED ORDER — SODIUM CHLORIDE 0.9 % IV SOLN
Freq: Once | INTRAVENOUS | Status: AC
  Administered 2016-07-29: 11:00:00 via INTRAVENOUS
  Filled 2016-07-29: qty 1000

## 2016-07-29 MED ORDER — LORAZEPAM 0.5 MG PO TABS: 0.5000 mg | ORAL_TABLET | Freq: Four times a day (QID) | ORAL | 0 refills | Status: AC | PRN

## 2016-07-29 MED ORDER — SODIUM CHLORIDE 0.9 % IV SOLN
Freq: Once | INTRAVENOUS | Status: AC
  Administered 2016-07-29: 10:00:00 via INTRAVENOUS
  Filled 2016-07-29: qty 1000

## 2016-07-29 NOTE — Progress Notes (Signed)
Since last treatment you developed thrush in his mouth.  States the treatment really made him feel bad.  Patient presents today in wheelchair.  BP low.  99/68 HR 121.  Unable to get BP with patient standing.  Tried X 2 and it will not register. Patient states he had nausea and vomiting this morning prior to coming to clinic. No complaints of pain.  Skin: pallor.

## 2016-07-29 NOTE — Progress Notes (Signed)
[facility] day:  07/29/2016   Chief Complaint: Wallie Lagrand is a 56 y.o. male with metastatic lung cancer who is seen for nadir assessment on day 11 of cycle #1 Taxotere prior to monthly Xgeva.  HPI:  The patient was last seen in the medical oncology clinic on 07/19/2016.  At that time, he denied any complaint.  He began Taxotere with Neulasta support.  Ramucirumab was held secondary to recent history of rectal bleeding.   Labs at last visit included a creatinine 2.16.  Renal ultrasound was ordered.  Nephrology was consulted.  Hemoglobin had decreased from 11.8 to 10.5.  Iron studies revealed a ferritin of 1116, iron saturation of 40% and a TIBC of 265.  He was admitted to [facility] from 07/24/2016 - 07/27/2016 with nausea and vomiting.  He received IVF and anti-emetics.  In addition, he had diarrhea and mucosisitis.  Stool was negative for C diff.  He received Imodium.  He was treated with magic mouthwash.  Symptomatically, he has had some nausea and vomiting. He feels weak when standing. He took Zofran this morning. He is not eating or drinking much. Nothing tastes good. His had no diarrhea since 07/29/2016.  Last episode of blood in stool on 07/23/2016.  He notes no energy. He has been "laying around".   Past Medical History:  Diagnosis Date  . Cancer ([place])    Lung  . Depression   . ED (erectile dysfunction)   . Gout   . Hypertension   . On home oxygen therapy   . Personal history of chemotherapy    PT TAKING CHEMO EVERY 3 WEEKS    Past Surgical History:  Procedure Laterality Date  . CHEST TUBE INSERTION Left 08/13/2015   Procedure: CHEST TUBE INSERTION;  Surgeon: Nestor Lewandowsky, MD;  Location: [facility] ORS;  Service: General;  Laterality: Left;  . CHEST TUBE INSERTION N/A 09/18/2015   Procedure: PLEURX CATH REMOVAL;  Surgeon: Nestor Lewandowsky, MD;  Location: [facility] ORS;  Service: Thoracic;  Laterality: N/A;  . PORTACATH PLACEMENT Left 08/13/2015    Procedure: INSERTION PORT-A-CATH;  Surgeon: Nestor Lewandowsky, MD;  Location: [facility] ORS;  Service: General;  Laterality: Left;  . PORTACATH PLACEMENT    . TONSILLECTOMY      Family History  Problem Relation Age of Onset  . Cancer Mother 45    lung  . Heart attack Father 35  . Hypertension Father   . Congestive Heart Failure Father 66    died from  . Diabetes Sister     Social History:  reports that he has never smoked. He has never used smokeless tobacco. He reports that he does not drink alcohol or use drugs.  He has worked 83 years in the [employer].He notes exposure to chemicals.   He recently switched jobs.  He is not working.  He had previously planned on helping to build 2 houses.  Patient's wife name is [name].  He is accompanied by his wife today.  Allergies: No Known Allergies  Current Medications: Current Outpatient Prescriptions  Medication Sig Dispense Refill  . albuterol (PROVENTIL HFA;VENTOLIN HFA) 108 (90 Base) MCG/ACT inhaler Inhale 1-2 puffs into the lungs See admin instructions. Reported on 09/12/2015    . amLODipine (NORVASC) 5 MG tablet Take 1 tablet (5 mg total) by mouth daily. 30 tablet 12  . benazepril (LOTENSIN) 40 MG tablet Take 40 mg by mouth daily.    . Calcium Carbonate (CALCIUM 600 PO) Take 1,200  mg by mouth 2 (two) times daily.    . colchicine 0.6 MG tablet Take 0.6 mg by mouth daily as needed (two pills by mouth at onset, the one pill one hour later, maximum three pills per gout flare).    Marland Kitchen dexamethasone (DECADRON) 4 MG tablet Take 2 tablets (8 mg total) by mouth 2 (two) times daily. Start the day before Taxotere. Then daily after chemo for 2 days. 30 tablet 1  . Dextromethorphan Polistirex (DELSYM PO) Take by mouth.    Marland Kitchen FLUoxetine (PROZAC) 20 MG capsule Take 2 capsules (40 mg total) by mouth every morning. 60 capsule 12  . folic acid (FOLVITE) 1 MG tablet Take 1 tablet (1 mg total) by mouth daily. 30 tablet 3  . lidocaine (XYLOCAINE) 2 % solution Use as  directed 15 mLs in the mouth or throat every 4 (four) hours as needed for mouth pain. 100 mL 0  . lidocaine-prilocaine (EMLA) cream Apply 1 application topically as needed. 30 g 2  . nystatin (MYCOSTATIN) 100000 UNIT/ML suspension Use as directed 5 mLs (500,000 Units total) in the mouth or throat 4 (four) times daily. 60 mL 0  . nystatin (NYSTATIN) powder Apply topically 3 (three) times daily as needed. 30 g 1  . ondansetron (ZOFRAN) 8 MG tablet Take 1 tablet (8 mg total) by mouth every 8 (eight) hours as needed for nausea or vomiting. 20 tablet 3  . oxyCODONE (ROXICODONE) 5 MG immediate release tablet Take 1 tablet (5 mg total) by mouth every 8 (eight) hours as needed. 20 tablet 0  . OXYGEN Inhale 2 L into the lungs continuous. Reported on 09/12/2015    . pantoprazole (PROTONIX) 40 MG tablet Take 1 tablet (40 mg total) by mouth daily. 30 tablet 1  . sildenafil (VIAGRA) 100 MG tablet Take 1 tablet (100 mg total) by mouth daily as needed for erectile dysfunction. 10 tablet 12   No current facility-administered medications for this visit.     Review of Systems:  GENERAL: Feels weak. No energy.  No fevers or sweats.  Weight down 14 pounds since last visit. PERFORMANCE STATUS (ECOG): 1 HEENT: Thrush.  Cotton mouth with thick secretions.  Mouth sore.  No visual changes or runny nose. Lungs: No shortness of breath or cough. No hemoptysis.   Cardiac: No chest pain, palpitations, orthopnea, or PND. GI: Nausea and vomiting.  No diarrhea, constipation, melena or hematochezia. Rectal bleeding on 07/23/2016.  No prior colonoscopy. GU: No urgency, frequency, dysuria, or hematuria.  Musculoskeletal: No back pain. No joint pain. No muscle tenderness. Extremities: No pain or swelling. Skin: No rashes or ulcers. Neuro: No headache, numbness or weakness, balance or coordination issues. Endocrine: No diabetes, thyroid issues, hot flashes or night sweats. Psych: No mood changes, depression or  anxiety. Pain: No focal pain. Review of systems: All other systems reviewed and found to be negative.  Physical Exam: Blood pressure 99/68, pulse (!) 121, temperature (!) 96 F (35.6 C), temperature source Tympanic, resp. rate 18, weight 219 lb 7 oz (99.5 kg). GENERAL: Fatigued appearing heavyset gentleman sitting in a wheelchair in the exam room in no acute distress. MENTAL STATUS: Alert and oriented to person, place and time. HEAD: Short dark hair. Goatee. Normocephalic, atraumatic, face symmetric, no Cushingoid features. EYES: Oval glasses. Pupils equal round and reactive to light and accomodation. No conjunctivitis or scleral icterus. ENT: Oropharynx with thick secretions.  No mucositis or thrush. Tongue normal. Mucous membranes moist.  RESPIRATORY: Clear to auscultation without rales,  wheezes or rhonchi. CARDIOVASCULAR: Regular rate and rhythm without murmur, rub or gallop. ABDOMEN: Soft, non-tender, with active bowel sounds, and no hepatosplenomegaly. No masses. SKIN: No rashes or ulcers. EXTREMITIES: Mild chronic lower extremity changes.  No skin discoloration or tenderness. No palpable cords. LYMPH NODES: No palpable cervical, supraclavicular, axillary or inguinal adenopathy  NEUROLOGICAL: Unremarkable. PSYCH: Appropriate.   Appointment on 07/29/2016  Component Date Value Ref Range Status  . Sodium 07/29/2016 144  135 - 145 mmol/L Final  . Potassium 07/29/2016 4.4  3.5 - 5.1 mmol/L Final  . Chloride 07/29/2016 107  101 - 111 mmol/L Final  . CO2 07/29/2016 28  22 - 32 mmol/L Final  . Glucose, Bld 07/29/2016 148* 65 - 99 mg/dL Final  . BUN 07/29/2016 17  6 - 20 mg/dL Final  . Creatinine, Ser 07/29/2016 2.18* 0.61 - 1.24 mg/dL Final  . Calcium 07/29/2016 8.8* 8.9 - 10.3 mg/dL Final  . GFR calc non Af Amer 07/29/2016 32* >60 mL/min Final  . GFR calc Af Amer 07/29/2016 37* >60 mL/min Final   Comment: (NOTE) The eGFR has been calculated using the CKD EPI  equation. This calculation has not been validated in all clinical situations. eGFR's persistently <60 mL/min signify possible Chronic Kidney Disease.   . Anion gap 07/29/2016 9  5 - 15 Final  . WBC 07/29/2016 PENDING  4.0 - 10.5 K/uL Incomplete  . RBC 07/29/2016 3.00* 4.40 - 5.90 MIL/uL Final  . Hemoglobin 07/29/2016 10.5* 13.0 - 18.0 g/dL Final  . HCT 07/29/2016 31.8* 40.0 - 52.0 % Final  . MCV 07/29/2016 105.9* 80.0 - 100.0 fL Final  . MCH 07/29/2016 34.8* 26.0 - 34.0 pg Final  . MCHC 07/29/2016 32.9  32.0 - 36.0 g/dL Final  . RDW 07/29/2016 17.2* 11.5 - 14.5 % Final  . Platelets 07/29/2016 136* 150 - 440 K/uL Final  . Neutrophils Relative % 07/29/2016 PENDING  % Incomplete  . Neutro Abs 07/29/2016 PENDING  1.7 - 7.7 K/uL Incomplete  . Band Neutrophils 07/29/2016 PENDING  % Incomplete  . Lymphocytes Relative 07/29/2016 PENDING  % Incomplete  . Lymphs Abs 07/29/2016 PENDING  0.7 - 4.0 K/uL Incomplete  . Monocytes Relative 07/29/2016 PENDING  % Incomplete  . Monocytes Absolute 07/29/2016 PENDING  0.1 - 1.0 K/uL Incomplete  . Eosinophils Relative 07/29/2016 PENDING  % Incomplete  . Eosinophils Absolute 07/29/2016 PENDING  0.0 - 0.7 K/uL Incomplete  . Basophils Relative 07/29/2016 PENDING  % Incomplete  . Basophils Absolute 07/29/2016 PENDING  0.0 - 0.1 K/uL Incomplete  . WBC Morphology 07/29/2016 PENDING   Incomplete  . RBC Morphology 07/29/2016 PENDING   Incomplete  . Smear Review 07/29/2016 PENDING   Incomplete  . Other 07/29/2016 PENDING  % Incomplete  . nRBC 07/29/2016 PENDING  0 /100 WBC Incomplete  . Metamyelocytes Relative 07/29/2016 PENDING  % Incomplete  . Myelocytes 07/29/2016 PENDING  % Incomplete  . Promyelocytes Absolute 07/29/2016 PENDING  % Incomplete  . Blasts 07/29/2016 PENDING  % Incomplete  Admission on 07/24/2016, Discharged on 07/27/2016  Component Date Value Ref Range Status  . WBC 07/24/2016 4.7  3.8 - 10.6 K/uL Final  . RBC 07/24/2016 3.20* 4.40 - 5.90  MIL/uL Final  . Hemoglobin 07/24/2016 11.2* 13.0 - 18.0 g/dL Final  . HCT 07/24/2016 33.7* 40.0 - 52.0 % Final  . MCV 07/24/2016 105.3* 80.0 - 100.0 fL Final  . MCH 07/24/2016 35.2* 26.0 - 34.0 pg Final  . MCHC 07/24/2016 33.4  32.0 - 36.0 g/dL  Final  . RDW 07/24/2016 16.5* 11.5 - 14.5 % Final  . Platelets 07/24/2016 131* 150 - 440 K/uL Final  . Neutrophils Relative % 07/24/2016 71  % Final  . Lymphocytes Relative 07/24/2016 19  % Final  . Monocytes Relative 07/24/2016 7  % Final  . Eosinophils Relative 07/24/2016 2  % Final  . Basophils Relative 07/24/2016 1  % Final  . Neutro Abs 07/24/2016 3.4  1.4 - 6.5 K/uL Final  . Lymphs Abs 07/24/2016 0.9* 1.0 - 3.6 K/uL Final  . Monocytes Absolute 07/24/2016 0.3  0.2 - 1.0 K/uL Final  . Eosinophils Absolute 07/24/2016 0.1  0 - 0.7 K/uL Final  . Basophils Absolute 07/24/2016 0.0  0 - 0.1 K/uL Final  . Smear Review 07/24/2016 MORPHOLOGY UNREMARKABLE   Final  . C Diff antigen 07/24/2016 NEGATIVE  NEGATIVE Final  . C Diff toxin 07/24/2016 NEGATIVE  NEGATIVE Final  . C Diff interpretation 07/24/2016 No C. difficile detected.   Final  . Campylobacter species 07/24/2016 NOT DETECTED  NOT DETECTED Final  . Plesimonas shigelloides 07/24/2016 NOT DETECTED  NOT DETECTED Final  . Salmonella species 07/24/2016 NOT DETECTED  NOT DETECTED Final  . Yersinia enterocolitica 07/24/2016 NOT DETECTED  NOT DETECTED Final  . Vibrio species 07/24/2016 NOT DETECTED  NOT DETECTED Final  . Vibrio cholerae 07/24/2016 NOT DETECTED  NOT DETECTED Final  . Enteroaggregative E coli (EAEC) 07/24/2016 NOT DETECTED  NOT DETECTED Final  . Enteropathogenic E coli (EPEC) 07/24/2016 NOT DETECTED  NOT DETECTED Final  . Enterotoxigenic E coli (ETEC) 07/24/2016 NOT DETECTED  NOT DETECTED Final  . Shiga like toxin producing E coli * 07/24/2016 NOT DETECTED  NOT DETECTED Final  . Shigella/Enteroinvasive E coli (EI* 07/24/2016 NOT DETECTED  NOT DETECTED Final  . Cryptosporidium  07/24/2016 NOT DETECTED  NOT DETECTED Final  . Cyclospora cayetanensis 07/24/2016 NOT DETECTED  NOT DETECTED Final  . Entamoeba histolytica 07/24/2016 NOT DETECTED  NOT DETECTED Final  . Giardia lamblia 07/24/2016 NOT DETECTED  NOT DETECTED Final  . Adenovirus F40/41 07/24/2016 NOT DETECTED  NOT DETECTED Final  . Astrovirus 07/24/2016 NOT DETECTED  NOT DETECTED Final  . Norovirus GI/GII 07/24/2016 NOT DETECTED  NOT DETECTED Final  . Rotavirus A 07/24/2016 NOT DETECTED  NOT DETECTED Final  . Sapovirus (I, II, IV, and V) 07/24/2016 NOT DETECTED  NOT DETECTED Final  . Sodium 07/24/2016 137  135 - 145 mmol/L Final  . Potassium 07/24/2016 4.4  3.5 - 5.1 mmol/L Final  . Chloride 07/24/2016 101  101 - 111 mmol/L Final  . CO2 07/24/2016 29  22 - 32 mmol/L Final  . Glucose, Bld 07/24/2016 92  65 - 99 mg/dL Final  . BUN 07/24/2016 29* 6 - 20 mg/dL Final  . Creatinine, Ser 07/24/2016 1.80* 0.61 - 1.24 mg/dL Final  . Calcium 07/24/2016 8.7* 8.9 - 10.3 mg/dL Final  . Total Protein 07/24/2016 7.3  6.5 - 8.1 g/dL Final  . Albumin 07/24/2016 3.5  3.5 - 5.0 g/dL Final  . AST 07/24/2016 27  15 - 41 U/L Final  . ALT 07/24/2016 19  17 - 63 U/L Final  . Alkaline Phosphatase 07/24/2016 139* 38 - 126 U/L Final  . Total Bilirubin 07/24/2016 1.1  0.3 - 1.2 mg/dL Final  . GFR calc non Af Amer 07/24/2016 41* >60 mL/min Final  . GFR calc Af Amer 07/24/2016 47* >60 mL/min Final   Comment: (NOTE) The eGFR has been calculated using the CKD EPI equation. This calculation has  not been validated in all clinical situations. eGFR's persistently <60 mL/min signify possible Chronic Kidney Disease.   . Anion gap 07/24/2016 7  5 - 15 Final  . Lipase 07/24/2016 33  11 - 51 U/L Final  . HIV Screen 4th Generation wRfx 07/25/2016 Non Reactive  Non Reactive Final   Comment: (NOTE) Performed At: [facility] [place], [place] [id number] Lindon Romp MD FB:[phone]   . Sodium 07/25/2016 138  135  - 145 mmol/L Final  . Potassium 07/25/2016 4.6  3.5 - 5.1 mmol/L Final  . Chloride 07/25/2016 107  101 - 111 mmol/L Final  . CO2 07/25/2016 25  22 - 32 mmol/L Final  . Glucose, Bld 07/25/2016 101* 65 - 99 mg/dL Final  . BUN 07/25/2016 24* 6 - 20 mg/dL Final  . Creatinine, Ser 07/25/2016 1.84* 0.61 - 1.24 mg/dL Final  . Calcium 07/25/2016 8.0* 8.9 - 10.3 mg/dL Final  . GFR calc non Af Amer 07/25/2016 40* >60 mL/min Final  . GFR calc Af Amer 07/25/2016 46* >60 mL/min Final   Comment: (NOTE) The eGFR has been calculated using the CKD EPI equation. This calculation has not been validated in all clinical situations. eGFR's persistently <60 mL/min signify possible Chronic Kidney Disease.   . Anion gap 07/25/2016 6  5 - 15 Final  . WBC 07/25/2016 2.3* 3.8 - 10.6 K/uL Final  . RBC 07/25/2016 2.61* 4.40 - 5.90 MIL/uL Final  . Hemoglobin 07/25/2016 9.1* 13.0 - 18.0 g/dL Final  . HCT 07/25/2016 27.4* 40.0 - 52.0 % Final  . MCV 07/25/2016 105.1* 80.0 - 100.0 fL Final  . MCH 07/25/2016 34.7* 26.0 - 34.0 pg Final  . MCHC 07/25/2016 33.1  32.0 - 36.0 g/dL Final  . RDW 07/25/2016 16.1* 11.5 - 14.5 % Final  . Platelets 07/25/2016 113* 150 - 440 K/uL Final  . WBC 07/27/2016 10.7* 3.8 - 10.6 K/uL Final  . RBC 07/27/2016 2.74* 4.40 - 5.90 MIL/uL Final  . Hemoglobin 07/27/2016 9.6* 13.0 - 18.0 g/dL Final  . HCT 07/27/2016 29.1* 40.0 - 52.0 % Final  . MCV 07/27/2016 106.3* 80.0 - 100.0 fL Final  . MCH 07/27/2016 35.2* 26.0 - 34.0 pg Final  . MCHC 07/27/2016 33.1  32.0 - 36.0 g/dL Final  . RDW 07/27/2016 16.8* 11.5 - 14.5 % Final  . Platelets 07/27/2016 137* 150 - 440 K/uL Final  . Sodium 07/27/2016 146* 135 - 145 mmol/L Final  . Potassium 07/27/2016 5.0  3.5 - 5.1 mmol/L Final  . Chloride 07/27/2016 111  101 - 111 mmol/L Final  . CO2 07/27/2016 29  22 - 32 mmol/L Final  . Glucose, Bld 07/27/2016 96  65 - 99 mg/dL Final  . BUN 07/27/2016 12  6 - 20 mg/dL Final  . Creatinine, Ser 07/27/2016 2.02*  0.61 - 1.24 mg/dL Final  . Calcium 07/27/2016 8.3* 8.9 - 10.3 mg/dL Final  . GFR calc non Af Amer 07/27/2016 35* >60 mL/min Final  . GFR calc Af Amer 07/27/2016 41* >60 mL/min Final   Comment: (NOTE) The eGFR has been calculated using the CKD EPI equation. This calculation has not been validated in all clinical situations. eGFR's persistently <60 mL/min signify possible Chronic Kidney Disease.   . Anion gap 07/27/2016 6  5 - 15 Final    Assessment:  Zebulun Deman is a 56 y.o. male with stage IV adenocarcinoma of the right lung.  He has no smoking history.  He presented with with a large  right sided pleural effusion and an ill-defined 4.5 x 4.5 cm mass in the right upper lobe. Thoracentesis x 2 of approximately 4.7 liters of blood fluid revealed adenocarcinoma. TTF-1 and Napsin A were immunoreactive and consistent with a lung primary. Pleur-X catheter was placed on 08/13/2015 (removed 09/18/2015).  [facility] testing on 08/18/2015 was negative for:  EGFR, ALK, ROS1 and RET.  Foundation One testing revealed ERBB2 amplification equivocal, RICTOR amplification, APC [id number], CDKN2A/B loss, microsatellite stable, tumor mutation burden intermediate (6 Muts/Mb).  EGFR, K-RAS, ALK, BRAF, MET and RET were negative.  Chest CT angiogram on 08/10/2015 revealed and ill-defined 4.5 x 4.5 cm area of hypodensity in the right upper lobe with small areas of air bronchogram. This was incompletely characterized but is concerning for a centrally obstructing mass. There was complete occlusion of the right upper, right middle, and right lower lobe bronchi with complete collapse of the right lung. There was a large right pleural effusion. There was a lytic lesion involving the right fourth rib compatible with metastatic disease. There was no CT evidence of pulmonary embolism.  PET scan on 08/20/2015 revealed a hypermetabolic 5.2 cm medial right upper lobe lung mass, consistent with primary bronchogenic carcinoma,  with a broad attachment to the medial right upper lobe pleura.  There was interlobular septal thickening throughout the right lung suggested a component of lymphangitic tumor.  There was a malignant small right pleural effusion with hypermetabolism throughout the right pleural space.  There was hypermetabolic ipsilateral hilar, subcarinal and ipsilateral mediastinal nodal metastases.  There was extensive hypermetabolic lytic osseous metastases throughout the axial and proximal appendicular skeleton.    Regarding the skeleton, there were numerous hypermetabolic faintly lytic osseous metastases in the left humeral head (SUV 6.8), right acromion, bilateral ribs most prominent in the anterior right fourth rib  (SUV 16.0), thoracolumbar spine (T11 vertebral body with SUV 9.4), bilateral iliac bones (medial right iliac bone with SUV 12.1 and left anterior acetabulum with SUV 10.0), and right proximal femur (SUV 10.0 in the region of the right lesser trochanter).   Plain films on 08/21/2015 revealed a left humerus lytic lesion with some thinning and some absence of the cortical margin.  He received 800 cGy to the left humerus on 09/17/2015.  CEA was 837.1 on 09/12/2015, 448 on 10/10/2015, 346 on 10/31/2015, 261.3 on 11/21/2015, 726.2 on 01/02/2016, 857.2 on 01/09/2016, 1458.0 on 01/23/2016, 1350.0 on 01/30/2016, 999.6 on 02/13/2016, 870.8 on 03/04/2016, 671.9 on 03/25/2016, 587.6 on 04/16/2016, 510.6 on 05/07/2016, 512.5 on 05/27/2016, 504.1 on 05/28/2016, 596.3 on 06/17/2016, and 853.1 on 07/08/2016.  Anemia work-up on 09/04/2015 revealed a ferritin (519), iron saturation (18%), B12 (3372), and folate (21.9).  He received 4 cycles of Keytruda, carboplatin and Alimta (08/22/2015 - 10/31/2015).   He received 3 cycles of single agent Keytruda (11/21/2015 - 01/02/2016).  Pleur-X catheter was removed on 09/18/2015.    He received 6 additional cycles of carboplatin and Alimta (01/23/2016 -  05/07/2016).  He has  received 4 cycles of Avastin (03/05/2016 - 05/07/2016).    He received 2 cycles of maintenance Alimta and Avastin (05/28/2016 - 06/18/2016).  He receives B12 every 9 weeks (last 06/18/2016).  He receives monthly Xgeva (began 08/22/2015; last 05/28/2016).   PET scan on 11/20/2015 revealed a marked positive response to therapy.  There was decrease in size and metabolic activity of RIGHT perihilar mass, resolution of mediastinal nodal metabolic activity, and marked decrease in size and metabolic activity of RIGHT anterior chest wall metastasis.  There was  decreased in metabolic activity of multiple skeletal metastasis.    PET scan on 01/16/2016 revealed a marked interval progression of disease compared with previous exam.  There was progression of multifocal hypermetabolic bone metastases.  There was the interval enlargement and right lung perihilar mass with development of pleural spread of tumor and lymphangitic spread of tumor within the right lung.  There was new hypermetabolic and enlarged sub-carinal lymph node.  PET scan on 05/05/2016 revealed a positive response to therapy with some regression of the right upper lobe mass, and decreasing lymphangitic spread of disease in the right lung, decreasing malignant right pleural effusion, and a mixed response of osseous lesions (more regression than progression).  A lesion in the posterior aspect of T11 measured 3.0 x 3.4 cm (previously 2.2 x 2.4 cm), but was no longer hypermetabolic.  The largest persistent lesion hypermetabolic lesions are in the pelvis in the posterior aspect of the left ilium and in the right side of the sacrum, are more lytic and sclerotic in appearance, with the largest of these lesions in the right side of the sacrum measuring up to 2.7 cm (SUV 10.5).  PET scan on 07/08/2016 revealed worsening metastatic disease, with a significant increase in osseous metastatic tumor burden and innumerable new bony lesions, as well.  There was  increased activity along the right pleural effusion and adjacent lung suspicious for pleural metastatic disease. The lung nodules and thoracic lymph nodes remained similarly hypermetabolic and similar in size.  He is currently day 11 s/p cycle #1 Taxotere (07/19/2016) with Neulasta support.    He was admitted to [facility] from 06/23/2016 - 06/25/2016 with rectal bleeding. Bleeding was felt hemorrhoidal.  He had shingles on his left mid abdomen area towards his back.  He was discharged on Valtrex.   He was admitted to [facility] from 07/24/2016 - 07/27/2016 with nausea, vomiting, diarrhea and mucosiitis.  He was treated with IVF, anti-emetics, Magic Mouthwash, and Imodium.  He has a macrocytic anemia.  Etiology is likely multi-factorial secondary to chronic disease, rectal bleeding, and renal insufficiency.  Labs on 07/19/2016 revealed a ferritin of 1116, iron saturation of 40% and a TIBC of 265.  He has renal insufficiency of unclear etiology.  Creatinine is 2.16 (CrCl 33 ml/min).  Symptomatically, he feels fatigued.  He is dehydrated.  Exam reveals thick oral secretions.  Ritta Slot has resolved with Magic Mouthwash.  Plan: 1.  Labs today:  CBC with diff, BMP. 2.  No Xgeva today 3.  IVF today 4.  Patient to be seen at [facility]) for upcoming clinical trials 5.  Patient has renal ultrasound today. 6.  Foundation One liquid today (or next visit). 7.  Discuss patient's concerns about side effects associated with Taxotere.  Patient wished to try another chemotherapy.  Discuss consideration of dose reduction with next cycle.  Discuss potential future clinical trial eligibility. 8.  RTC tomorrow for IVF 9.  RTC on 08/02/2016 for MD assessment, labs (BMP, B12, folate, TSH) +/- IVF.   Lequita Asal, MD  07/29/2016, 9:37 AM

## 2016-07-30 ENCOUNTER — Inpatient Hospital Stay: Payer: No Typology Code available for payment source

## 2016-07-30 VITALS — BP 100/64 | HR 97 | Temp 96.6°F | Resp 18

## 2016-07-30 DIAGNOSIS — Z95828 Presence of other vascular implants and grafts: Secondary | ICD-10-CM

## 2016-07-30 DIAGNOSIS — Z5111 Encounter for antineoplastic chemotherapy: Secondary | ICD-10-CM | POA: Diagnosis not present

## 2016-07-30 MED ORDER — SODIUM CHLORIDE 0.9 % IV SOLN
Freq: Once | INTRAVENOUS | Status: AC
  Administered 2016-07-30: 10:00:00 via INTRAVENOUS
  Filled 2016-07-30: qty 1000

## 2016-07-30 MED ORDER — HEPARIN SOD (PORK) LOCK FLUSH 100 UNIT/ML IV SOLN
500.0000 [IU] | Freq: Once | INTRAVENOUS | Status: AC
  Administered 2016-07-30: 500 [IU] via INTRAVENOUS

## 2016-07-30 MED ORDER — HEPARIN SOD (PORK) LOCK FLUSH 100 UNIT/ML IV SOLN
INTRAVENOUS | Status: AC
  Filled 2016-07-30: qty 5

## 2016-08-01 DIAGNOSIS — E860 Dehydration: Secondary | ICD-10-CM | POA: Insufficient documentation

## 2016-08-01 NOTE — Progress Notes (Signed)
[facility] day:  08/02/2016   Chief Complaint: Stuart Spence is a 56 y.o. male with metastatic lung cancer who is seen for assessment on day 15 of cycle #1 Taxotere.  HPI:  The patient was last seen in the medical oncology clinic on 07/29/2016.  At that time, he felt weak and tired.  He was dehydrated.  He had thick oral secretions.  Oral intake had decreased.  He had lost 14 pounds.  He received IVF.  Delton See was held.  He has had renal insufficiency.  Renal ultrasound on 07/29/2016 revealed no acute abnormality.  He returned on 07/30/2016 and received additional IVF.  Symptomatically, he is feeling better. He states he a barbecue Thursday and Friday. He fell Friday as he was lightheaded. He denies any nausea or vomiting. He had diarrhea with blood in his stool on Friday.  Previously, he saw Dr. Vira Agar when he was admitted (06/23/2016 - 06/25/2016) with rectal bleeding.  At that time, his hemoglobin was stable and his rectal bleeding had stopped.  Outpatient colonoscopy was planned.  He has not had follow-up with Dr. Vira Agar.     Past Medical History:  Diagnosis Date  . Cancer ([place])    Lung  . Depression   . ED (erectile dysfunction)   . Gout   . Hypertension   . On home oxygen therapy   . Personal history of chemotherapy    PT TAKING CHEMO EVERY 3 WEEKS    Past Surgical History:  Procedure Laterality Date  . CHEST TUBE INSERTION Left 08/13/2015   Procedure: CHEST TUBE INSERTION;  Surgeon: Nestor Lewandowsky, MD;  Location: [facility] ORS;  Service: General;  Laterality: Left;  . CHEST TUBE INSERTION N/A 09/18/2015   Procedure: PLEURX CATH REMOVAL;  Surgeon: Nestor Lewandowsky, MD;  Location: [facility] ORS;  Service: Thoracic;  Laterality: N/A;  . PORTACATH PLACEMENT Left 08/13/2015   Procedure: INSERTION PORT-A-CATH;  Surgeon: Nestor Lewandowsky, MD;  Location: [facility] ORS;  Service: General;  Laterality: Left;  . PORTACATH PLACEMENT    . TONSILLECTOMY      Family  History  Problem Relation Age of Onset  . Cancer Mother 87    lung  . Heart attack Father 50  . Hypertension Father   . Congestive Heart Failure Father 2    died from  . Diabetes Sister     Social History:  reports that he has never smoked. He has never used smokeless tobacco. He reports that he does not drink alcohol or use drugs.  He has worked 14 years in the [employer].He notes exposure to chemicals.   He recently switched jobs.  He is not working.  He had previously planned on helping to build 2 houses.  Patient's wife name is [name].  He is accompanied by his wife today.  Allergies: No Known Allergies  Current Medications: Current Outpatient Prescriptions  Medication Sig Dispense Refill  . albuterol (PROVENTIL HFA;VENTOLIN HFA) 108 (90 Base) MCG/ACT inhaler Inhale 1-2 puffs into the lungs See admin instructions. Reported on 09/12/2015    . amLODipine (NORVASC) 5 MG tablet Take 1 tablet (5 mg total) by mouth daily. 30 tablet 12  . benazepril (LOTENSIN) 40 MG tablet Take 40 mg by mouth daily.    . Calcium Carbonate (CALCIUM 600 PO) Take 1,200 mg by mouth 2 (two) times daily.    . colchicine 0.6 MG tablet Take 0.6 mg by mouth daily as needed (two pills by mouth at onset, the  one pill one hour later, maximum three pills per gout flare).    Marland Kitchen dexamethasone (DECADRON) 4 MG tablet Take 2 tablets (8 mg total) by mouth 2 (two) times daily. Start the day before Taxotere. Then daily after chemo for 2 days. 30 tablet 1  . Dextromethorphan Polistirex (DELSYM PO) Take by mouth.    Marland Kitchen FLUoxetine (PROZAC) 20 MG capsule Take 2 capsules (40 mg total) by mouth every morning. 60 capsule 12  . folic acid (FOLVITE) 1 MG tablet Take 1 tablet (1 mg total) by mouth daily. 30 tablet 3  . lidocaine (XYLOCAINE) 2 % solution Use as directed 15 mLs in the mouth or throat every 4 (four) hours as needed for mouth pain. 100 mL 0  . lidocaine-prilocaine (EMLA) cream Apply 1 application topically as needed. 30 g  2  . LORazepam (ATIVAN) 0.5 MG tablet Take 1 tablet (0.5 mg total) by mouth every 6 (six) hours as needed. For nausea 20 tablet 0  . nystatin (NYSTATIN) powder Apply topically 3 (three) times daily as needed. 30 g 1  . ondansetron (ZOFRAN) 8 MG tablet Take 1 tablet (8 mg total) by mouth every 8 (eight) hours as needed for nausea or vomiting. 20 tablet 3  . oxyCODONE (ROXICODONE) 5 MG immediate release tablet Take 1 tablet (5 mg total) by mouth every 8 (eight) hours as needed. 20 tablet 0  . OXYGEN Inhale 2 L into the lungs continuous. Reported on 09/12/2015    . pantoprazole (PROTONIX) 40 MG tablet Take 1 tablet (40 mg total) by mouth daily. 30 tablet 1  . sildenafil (VIAGRA) 100 MG tablet Take 1 tablet (100 mg total) by mouth daily as needed for erectile dysfunction. 10 tablet 12   No current facility-administered medications for this visit.     Review of Systems:  GENERAL: Feels better.  No fevers or sweats.  Weight up 1 pound since last visit. PERFORMANCE STATUS (ECOG): 1 HEENT: Mouth improved.  No visual changes or runny nose. Lungs: No shortness of breath or cough. No hemoptysis.   Cardiac: No chest pain, palpitations, orthopnea, or PND. GI:  Diarrhea with blood on 07/30/2016.  No constipation, or melena.No prior colonoscopy. GU: No urgency, frequency, dysuria, or hematuria.  Musculoskeletal: No back pain. No joint pain. No muscle tenderness. Extremities: No pain or swelling. Skin: No rashes or ulcers. Neuro: Lightheaded on 07/30/2016.  No headache, numbness or weakness, balance or coordination issues. Endocrine: No diabetes, thyroid issues, hot flashes or night sweats. Psych: No mood changes, depression or anxiety. Pain: No focal pain. Review of systems: All other systems reviewed and found to be negative.  Physical Exam: Blood pressure 105/73, pulse (!) 116, temperature (!) 96.6 F (35.9 C), temperature source Tympanic, resp. rate 18, height 5' 6"  (1.676 m),  weight 220 lb (99.8 kg), SpO2 95 %. GENERAL: Slightly fatigued appearing gentleman sitting comfortably in the exam room in no acute distress. MENTAL STATUS: Alert and oriented to person, place and time. HEAD: Short dark thin hair. Scruffy gray beard. Normocephalic, atraumatic, face symmetric, no Cushingoid features. EYES: Oval glasses. Pupils equal round and reactive to light and accomodation. No conjunctivitis or scleral icterus. ENT: Oropharynx with no mucositis or thrush. Secretions normal.  Tongue normal. Mucous membranes moist.  RESPIRATORY: Clear to auscultation without rales, wheezes or rhonchi. CARDIOVASCULAR: Regular rate and rhythm without murmur, rub or gallop. ABDOMEN: Soft, non-tender, with active bowel sounds, and no hepatosplenomegaly. No masses. SKIN: No rashes or ulcers. EXTREMITIES: Mild chronic lower extremity  changes.  No skin discoloration or tenderness. No palpable cords. LYMPH NODES: No palpable cervical, supraclavicular, axillary or inguinal adenopathy  NEUROLOGICAL: Unremarkable. PSYCH: Appropriate.  Imaging studies: 08/10/2015:  Chest CT angiogram revealed and ill-defined 4.5 x 4.5 cm area of hypodensity in the right upper lobe with small areas of air bronchogram. This was incompletely characterized but is concerning for a centrally obstructing mass. There was complete occlusion of the right upper, right middle, and right lower lobe bronchi with complete collapse of the right lung. There was a large right pleural effusion. There was a lytic lesion involving the right fourth rib compatible with metastatic disease. There was no CT evidence of pulmonary embolism. 08/20/2015:  PET scan revealed a hypermetabolic 5.2 cm medial right upper lobe lung mass, consistent with primary bronchogenic carcinoma, with a broad attachment to the medial right upper lobe pleura.  There was interlobular septal thickening throughout the right lung suggested a component of  lymphangitic tumor.  There was a malignant small right pleural effusion with hypermetabolism throughout the right pleural space.  There was hypermetabolic ipsilateral hilar, subcarinal and ipsilateral mediastinal nodal metastases.  There was extensive hypermetabolic lytic osseous metastases throughout the axial and proximal appendicular skeleton.  There were numerous hypermetabolic faintly lytic osseous metastases in the left humeral head (SUV 6.8), right acromion, bilateral ribs most prominent in the anterior right fourth rib  (SUV 16.0), thoracolumbar spine (T11 vertebral body with SUV 9.4), bilateral iliac bones (medial right iliac bone with SUV 12.1 and left anterior acetabulum with SUV 10.0), and right proximal femur (SUV 10.0 in the region of the right lesser trochanter).  11/20/2015:  PET scan revealed a marked positive response to therapy.  There was decrease in size and metabolic activity of RIGHT perihilar mass, resolution of mediastinal nodal metabolic activity, and marked decrease in size and metabolic activity of RIGHT anterior chest wall metastasis.  There was decreased in metabolic activity of multiple skeletal metastasis.   01/16/2016:  PET scan revealed a marked interval progression of disease compared with previous exam.  There was progression of multifocal hypermetabolic bone metastases.  There was the interval enlargement and right lung perihilar mass with development of pleural spread of tumor and lymphangitic spread of tumor within the right lung.  There was new hypermetabolic and enlarged sub-carinal lymph node. 05/05/2016:  PET scan revealed a positive response to therapy with some regression of the right upper lobe mass, and decreasing lymphangitic spread of disease in the right lung, decreasing malignant right pleural effusion, and a mixed response of osseous lesions (more regression than progression).  A lesion in the posterior aspect of T11 measured 3.0 x 3.4 cm (previously 2.2 x 2.4  cm), but was no longer hypermetabolic.  The largest persistent lesion hypermetabolic lesions are in the pelvis in the posterior aspect of the left ilium and in the right side of the sacrum, are more lytic and sclerotic in appearance, with the largest of these lesions in the right side of the sacrum measuring up to 2.7 cm (SUV 10.5). 07/08/2016:  PET scan revealed worsening metastatic disease, with a significant increase in osseous metastatic tumor burden and innumerable new bony lesions, as well.  There was increased activity along the right pleural effusion and adjacent lung suspicious for pleural metastatic disease. The lung nodules and thoracic lymph nodes remained similarly hypermetabolic and similar in size.   Appointment on 08/02/2016  Component Date Value Ref Range Status  . Sodium 08/02/2016 140  135 - 145 mmol/L Final  .  Potassium 08/02/2016 3.6  3.5 - 5.1 mmol/L Final  . Chloride 08/02/2016 105  101 - 111 mmol/L Final  . CO2 08/02/2016 27  22 - 32 mmol/L Final  . Glucose, Bld 08/02/2016 162* 65 - 99 mg/dL Final  . BUN 08/02/2016 11  6 - 20 mg/dL Final  . Creatinine, Ser 08/02/2016 1.91* 0.61 - 1.24 mg/dL Final  . Calcium 08/02/2016 7.8* 8.9 - 10.3 mg/dL Final  . GFR calc non Af Amer 08/02/2016 38* >60 mL/min Final  . GFR calc Af Amer 08/02/2016 44* >60 mL/min Final   Comment: (NOTE) The eGFR has been calculated using the CKD EPI equation. This calculation has not been validated in all clinical situations. eGFR's persistently <60 mL/min signify possible Chronic Kidney Disease.   . 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 male with stage IV adenocarcinoma of the right lung.  He has no smoking history.  He presented with with a large right sided pleural effusion and an ill-defined 4.5 x 4.5 cm mass in the right upper lobe. Thoracentesis x 2 of approximately 4.7 liters of blood fluid revealed adenocarcinoma. TTF-1 and Napsin A were immunoreactive  and consistent with a lung primary. Pleur-X catheter was placed on 08/13/2015 (removed 09/18/2015).  [facility] testing on 08/18/2015 was negative for:  EGFR, ALK, ROS1 and RET.  Foundation One testing revealed ERBB2 amplification equivocal, RICTOR amplification, APC [id number], CDKN2A/B loss, microsatellite stable, tumor mutation burden intermediate (6 Muts/Mb).  EGFR, K-RAS, ALK, BRAF, MET and RET were negative.  Plain films on 08/21/2015 revealed a left humerus lytic lesion with some thinning and some absence of the cortical margin.  He received 800 cGy to the left humerus on 09/17/2015.  He received 4 cycles of Keytruda, carboplatin and Alimta (08/22/2015 - 10/31/2015).   He received 3 cycles of single agent Keytruda (11/21/2015 - 01/02/2016).  Pleur-X catheter was removed on 09/18/2015.    He received 6 additional cycles of carboplatin and Alimta (01/23/2016 -  05/07/2016).  He has received 4 cycles of Avastin (03/05/2016 - 05/07/2016).    He received 2 cycles of maintenance Alimta and Avastin (05/28/2016 - 06/18/2016).  He received B12 every 9 weeks (last 06/18/2016).  He receives monthly Xgeva (began 08/22/2015; last 05/28/2016).   PET scan on 07/08/2016 revealed worsening metastatic disease, with a significant increase in osseous metastatic tumor burden and innumerable new bony lesions, as well.  There was increased activity along the right pleural effusion and adjacent lung suspicious for pleural metastatic disease. The lung nodules and thoracic lymph nodes remained similarly hypermetabolic and similar in size.  CEA was 837.1 on 09/12/2015, 448 on 10/10/2015, 346 on 10/31/2015, 261.3 on 11/21/2015, 726.2 on 01/02/2016, 857.2 on 01/09/2016, 1458.0 on 01/23/2016, 1350.0 on 01/30/2016, 999.6 on 02/13/2016, 870.8 on 03/04/2016, 671.9 on 03/25/2016, 587.6 on 04/16/2016, 510.6 on 05/07/2016, 512.5 on 05/27/2016, 504.1 on 05/28/2016, 596.3 on 06/17/2016, 853.1 on 07/08/2016, and 932.1 on  07/19/2016.Marland Kitchen  He is currently day 15 s/p cycle #1 Taxotere (07/19/2016) with Neulasta support.  Cycle #1 was complicated by nausea, vomiting, dehydration, mucositis, and thrush.  He was admitted to [facility] from 06/23/2016 - 06/25/2016 with rectal bleeding.  Bleeding was felt possibly hemorrhoidal.  He had shingles on his left mid abdomen.  He was discharged on Valtrex.  He was to schedule an outpatient colonoscopy.  He was admitted to [facility] from 07/24/2016 - 07/27/2016 with nausea, vomiting, diarrhea and mucosiitis.  He was  treated with IVF, anti-emetics, Magic Mouthwash, and Imodium.  He has a macrocytic anemia.  Etiology is likely multi-factorial secondary to chronic disease, rectal bleeding, and renal insufficiency.  Labs on 07/19/2016 revealed a ferritin of 1116, iron saturation of 40% and a TIBC of 265.  He has renal insufficiency of unclear etiology.  Renal ultrasound on 07/29/2016 revealed no acute abnormality.  Creatinine is 2.16 (CrCl 33 ml/min).  Symptomatically, he is feeling better.  Mucositis and thrush has resolved.  He has had some recurrent rectal bleeding. Creatinine is 1.91.  Plan: 1.  Labs today:  BMP. 2.  Review renal ultrasound.  No evidence of hydronephrosis.  Follow-up with nephrology on 08/07/2016. 3.  Discuss need to follow-up with Dr. Vira Agar to assess rectal bleeding.  Would avoid Cyramza (ramucirumab) secondary to increased rate of gastrointestinal hemorrhage. 4.  Discuss plan to continue Taxotere every 3 weeks with Neulasta support.  Patient wishes to try something else given the toxicities associated with cycle #1.  No other good option outside of clinical trial.  Patient to see Dr Aniceto Boss at [facility].  Discuss possible Taxotere with dose reduction (60 mg/m2). 5.  Await results of Foundation One liquid testing. 6.  Please schedule second opinion with Dr. Aniceto Boss this week if possible for clinical trial eligibility. 7.  Please reschedule appt with [place] this week-  ongoing GI bleeding (ASAP) 8.  RTC in 1 week for MD assessment, labs (CBC with diff, CMP, Mg, CEA, B12, folate), and Taxotere.   Lequita Asal, MD  08/02/2016, 10:32 AM

## 2016-08-02 ENCOUNTER — Inpatient Hospital Stay: Payer: No Typology Code available for payment source

## 2016-08-02 ENCOUNTER — Encounter: Payer: Self-pay | Admitting: Hematology and Oncology

## 2016-08-02 ENCOUNTER — Inpatient Hospital Stay (HOSPITAL_BASED_OUTPATIENT_CLINIC_OR_DEPARTMENT_OTHER): Payer: No Typology Code available for payment source | Admitting: Hematology and Oncology

## 2016-08-02 VITALS — BP 105/73 | HR 116 | Temp 96.6°F | Resp 18 | Ht 66.0 in | Wt 220.0 lb

## 2016-08-02 DIAGNOSIS — Z801 Family history of malignant neoplasm of trachea, bronchus and lung: Secondary | ICD-10-CM

## 2016-08-02 DIAGNOSIS — N289 Disorder of kidney and ureter, unspecified: Secondary | ICD-10-CM

## 2016-08-02 DIAGNOSIS — K921 Melena: Secondary | ICD-10-CM

## 2016-08-02 DIAGNOSIS — R42 Dizziness and giddiness: Secondary | ICD-10-CM

## 2016-08-02 DIAGNOSIS — M109 Gout, unspecified: Secondary | ICD-10-CM

## 2016-08-02 DIAGNOSIS — I10 Essential (primary) hypertension: Secondary | ICD-10-CM

## 2016-08-02 DIAGNOSIS — C3491 Malignant neoplasm of unspecified part of right bronchus or lung: Secondary | ICD-10-CM

## 2016-08-02 DIAGNOSIS — C771 Secondary and unspecified malignant neoplasm of intrathoracic lymph nodes: Secondary | ICD-10-CM

## 2016-08-02 DIAGNOSIS — Z79899 Other long term (current) drug therapy: Secondary | ICD-10-CM

## 2016-08-02 DIAGNOSIS — M545 Low back pain: Secondary | ICD-10-CM

## 2016-08-02 DIAGNOSIS — D649 Anemia, unspecified: Secondary | ICD-10-CM

## 2016-08-02 DIAGNOSIS — Z7189 Other specified counseling: Secondary | ICD-10-CM

## 2016-08-02 DIAGNOSIS — K625 Hemorrhage of anus and rectum: Secondary | ICD-10-CM

## 2016-08-02 DIAGNOSIS — R197 Diarrhea, unspecified: Secondary | ICD-10-CM

## 2016-08-02 DIAGNOSIS — C7951 Secondary malignant neoplasm of bone: Secondary | ICD-10-CM

## 2016-08-02 DIAGNOSIS — R630 Anorexia: Secondary | ICD-10-CM

## 2016-08-02 DIAGNOSIS — Z9181 History of falling: Secondary | ICD-10-CM

## 2016-08-02 DIAGNOSIS — Z9981 Dependence on supplemental oxygen: Secondary | ICD-10-CM

## 2016-08-02 DIAGNOSIS — Z77098 Contact with and (suspected) exposure to other hazardous, chiefly nonmedicinal, chemicals: Secondary | ICD-10-CM

## 2016-08-02 DIAGNOSIS — R531 Weakness: Secondary | ICD-10-CM | POA: Diagnosis not present

## 2016-08-02 DIAGNOSIS — R5383 Other fatigue: Secondary | ICD-10-CM

## 2016-08-02 DIAGNOSIS — Z5111 Encounter for antineoplastic chemotherapy: Secondary | ICD-10-CM | POA: Diagnosis not present

## 2016-08-02 DIAGNOSIS — J320 Chronic maxillary sinusitis: Secondary | ICD-10-CM

## 2016-08-02 DIAGNOSIS — C3411 Malignant neoplasm of upper lobe, right bronchus or lung: Secondary | ICD-10-CM | POA: Diagnosis not present

## 2016-08-02 DIAGNOSIS — E669 Obesity, unspecified: Secondary | ICD-10-CM

## 2016-08-02 DIAGNOSIS — J90 Pleural effusion, not elsewhere classified: Secondary | ICD-10-CM | POA: Diagnosis not present

## 2016-08-02 DIAGNOSIS — R634 Abnormal weight loss: Secondary | ICD-10-CM

## 2016-08-02 DIAGNOSIS — Z7952 Long term (current) use of systemic steroids: Secondary | ICD-10-CM

## 2016-08-02 LAB — BASIC METABOLIC PANEL
Anion gap: 8 (ref 5–15)
BUN: 11 mg/dL (ref 6–20)
CO2: 27 mmol/L (ref 22–32)
Calcium: 7.8 mg/dL — ABNORMAL LOW (ref 8.9–10.3)
Chloride: 105 mmol/L (ref 101–111)
Creatinine, Ser: 1.91 mg/dL — ABNORMAL HIGH (ref 0.61–1.24)
GFR calc Af Amer: 44 mL/min — ABNORMAL LOW (ref >60)
GFR calc non Af Amer: 38 mL/min — ABNORMAL LOW (ref >60)
Glucose, Bld: 162 mg/dL — ABNORMAL HIGH (ref 65–99)
Potassium: 3.6 mmol/L (ref 3.5–5.1)
Sodium: 140 mmol/L (ref 135–145)

## 2016-08-02 MED ORDER — ONDANSETRON HCL 8 MG PO TABS: 8.0000 mg | ORAL_TABLET | Freq: Three times a day (TID) | ORAL | 3 refills | Status: DC | PRN

## 2016-08-04 DIAGNOSIS — C3490 Malignant neoplasm of unspecified part of unspecified bronchus or lung: Secondary | ICD-10-CM | POA: Insufficient documentation

## 2016-08-04 DIAGNOSIS — C7931 Secondary malignant neoplasm of brain: Secondary | ICD-10-CM

## 2016-08-06 ENCOUNTER — Other Ambulatory Visit: Payer: Self-pay | Admitting: Hematology and Oncology

## 2016-08-06 DIAGNOSIS — R918 Other nonspecific abnormal finding of lung field: Secondary | ICD-10-CM

## 2016-08-08 NOTE — Progress Notes (Signed)
[place]  [phone] [phone]  ID: Stuart Spence OB: December 02, 1960  MR#: [id number]  TLX#:[id number]  Patient Care Team: Guadalupe Maple, MD as PCP - General (Family Medicine) Nestor Lewandowsky, MD as Referring Physician (Cardiothoracic Surgery) Laverle Hobby, MD as Consulting Physician (Pulmonary Disease)   Chief Complaint: Stuart Spence is a 56 y.o. male with metastatic lung cancer who is seen for assessment for his next infusion of Taxotere.  HPI:  Patient returns to clinic today for further evaluation. He continues to have significant weakness and fatigue and declining performance status. He states he is not fully recovered from his last treatment with Taxotere 3 weeks ago. In the interim, he was evaluated at [facility] for clinical trial, but ultimately did not qualify. He has no neurologic complaints. He denies any fevers. He has a poor appetite and continues to lose weight. He denies any chest pain, but has chronic shortness of breath. He denies any nausea, vomiting, constipation, or diarrhea. Patient offers no further specific complaints.  REVIEW OF SYSTEMS:   Review of Systems  Constitutional: Positive for malaise/fatigue and weight loss. Negative for fever.  Respiratory: Positive for shortness of breath. Negative for cough and hemoptysis.   Cardiovascular: Negative.  Negative for chest pain and leg swelling.  Gastrointestinal: Negative.  Negative for abdominal pain.  Genitourinary: Negative.   Musculoskeletal: Negative.   Skin: Negative.  Negative for rash.  Neurological: Positive for weakness.  Psychiatric/Behavioral: Positive for depression. The patient is nervous/anxious.     As per HPI. Otherwise, a complete review of systems is negative.  PAST MEDICAL HISTORY: Past Medical History:  Diagnosis Date  . Cancer ([place])    Lung  . Depression   . ED (erectile dysfunction)   . Gout   . Hypertension   . On home oxygen  therapy   . Personal history of chemotherapy    PT TAKING CHEMO EVERY 3 WEEKS    PAST SURGICAL HISTORY: Past Surgical History:  Procedure Laterality Date  . CHEST TUBE INSERTION Left 08/13/2015   Procedure: CHEST TUBE INSERTION;  Surgeon: Nestor Lewandowsky, MD;  Location: [facility] ORS;  Service: General;  Laterality: Left;  . CHEST TUBE INSERTION N/A 09/18/2015   Procedure: PLEURX CATH REMOVAL;  Surgeon: Nestor Lewandowsky, MD;  Location: [facility] ORS;  Service: Thoracic;  Laterality: N/A;  . PORTACATH PLACEMENT Left 08/13/2015   Procedure: INSERTION PORT-A-CATH;  Surgeon: Nestor Lewandowsky, MD;  Location: [facility] ORS;  Service: General;  Laterality: Left;  . PORTACATH PLACEMENT    . TONSILLECTOMY      FAMILY HISTORY: Family History  Problem Relation Age of Onset  . Cancer Mother 8    lung  . Heart attack Father 44  . Hypertension Father   . Congestive Heart Failure Father 58    died from  . Diabetes Sister     ADVANCED DIRECTIVES (Y/N):  N  HEALTH MAINTENANCE: Social History  Substance Use Topics  . Smoking status: Never Smoker  . Smokeless tobacco: Never Used  . Alcohol use No     Comment: gave it up in August 2016     Colonoscopy:  PAP:  Bone density:  Lipid panel:  No Known Allergies  Current Outpatient Prescriptions  Medication Sig Dispense Refill  . albuterol (PROVENTIL HFA;VENTOLIN HFA) 108 (90 Base) MCG/ACT inhaler Inhale 1-2 puffs into the lungs See admin instructions. Reported on 09/12/2015    . amLODipine (NORVASC) 5 MG tablet Take 1 tablet (5 mg total) by mouth daily. [place]  tablet 12  . benazepril (LOTENSIN) 40 MG tablet Take 40 mg by mouth daily.    . Calcium Carbonate (CALCIUM 600 PO) Take 1,200 mg by mouth 2 (two) times daily.    . colchicine 0.6 MG tablet Take 0.6 mg by mouth daily as needed (two pills by mouth at onset, the one pill one hour later, maximum three pills per gout flare).    Marland Kitchen dexamethasone (DECADRON) 4 MG tablet Take 2 tablets (8 mg total) by mouth 2 (two) times  daily. Start the day before Taxotere. Then daily after chemo for 2 days. 30 tablet 1  . Dextromethorphan Polistirex (DELSYM PO) Take by mouth.    Marland Kitchen FLUoxetine (PROZAC) 20 MG capsule Take 2 capsules (40 mg total) by mouth every morning. 60 capsule 12  . folic acid (FOLVITE) 1 MG tablet Take 1 tablet (1 mg total) by mouth daily. 30 tablet 3  . lidocaine-prilocaine (EMLA) cream Apply 1 application topically as needed. 30 g 2  . LORazepam (ATIVAN) 0.5 MG tablet Take 1 tablet (0.5 mg total) by mouth every 6 (six) hours as needed. For nausea 20 tablet 0  . nystatin (NYSTATIN) powder Apply topically 3 (three) times daily as needed. 30 g 1  . ondansetron (ZOFRAN) 8 MG tablet Take 1 tablet (8 mg total) by mouth every 8 (eight) hours as needed for nausea or vomiting. 20 tablet 3  . oxyCODONE (ROXICODONE) 5 MG immediate release tablet Take 1 tablet (5 mg total) by mouth every 8 (eight) hours as needed. 20 tablet 0  . OXYGEN Inhale 2 L into the lungs continuous. Reported on 09/12/2015    . pantoprazole (PROTONIX) 40 MG tablet Take 1 tablet (40 mg total) by mouth daily. 30 tablet 1  . sildenafil (VIAGRA) 100 MG tablet Take 1 tablet (100 mg total) by mouth daily as needed for erectile dysfunction. 10 tablet 12  . lidocaine (XYLOCAINE) 2 % solution Use as directed 15 mLs in the mouth or throat every 4 (four) hours as needed for mouth pain. (Patient not taking: Reported on 08/09/2016) 100 mL 0   Current Facility-Administered Medications  Medication Dose Route Frequency Provider Last Rate Last Dose  . dexamethasone (DECADRON) injection 10 mg  10 mg Intravenous Once Nolon Stalls C, MD        OBJECTIVE: Vitals:   08/09/16 1126  BP: 117/83  Pulse: (!) 111  Temp: 97 F (36.1 C)     Body mass index is 35.22 kg/m.    ECOG FS:2 - Symptomatic, <50% confined to bed  General: Well-developed, well-nourished, no acute distress. Eyes: Pink conjunctiva, anicteric sclera. HEENT: Normocephalic, moist mucous  membranes, clear oropharnyx. Lungs: Clear to auscultation bilaterally. Heart: Regular rate and rhythm. No rubs, murmurs, or gallops. Abdomen: Soft, nontender, nondistended. No organomegaly noted, normoactive bowel sounds. Musculoskeletal: No edema, cyanosis, or clubbing. Neuro: Alert, answering all questions appropriately. Cranial nerves grossly intact. Skin: No rashes or petechiae noted. Psych: Normal affect. Lymphatics: No cervical, calvicular, axillary or inguinal LAD.   LAB RESULTS:  Lab Results  Component Value Date   NA 139 08/09/2016   K 4.0 08/09/2016   CL 100 (L) 08/09/2016   CO2 30 08/09/2016   GLUCOSE 128 (H) 08/09/2016   BUN 12 08/09/2016   CREATININE 2.21 (H) 08/09/2016   CALCIUM 8.2 (L) 08/09/2016   PROT 7.8 08/09/2016   ALBUMIN 3.1 (L) 08/09/2016   AST 25 08/09/2016   ALT 13 (L) 08/09/2016   ALKPHOS 138 (H) 08/09/2016   BILITOT  0.6 08/09/2016   GFRNONAA 32 (L) 08/09/2016   GFRAA 37 (L) 08/09/2016    Lab Results  Component Value Date   WBC 10.6 08/09/2016   NEUTROABS 8.6 (H) 08/09/2016   HGB 10.0 (L) 08/09/2016   HCT 30.1 (L) 08/09/2016   MCV 104.6 (H) 08/09/2016   PLT 189 08/09/2016     STUDIES: US Renal  Result Date: 07/29/2016 CLINICAL DATA:  Renal insufficiency. EXAM: RENAL / URINARY TRACT ULTRASOUND COMPLETE COMPARISON:  CT 07/08/2016 FINDINGS: Right Kidney: Length: 10.3 cm. Echogenicity within normal limits. No mass or hydronephrosis visualized. Left Kidney: Length: 11.9 cm. Echogenicity within normal limits. No mass or hydronephrosis visualized. Bladder: Appears normal for degree of bladder distention. IMPRESSION: No acute abnormality. No evidence of hydronephrosis or bladder distention. Electronically Signed   By: Marcello Moores  Register   On: 07/29/2016 14:30    Assessment:  Stuart Spence is a 56 y.o. male with stage IV adenocarcinoma of the right lung.  He has no smoking history.  He presented with with a large right sided pleural effusion and an  ill-defined 4.5 x 4.5 cm mass in the right upper lobe. Thoracentesis x 2 of approximately 4.7 liters of blood fluid revealed adenocarcinoma. TTF-1 and Napsin A were immunoreactive and consistent with a lung primary. Pleur-X catheter was placed on 08/13/2015 (removed 09/18/2015).  [facility] testing on 08/18/2015 was negative for:  EGFR, ALK, ROS1 and RET.  Foundation One testing revealed ERBB2 amplification equivocal, RICTOR amplification, APC [id number], CDKN2A/B loss, microsatellite stable, tumor mutation burden intermediate (6 Muts/Mb).  EGFR, K-RAS, ALK, BRAF, MET and RET were negative.  Plain films on 08/21/2015 revealed a left humerus lytic lesion with some thinning and some absence of the cortical margin.  He received 800 cGy to the left humerus on 09/17/2015.  He received 4 cycles of Keytruda, carboplatin and Alimta (08/22/2015 - 10/31/2015).   He received 3 cycles of single agent Keytruda (11/21/2015 - 01/02/2016).  Pleur-X catheter was removed on 09/18/2015.    He received 6 additional cycles of carboplatin and Alimta (01/23/2016 -  05/07/2016).  He has received 4 cycles of Avastin (03/05/2016 - 05/07/2016).    He received 2 cycles of maintenance Alimta and Avastin (05/28/2016 - 06/18/2016).  He received B12 every 9 weeks (last 06/18/2016).  He receives monthly Xgeva (began 08/22/2015; last 05/28/2016).   PET scan on 07/08/2016 revealed worsening metastatic disease, with a significant increase in osseous metastatic tumor burden and innumerable new bony lesions, as well.  There was increased activity along the right pleural effusion and adjacent lung suspicious for pleural metastatic disease. The lung nodules and thoracic lymph nodes remained similarly hypermetabolic and similar in size.  CEA was 837.1 on 09/12/2015, 448 on 10/10/2015, 346 on 10/31/2015, 261.3 on 11/21/2015, 726.2 on 01/02/2016, 857.2 on 01/09/2016, 1458.0 on 01/23/2016, 1350.0 on 01/30/2016, 999.6 on 02/13/2016,  870.8 on 03/04/2016, 671.9 on 03/25/2016, 587.6 on 04/16/2016, 510.6 on 05/07/2016, 512.5 on 05/27/2016, 504.1 on 05/28/2016, 596.3 on 06/17/2016, 853.1 on 07/08/2016, and 932.1 on 07/19/2016.Marland Kitchen  He is currently day 15 s/p cycle #1 Taxotere (07/19/2016) with Neulasta support.  Cycle #1 was complicated by nausea, vomiting, dehydration, mucositis, and thrush.  He was admitted to [facility] from 06/23/2016 - 06/25/2016 with rectal bleeding.  Bleeding was felt possibly hemorrhoidal.  He had shingles on his left mid abdomen. He was discharged on Valtrex.  He was to schedule an outpatient colonoscopy.  He was admitted to [facility] from 07/24/2016 - 07/27/2016 with nausea, vomiting, diarrhea and mucosiitis.  He was treated with IVF, anti-emetics, Magic Mouthwash, and Imodium.  He has a macrocytic anemia. Etiology is likely multi-factorial secondary to chronic disease, rectal bleeding, and renal insufficiency. Labs on 07/19/2016 revealed a ferritin of 1116, iron saturation of 40% and a TIBC of 265.  He has renal insufficiencyof unclear etiology. Renal ultrasound on 07/29/2016 revealed no acute abnormality.  Creatinine is 2.16 (CrCl 33 ml/min).   Plan:  1.  Labs today. 2. Weakness and fatigue: Patient has declined treatment at this time. Instead he will receive 1 L IV fluids along with 10 mg IV Decadron. 3. Question need to follow-up with Dr. Vira Agar to assess rectal bleeding.  Continue to hold Cyramza (ramucirumab) secondary to increased rate of gastrointestinal hemorrhage. 4.   Lung cancer: Patient hesitant to continue Taxotere even at a dose reduction. He expressed understanding that his options are limited at this time. Hospice as well as home palliative care were discussed with the patient at length, but he is not ready to make a decision at this point. Patient was evaluated at [facility], but does not qualify for clinical trial. Proceed with IV fluids as above. Return to clinic in 1 week to discuss  reinitiate treatment or possibly enrolling in hospice.  5.  Foundation One as above. 6. Renal insufficiency: Patient's creatinine slightly above his baseline. IV fluids as above.  Approximately 30 minutes was spent in discussion of which greater than 50% was consultation.   Patient expressed understanding and was in agreement with this plan. He also understands that He can call clinic at any time with any questions, concerns, or complaints.   Cancer Staging Adenocarcinoma, lung ([place]) Staging form: Lung, AJCC 7th Edition - Clinical stage from 08/20/2015: Stage IV (T3, N2, M1b) - Signed by Lequita Asal, MD on 08/21/2015 - Pathologic: No stage assigned - Unsigned   Lloyd Huger, MD   08/09/2016 2:19 PM

## 2016-08-09 ENCOUNTER — Inpatient Hospital Stay: Payer: No Typology Code available for payment source

## 2016-08-09 ENCOUNTER — Inpatient Hospital Stay: Payer: No Typology Code available for payment source | Attending: Hematology and Oncology

## 2016-08-09 ENCOUNTER — Inpatient Hospital Stay (HOSPITAL_BASED_OUTPATIENT_CLINIC_OR_DEPARTMENT_OTHER): Payer: No Typology Code available for payment source | Admitting: Oncology

## 2016-08-09 ENCOUNTER — Encounter: Payer: Self-pay | Admitting: Oncology

## 2016-08-09 VITALS — BP 117/83 | HR 111 | Temp 97.0°F | Wt 218.2 lb

## 2016-08-09 DIAGNOSIS — C3411 Malignant neoplasm of upper lobe, right bronchus or lung: Secondary | ICD-10-CM | POA: Diagnosis not present

## 2016-08-09 DIAGNOSIS — D649 Anemia, unspecified: Secondary | ICD-10-CM

## 2016-08-09 DIAGNOSIS — J90 Pleural effusion, not elsewhere classified: Secondary | ICD-10-CM | POA: Insufficient documentation

## 2016-08-09 DIAGNOSIS — C7951 Secondary malignant neoplasm of bone: Secondary | ICD-10-CM

## 2016-08-09 DIAGNOSIS — K922 Gastrointestinal hemorrhage, unspecified: Secondary | ICD-10-CM | POA: Diagnosis not present

## 2016-08-09 DIAGNOSIS — C3491 Malignant neoplasm of unspecified part of right bronchus or lung: Secondary | ICD-10-CM

## 2016-08-09 DIAGNOSIS — R5381 Other malaise: Secondary | ICD-10-CM | POA: Diagnosis not present

## 2016-08-09 DIAGNOSIS — R531 Weakness: Secondary | ICD-10-CM | POA: Diagnosis not present

## 2016-08-09 DIAGNOSIS — N289 Disorder of kidney and ureter, unspecified: Secondary | ICD-10-CM | POA: Diagnosis not present

## 2016-08-09 DIAGNOSIS — R5383 Other fatigue: Secondary | ICD-10-CM | POA: Diagnosis not present

## 2016-08-09 DIAGNOSIS — Z79899 Other long term (current) drug therapy: Secondary | ICD-10-CM | POA: Diagnosis not present

## 2016-08-09 DIAGNOSIS — Z9221 Personal history of antineoplastic chemotherapy: Secondary | ICD-10-CM | POA: Insufficient documentation

## 2016-08-09 DIAGNOSIS — R112 Nausea with vomiting, unspecified: Secondary | ICD-10-CM | POA: Insufficient documentation

## 2016-08-09 DIAGNOSIS — R634 Abnormal weight loss: Secondary | ICD-10-CM

## 2016-08-09 DIAGNOSIS — C771 Secondary and unspecified malignant neoplasm of intrathoracic lymph nodes: Secondary | ICD-10-CM

## 2016-08-09 DIAGNOSIS — F418 Other specified anxiety disorders: Secondary | ICD-10-CM | POA: Diagnosis not present

## 2016-08-09 DIAGNOSIS — Z9981 Dependence on supplemental oxygen: Secondary | ICD-10-CM

## 2016-08-09 DIAGNOSIS — I10 Essential (primary) hypertension: Secondary | ICD-10-CM | POA: Insufficient documentation

## 2016-08-09 DIAGNOSIS — Z7189 Other specified counseling: Secondary | ICD-10-CM

## 2016-08-09 DIAGNOSIS — Z77098 Contact with and (suspected) exposure to other hazardous, chiefly nonmedicinal, chemicals: Secondary | ICD-10-CM | POA: Insufficient documentation

## 2016-08-09 DIAGNOSIS — K625 Hemorrhage of anus and rectum: Secondary | ICD-10-CM | POA: Insufficient documentation

## 2016-08-09 DIAGNOSIS — M549 Dorsalgia, unspecified: Secondary | ICD-10-CM | POA: Insufficient documentation

## 2016-08-09 DIAGNOSIS — R51 Headache: Secondary | ICD-10-CM | POA: Insufficient documentation

## 2016-08-09 DIAGNOSIS — M109 Gout, unspecified: Secondary | ICD-10-CM | POA: Insufficient documentation

## 2016-08-09 DIAGNOSIS — Z5111 Encounter for antineoplastic chemotherapy: Secondary | ICD-10-CM

## 2016-08-09 LAB — VITAMIN B12: Vitamin B-12: 5668 pg/mL — ABNORMAL HIGH (ref 180–914)

## 2016-08-09 LAB — COMPREHENSIVE METABOLIC PANEL
ALT: 13 U/L — ABNORMAL LOW (ref 17–63)
AST: 25 U/L (ref 15–41)
Albumin: 3.1 g/dL — ABNORMAL LOW (ref 3.5–5.0)
Alkaline Phosphatase: 138 U/L — ABNORMAL HIGH (ref 38–126)
Anion gap: 9 (ref 5–15)
BUN: 12 mg/dL (ref 6–20)
CO2: 30 mmol/L (ref 22–32)
Calcium: 8.2 mg/dL — ABNORMAL LOW (ref 8.9–10.3)
Chloride: 100 mmol/L — ABNORMAL LOW (ref 101–111)
Creatinine, Ser: 2.21 mg/dL — ABNORMAL HIGH (ref 0.61–1.24)
GFR calc Af Amer: 37 mL/min — ABNORMAL LOW (ref >60)
GFR calc non Af Amer: 32 mL/min — ABNORMAL LOW (ref >60)
Glucose, Bld: 128 mg/dL — ABNORMAL HIGH (ref 65–99)
Potassium: 4 mmol/L (ref 3.5–5.1)
Sodium: 139 mmol/L (ref 135–145)
Total Bilirubin: 0.6 mg/dL (ref 0.3–1.2)
Total Protein: 7.8 g/dL (ref 6.5–8.1)

## 2016-08-09 LAB — CBC WITH DIFFERENTIAL/PLATELET
Basophils Absolute: 0.1 10*3/uL (ref 0–0.1)
Basophils Relative: 1 %
Eosinophils Absolute: 0 10*3/uL (ref 0–0.7)
Eosinophils Relative: 0 %
HCT: 30.1 % — ABNORMAL LOW (ref 40.0–52.0)
Hemoglobin: 10 g/dL — ABNORMAL LOW (ref 13.0–18.0)
Lymphocytes Relative: 12 %
Lymphs Abs: 1.2 10*3/uL (ref 1.0–3.6)
MCH: 34.7 pg — ABNORMAL HIGH (ref 26.0–34.0)
MCHC: 33.2 g/dL (ref 32.0–36.0)
MCV: 104.6 fL — ABNORMAL HIGH (ref 80.0–100.0)
Monocytes Absolute: 0.7 10*3/uL (ref 0.2–1.0)
Monocytes Relative: 7 %
Neutro Abs: 8.6 10*3/uL — ABNORMAL HIGH (ref 1.4–6.5)
Neutrophils Relative %: 80 %
Platelets: 189 10*3/uL (ref 150–440)
RBC: 2.88 MIL/uL — ABNORMAL LOW (ref 4.40–5.90)
RDW: 18.2 % — ABNORMAL HIGH (ref 11.5–14.5)
WBC: 10.6 10*3/uL (ref 3.8–10.6)

## 2016-08-09 LAB — MAGNESIUM: Magnesium: 2.3 mg/dL (ref 1.7–2.4)

## 2016-08-09 MED ORDER — SODIUM CHLORIDE 0.9 % IV SOLN: 10.0000 mg | Freq: Once | INTRAVENOUS | Status: DC

## 2016-08-09 MED ORDER — DEXAMETHASONE SODIUM PHOSPHATE 10 MG/ML IJ SOLN: 10.0000 mg | Freq: Once | INTRAMUSCULAR | Status: DC

## 2016-08-09 MED ORDER — SODIUM CHLORIDE 0.9% FLUSH
10.0000 mL | Freq: Once | INTRAVENOUS | Status: AC
  Administered 2016-08-09: 10 mL via INTRAVENOUS
  Filled 2016-08-09: qty 10

## 2016-08-09 MED ORDER — SODIUM CHLORIDE 0.9 % IV SOLN
10.0000 mg | Freq: Once | INTRAVENOUS | Status: AC
  Administered 2016-08-09: 10 mg via INTRAVENOUS
  Filled 2016-08-09: qty 1

## 2016-08-09 MED ORDER — SODIUM CHLORIDE 0.9 % IV SOLN
Freq: Once | INTRAVENOUS | Status: AC
  Administered 2016-08-09: 11:00:00 via INTRAVENOUS
  Filled 2016-08-09: qty 1000

## 2016-08-09 MED ORDER — HEPARIN SOD (PORK) LOCK FLUSH 100 UNIT/ML IV SOLN
500.0000 [IU] | Freq: Once | INTRAVENOUS | Status: AC
  Administered 2016-08-09: 500 [IU] via INTRAVENOUS
  Filled 2016-08-09: qty 5

## 2016-08-09 NOTE — Progress Notes (Signed)
Pt noted to have oxygen satuations of 90-91% on room air at rest with a heart rate of 98.  Upon exertion, O2 sats dropped to 89% on room air and heart rate increased to 128.  Pt was noticeably short of breath with exertion.

## 2016-08-10 LAB — CEA: CEA: 1233 ng/mL — ABNORMAL HIGH (ref 0.0–4.7)

## 2016-08-13 ENCOUNTER — Encounter: Payer: Self-pay | Admitting: Hematology and Oncology

## 2016-08-16 ENCOUNTER — Other Ambulatory Visit: Payer: Self-pay | Admitting: Hematology and Oncology

## 2016-08-16 ENCOUNTER — Inpatient Hospital Stay: Payer: No Typology Code available for payment source

## 2016-08-16 ENCOUNTER — Other Ambulatory Visit: Payer: Self-pay | Admitting: *Deleted

## 2016-08-16 ENCOUNTER — Inpatient Hospital Stay (HOSPITAL_BASED_OUTPATIENT_CLINIC_OR_DEPARTMENT_OTHER): Payer: No Typology Code available for payment source | Admitting: Hematology and Oncology

## 2016-08-16 ENCOUNTER — Encounter: Payer: Self-pay | Admitting: Hematology and Oncology

## 2016-08-16 VITALS — BP 114/70 | HR 101 | Temp 97.0°F | Resp 20 | Ht 66.0 in | Wt 218.4 lb

## 2016-08-16 DIAGNOSIS — Z77098 Contact with and (suspected) exposure to other hazardous, chiefly nonmedicinal, chemicals: Secondary | ICD-10-CM

## 2016-08-16 DIAGNOSIS — J90 Pleural effusion, not elsewhere classified: Secondary | ICD-10-CM | POA: Diagnosis not present

## 2016-08-16 DIAGNOSIS — F419 Anxiety disorder, unspecified: Secondary | ICD-10-CM

## 2016-08-16 DIAGNOSIS — C7951 Secondary malignant neoplasm of bone: Secondary | ICD-10-CM

## 2016-08-16 DIAGNOSIS — R634 Abnormal weight loss: Secondary | ICD-10-CM | POA: Diagnosis not present

## 2016-08-16 DIAGNOSIS — M109 Gout, unspecified: Secondary | ICD-10-CM

## 2016-08-16 DIAGNOSIS — D649 Anemia, unspecified: Secondary | ICD-10-CM

## 2016-08-16 DIAGNOSIS — R5381 Other malaise: Secondary | ICD-10-CM | POA: Diagnosis not present

## 2016-08-16 DIAGNOSIS — Z85118 Personal history of other malignant neoplasm of bronchus and lung: Secondary | ICD-10-CM

## 2016-08-16 DIAGNOSIS — K922 Gastrointestinal hemorrhage, unspecified: Secondary | ICD-10-CM | POA: Diagnosis not present

## 2016-08-16 DIAGNOSIS — Z9221 Personal history of antineoplastic chemotherapy: Secondary | ICD-10-CM

## 2016-08-16 DIAGNOSIS — R5383 Other fatigue: Secondary | ICD-10-CM

## 2016-08-16 DIAGNOSIS — C3491 Malignant neoplasm of unspecified part of right bronchus or lung: Secondary | ICD-10-CM

## 2016-08-16 DIAGNOSIS — C771 Secondary and unspecified malignant neoplasm of intrathoracic lymph nodes: Secondary | ICD-10-CM | POA: Diagnosis not present

## 2016-08-16 DIAGNOSIS — Z7189 Other specified counseling: Secondary | ICD-10-CM

## 2016-08-16 DIAGNOSIS — C3411 Malignant neoplasm of upper lobe, right bronchus or lung: Secondary | ICD-10-CM

## 2016-08-16 DIAGNOSIS — D531 Other megaloblastic anemias, not elsewhere classified: Secondary | ICD-10-CM

## 2016-08-16 DIAGNOSIS — Z95828 Presence of other vascular implants and grafts: Secondary | ICD-10-CM

## 2016-08-16 DIAGNOSIS — Z9981 Dependence on supplemental oxygen: Secondary | ICD-10-CM

## 2016-08-16 DIAGNOSIS — Z79899 Other long term (current) drug therapy: Secondary | ICD-10-CM

## 2016-08-16 DIAGNOSIS — N289 Disorder of kidney and ureter, unspecified: Secondary | ICD-10-CM

## 2016-08-16 DIAGNOSIS — I10 Essential (primary) hypertension: Secondary | ICD-10-CM

## 2016-08-16 LAB — CBC WITH DIFFERENTIAL/PLATELET
Basophils Absolute: 0 10*3/uL (ref 0–0.1)
Basophils Relative: 0 %
Eosinophils Absolute: 0.1 10*3/uL (ref 0–0.7)
Eosinophils Relative: 1 %
HCT: 28.1 % — ABNORMAL LOW (ref 40.0–52.0)
Hemoglobin: 9.3 g/dL — ABNORMAL LOW (ref 13.0–18.0)
Lymphocytes Relative: 14 %
Lymphs Abs: 1.6 10*3/uL (ref 1.0–3.6)
MCH: 33.9 pg (ref 26.0–34.0)
MCHC: 33 g/dL (ref 32.0–36.0)
MCV: 102.8 fL — ABNORMAL HIGH (ref 80.0–100.0)
Monocytes Absolute: 1 10*3/uL (ref 0.2–1.0)
Monocytes Relative: 9 %
Neutro Abs: 8.7 10*3/uL — ABNORMAL HIGH (ref 1.4–6.5)
Neutrophils Relative %: 76 %
Platelets: 162 10*3/uL (ref 150–440)
RBC: 2.73 MIL/uL — ABNORMAL LOW (ref 4.40–5.90)
RDW: 18.1 % — ABNORMAL HIGH (ref 11.5–14.5)
WBC: 11.4 10*3/uL — ABNORMAL HIGH (ref 3.8–10.6)

## 2016-08-16 LAB — COMPREHENSIVE METABOLIC PANEL
ALT: 10 U/L — ABNORMAL LOW (ref 17–63)
AST: 23 U/L (ref 15–41)
Albumin: 2.9 g/dL — ABNORMAL LOW (ref 3.5–5.0)
Alkaline Phosphatase: 138 U/L — ABNORMAL HIGH (ref 38–126)
Anion gap: 9 (ref 5–15)
BUN: 17 mg/dL (ref 6–20)
CO2: 27 mmol/L (ref 22–32)
Calcium: 8.6 mg/dL — ABNORMAL LOW (ref 8.9–10.3)
Chloride: 103 mmol/L (ref 101–111)
Creatinine, Ser: 2.43 mg/dL — ABNORMAL HIGH (ref 0.61–1.24)
GFR calc Af Amer: 33 mL/min — ABNORMAL LOW (ref >60)
GFR calc non Af Amer: 28 mL/min — ABNORMAL LOW (ref >60)
Glucose, Bld: 109 mg/dL — ABNORMAL HIGH (ref 65–99)
Potassium: 3.7 mmol/L (ref 3.5–5.1)
Sodium: 139 mmol/L (ref 135–145)
Total Bilirubin: 0.5 mg/dL (ref 0.3–1.2)
Total Protein: 6.9 g/dL (ref 6.5–8.1)

## 2016-08-16 MED ORDER — SODIUM CHLORIDE 0.9% FLUSH
10.0000 mL | INTRAVENOUS | Status: DC | PRN
  Administered 2016-08-16: 10 mL via INTRAVENOUS
  Filled 2016-08-16: qty 10

## 2016-08-16 MED ORDER — HEPARIN SOD (PORK) LOCK FLUSH 100 UNIT/ML IV SOLN
500.0000 [IU] | Freq: Once | INTRAVENOUS | Status: AC
  Administered 2016-08-16: 500 [IU] via INTRAVENOUS

## 2016-08-16 NOTE — Progress Notes (Signed)
[facility] day:  08/16/2016   Chief Complaint: Stuart Spence is a 56 y.o. male with metastatic lung cancer who is seen for assessment prior to cycle #2 Taxotere.  HPI:  The patient was last seen by me in the medical oncology clinic on 08/02/2016.  At that time, he was seen on day 15 of cycle #1 Taxotere.  He was feeling better.  Mucositis and thrush had resolved.  He has had some recurrent rectal bleeding. Creatinine was 1.91.  He was scheduled to follow-up with Dr. Gustavo Lah for rectal bleeding.  He had an appointment with nephrology on 08/17/2016.  He was seen by Dr. Grayland Ormond in my absence on 08/09/2016.  He declined chemotherapy secondary to general weakness and fatigue.  Foundation One liquid was pending.  He was not eligible for a clinical trial.  Hospice was discussed.  He met with Dr. Lissa Morales at [facility] on 08/06/2016 for second opinion.  Recommendation was for continuation of Taxotere.  Based on Foundation One testing, he was not a candidate for a FDA approved or experimental targeted therapies.  A blood sample was collected to determine eligibility for the [employer] T cell based study.  Symptomatically, he feels good.  He feels like he has gained weight.  He denies any shortness of breath.  He sees nephrology tomorrow regarding his elevated creatinine.  He denies any pain. He has had no recent rectal bleeding.  He has no follow-up with Dr. Gustavo Lah.  He states that he "can't take Taxotere".   Past Medical History:  Diagnosis Date  . Cancer ([place])    Lung  . Depression   . ED (erectile dysfunction)   . Gout   . Hypertension   . On home oxygen therapy   . Personal history of chemotherapy    PT TAKING CHEMO EVERY 3 WEEKS    Past Surgical History:  Procedure Laterality Date  . CHEST TUBE INSERTION Left 08/13/2015   Procedure: CHEST TUBE INSERTION;  Surgeon: Nestor Lewandowsky, MD;  Location: [facility] ORS;  Service: General;  Laterality: Left;   . CHEST TUBE INSERTION N/A 09/18/2015   Procedure: PLEURX CATH REMOVAL;  Surgeon: Nestor Lewandowsky, MD;  Location: [facility] ORS;  Service: Thoracic;  Laterality: N/A;  . PORTACATH PLACEMENT Left 08/13/2015   Procedure: INSERTION PORT-A-CATH;  Surgeon: Nestor Lewandowsky, MD;  Location: [facility] ORS;  Service: General;  Laterality: Left;  . PORTACATH PLACEMENT    . TONSILLECTOMY      Family History  Problem Relation Age of Onset  . Cancer Mother 40       lung  . Heart attack Father 34  . Hypertension Father   . Congestive Heart Failure Father 15       died from  . Diabetes Sister     Social History:  reports that he has never smoked. He has never used smokeless tobacco. He reports that he does not drink alcohol or use drugs.  He has worked 26 years in the [employer].He notes exposure to chemicals.   He recently switched jobs.  He is not working.  He had previously planned on helping to build 2 houses.  Patient's wife name is [name].  He is accompanied by his wife today.  Allergies: No Known Allergies  Current Medications: Current Outpatient Prescriptions  Medication Sig Dispense Refill  . albuterol (PROVENTIL HFA;VENTOLIN HFA) 108 (90 Base) MCG/ACT inhaler Inhale 1-2 puffs into the lungs See admin instructions. Reported on 09/12/2015    .  amLODipine (NORVASC) 5 MG tablet Take 1 tablet (5 mg total) by mouth daily. 30 tablet 12  . benazepril (LOTENSIN) 40 MG tablet Take 40 mg by mouth daily.    . Calcium Carbonate (CALCIUM 600 PO) Take 1,200 mg by mouth 2 (two) times daily.    . colchicine 0.6 MG tablet Take 0.6 mg by mouth daily as needed (two pills by mouth at onset, the one pill one hour later, maximum three pills per gout flare).    Marland Kitchen dexamethasone (DECADRON) 4 MG tablet Take 2 tablets (8 mg total) by mouth 2 (two) times daily. Start the day before Taxotere. Then daily after chemo for 2 days. 30 tablet 1  . Dextromethorphan Polistirex (DELSYM PO) Take by mouth.    Marland Kitchen FLUoxetine (PROZAC) 20 MG  capsule Take 2 capsules (40 mg total) by mouth every morning. 60 capsule 12  . folic acid (FOLVITE) 1 MG tablet Take 1 tablet (1 mg total) by mouth daily. 30 tablet 3  . lidocaine (XYLOCAINE) 2 % solution Use as directed 15 mLs in the mouth or throat every 4 (four) hours as needed for mouth pain. 100 mL 0  . lidocaine-prilocaine (EMLA) cream Apply 1 application topically as needed. 30 g 2  . LORazepam (ATIVAN) 0.5 MG tablet Take 1 tablet (0.5 mg total) by mouth every 6 (six) hours as needed. For nausea 20 tablet 0  . nystatin (NYSTATIN) powder Apply topically 3 (three) times daily as needed. 30 g 1  . ondansetron (ZOFRAN) 8 MG tablet Take 1 tablet (8 mg total) by mouth every 8 (eight) hours as needed for nausea or vomiting. 20 tablet 3  . oxyCODONE (ROXICODONE) 5 MG immediate release tablet Take 1 tablet (5 mg total) by mouth every 8 (eight) hours as needed. 20 tablet 0  . OXYGEN Inhale 2 L into the lungs continuous. Reported on 09/12/2015    . pantoprazole (PROTONIX) 40 MG tablet Take 1 tablet (40 mg total) by mouth daily. 30 tablet 1  . sildenafil (VIAGRA) 100 MG tablet Take 1 tablet (100 mg total) by mouth daily as needed for erectile dysfunction. 10 tablet 12   Current Facility-Administered Medications  Medication Dose Route Frequency Provider Last Rate Last Dose  . dexamethasone (DECADRON) injection 10 mg  10 mg Intravenous Once Lequita Asal, MD        Review of Systems:  GENERAL: Feels good.  No fevers or sweats.  Weight down 2 pounds since last visit. PERFORMANCE STATUS (ECOG): 1 HEENT: No mouth sores or tenderness.  No sore throat.  No visual changes or runny nose. Lungs: No shortness of breath or cough. No hemoptysis.   Cardiac: No chest pain, palpitations, orthopnea, or PND. GI:  No interval rectal bleeding.  No diarrhea or constipation.  No melena.No prior colonoscopy. GU: No urgency, frequency, dysuria, or hematuria.  Musculoskeletal: No back pain. No joint pain.  No muscle tenderness. Extremities: No pain or swelling. Skin: No rashes or ulcers. Neuro: No headache, numbness or weakness, balance or coordination issues. Endocrine: No diabetes, thyroid issues, hot flashes or night sweats. Psych: No mood changes, depression or anxiety. Pain: No focal pain. Review of systems: All other systems reviewed and found to be negative.  Physical Exam: Blood pressure 114/70, pulse (!) 101, temperature 97 F (36.1 C), temperature source Tympanic, resp. rate 20, height '5\' 6"'$  (1.676 m), weight 218 lb 6.4 oz (99.1 kg), SpO2 96 %. GENERAL: Slightly fatigued appearing gentleman sitting comfortably in the exam room in  no acute distress. MENTAL STATUS: Alert and oriented to person, place and time. HEAD: Short dark thin hair. Normocephalic, atraumatic, face symmetric, no Cushingoid features. EYES: Oval glasses. Pupils equal round and reactive to light and accomodation. No conjunctivitis or scleral icterus. ENT: Oropharynx with no mucositis or thrush. Secretions normal.  Tongue normal. Mucous membranes moist.  RESPIRATORY: Clear to auscultation without rales, wheezes or rhonchi. CARDIOVASCULAR: Regular rate and rhythm without murmur, rub or gallop. ABDOMEN: Soft, non-tender, with active bowel sounds, and no hepatosplenomegaly. No masses. SKIN: No rashes or ulcers. EXTREMITIES: Mild chronic lower extremity changes.  No skin discoloration or tenderness. No palpable cords. LYMPH NODES: No palpable cervical, supraclavicular, axillary or inguinal adenopathy  NEUROLOGICAL: Unremarkable. PSYCH: Appropriate.  Imaging studies: 08/10/2015:  Chest CT angiogram revealed and ill-defined 4.5 x 4.5 cm area of hypodensity in the right upper lobe with small areas of air bronchogram. This was incompletely characterized but is concerning for a centrally obstructing mass. There was complete occlusion of the right upper, right middle, and right lower lobe bronchi with  complete collapse of the right lung. There was a large right pleural effusion. There was a lytic lesion involving the right fourth rib compatible with metastatic disease. There was no CT evidence of pulmonary embolism. 08/20/2015:  PET scan revealed a hypermetabolic 5.2 cm medial right upper lobe lung mass, consistent with primary bronchogenic carcinoma, with a broad attachment to the medial right upper lobe pleura.  There was interlobular septal thickening throughout the right lung suggested a component of lymphangitic tumor.  There was a malignant small right pleural effusion with hypermetabolism throughout the right pleural space.  There was hypermetabolic ipsilateral hilar, subcarinal and ipsilateral mediastinal nodal metastases.  There was extensive hypermetabolic lytic osseous metastases throughout the axial and proximal appendicular skeleton.  There were numerous hypermetabolic faintly lytic osseous metastases in the left humeral head (SUV 6.8), right acromion, bilateral ribs most prominent in the anterior right fourth rib  (SUV 16.0), thoracolumbar spine (T11 vertebral body with SUV 9.4), bilateral iliac bones (medial right iliac bone with SUV 12.1 and left anterior acetabulum with SUV 10.0), and right proximal femur (SUV 10.0 in the region of the right lesser trochanter).  11/20/2015:  PET scan revealed a marked positive response to therapy.  There was decrease in size and metabolic activity of RIGHT perihilar mass, resolution of mediastinal nodal metabolic activity, and marked decrease in size and metabolic activity of RIGHT anterior chest wall metastasis.  There was decreased in metabolic activity of multiple skeletal metastasis.   01/16/2016:  PET scan revealed a marked interval progression of disease compared with previous exam.  There was progression of multifocal hypermetabolic bone metastases.  There was the interval enlargement and right lung perihilar mass with development of pleural spread  of tumor and lymphangitic spread of tumor within the right lung.  There was new hypermetabolic and enlarged sub-carinal lymph node. 05/05/2016:  PET scan revealed a positive response to therapy with some regression of the right upper lobe mass, and decreasing lymphangitic spread of disease in the right lung, decreasing malignant right pleural effusion, and a mixed response of osseous lesions (more regression than progression).  A lesion in the posterior aspect of T11 measured 3.0 x 3.4 cm (previously 2.2 x 2.4 cm), but was no longer hypermetabolic.  The largest persistent lesion hypermetabolic lesions are in the pelvis in the posterior aspect of the left ilium and in the right side of the sacrum, are more lytic and sclerotic in appearance,  with the largest of these lesions in the right side of the sacrum measuring up to 2.7 cm (SUV 10.5). 07/08/2016:  PET scan revealed worsening metastatic disease, with a significant increase in osseous metastatic tumor burden and innumerable new bony lesions, as well.  There was increased activity along the right pleural effusion and adjacent lung suspicious for pleural metastatic disease. The lung nodules and thoracic lymph nodes remained similarly hypermetabolic and similar in size.   Appointment on 08/16/2016  Component Date Value Ref Range Status  . WBC 08/16/2016 11.4* 3.8 - 10.6 K/uL Final  . RBC 08/16/2016 2.73* 4.40 - 5.90 MIL/uL Final  . Hemoglobin 08/16/2016 9.3* 13.0 - 18.0 g/dL Final  . HCT 08/16/2016 28.1* 40.0 - 52.0 % Final  . MCV 08/16/2016 102.8* 80.0 - 100.0 fL Final  . MCH 08/16/2016 33.9  26.0 - 34.0 pg Final  . MCHC 08/16/2016 33.0  32.0 - 36.0 g/dL Final  . RDW 08/16/2016 18.1* 11.5 - 14.5 % Final  . Platelets 08/16/2016 162  150 - 440 K/uL Final  . Neutrophils Relative % 08/16/2016 76  % Final  . Neutro Abs 08/16/2016 8.7* 1.4 - 6.5 K/uL Final  . Lymphocytes Relative 08/16/2016 14  % Final  . Lymphs Abs 08/16/2016 1.6  1.0 - 3.6 K/uL Final   . Monocytes Relative 08/16/2016 9  % Final  . Monocytes Absolute 08/16/2016 1.0  0.2 - 1.0 K/uL Final  . Eosinophils Relative 08/16/2016 1  % Final  . Eosinophils Absolute 08/16/2016 0.1  0 - 0.7 K/uL Final  . Basophils Relative 08/16/2016 0  % Final  . Basophils Absolute 08/16/2016 0.0  0 - 0.1 K/uL Final  . Sodium 08/16/2016 139  135 - 145 mmol/L Final  . Potassium 08/16/2016 3.7  3.5 - 5.1 mmol/L Final  . Chloride 08/16/2016 103  101 - 111 mmol/L Final  . CO2 08/16/2016 27  22 - 32 mmol/L Final  . Glucose, Bld 08/16/2016 109* 65 - 99 mg/dL Final  . BUN 08/16/2016 17  6 - 20 mg/dL Final  . Creatinine, Ser 08/16/2016 2.43* 0.61 - 1.24 mg/dL Final  . Calcium 08/16/2016 8.6* 8.9 - 10.3 mg/dL Final  . Total Protein 08/16/2016 6.9  6.5 - 8.1 g/dL Final  . Albumin 08/16/2016 2.9* 3.5 - 5.0 g/dL Final  . AST 08/16/2016 23  15 - 41 U/L Final  . ALT 08/16/2016 10* 17 - 63 U/L Final  . Alkaline Phosphatase 08/16/2016 138* 38 - 126 U/L Final  . Total Bilirubin 08/16/2016 0.5  0.3 - 1.2 mg/dL Final  . GFR calc non Af Amer 08/16/2016 28* >60 mL/min Final  . GFR calc Af Amer 08/16/2016 33* >60 mL/min Final   Comment: (NOTE) The eGFR has been calculated using the CKD EPI equation. This calculation has not been validated in all clinical situations. eGFR's persistently <60 mL/min signify possible Chronic Kidney Disease.   . Anion gap 08/16/2016 9  5 - 15 Final    Assessment:  Stuart Spence is a 56 y.o. male with stage IV adenocarcinoma of the right lung.  He has no smoking history.  He presented with with a large right sided pleural effusion and an ill-defined 4.5 x 4.5 cm mass in the right upper lobe. Thoracentesis x 2 of approximately 4.7 liters of blood fluid revealed adenocarcinoma. TTF-1 and Napsin A were immunoreactive and consistent with a lung primary. Pleur-X catheter was placed on 08/13/2015 (removed 09/18/2015).  [facility] testing on 08/18/2015 was negative for:  EGFR,  ALK, ROS1 and RET.   Foundation One testing revealed ERBB2 amplification equivocal, RICTOR amplification, APC [id number], CDKN2A/B loss, microsatellite stable, tumor mutation burden intermediate (6 Muts/Mb).  EGFR, K-RAS, ALK, BRAF, MET and RET were negative.  Plain films on 08/21/2015 revealed a left humerus lytic lesion with some thinning and some absence of the cortical margin.  He received 800 cGy to the left humerus on 09/17/2015.  He received 4 cycles of Keytruda, carboplatin and Alimta (08/22/2015 - 10/31/2015).   He received 3 cycles of single agent Keytruda (11/21/2015 - 01/02/2016).  Pleur-X catheter was removed on 09/18/2015.    He received 6 additional cycles of carboplatin and Alimta (01/23/2016 -  05/07/2016).  He has received 4 cycles of Avastin (03/05/2016 - 05/07/2016).    He received 2 cycles of maintenance Alimta and Avastin (05/28/2016 - 06/18/2016).  He received B12 every 9 weeks (last 06/18/2016).  He receives monthly Xgeva (began 08/22/2015; last 05/28/2016).   PET scan on 07/08/2016 revealed worsening metastatic disease, with a significant increase in osseous metastatic tumor burden and innumerable new bony lesions, as well.  There was increased activity along the right pleural effusion and adjacent lung suspicious for pleural metastatic disease. The lung nodules and thoracic lymph nodes remained similarly hypermetabolic and similar in size.  CEA was 837.1 on 09/12/2015, 448 on 10/10/2015, 346 on 10/31/2015, 261.3 on 11/21/2015, 726.2 on 01/02/2016, 857.2 on 01/09/2016, 1458.0 on 01/23/2016, 1350.0 on 01/30/2016, 999.6 on 02/13/2016, 870.8 on 03/04/2016, 671.9 on 03/25/2016, 587.6 on 04/16/2016, 510.6 on 05/07/2016, 512.5 on 05/27/2016, 504.1 on 05/28/2016, 596.3 on 06/17/2016, 853.1 on 07/08/2016, 932.1 on 07/19/2016, and 1233 on 08/09/2016.  He is s/p cycle #1 Taxotere (07/19/2016) with Neulasta support.  Cycle #1 was complicated by nausea, vomiting, dehydration, mucositis, and  thrush.  He was admitted to [facility] from 06/23/2016 - 06/25/2016 with rectal bleeding.  Bleeding was felt possibly hemorrhoidal.  He had shingles on his left mid abdomen.  He was discharged on Valtrex.  He was to schedule an outpatient colonoscopy.  He was admitted to [facility] from 07/24/2016 - 07/27/2016 with nausea, vomiting, diarrhea and mucosiitis.  He was treated with IVF, anti-emetics, Magic Mouthwash, and Imodium.  He has a macrocytic anemia.  Etiology is likely multi-factorial secondary to chronic disease, rectal bleeding, and renal insufficiency.  Labs on 07/19/2016 revealed a ferritin of 1116, iron saturation of 40% and a TIBC of 265.  He has renal insufficiency of unclear etiology.  Renal ultrasound on 07/29/2016 revealed no acute abnormality.  Creatinine is 2.16 (CrCl 33 ml/min).  He has a history of rectal bleeding of unclear etiology.  He has never had a colonoscopy.  Symptomatically, he feels good.  Exam is stable. Albumen is 2.9.  Creatinine is 2.43.  Plan: 1.  Labs today:  CBC with diff, CMP. 2.  Discuss consult with Dr. Aniceto Boss and recommendation to continue Taxotere until possible future clinical trial.  Patient declines despite planned dose reduction of Taxotere. 3.  Follow-up Liquid Foundation One testing. 4.  No chemotherapy today. 5.  Patient to follow-up with nephrology on 08/17/2016. 6.  Contact Dr. Marton Redwood office re: colonoscopy. 7.  RTC in 2 weeks for MD assessment and labs (CBC with diff, BMP).   Lequita Asal, MD  08/16/2016, 10:18 AM

## 2016-08-16 NOTE — Progress Notes (Signed)
Patient here for pre treatment check. He states there have been no changes since last appointment.

## 2016-08-17 ENCOUNTER — Telehealth: Payer: Self-pay | Admitting: *Deleted

## 2016-08-17 NOTE — Telephone Encounter (Signed)
Patient's wife stopped by the office today with concerns that when they were seen @ Dr. Percell [place] office that patient would not be able to have his colonoscopy/endoscopy for another 3 months.  She is asking if there is anything Dr. Mike Gip can do to expedite the appointment.   Dr. Mike Gip called and spoke to Tammi Klippel, PA who told her they can place the patient on their cancellation list and if they have a cancellation they will call him and get him in earlier.  I called the patient and relayed the message.  Patient in agreement.

## 2016-08-19 ENCOUNTER — Inpatient Hospital Stay: Payer: No Typology Code available for payment source

## 2016-08-19 VITALS — BP 122/72 | HR 67 | Temp 98.3°F | Resp 18

## 2016-08-19 DIAGNOSIS — N179 Acute kidney failure, unspecified: Secondary | ICD-10-CM

## 2016-08-19 DIAGNOSIS — C3411 Malignant neoplasm of upper lobe, right bronchus or lung: Secondary | ICD-10-CM | POA: Diagnosis not present

## 2016-08-19 MED ORDER — SODIUM CHLORIDE 0.9 % IV SOLN
Freq: Once | INTRAVENOUS | Status: AC
  Administered 2016-08-19: 12:00:00 via INTRAVENOUS
  Filled 2016-08-19: qty 1000

## 2016-08-19 MED ORDER — HEPARIN SOD (PORK) LOCK FLUSH 100 UNIT/ML IV SOLN: 500.0000 [IU] | Freq: Once | INTRAVENOUS | Status: DC

## 2016-08-19 MED ORDER — SODIUM CHLORIDE 0.9 % IV SOLN
Freq: Once | INTRAVENOUS | Status: DC
  Filled 2016-08-19: qty 1000

## 2016-08-20 ENCOUNTER — Inpatient Hospital Stay: Payer: No Typology Code available for payment source

## 2016-08-20 VITALS — BP 135/78 | HR 65 | Temp 98.1°F | Resp 18

## 2016-08-20 DIAGNOSIS — E860 Dehydration: Secondary | ICD-10-CM

## 2016-08-20 DIAGNOSIS — C3411 Malignant neoplasm of upper lobe, right bronchus or lung: Secondary | ICD-10-CM | POA: Diagnosis not present

## 2016-08-20 MED ORDER — HEPARIN SOD (PORK) LOCK FLUSH 100 UNIT/ML IV SOLN
500.0000 [IU] | Freq: Once | INTRAVENOUS | Status: AC
  Administered 2016-08-20: 500 [IU] via INTRAVENOUS

## 2016-08-20 MED ORDER — SODIUM CHLORIDE 0.9 % IV SOLN
Freq: Once | INTRAVENOUS | Status: AC
  Administered 2016-08-20: 09:00:00 via INTRAVENOUS
  Filled 2016-08-20: qty 1000

## 2016-08-20 MED ORDER — HEPARIN SOD (PORK) LOCK FLUSH 100 UNIT/ML IV SOLN
INTRAVENOUS | Status: AC
  Filled 2016-08-20: qty 5

## 2016-08-30 NOTE — Progress Notes (Signed)
[facility] day:  08/31/2016   Chief Complaint: Stuart Spence is a 56 y.o. male with metastatic lung cancer who is seen for 2 week assessment.  HPI:  The patient was last seen in the medical oncology clinic on 08/16/2016.  At that time, he declined further Taxotere despite planned dose reduction.  Creatinine was elevated (2.49).  Nephrology consult was pending.  He was also awaiting GI follow-up for rectal bleeding.  He was seen by nephrology on 08/17/2016.  All of his blood pressure medications were discontinued except for his amlodipine.  Symptomatically, he notes shortness of breath.  He is on oxygen now.  He is able to walk to the mailbox.  Sleep is poor.  He has new back pain.  His head hurts.  He denies any focal weakness or numbness.  He had an episode of emesis on Friday, 08/27/2016.  He was nauseated on 08/28/2016.   Past Medical History:  Diagnosis Date  . Cancer ([place])    Lung  . Depression   . ED (erectile dysfunction)   . Gout   . Hypertension   . On home oxygen therapy   . Personal history of chemotherapy    PT TAKING CHEMO EVERY 3 WEEKS    Past Surgical History:  Procedure Laterality Date  . CHEST TUBE INSERTION Left 08/13/2015   Procedure: CHEST TUBE INSERTION;  Surgeon: Nestor Lewandowsky, MD;  Location: [facility] ORS;  Service: General;  Laterality: Left;  . CHEST TUBE INSERTION N/A 09/18/2015   Procedure: PLEURX CATH REMOVAL;  Surgeon: Nestor Lewandowsky, MD;  Location: [facility] ORS;  Service: Thoracic;  Laterality: N/A;  . PORTACATH PLACEMENT Left 08/13/2015   Procedure: INSERTION PORT-A-CATH;  Surgeon: Nestor Lewandowsky, MD;  Location: [facility] ORS;  Service: General;  Laterality: Left;  . PORTACATH PLACEMENT    . TONSILLECTOMY      Family History  Problem Relation Age of Onset  . Cancer Mother 46       lung  . Heart attack Father 56  . Hypertension Father   . Congestive Heart Failure Father 33       died from  . Diabetes Sister     Social  History:  reports that he has never smoked. He has never used smokeless tobacco. He reports that he does not drink alcohol or use drugs.  He has worked 85 years in the [employer].He notes exposure to chemicals.   He recently switched jobs.  He is not working.  He had previously planned on helping to build 2 houses.  Patient's wife name is [name].  He is accompanied by his wife today.  Allergies: No Known Allergies  Current Medications: Current Outpatient Prescriptions  Medication Sig Dispense Refill  . albuterol (PROVENTIL HFA;VENTOLIN HFA) 108 (90 Base) MCG/ACT inhaler Inhale 1-2 puffs into the lungs See admin instructions. Reported on 09/12/2015    . amLODipine (NORVASC) 5 MG tablet Take 1 tablet (5 mg total) by mouth daily. 30 tablet 12  . Calcium Carbonate (CALCIUM 600 PO) Take 1,200 mg by mouth 2 (two) times daily.    . colchicine 0.6 MG tablet Take 0.6 mg by mouth daily as needed (two pills by mouth at onset, the one pill one hour later, maximum three pills per gout flare).    Marland Kitchen dexamethasone (DECADRON) 4 MG tablet Take 2 tablets (8 mg total) by mouth 2 (two) times daily. Start the day before Taxotere. Then daily after chemo for 2 days. 30 tablet  1  . Dextromethorphan Polistirex (DELSYM PO) Take by mouth.    Marland Kitchen FLUoxetine (PROZAC) 20 MG capsule Take 2 capsules (40 mg total) by mouth every morning. 60 capsule 12  . folic acid (FOLVITE) 1 MG tablet Take 1 tablet (1 mg total) by mouth daily. 30 tablet 3  . lidocaine (XYLOCAINE) 2 % solution Use as directed 15 mLs in the mouth or throat every 4 (four) hours as needed for mouth pain. 100 mL 0  . lidocaine-prilocaine (EMLA) cream Apply 1 application topically as needed. 30 g 2  . LORazepam (ATIVAN) 0.5 MG tablet Take 1 tablet (0.5 mg total) by mouth every 6 (six) hours as needed. For nausea 20 tablet 0  . nystatin (NYSTATIN) powder Apply topically 3 (three) times daily as needed. 30 g 1  . ondansetron (ZOFRAN) 8 MG tablet Take 1 tablet (8 mg  total) by mouth every 8 (eight) hours as needed for nausea or vomiting. 20 tablet 3  . oxyCODONE (ROXICODONE) 5 MG immediate release tablet Take 1 tablet (5 mg total) by mouth every 8 (eight) hours as needed. 20 tablet 0  . OXYGEN Inhale 2 L into the lungs continuous. Reported on 09/12/2015    . pantoprazole (PROTONIX) 40 MG tablet Take 1 tablet (40 mg total) by mouth daily. 30 tablet 1  . sildenafil (VIAGRA) 100 MG tablet Take 1 tablet (100 mg total) by mouth daily as needed for erectile dysfunction. 10 tablet 12  . benazepril (LOTENSIN) 40 MG tablet Take 40 mg by mouth daily.     Current Facility-Administered Medications  Medication Dose Route Frequency Provider Last Rate Last Dose  . dexamethasone (DECADRON) injection 10 mg  10 mg Intravenous Once Lequita Asal, MD        Review of Systems:  GENERAL: Fatigue  No fevers or sweats.  Weight stable. PERFORMANCE STATUS (ECOG): 2 HEENT: No mouth sores or tenderness.  No sore throat.  No visual changes or runny nose. Lungs:  Shortness of breath.  No cough. No hemoptysis.   Cardiac: No chest pain, palpitations, orthopnea, or PND. GI:  Emesis.  No rectal bleeding.  No diarrhea or constipation.  No melena.No prior colonoscopy. GU: No urgency, frequency, dysuria, or hematuria.  Musculoskeletal: Ne back pain. No joint pain. No muscle tenderness. Extremities: No pain or swelling. Skin: No rashes or ulcers. Neuro: Headaches (see HPI).  No numbness or weakness, balance or coordination issues. Endocrine: No diabetes, thyroid issues, hot flashes or night sweats. Psych: No mood changes, depression or anxiety.  Poor sleep. Pain: No focal pain. Review of systems: All other systems reviewed and found to be negative.  Physical Exam: Blood pressure (!) 136/97, pulse (!) 101, temperature (!) 96.4 F (35.8 C), temperature source Tympanic, weight 218 lb (98.9 kg). GENERAL:Fatigued appearing gentleman sitting comfortably in the exam  room in no acute distress. MENTAL STATUS: Alert and oriented to person, place and time. HEAD: Short dark thin hair. Normocephalic, atraumatic, face symmetric, no Cushingoid features. EYES: Oval glasses. Pupils equal round and reactive to light and accomodation. No conjunctivitis or scleral icterus. ENT: [place] in place.  Oropharynx with no mucositis or thrush. Secretions normal.  Tongue normal. Mucous membranes moist.  RESPIRATORY: Clear to auscultation without rales, wheezes or rhonchi. CARDIOVASCULAR: Regular rate and rhythm without murmur, rub or gallop. ABDOMEN: Soft, non-tender, with active bowel sounds, and no hepatosplenomegaly. No masses. BACK:  Lumbar spine pain. SKIN: No rashes or ulcers. EXTREMITIES: Mild chronic lower extremity changes.  No skin discoloration  or tenderness. No palpable cords. LYMPH NODES: No palpable cervical, supraclavicular, axillary or inguinal adenopathy  NEUROLOGICAL: Alert & oriented, cranial nerves II-XII intact; motor strength 5/5 throughout; sensation intact; finger to nose and RAM normal; no clonus or Babinski.  PSYCH: Appropriate.  Imaging studies: 08/10/2015:  Chest CT angiogram revealed and ill-defined 4.5 x 4.5 cm area of hypodensity in the right upper lobe with small areas of air bronchogram. This was incompletely characterized but is concerning for a centrally obstructing mass. There was complete occlusion of the right upper, right middle, and right lower lobe bronchi with complete collapse of the right lung. There was a large right pleural effusion. There was a lytic lesion involving the right fourth rib compatible with metastatic disease. There was no CT evidence of pulmonary embolism. 08/20/2015:  PET scan revealed a hypermetabolic 5.2 cm medial right upper lobe lung mass, consistent with primary bronchogenic carcinoma, with a broad attachment to the medial right upper lobe pleura.  There was interlobular septal thickening throughout  the right lung suggested a component of lymphangitic tumor.  There was a malignant small right pleural effusion with hypermetabolism throughout the right pleural space.  There was hypermetabolic ipsilateral hilar, subcarinal and ipsilateral mediastinal nodal metastases.  There was extensive hypermetabolic lytic osseous metastases throughout the axial and proximal appendicular skeleton.  There were numerous hypermetabolic faintly lytic osseous metastases in the left humeral head (SUV 6.8), right acromion, bilateral ribs most prominent in the anterior right fourth rib  (SUV 16.0), thoracolumbar spine (T11 vertebral body with SUV 9.4), bilateral iliac bones (medial right iliac bone with SUV 12.1 and left anterior acetabulum with SUV 10.0), and right proximal femur (SUV 10.0 in the region of the right lesser trochanter).  08/27/2015:  Head CT revealed no evidence of metastatic disease. 11/20/2015:  PET scan revealed a marked positive response to therapy.  There was decrease in size and metabolic activity of RIGHT perihilar mass, resolution of mediastinal nodal metabolic activity, and marked decrease in size and metabolic activity of RIGHT anterior chest wall metastasis.  There was decreased in metabolic activity of multiple skeletal metastasis.   01/16/2016:  PET scan revealed a marked interval progression of disease compared with previous exam.  There was progression of multifocal hypermetabolic bone metastases.  There was the interval enlargement and right lung perihilar mass with development of pleural spread of tumor and lymphangitic spread of tumor within the right lung.  There was new hypermetabolic and enlarged sub-carinal lymph node. 05/05/2016:  PET scan revealed a positive response to therapy with some regression of the right upper lobe mass, and decreasing lymphangitic spread of disease in the right lung, decreasing malignant right pleural effusion, and a mixed response of osseous lesions (more regression  than progression).  A lesion in the posterior aspect of T11 measured 3.0 x 3.4 cm (previously 2.2 x 2.4 cm), but was no longer hypermetabolic.  The largest persistent lesion hypermetabolic lesions are in the pelvis in the posterior aspect of the left ilium and in the right side of the sacrum, are more lytic and sclerotic in appearance, with the largest of these lesions in the right side of the sacrum measuring up to 2.7 cm (SUV 10.5). 07/08/2016:  PET scan revealed worsening metastatic disease, with a significant increase in osseous metastatic tumor burden and innumerable new bony lesions, as well.  There was increased activity along the right pleural effusion and adjacent lung suspicious for pleural metastaticdisease. The lung nodules and thoracic lymph nodes remained similarly hypermetabolic   and similar in size.   Appointment on 08/31/2016  Component Date Value Ref Range Status  . WBC 08/31/2016 15.2* 3.8 - 10.6 K/uL Final  . RBC 08/31/2016 3.38* 4.40 - 5.90 MIL/uL Final  . Hemoglobin 08/31/2016 10.7* 13.0 - 18.0 g/dL Final  . HCT 08/31/2016 33.0* 40.0 - 52.0 % Final  . MCV 08/31/2016 97.7  80.0 - 100.0 fL Final  . MCH 08/31/2016 31.6  26.0 - 34.0 pg Final  . MCHC 08/31/2016 32.4  32.0 - 36.0 g/dL Final  . RDW 08/31/2016 19.4* 11.5 - 14.5 % Final  . Platelets 08/31/2016 132* 150 - 440 K/uL Final  . Neutrophils Relative % 08/31/2016 80  % Final  . Neutro Abs 08/31/2016 12.2* 1.4 - 6.5 K/uL Final  . Lymphocytes Relative 08/31/2016 12  % Final  . Lymphs Abs 08/31/2016 1.8  1.0 - 3.6 K/uL Final  . Monocytes Relative 08/31/2016 7  % Final  . Monocytes Absolute 08/31/2016 1.0  0.2 - 1.0 K/uL Final  . Eosinophils Relative 08/31/2016 1  % Final  . Eosinophils Absolute 08/31/2016 0.1  0 - 0.7 K/uL Final  . Basophils Relative 08/31/2016 0  % Final  . Basophils Absolute 08/31/2016 0.1  0 - 0.1 K/uL Final  . Sodium 08/31/2016 136  135 - 145 mmol/L Final  . Potassium 08/31/2016 4.3  3.5 - 5.1 mmol/L  Final  . Chloride 08/31/2016 99* 101 - 111 mmol/L Final  . CO2 08/31/2016 27  22 - 32 mmol/L Final  . Glucose, Bld 08/31/2016 102* 65 - 99 mg/dL Final  . BUN 08/31/2016 22* 6 - 20 mg/dL Final  . Creatinine, Ser 08/31/2016 2.05* 0.61 - 1.24 mg/dL Final  . Calcium 08/31/2016 8.6* 8.9 - 10.3 mg/dL Final  . GFR calc non Af Amer 08/31/2016 35* >60 mL/min Final  . GFR calc Af Amer 08/31/2016 40* >60 mL/min Final   Comment: (NOTE) The eGFR has been calculated using the CKD EPI equation. This calculation has not been validated in all clinical situations. eGFR's persistently <60 mL/min signify possible Chronic Kidney Disease.   . Anion gap 08/31/2016 10  5 - 15 Final    Assessment:  Jaylan Hinojosa is a 56 y.o. male with stage IV adenocarcinoma of the right lung.  He has no smoking history.  He presented with with a large right sided pleural effusion and an ill-defined 4.5 x 4.5 cm mass in the right upper lobe. Thoracentesis x 2 of approximately 4.7 liters of blood fluid revealed adenocarcinoma. TTF-1 and Napsin A were immunoreactive and consistent with a lung primary. Pleur-X catheter was placed on 08/13/2015 (removed 09/18/2015).  [facility] testing on 08/18/2015 was negative for:  EGFR, ALK, ROS1 and RET.  [facility] testing revealed ERBB2 amplification equivocal, RICTOR amplification, APC [id number], CDKN2A/B loss, microsatellite stable, tumor mutation burden intermediate (6 Muts/Mb).  EGFR, K-RAS, ALK, BRAF, MET and RET were negative.  Plain films on 08/21/2015 revealed a left humerus lytic lesion with some thinning and some absence of the cortical margin.  He received 800 cGy to the left humerus on 09/17/2015.  He received 4 cycles of Keytruda, carboplatin and Alimta (08/22/2015 - 10/31/2015).   He received 3 cycles of single agent Keytruda (11/21/2015 - 01/02/2016).  Pleur-X catheter was removed on 09/18/2015.    He received 6 additional cycles of carboplatin and Alimta (01/23/2016 -   05/07/2016).  He has received 4 cycles of Avastin (03/05/2016 - 05/07/2016).    He received 2 cycles of maintenance Alimta and  Avastin (05/28/2016 - 06/18/2016).  He received B12 every 9 weeks (last 06/18/2016).  He receives monthly Xgeva (began 08/22/2015; last 05/28/2016).   PET scan on 07/08/2016 revealed worsening metastatic disease, with a significant increase in osseous metastatic tumor burden and innumerable new bony lesions, as well.  There was increased activity along the right pleural effusion and adjacent lung suspicious for pleural metastatic disease. The lung nodules and thoracic lymph nodes remained similarly hypermetabolic and similar in size.  CEA was 837.1 on 09/12/2015, 448 on 10/10/2015, 346 on 10/31/2015, 261.3 on 11/21/2015, 726.2 on 01/02/2016, 857.2 on 01/09/2016, 1458.0 on 01/23/2016, 1350.0 on 01/30/2016, 999.6 on 02/13/2016, 870.8 on 03/04/2016, 671.9 on 03/25/2016, 587.6 on 04/16/2016, 510.6 on 05/07/2016, 512.5 on 05/27/2016, 504.1 on 05/28/2016, 596.3 on 06/17/2016, 853.1 on 07/08/2016, 932.1 on 07/19/2016, and 1233 on 08/09/2016.  He is s/p cycle #1 Taxotere (07/19/2016) with Neulasta support.  Cycle #1 was complicated by nausea, vomiting, dehydration, mucositis, and thrush.  He was admitted to [facility] from 06/23/2016 - 06/25/2016 with rectal bleeding.  Bleeding was felt possibly hemorrhoidal.  He had shingles on his left mid abdomen.  He was discharged on Valtrex.  He was to schedule an outpatient colonoscopy.  He was admitted to [facility] from 07/24/2016 - 07/27/2016 with nausea, vomiting, diarrhea and mucosiitis.  He was treated with IVF, anti-emetics, Magic Mouthwash, and Imodium.  He has a macrocytic anemia.  Etiology is likely multi-factorial secondary to chronic disease, rectal bleeding, and renal insufficiency.  Labs on 07/19/2016 revealed a ferritin of 1116, iron saturation of 40% and a TIBC of 265.  He has renal insufficiency of unclear etiology.  Renal ultrasound on  07/29/2016 revealed no acute abnormality.  Creatinine is 2.05.  He has a history of rectal bleeding of unclear etiology.  He has never had a colonoscopy.  Symptomatically, he has new back pain and headaches.  Neurologic exam is normal.   Plan: 1.  Labs today:  CBC with diff, BMP. 2.  Discuss likely progressive disease off therapy.  Discuss imaging studies.  Goals of care reviewed. 3.  Head MRI without contrast ASAP. 4.  Lumbar spine MRI without contrast ASAP. 5.  RTC after above.   Lequita Asal, MD  08/31/2016, 10:08 AM

## 2016-08-31 ENCOUNTER — Inpatient Hospital Stay (HOSPITAL_BASED_OUTPATIENT_CLINIC_OR_DEPARTMENT_OTHER): Payer: No Typology Code available for payment source | Admitting: Hematology and Oncology

## 2016-08-31 ENCOUNTER — Inpatient Hospital Stay: Payer: No Typology Code available for payment source

## 2016-08-31 ENCOUNTER — Encounter: Payer: Self-pay | Admitting: Hematology and Oncology

## 2016-08-31 ENCOUNTER — Ambulatory Visit
Admission: RE | Admit: 2016-08-31 | Discharge: 2016-08-31 | Disposition: A | Discharge status: Home / Self Care | Payer: No Typology Code available for payment source | Source: Ambulatory Visit | Attending: Hematology and Oncology | Admitting: Hematology and Oncology

## 2016-08-31 VITALS — BP 136/97 | HR 101 | Temp 96.4°F | Wt 218.0 lb

## 2016-08-31 DIAGNOSIS — Z7189 Other specified counseling: Secondary | ICD-10-CM

## 2016-08-31 DIAGNOSIS — R51 Headache: Secondary | ICD-10-CM | POA: Insufficient documentation

## 2016-08-31 DIAGNOSIS — M545 Low back pain, unspecified: Secondary | ICD-10-CM

## 2016-08-31 DIAGNOSIS — C771 Secondary and unspecified malignant neoplasm of intrathoracic lymph nodes: Secondary | ICD-10-CM

## 2016-08-31 DIAGNOSIS — C7951 Secondary malignant neoplasm of bone: Secondary | ICD-10-CM | POA: Diagnosis not present

## 2016-08-31 DIAGNOSIS — M47896 Other spondylosis, lumbar region: Secondary | ICD-10-CM | POA: Diagnosis not present

## 2016-08-31 DIAGNOSIS — F418 Other specified anxiety disorders: Secondary | ICD-10-CM

## 2016-08-31 DIAGNOSIS — R112 Nausea with vomiting, unspecified: Secondary | ICD-10-CM

## 2016-08-31 DIAGNOSIS — R519 Headache, unspecified: Secondary | ICD-10-CM

## 2016-08-31 DIAGNOSIS — R110 Nausea: Secondary | ICD-10-CM

## 2016-08-31 DIAGNOSIS — Z79899 Other long term (current) drug therapy: Secondary | ICD-10-CM

## 2016-08-31 DIAGNOSIS — D649 Anemia, unspecified: Secondary | ICD-10-CM

## 2016-08-31 DIAGNOSIS — C3491 Malignant neoplasm of unspecified part of right bronchus or lung: Secondary | ICD-10-CM | POA: Diagnosis not present

## 2016-08-31 DIAGNOSIS — N289 Disorder of kidney and ureter, unspecified: Secondary | ICD-10-CM

## 2016-08-31 DIAGNOSIS — C3411 Malignant neoplasm of upper lobe, right bronchus or lung: Secondary | ICD-10-CM

## 2016-08-31 DIAGNOSIS — R5383 Other fatigue: Secondary | ICD-10-CM | POA: Diagnosis not present

## 2016-08-31 DIAGNOSIS — J90 Pleural effusion, not elsewhere classified: Secondary | ICD-10-CM

## 2016-08-31 DIAGNOSIS — Z9981 Dependence on supplemental oxygen: Secondary | ICD-10-CM | POA: Diagnosis not present

## 2016-08-31 DIAGNOSIS — K625 Hemorrhage of anus and rectum: Secondary | ICD-10-CM

## 2016-08-31 DIAGNOSIS — Z77098 Contact with and (suspected) exposure to other hazardous, chiefly nonmedicinal, chemicals: Secondary | ICD-10-CM

## 2016-08-31 DIAGNOSIS — Z9221 Personal history of antineoplastic chemotherapy: Secondary | ICD-10-CM

## 2016-08-31 DIAGNOSIS — M549 Dorsalgia, unspecified: Secondary | ICD-10-CM | POA: Diagnosis not present

## 2016-08-31 DIAGNOSIS — I10 Essential (primary) hypertension: Secondary | ICD-10-CM

## 2016-08-31 DIAGNOSIS — M109 Gout, unspecified: Secondary | ICD-10-CM

## 2016-08-31 DIAGNOSIS — Z85118 Personal history of other malignant neoplasm of bronchus and lung: Secondary | ICD-10-CM

## 2016-08-31 LAB — CBC WITH DIFFERENTIAL/PLATELET
Basophils Absolute: 0.1 10*3/uL (ref 0–0.1)
Basophils Relative: 0 %
Eosinophils Absolute: 0.1 10*3/uL (ref 0–0.7)
Eosinophils Relative: 1 %
HCT: 33 % — ABNORMAL LOW (ref 40.0–52.0)
Hemoglobin: 10.7 g/dL — ABNORMAL LOW (ref 13.0–18.0)
Lymphocytes Relative: 12 %
Lymphs Abs: 1.8 10*3/uL (ref 1.0–3.6)
MCH: 31.6 pg (ref 26.0–34.0)
MCHC: 32.4 g/dL (ref 32.0–36.0)
MCV: 97.7 fL (ref 80.0–100.0)
Monocytes Absolute: 1 10*3/uL (ref 0.2–1.0)
Monocytes Relative: 7 %
Neutro Abs: 12.2 10*3/uL — ABNORMAL HIGH (ref 1.4–6.5)
Neutrophils Relative %: 80 %
Platelets: 132 10*3/uL — ABNORMAL LOW (ref 150–440)
RBC: 3.38 MIL/uL — ABNORMAL LOW (ref 4.40–5.90)
RDW: 19.4 % — ABNORMAL HIGH (ref 11.5–14.5)
WBC: 15.2 10*3/uL — ABNORMAL HIGH (ref 3.8–10.6)

## 2016-08-31 LAB — BASIC METABOLIC PANEL
Anion gap: 10 (ref 5–15)
BUN: 22 mg/dL — ABNORMAL HIGH (ref 6–20)
CO2: 27 mmol/L (ref 22–32)
Calcium: 8.6 mg/dL — ABNORMAL LOW (ref 8.9–10.3)
Chloride: 99 mmol/L — ABNORMAL LOW (ref 101–111)
Creatinine, Ser: 2.05 mg/dL — ABNORMAL HIGH (ref 0.61–1.24)
GFR calc Af Amer: 40 mL/min — ABNORMAL LOW (ref >60)
GFR calc non Af Amer: 35 mL/min — ABNORMAL LOW (ref >60)
Glucose, Bld: 102 mg/dL — ABNORMAL HIGH (ref 65–99)
Potassium: 4.3 mmol/L (ref 3.5–5.1)
Sodium: 136 mmol/L (ref 135–145)

## 2016-08-31 MED ORDER — OXYCODONE HCL 5 MG PO TABS: 5.0000 mg | ORAL_TABLET | Freq: Three times a day (TID) | ORAL | 0 refills | Status: DC | PRN

## 2016-08-31 NOTE — Progress Notes (Signed)
Patient not sleeping well.  Dr. Holley Raring took patient off of Lotensin.BP today 136/97 Hr 101.  Patient currently on 2L O2. Patient complains of headache for past week.  Also states he is having bouts of vomiting. States he had a bad episode on Saturday and then again yesterday.

## 2016-09-01 ENCOUNTER — Other Ambulatory Visit: Payer: Self-pay | Admitting: *Deleted

## 2016-09-01 ENCOUNTER — Ambulatory Visit
Admission: RE | Admit: 2016-09-01 | Discharge: 2016-09-01 | Disposition: A | Discharge status: Home / Self Care | Payer: PRIVATE HEALTH INSURANCE | Source: Ambulatory Visit | Attending: Radiation Oncology | Admitting: Radiation Oncology

## 2016-09-01 ENCOUNTER — Encounter: Payer: Self-pay | Admitting: Radiation Oncology

## 2016-09-01 VITALS — BP 157/101 | HR 101 | Temp 96.8°F | Resp 18 | Ht 66.0 in | Wt 216.5 lb

## 2016-09-01 DIAGNOSIS — Z9221 Personal history of antineoplastic chemotherapy: Secondary | ICD-10-CM | POA: Diagnosis not present

## 2016-09-01 DIAGNOSIS — R51 Headache: Secondary | ICD-10-CM | POA: Diagnosis not present

## 2016-09-01 DIAGNOSIS — C7951 Secondary malignant neoplasm of bone: Secondary | ICD-10-CM | POA: Diagnosis not present

## 2016-09-01 DIAGNOSIS — Z510 Encounter for antineoplastic radiation therapy: Secondary | ICD-10-CM | POA: Diagnosis not present

## 2016-09-01 DIAGNOSIS — Z923 Personal history of irradiation: Secondary | ICD-10-CM | POA: Insufficient documentation

## 2016-09-01 DIAGNOSIS — C3411 Malignant neoplasm of upper lobe, right bronchus or lung: Secondary | ICD-10-CM | POA: Diagnosis not present

## 2016-09-01 DIAGNOSIS — M549 Dorsalgia, unspecified: Secondary | ICD-10-CM | POA: Insufficient documentation

## 2016-09-01 DIAGNOSIS — C7931 Secondary malignant neoplasm of brain: Secondary | ICD-10-CM | POA: Insufficient documentation

## 2016-09-01 MED ORDER — DEXAMETHASONE 4 MG PO TABS: 4.0000 mg | ORAL_TABLET | Freq: Every day | ORAL | 0 refills | Status: DC

## 2016-09-01 NOTE — Progress Notes (Signed)
Radiation Oncology Follow up Note old patient new area of brain and spine metastasis  Name: Stuart Spence   Date:   09/01/2016 MRN:  [id number] DOB: [birth year]    This 56 y.o. 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  REFERRING PROVIDER: Guadalupe Maple, MD  HPI: Patient is a 56 year old male previous a treated 1 year prior our department to his left humeral head for stage IV adenocarcinoma of the lung.. From a palliative standpoint he is done well in this area. He has been treated with chemotherapy Taxotere. He has presented with increasing lower back pain as well as some focal headaches. MRI scan of his brain showed scattered signal abnormality highly suspicious for metastatic disease. He was not able to be given contrast postcontrast brain MRI was recommended hearing he also had a 3 cm osseous metastasis of his skull and MRI scan of his lumbar spine as well as recent PET CT scan showed multiple focal osseous metastasis within the lumbar spine and sacrum and pelvis. Patient is able to handle it well he's currently on nasal oxygen. He is referred today for palliative radiation therapy consultation.  COMPLICATIONS OF TREATMENT: none  FOLLOW UP COMPLIANCE: keeps appointments   PHYSICAL EXAM:  BP (!) 157/101   Pulse (!) 101   Temp (!) 96.8 F (36 C)   Resp 18   Ht 5\' 6"  (1.676 m)   Wt 216 lb 7.9 oz (98.2 kg)   BMI 34.94 kg/m  Well-developed male on nasal oxygen in NAD. Deep palpation of his lumbar spine doesn't elicit pain. Range of motion of his lower extremities does not elicit pain motor sensory and DTR levels are equal and symmetric in the upper lower extremities. Well-developed well-nourished patient in NAD. HEENT reveals PERLA, EOMI, discs not visualized.  Oral cavity is clear. No oral mucosal lesions are identified. Neck is clear without evidence of cervical or supraclavicular  adenopathy. Lungs are clear to A&P. Cardiac examination is essentially unremarkable with regular rate and rhythm without murmur rub or thrill. Abdomen is benign with no organomegaly or masses noted. Motor sensory and DTR levels are equal and symmetric in the upper and lower extremities. Cranial nerves II through XII are grossly intact. Proprioception is intact. No peripheral adenopathy or edema is identified. No motor or sensory levels are noted. Crude visual fields are within normal range.  RADIOLOGY RESULTS: Brain MRI as well as PET CT scan and lumbar spine MRI all reviewed and compatible with the above-stated findings  PLAN: Patient has significant progression of disease. I do not think we need to prove brain metastasis any further with low-dose contrast MRI scan as the picture is clearly indicative of brain metastasis. I've started the patient 4 mg of Decadron and we'll plan on delivering 3000 cGy in 12 fractions to his whole brain. Risks and benefits of treatment including possible cognitive decline loss of hair irritation of the skin fatigue alteration of blood counts all were described in detail. I've also like to go ahead with palliative radiation therapy to his lumbar spine and SI joints up to 3000 cGy in 15 fractions. I would use a hypofractionated course of treatment based on the large amount of bowel that would in our treatment field. I have personally ordered and scheduled CT simulation of his whole brain for tomorrow. Next week will start planning his lumbar spine and SI joints. Patient comprehend my treatment  plan well. Will discuss any further radiologic studies of the brain as needed. I am comfortable proceeding with whole brain radiation therapy based on the present MRI evidence.  I would like to take this opportunity to thank you for allowing me to participate in the care of your patient.Armstead Peaks., MD

## 2016-09-02 ENCOUNTER — Inpatient Hospital Stay: Payer: No Typology Code available for payment source | Admitting: Hematology and Oncology

## 2016-09-02 ENCOUNTER — Ambulatory Visit
Admission: RE | Admit: 2016-09-02 | Discharge: 2016-09-02 | Disposition: A | Discharge status: Home / Self Care | Payer: PRIVATE HEALTH INSURANCE | Source: Ambulatory Visit | Attending: Radiation Oncology | Admitting: Radiation Oncology

## 2016-09-02 DIAGNOSIS — Z510 Encounter for antineoplastic radiation therapy: Secondary | ICD-10-CM | POA: Diagnosis not present

## 2016-09-03 ENCOUNTER — Other Ambulatory Visit: Payer: Self-pay | Admitting: *Deleted

## 2016-09-03 DIAGNOSIS — C7931 Secondary malignant neoplasm of brain: Secondary | ICD-10-CM

## 2016-09-06 DIAGNOSIS — Z510 Encounter for antineoplastic radiation therapy: Secondary | ICD-10-CM | POA: Diagnosis not present

## 2016-09-08 ENCOUNTER — Ambulatory Visit
Admission: RE | Admit: 2016-09-08 | Discharge: 2016-09-08 | Disposition: A | Discharge status: Home / Self Care | Payer: PRIVATE HEALTH INSURANCE | Source: Ambulatory Visit | Attending: Radiation Oncology | Admitting: Radiation Oncology

## 2016-09-08 DIAGNOSIS — Z510 Encounter for antineoplastic radiation therapy: Secondary | ICD-10-CM | POA: Diagnosis not present

## 2016-09-09 ENCOUNTER — Ambulatory Visit
Admission: RE | Admit: 2016-09-09 | Discharge: 2016-09-09 | Disposition: A | Discharge status: Home / Self Care | Payer: PRIVATE HEALTH INSURANCE | Source: Ambulatory Visit | Attending: Radiation Oncology | Admitting: Radiation Oncology

## 2016-09-09 DIAGNOSIS — Z510 Encounter for antineoplastic radiation therapy: Secondary | ICD-10-CM | POA: Diagnosis not present

## 2016-09-10 ENCOUNTER — Ambulatory Visit: Payer: PRIVATE HEALTH INSURANCE

## 2016-09-10 ENCOUNTER — Ambulatory Visit
Admission: RE | Admit: 2016-09-10 | Discharge: 2016-09-10 | Disposition: A | Discharge status: Home / Self Care | Payer: PRIVATE HEALTH INSURANCE | Source: Ambulatory Visit | Attending: Radiation Oncology | Admitting: Radiation Oncology

## 2016-09-10 DIAGNOSIS — Z510 Encounter for antineoplastic radiation therapy: Secondary | ICD-10-CM | POA: Diagnosis not present

## 2016-09-13 ENCOUNTER — Ambulatory Visit: Payer: PRIVATE HEALTH INSURANCE

## 2016-09-13 ENCOUNTER — Ambulatory Visit
Admission: RE | Admit: 2016-09-13 | Discharge: 2016-09-13 | Disposition: A | Discharge status: Home / Self Care | Payer: PRIVATE HEALTH INSURANCE | Source: Ambulatory Visit | Attending: Radiation Oncology | Admitting: Radiation Oncology

## 2016-09-13 DIAGNOSIS — Z510 Encounter for antineoplastic radiation therapy: Secondary | ICD-10-CM | POA: Diagnosis not present

## 2016-09-14 ENCOUNTER — Ambulatory Visit: Payer: PRIVATE HEALTH INSURANCE

## 2016-09-14 ENCOUNTER — Encounter: Payer: Self-pay | Admitting: Emergency Medicine

## 2016-09-14 ENCOUNTER — Emergency Department: Payer: No Typology Code available for payment source

## 2016-09-14 ENCOUNTER — Other Ambulatory Visit: Payer: Self-pay

## 2016-09-14 ENCOUNTER — Inpatient Hospital Stay
Admission: EM | Admit: 2016-09-14 | Discharge: 2016-09-16 | DRG: 175 | Disposition: A | Discharge status: Hospice | Payer: No Typology Code available for payment source | Attending: Internal Medicine | Admitting: Internal Medicine

## 2016-09-14 DIAGNOSIS — C7931 Secondary malignant neoplasm of brain: Secondary | ICD-10-CM | POA: Diagnosis present

## 2016-09-14 DIAGNOSIS — Z79899 Other long term (current) drug therapy: Secondary | ICD-10-CM | POA: Diagnosis not present

## 2016-09-14 DIAGNOSIS — J9601 Acute respiratory failure with hypoxia: Secondary | ICD-10-CM | POA: Diagnosis present

## 2016-09-14 DIAGNOSIS — A419 Sepsis, unspecified organism: Secondary | ICD-10-CM | POA: Diagnosis present

## 2016-09-14 DIAGNOSIS — F329 Major depressive disorder, single episode, unspecified: Secondary | ICD-10-CM | POA: Diagnosis present

## 2016-09-14 DIAGNOSIS — C3411 Malignant neoplasm of upper lobe, right bronchus or lung: Secondary | ICD-10-CM | POA: Diagnosis present

## 2016-09-14 DIAGNOSIS — Y95 Nosocomial condition: Secondary | ICD-10-CM | POA: Diagnosis present

## 2016-09-14 DIAGNOSIS — Z923 Personal history of irradiation: Secondary | ICD-10-CM | POA: Diagnosis not present

## 2016-09-14 DIAGNOSIS — Z515 Encounter for palliative care: Secondary | ICD-10-CM

## 2016-09-14 DIAGNOSIS — Z9981 Dependence on supplemental oxygen: Secondary | ICD-10-CM | POA: Diagnosis not present

## 2016-09-14 DIAGNOSIS — Z66 Do not resuscitate: Secondary | ICD-10-CM

## 2016-09-14 DIAGNOSIS — Z801 Family history of malignant neoplasm of trachea, bronchus and lung: Secondary | ICD-10-CM | POA: Diagnosis not present

## 2016-09-14 DIAGNOSIS — Z7901 Long term (current) use of anticoagulants: Secondary | ICD-10-CM | POA: Diagnosis not present

## 2016-09-14 DIAGNOSIS — R5383 Other fatigue: Secondary | ICD-10-CM | POA: Diagnosis not present

## 2016-09-14 DIAGNOSIS — I10 Essential (primary) hypertension: Secondary | ICD-10-CM | POA: Diagnosis not present

## 2016-09-14 DIAGNOSIS — I2699 Other pulmonary embolism without acute cor pulmonale: Secondary | ICD-10-CM

## 2016-09-14 DIAGNOSIS — Z8249 Family history of ischemic heart disease and other diseases of the circulatory system: Secondary | ICD-10-CM

## 2016-09-14 DIAGNOSIS — I2692 Saddle embolus of pulmonary artery without acute cor pulmonale: Secondary | ICD-10-CM | POA: Diagnosis present

## 2016-09-14 DIAGNOSIS — C7951 Secondary malignant neoplasm of bone: Secondary | ICD-10-CM

## 2016-09-14 DIAGNOSIS — G893 Neoplasm related pain (acute) (chronic): Secondary | ICD-10-CM

## 2016-09-14 DIAGNOSIS — Z833 Family history of diabetes mellitus: Secondary | ICD-10-CM | POA: Diagnosis not present

## 2016-09-14 DIAGNOSIS — C3490 Malignant neoplasm of unspecified part of unspecified bronchus or lung: Secondary | ICD-10-CM

## 2016-09-14 DIAGNOSIS — Z7952 Long term (current) use of systemic steroids: Secondary | ICD-10-CM | POA: Diagnosis not present

## 2016-09-14 DIAGNOSIS — J90 Pleural effusion, not elsewhere classified: Secondary | ICD-10-CM

## 2016-09-14 DIAGNOSIS — I248 Other forms of acute ischemic heart disease: Secondary | ICD-10-CM | POA: Diagnosis present

## 2016-09-14 DIAGNOSIS — J189 Pneumonia, unspecified organism: Secondary | ICD-10-CM | POA: Diagnosis present

## 2016-09-14 DIAGNOSIS — Z9221 Personal history of antineoplastic chemotherapy: Secondary | ICD-10-CM

## 2016-09-14 DIAGNOSIS — R0602 Shortness of breath: Secondary | ICD-10-CM | POA: Diagnosis present

## 2016-09-14 DIAGNOSIS — I2609 Other pulmonary embolism with acute cor pulmonale: Secondary | ICD-10-CM

## 2016-09-14 LAB — CBC WITH DIFFERENTIAL/PLATELET
BASOS ABS: 0.1 10*3/uL (ref 0–0.1)
BASOS PCT: 0 %
Eosinophils Absolute: 0 10*3/uL (ref 0–0.7)
Eosinophils Relative: 0 %
HEMATOCRIT: 42.1 % (ref 40.0–52.0)
HEMOGLOBIN: 13.3 g/dL (ref 13.0–18.0)
LYMPHS PCT: 3 %
Lymphs Abs: 0.9 10*3/uL — ABNORMAL LOW (ref 1.0–3.6)
MCH: 29.4 pg (ref 26.0–34.0)
MCHC: 31.6 g/dL — AB (ref 32.0–36.0)
MCV: 93.1 fL (ref 80.0–100.0)
MONOS PCT: 5 %
Monocytes Absolute: 1.4 10*3/uL — ABNORMAL HIGH (ref 0.2–1.0)
NEUTROS ABS: 25.8 10*3/uL — AB (ref 1.4–6.5)
NEUTROS PCT: 92 %
Platelets: 105 10*3/uL — ABNORMAL LOW (ref 150–440)
RBC: 4.52 MIL/uL (ref 4.40–5.90)
RDW: 19.5 % — ABNORMAL HIGH (ref 11.5–14.5)
WBC: 28.2 10*3/uL — ABNORMAL HIGH (ref 3.8–10.6)

## 2016-09-14 LAB — COMPREHENSIVE METABOLIC PANEL
ALK PHOS: 235 U/L — AB (ref 38–126)
ALT: 48 U/L (ref 17–63)
ANION GAP: 11 (ref 5–15)
AST: 36 U/L (ref 15–41)
Albumin: 3.3 g/dL — ABNORMAL LOW (ref 3.5–5.0)
BUN: 43 mg/dL — ABNORMAL HIGH (ref 6–20)
CALCIUM: 9 mg/dL (ref 8.9–10.3)
CHLORIDE: 98 mmol/L — AB (ref 101–111)
CO2: 26 mmol/L (ref 22–32)
Creatinine, Ser: 1.69 mg/dL — ABNORMAL HIGH (ref 0.61–1.24)
GFR calc non Af Amer: 44 mL/min — ABNORMAL LOW (ref >60)
GFR, EST AFRICAN AMERICAN: 51 mL/min — AB (ref >60)
GLUCOSE: 116 mg/dL — AB (ref 65–99)
POTASSIUM: 5 mmol/L (ref 3.5–5.1)
SODIUM: 135 mmol/L (ref 135–145)
Total Bilirubin: 0.6 mg/dL (ref 0.3–1.2)
Total Protein: 7.6 g/dL (ref 6.5–8.1)

## 2016-09-14 LAB — PROTIME-INR
INR: 1.19
Prothrombin Time: 15.2 seconds (ref 11.4–15.2)

## 2016-09-14 LAB — MRSA PCR SCREENING: MRSA BY PCR: NEGATIVE

## 2016-09-14 LAB — LACTIC ACID, PLASMA
LACTIC ACID, VENOUS: 2.2 mmol/L — AB (ref 0.5–1.9)
LACTIC ACID, VENOUS: 1.6 mmol/L (ref 0.5–1.9)

## 2016-09-14 LAB — TROPONIN I
TROPONIN I: 0.27 ng/mL — AB (ref <0.03)
TROPONIN I: 0.16 ng/mL — AB (ref <0.03)
Troponin I: 0.27 ng/mL (ref <0.03)

## 2016-09-14 LAB — PROCALCITONIN: Procalcitonin: 0.16 ng/mL

## 2016-09-14 LAB — FIBRIN DERIVATIVES D-DIMER (ARMC ONLY): Fibrin derivatives D-dimer (ARMC): >7500 — ABNORMAL HIGH (ref 0.00–499.00)

## 2016-09-14 LAB — APTT: aPTT: 25 seconds (ref 24–36)

## 2016-09-14 LAB — HEPARIN LEVEL (UNFRACTIONATED): HEPARIN UNFRACTIONATED: 0.75 [IU]/mL — AB (ref 0.30–0.70)

## 2016-09-14 MED ORDER — HEPARIN BOLUS VIA INFUSION
5000.0000 [IU] | Freq: Once | INTRAVENOUS | Status: AC
  Administered 2016-09-14: 5000 [IU] via INTRAVENOUS
  Filled 2016-09-14: qty 5000

## 2016-09-14 MED ORDER — BLISTEX MEDICATED EX OINT
TOPICAL_OINTMENT | CUTANEOUS | Status: DC | PRN
  Filled 2016-09-14: qty 6.3

## 2016-09-14 MED ORDER — VANCOMYCIN HCL 10 G IV SOLR
1250.0000 mg | INTRAVENOUS | Status: DC
  Filled 2016-09-14 (×2): qty 1250

## 2016-09-14 MED ORDER — METHYLPREDNISOLONE SODIUM SUCC 40 MG IJ SOLR
40.0000 mg | Freq: Two times a day (BID) | INTRAMUSCULAR | Status: DC
  Administered 2016-09-14 – 2016-09-15 (×2): 40 mg via INTRAVENOUS
  Filled 2016-09-14 (×2): qty 1

## 2016-09-14 MED ORDER — MORPHINE SULFATE 10 MG/5ML PO SOLN
1.0000 mg | ORAL | Status: DC | PRN
  Administered 2016-09-14 – 2016-09-15 (×2): 2 mg via ORAL
  Filled 2016-09-14 (×2): qty 5

## 2016-09-14 MED ORDER — IOPAMIDOL (ISOVUE-370) INJECTION 76%
60.0000 mL | Freq: Once | INTRAVENOUS | Status: AC | PRN
  Administered 2016-09-14: 60 mL via INTRAVENOUS

## 2016-09-14 MED ORDER — BUDESONIDE 0.5 MG/2ML IN SUSP
0.5000 mg | Freq: Two times a day (BID) | RESPIRATORY_TRACT | Status: DC
  Administered 2016-09-14 – 2016-09-16 (×4): 0.5 mg via RESPIRATORY_TRACT
  Filled 2016-09-14 (×4): qty 2

## 2016-09-14 MED ORDER — CEFEPIME-DEXTROSE 1 GM/50ML IV SOLR
1.0000 g | Freq: Two times a day (BID) | INTRAVENOUS | Status: DC
  Filled 2016-09-14: qty 50

## 2016-09-14 MED ORDER — CEFEPIME HCL 1 G IJ SOLR
1.0000 g | Freq: Two times a day (BID) | INTRAMUSCULAR | Status: DC
  Administered 2016-09-14: 1 g via INTRAVENOUS
  Filled 2016-09-14 (×3): qty 1

## 2016-09-14 MED ORDER — PIPERACILLIN-TAZOBACTAM 3.375 G IVPB 30 MIN
3.3750 g | Freq: Once | INTRAVENOUS | Status: AC
  Administered 2016-09-14: 3.375 g via INTRAVENOUS
  Filled 2016-09-14: qty 50

## 2016-09-14 MED ORDER — VANCOMYCIN HCL IN DEXTROSE 1-5 GM/200ML-% IV SOLN
1000.0000 mg | Freq: Once | INTRAVENOUS | Status: AC
  Administered 2016-09-14: 1000 mg via INTRAVENOUS
  Filled 2016-09-14: qty 200

## 2016-09-14 MED ORDER — HEPARIN (PORCINE) IN NACL 100-0.45 UNIT/ML-% IJ SOLN
1350.0000 [IU]/h | INTRAMUSCULAR | Status: DC
  Administered 2016-09-14: 1450 [IU]/h via INTRAVENOUS
  Administered 2016-09-15: 1350 [IU]/h via INTRAVENOUS
  Filled 2016-09-14 (×2): qty 250

## 2016-09-14 MED ORDER — IPRATROPIUM-ALBUTEROL 0.5-2.5 (3) MG/3ML IN SOLN
3.0000 mL | Freq: Four times a day (QID) | RESPIRATORY_TRACT | Status: DC
  Administered 2016-09-14: 3 mL via RESPIRATORY_TRACT
  Filled 2016-09-14: qty 3

## 2016-09-14 MED ORDER — IPRATROPIUM-ALBUTEROL 0.5-2.5 (3) MG/3ML IN SOLN
3.0000 mL | RESPIRATORY_TRACT | Status: DC
  Administered 2016-09-14 – 2016-09-15 (×3): 3 mL via RESPIRATORY_TRACT
  Filled 2016-09-14 (×4): qty 3

## 2016-09-14 MED ORDER — FLUOXETINE HCL 20 MG PO CAPS
20.0000 mg | ORAL_CAPSULE | Freq: Every day | ORAL | Status: DC
  Administered 2016-09-15 – 2016-09-16 (×2): 20 mg via ORAL
  Filled 2016-09-14 (×2): qty 1

## 2016-09-14 MED ORDER — HYDRALAZINE HCL 20 MG/ML IJ SOLN: 10.0000 mg | INTRAMUSCULAR | Status: DC | PRN

## 2016-09-14 MED ORDER — ALBUTEROL SULFATE (2.5 MG/3ML) 0.083% IN NEBU: 2.5000 mg | INHALATION_SOLUTION | RESPIRATORY_TRACT | Status: DC | PRN

## 2016-09-14 NOTE — Progress Notes (Signed)
ANTICOAGULATION CONSULT NOTE - Initial Consult  Pharmacy Consult for Heparin Drip Indication: pulmonary embolus  No Known Allergies  Patient Measurements: Weight: 216 lb (98 kg) Heparin Dosing Weight: 85.23 kg  Vital Signs: BP: 141/94 (06/12 1130) Pulse Rate: 86 (06/12 1130)  Labs:  Recent Labs  09/14/16 0935  HGB 13.3  HCT 42.1  PLT 105*  CREATININE 1.69*  TROPONINI 0.27*    Estimated Creatinine Clearance: 54.1 mL/min (A) (by C-G formula based on SCr of 1.69 mg/dL (H)).   Medical History: Past Medical History:  Diagnosis Date  . Cancer ([place])    Lung  . Depression   . ED (erectile dysfunction)   . Gout   . Hypertension   . On home oxygen therapy   . Personal history of chemotherapy    PT TAKING CHEMO EVERY 3 WEEKS    Medications:  Scheduled:   Infusions:  . heparin 1,450 Units/hr (09/14/16 1221)    Assessment: Pharmacy consulted to dose and monitor heparin for pulmonary embolism in this 56 yo male with h/o metastatic lung cancer.   Goal of Therapy:  Heparin level 0.3-0.7 units/ml Monitor platelets by anticoagulation protocol: Yes   Plan:  Give 5000 units bolus x 1 Start heparin infusion at 1450 units/hr Check anti-Xa level in 6 hours and daily while on heparin Continue to monitor H&H and platelets   HL ordered for 6/12 at 18:30.   Curt Jews, [place] Pharmacist 09/14/2016,12:29 PM

## 2016-09-14 NOTE — ED Notes (Signed)
Pt back from CT

## 2016-09-14 NOTE — Consult Note (Signed)
Consultation Note Date: 09/14/2016   Patient Name: Stuart Spence  DOB: 1960-12-07  MRN: [id number]  Age / Sex: 56 y.o., male  PCP: Guadalupe Maple, MD Referring Physician: Nicholes Mango, MD  Reason for Consultation: Establishing goals of care and Psychosocial/spiritual support  HPI/Patient Profile: 56 y.o. male   admitted on 09/14/2016 with known history of Metastatic lung cancer with brain metastases currently undergoing radiation therapy is presenting to the ED with a chief complaint of shortness of breath   His course is now complicated by acute PE and HCAP, patient is currently on a nonrebreather.   Patient and family have questions for oncology as far as further treatment options, clinical trials and prognosis.  All understand that this cancer is non-curable and poor prognosis.  Patient  faces advanced directive decisions and anticipatory care needs   Clinical Assessment and Goals of Care:  This NP Wadie Lessen reviewed medical records, received report from team, assessed the patient and then meet at the patient's bedside along with his wife and daughter  to discuss diagnosis, prognosis, GOC, EOL wishes disposition and options.  A detailed discussion was had today regarding advanced directives.  Concepts specific to code status, artifical feeding and hydration, continued IV antibiotics and rehospitalization was had.  The difference between a aggressive medical intervention path  and a palliative comfort care path for this patient at this time was had.  Values and goals of care important to patient and family were attempted to be elicited.  Emotional support specifically to wife and daughter, we discussed counseling options thru area [place] were discussed    Questions and concerns addressed.   Family encouraged to call with questions or concerns.  PMT will  continue to support holistically, I plan to meet with family again in the morning at 10:00   [place]:  DNR  Palliative Prophylaxis:   Aspiration, Bowel Regimen, Frequent Pain Assessment and Oral Care  Additional Recommendations (Limitations, Scope, Preferences):  Full Scope Treatment  Psycho-social/Spiritual:   Desire for further Chaplaincy support:yes  Additional Recommendations: Education on Hospice   Discharge Planning: To Be Determined      Primary Diagnoses: Present on Admission: . Acute respiratory failure with hypoxia ([place])   I have reviewed the medical record, interviewed the patient and family, and examined the patient. The following aspects are pertinent.  Past Medical History:  Diagnosis Date  . Cancer ([place])    Lung  . Depression   . ED (erectile dysfunction)   . Gout   . Hypertension   . On home oxygen therapy   . Personal history of chemotherapy    PT TAKING CHEMO EVERY 3 WEEKS   Social History   Social History  . Marital status: Married    Spouse name: N/A  . Number of children: N/A  . Years of education: N/A   Social History Main Topics  . Smoking status: Never Smoker  . Smokeless tobacco: Never  Used  . Alcohol use No     Comment: gave it up in August 2016  . Drug use: No  . Sexual activity: Not Asked   Other Topics Concern  . None   Social History Narrative  . None   Family History  Problem Relation Age of Onset  . Cancer Mother 7       lung  . Heart attack Father 11  . Hypertension Father   . Congestive Heart Failure Father 36       died from  . Diabetes Sister    Scheduled Meds: . ipratropium-albuterol  3 mL Nebulization Q6H   Continuous Infusions: . ceFEPime (MAXIPIME) IV    . heparin 1,450 Units/hr (09/14/16 1221)  . vancomycin     PRN Meds:.albuterol, lip balm Medications Prior to Admission:  Prior to Admission medications   Medication Sig Start Date  End Date Taking? Authorizing Provider  albuterol (PROVENTIL HFA;VENTOLIN HFA) 108 (90 Base) MCG/ACT inhaler Inhale 1-2 puffs into the lungs every 6 (six) hours as needed for wheezing or shortness of breath. Reported on 09/12/2015   Yes [provider]  amLODipine (NORVASC) 5 MG tablet Take 1 tablet (5 mg total) by mouth daily. 06/22/16  Yes Crissman, Jeannette How, MD  benazepril (LOTENSIN) 40 MG tablet Take 40 mg by mouth daily.   Yes [provider]  Calcium Carbonate (CALCIUM 600 PO) Take 1,200 mg by mouth 2 (two) times daily.   Yes [provider]  colchicine 0.6 MG tablet Take 0.6 mg by mouth daily as needed (two pills by mouth at onset, the one pill one hour later, maximum three pills per gout flare).   Yes [provider]  dexamethasone (DECADRON) 4 MG tablet Take 1 tablet (4 mg total) by mouth daily. 09/01/16  Yes Chrystal, Eulas Post, MD  FLUoxetine (PROZAC) 20 MG capsule Take 2 capsules (40 mg total) by mouth every morning. 06/22/16  Yes Crissman, Jeannette How, MD  lidocaine (XYLOCAINE) 2 % solution Use as directed 15 mLs in the mouth or throat every 4 (four) hours as needed for mouth pain. 07/26/16  Yes Sainani, Belia Heman, MD  LORazepam (ATIVAN) 0.5 MG tablet Take 1 tablet (0.5 mg total) by mouth every 6 (six) hours as needed. For nausea 07/29/16  Yes Corcoran, Drue Second, MD  nystatin (NYSTATIN) powder Apply topically 3 (three) times daily as needed. Patient taking differently: Apply topically 3 (three) times daily as needed. For skin irritation 05/28/16  Yes Corcoran, Drue Second, MD  ondansetron (ZOFRAN) 8 MG tablet Take 1 tablet (8 mg total) by mouth every 8 (eight) hours as needed for nausea or vomiting. 08/02/16  Yes Corcoran, Melissa C, MD  oxyCODONE (ROXICODONE) 5 MG immediate release tablet Take 1 tablet (5 mg total) by mouth every 8 (eight) hours as needed. Patient taking differently: Take 5 mg by mouth every 8 (eight) hours as needed for moderate pain or severe pain.  08/31/16  08/31/17 Yes Corcoran, Drue Second, MD  pantoprazole (PROTONIX) 40 MG tablet Take 1 tablet (40 mg total) by mouth daily. 06/25/16  Yes Henreitta Leber, MD  sildenafil (VIAGRA) 100 MG tablet Take 1 tablet (100 mg total) by mouth daily as needed for erectile dysfunction. 12/24/15  Yes Crissman, Jeannette How, MD  dexamethasone (DECADRON) 4 MG tablet Take 2 tablets (8 mg total) by mouth 2 (two) times daily. Start the day before Taxotere. Then daily after chemo for 2 days. 07/18/16   Lequita Asal, MD  lidocaine-prilocaine (  EMLA) cream Apply 1 application topically as needed. 08/21/15   Lequita Asal, MD   No Known Allergies Review of Systems  Respiratory: Positive for shortness of breath.   Neurological: Positive for weakness.    Physical Exam  Constitutional: He is oriented to person, place, and time. He appears well-developed. He is cooperative. He appears ill.  - on BiPap  Cardiovascular: Normal rate, regular rhythm and normal heart sounds.   Pulmonary/Chest: He has decreased breath sounds in the right lower field.  Neurological: He is alert and oriented to person, place, and time.  Skin: Skin is warm and dry.    Vital Signs: BP 128/90   Pulse 90   Resp 13   Wt 98 kg (216 lb)   SpO2 92%   BMI 34.86 kg/m  Pain Assessment: No/denies pain   Pain Score: 0-No pain   SpO2: SpO2: 92 % O2 Device:SpO2: 92 % O2 Flow Rate: .O2 Flow Rate (L/min): 15 L/min  IO: Intake/output summary:  Intake/Output Summary (Last 24 hours) at 09/14/16 1457 Last data filed at 09/14/16 1313  Gross per 24 hour  Intake           212.57 ml  Output                0 ml  Net           212.57 ml    LBM:   Baseline Weight: Weight: 98 kg (216 lb) Most recent weight: Weight: 98 kg (216 lb)      Palliative Assessment/Data: Currently 30 %   Discussedw ith Dr Margaretmary Eddy  Time In: 1345 Time Out: 1500 Time Total: 75 min Greater than 50%  of this time was spent counseling and coordinating care related to the  above assessment and plan.  Signed by: Wadie Lessen, NP   Please contact Palliative Medicine Team phone at [phone] for questions and concerns.  For individual provider: See Shea Evans

## 2016-09-14 NOTE — H&P (Signed)
[facility] NAME: Stuart Spence    MR#:  [id number]  DATE OF BIRTH:  Mar 19, 1961  DATE OF ADMISSION:  09/14/2016  PRIMARY CARE PHYSICIAN: Guadalupe Maple, MD   REQUESTING/REFERRING PHYSICIAN:kinner  CHIEF COMPLAINT:  Shortness of breath  HISTORY OF PRESENT ILLNESS:  Stuart Spence  is a 56 y.o. male with a known history of Metastatic lung cancer with brain metastases currently undergoing radiation therapy is presenting to the ED with a chief complaint of shortness of breath Patient had a radiation treatment yesterday and was scheduled for an for next treatment today but patient woke up with sob today. As per EMS patient was at 70% on room air. CT angiogram has revealed pulmonary embolism new onset and possible pneumonia  PAST MEDICAL HISTORY:   Past Medical History:  Diagnosis Date  . Cancer ([place])    Lung  . Depression   . ED (erectile dysfunction)   . Gout   . Hypertension   . On home oxygen therapy   . Personal history of chemotherapy    PT TAKING CHEMO EVERY 3 WEEKS    PAST SURGICAL HISTOIRY:   Past Surgical History:  Procedure Laterality Date  . CHEST TUBE INSERTION Left 08/13/2015   Procedure: CHEST TUBE INSERTION;  Surgeon: Nestor Lewandowsky, MD;  Location: [facility] ORS;  Service: General;  Laterality: Left;  . CHEST TUBE INSERTION N/A 09/18/2015   Procedure: PLEURX CATH REMOVAL;  Surgeon: Nestor Lewandowsky, MD;  Location: [facility] ORS;  Service: Thoracic;  Laterality: N/A;  . PORTACATH PLACEMENT Left 08/13/2015   Procedure: INSERTION PORT-A-CATH;  Surgeon: Nestor Lewandowsky, MD;  Location: [facility] ORS;  Service: General;  Laterality: Left;  . PORTACATH PLACEMENT    . TONSILLECTOMY      SOCIAL HISTORY:   Social History  Substance Use Topics  . Smoking status: Never Smoker  . Smokeless tobacco: Never Used  . Alcohol use No     Comment: gave it up in August 2016    FAMILY HISTORY:   Family History  Problem Relation Age of  Onset  . Cancer Mother 15       lung  . Heart attack Father 86  . Hypertension Father   . Congestive Heart Failure Father 34       died from  . Diabetes Sister     DRUG ALLERGIES:  No Known Allergies  REVIEW OF SYSTEMS:  CONSTITUTIONAL: No fever, reports fatigue or weakness.  EYES: No blurred or double vision.  EARS, NOSE, AND THROAT: No tinnitus or ear pain.  RESPIRATORY: Reports shortness of breath, no wheezing or hemoptysis.  CARDIOVASCULAR: No chest pain, orthopnea, edema.  GASTROINTESTINAL: No nausea, vomiting, diarrhea or abdominal pain.  GENITOURINARY: No dysuria, hematuria.  ENDOCRINE: No polyuria, nocturia,  HEMATOLOGY: No anemia, easy bruising or bleeding SKIN: No rash or lesion. MUSCULOSKELETAL: No joint pain or arthritis.   NEUROLOGIC: No tingling, numbness, weakness.  PSYCHIATRY: No anxiety or depression.   MEDICATIONS AT HOME:   Prior to Admission medications   Medication Sig Start Date End Date Taking? Authorizing Provider  albuterol (PROVENTIL HFA;VENTOLIN HFA) 108 (90 Base) MCG/ACT inhaler Inhale 1-2 puffs into the lungs every 6 (six) hours as needed for wheezing or shortness of breath. Reported on 09/12/2015   Yes [provider]  amLODipine (NORVASC) 5 MG tablet Take 1 tablet (5 mg total) by mouth daily. 06/22/16  Yes Crissman, Jeannette How, MD  benazepril (LOTENSIN) 40 MG tablet Take 40  mg by mouth daily.   Yes [provider]  Calcium Carbonate (CALCIUM 600 PO) Take 1,200 mg by mouth 2 (two) times daily.   Yes [provider]  colchicine 0.6 MG tablet Take 0.6 mg by mouth daily as needed (two pills by mouth at onset, the one pill one hour later, maximum three pills per gout flare).   Yes [provider]  dexamethasone (DECADRON) 4 MG tablet Take 1 tablet (4 mg total) by mouth daily. 09/01/16  Yes Chrystal, Eulas Post, MD  FLUoxetine (PROZAC) 20 MG capsule Take 2 capsules (40 mg total) by mouth every morning. 06/22/16  Yes Crissman, Jeannette How, MD  lidocaine (XYLOCAINE) 2 % solution Use as directed 15 mLs in the mouth or throat every 4 (four) hours as needed for mouth pain. 07/26/16  Yes Sainani, Belia Heman, MD  LORazepam (ATIVAN) 0.5 MG tablet Take 1 tablet (0.5 mg total) by mouth every 6 (six) hours as needed. For nausea 07/29/16  Yes Corcoran, Drue Second, MD  nystatin (NYSTATIN) powder Apply topically 3 (three) times daily as needed. Patient taking differently: Apply topically 3 (three) times daily as needed. For skin irritation 05/28/16  Yes Corcoran, Drue Second, MD  ondansetron (ZOFRAN) 8 MG tablet Take 1 tablet (8 mg total) by mouth every 8 (eight) hours as needed for nausea or vomiting. 08/02/16  Yes Corcoran, Melissa C, MD  oxyCODONE (ROXICODONE) 5 MG immediate release tablet Take 1 tablet (5 mg total) by mouth every 8 (eight) hours as needed. Patient taking differently: Take 5 mg by mouth every 8 (eight) hours as needed for moderate pain or severe pain.  08/31/16 08/31/17 Yes Corcoran, Drue Second, MD  pantoprazole (PROTONIX) 40 MG tablet Take 1 tablet (40 mg total) by mouth daily. 06/25/16  Yes Henreitta Leber, MD  sildenafil (VIAGRA) 100 MG tablet Take 1 tablet (100 mg total) by mouth daily as needed for erectile dysfunction. 12/24/15  Yes Crissman, Jeannette How, MD  dexamethasone (DECADRON) 4 MG tablet Take 2 tablets (8 mg total) by mouth 2 (two) times daily. Start the day before Taxotere. Then daily after chemo for 2 days. 07/18/16   Lequita Asal, MD  lidocaine-prilocaine (EMLA) cream Apply 1 application topically as needed. 08/21/15   Lequita Asal, MD      VITAL SIGNS:  Blood pressure (!) 141/94, pulse 86, resp. rate 15, weight 98 kg (216 lb), SpO2 96 %.  PHYSICAL EXAMINATION:  GENERAL:  56 y.o.-year-old patient lying in the bed with no acute distress.  EYES: Pupils equal, round, reactive to light and accommodation. No scleral icterus. Extraocular muscles intact.  HEENT: Head atraumatic, normocephalic. Oropharynx and  nasopharynx clear.  NECK:  Supple, no jugular venous distention. No thyroid enlargement, no tenderness.  LUNGS: Mod diminished breath sounds bilaterally, no wheezing, rales,rhonchi or crepitation. No use of accessory muscles of respiration. On Bipap CARDIOVASCULAR: S1, S2 normal. No murmurs, rubs, or gallops.  ABDOMEN: Soft, nontender, nondistended. Bowel sounds present. No organomegaly or mass.  EXTREMITIES: No pedal edema, cyanosis, or clubbing.  NEUROLOGIC: Cranial nerves are intact.  Sensation intact. Gait not checked.  PSYCHIATRIC: The patient is alert and oriented x 3.  SKIN: No obvious rash, lesion, or ulcer.   LABORATORY PANEL:   CBC  Recent Labs Lab 09/14/16 0935  WBC 28.2*  HGB 13.3  HCT 42.1  PLT 105*   ------------------------------------------------------------------------------------------------------------------  Chemistries   Recent Labs Lab 09/14/16 0935  NA 135  K 5.0  CL 98*  CO2  26  GLUCOSE 116*  BUN 43*  CREATININE 1.69*  CALCIUM 9.0  AST 36  ALT 48  ALKPHOS 235*  BILITOT 0.6   ------------------------------------------------------------------------------------------------------------------  Cardiac Enzymes  Recent Labs Lab 09/14/16 0935  TROPONINI 0.27*   ------------------------------------------------------------------------------------------------------------------  RADIOLOGY:  Ct Angio Chest Pe W Or Wo Contrast  Result Date: 09/14/2016 CLINICAL DATA:  Pt to ed via acems from home with reports of shortness of breath this am. Pt was to have his third radiation tx today for lung cancer with mets to the brain.PT stated he use to take chemo for treatment EXAM: CT ANGIOGRAPHY CHEST WITH CONTRAST TECHNIQUE: Multidetector CT imaging of the chest was performed using the standard protocol during bolus administration of intravenous contrast. Multiplanar CT image reconstructions and MIPs were obtained to evaluate the vascular anatomy. CONTRAST:   60 mL of Isovue 370 intravenous contrast COMPARISON:  Current chest radiograph.  PET-CT, 07/08/2016. FINDINGS: Cardiovascular: There is satisfactory opacification of the pulmonary arteries. All There is a saddle embolus in the distal left pulmonary artery with acute pulmonary emboli extending into the segmental branches to the left upper lobe, including the lingula, and left lower lobe. There are no convincing right-sided pulmonary emboli. Right upper lobe pulmonary artery is are narrowed by surrounding soft tissue. RV/LV ratio is 1.0. The great vessels are normal in caliber. No significant thoracic aortic atherosclerosis. Mediastinum/Nodes: No mediastinal masses. No left hilar mass or adenopathy. Mildly enlarged right subcarinal lymph node measuring 16 mm in short axis. There is abnormal soft tissue surrounding the right hilar structures most evident superiorly. This is contiguous with a poorly defined medial right upper lobe mass, which has increased in size when compared to the prior PET-CT. Lungs/Pleura: Medial, pleural based, right upper lobe irregular mass currently measures approximately 4.1 x 3.6 cm transversely, previously approximately 3.0 x 2.5 cm. There is irregular nodular interstitial thickening throughout the aerated right lung with multiple patchy areas of nodular and more ill-defined confluent opacity. There is near complete opacification of the right lower lobe and a significant portion of the right middle lobe, likely postobstructive change and/or atelectasis. There is still a minimal right pleural effusion. In the left lung, there multiple areas of ground-glass and confluent type focal opacity. The largest and most discrete of these is in the left upper lobe lingula with posteriorly measuring 2.6 x 1.7 cm. Although these findings may be metastatic disease, areas of atelectasis and/or pulmonary infarction from pulmonary emboli is suspected. No evidence of pulmonary edema. No left pleural effusion.  No pneumothorax. Upper Abdomen: No acute findings. No visualize liver or adrenal masses. Partly imaged probable gallstone. Musculoskeletal: Multiple sclerotic bone lesions without significant change from the prior PET-CT. Review of the MIP images confirms the above findings. IMPRESSION: 1. Significant acute pulmonary emboli noted involving the left upper and lower lobes, all segments. Positive for acute PE with CT evidence of right heart strain (RV/LV Ratio = 1.0) which is borderline for at least submassive (intermediate risk) PE. The presence of right heart strain has been associated with an increased risk of morbidity and mortality. Please activate Code PE by paging [phone]. 2. There are new areas of patchy and masslike ground-glass and more confluent left lung opacity suggesting pulmonary infarction. A component of metastatic disease or pneumonia is not excluded. 3. Worsened metastatic disease in the right lung. The mass extending along the medial right upper lobe from the right hilum is larger. There are multiple ill-defined additional areas of lung opacity on the right  there are likely metastatic lesions, and there is reticulonodular irregular interstitial thickening consistent with lymphangitic spread. Confluent opacity throughout most of the right lower lobe and right upper lobe is likely predominantly atelectasis, given the presence of volume loss on the right. This has also increased. Small right pleural effusion. Critical Value/emergent results were called by telephone at the time of interpretation on 09/14/2016 at 11:27 am to Dr. Lavonia Drafts , who verbally acknowledged these results. Electronically Signed   By: Lajean Manes M.D.   On: 09/14/2016 11:32   Dg Chest Port 1 View  Result Date: 09/14/2016 CLINICAL DATA:  shortness of breath this am. 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 EXAM: PORTABLE CHEST 1 VIEW COMPARISON:  PET-CT, 07/08/2016.  Chest  radiograph, 06/23/2016. FINDINGS: Since the prior exam, increased opacity has developed on the right. There is airspace type opacity in the right mid to upper lung, predominating centrally. There is more confluent opacity at the right lung base in part likely due to a moderate pleural effusion. Left lung is hyperexpanded with mild increased perihilar vascularity, but otherwise clear. Cardiac silhouette is normal in size. Right hilum and mediastinum are silhouetted by the right lung opacity. No left hilar mass or convincing adenopathy. Skeletal structures are grossly intact. Osteoblastic lesions noted on the prior PET-CT are not well-defined. IMPRESSION: 1. Worsened lung aeration on the right when compared to the prior exams. This is due to a combination of pleural fluid, moderate-sized, and increased from the prior study, as well as increased parenchymal opacity. The parenchymal opacity may be postobstructive consolidation and/or atelectasis, metastatic disease or pneumonia or a combination. Electronically Signed   By: Lajean Manes M.D.   On: 09/14/2016 10:03    EKG:   Orders placed or performed during the hospital encounter of 09/14/16  . ED EKG  . ED EKG    IMPRESSION AND PLAN:   Adonte Vanriper  is a 56 y.o. male with a known history of Metastatic lung cancer with brain metastases currently undergoing radiation therapy is presenting to the ED with a chief complaint of shortness of breath Patient had a radiation treatment yesterday and was scheduled for an for next treatment today but patient woke up with sob today. As per EMS patient was at 70% on room air.  #Acute hypoxic respiratory failure secondary to healthcare associated pneumonia and new pulmonary embolism Admit to stepdown unit On BiPAP IV antibiotics and heparin drip Consults pulmonology discussed with intensivist  #Sepsis secondary to healthcare associated pneumonia Patient meets septic criteria with leukocytosis, elevated lactic  acid Will get sputum culture and sensitivity Cefepime and vancomycin   #New-onset pulmonary embolism Heparin per pharmacy consult Get echocardiogram to check right heart strain  #Metastatic lung cancer stage IV with poor prognosis Currently on radiation therapy Consult Dr. Mike Gip patient's primary oncologist, she will come and discuss with the patient and family regarding the prognosis Palliative care consult placed    DVT prophylaxis on heparin drip  All the records are reviewed and case discussed with ED provider. Management plans discussed with the patient, family and they are in agreement.  CODE STATUS: DNR , Wife is the healthcare power of attorney  TOTAL Critical care TIME TAKING CARE OF THIS PATIENT: 29minutes.   Note: This dictation was prepared with Dragon dictation along with smaller phrase technology. Any transcriptional errors that result from this process are unintentional.  Nicholes Mango M.D on 09/14/2016 at 12:15 PM  Between 7am to 6pm - Pager - [phone]  After 6pm go to [email or web address] - password [email or web address] [facility]  Office  [phone]  CC: Primary care physician; Guadalupe Maple, MD

## 2016-09-14 NOTE — Progress Notes (Signed)
Pharmacy Antibiotic Note  Stuart Spence is a 56 y.o. male admitted on 09/14/2016 with  pneumonia, PE, and metastatic lung cancer stage IV.  Pharmacy has been consulted for vancomycin dosing. Patient also receiving cefepime.  Patient received vancomycin 1g IV x 1 dose in ED.  Plan: DW: 78kg  Ke: 0.049   T1/2: 14   Vd: 54.6  Will start the patient on vancomycin 1250mg  IV every 18 hours with 6 hour stack dosing. Calculated trough at Css is 18. Will monitor renal function daily and adjust dose as needed.   MRSA PCR ordered- if negative, recommend discontinuation of vancomycin.   Weight: 216 lb (98 kg)  No data recorded.   Recent Labs Lab 09/14/16 0935 09/14/16 1241  WBC 28.2*  --   CREATININE 1.69*  --   LATICACIDVEN 2.2* 1.6    Estimated Creatinine Clearance: 54.1 mL/min (A) (by C-G formula based on SCr of 1.69 mg/dL (H)).    No Known Allergies  Antimicrobials this admission: 6/12 vancomycin >>  6/12 cefepime >>   Dose adjustments this admission:  Microbiology results: 6/12 BCx: pending 6/12 Sputum: pending  6/12 MRSA PCR: pending  Thank you for allowing pharmacy to be a part of this patient's care.  Pernell Dupre, PharmD, BCPS Clinical Pharmacist 09/14/2016 1:37 PM

## 2016-09-14 NOTE — Progress Notes (Signed)
Chaplain had an order requisition to visit the patient for Prayer. I visited the patient and Once I entered he had his wife and daughter present. The patient stated that he wasn't feeling well And he wanted me to pray with his family. I prayed with the family.

## 2016-09-14 NOTE — ED Notes (Signed)
Date and time results received: 09/14/16 1020 (use smartphrase ".now" to insert current time)  Test: lactic Critical Value: 2.2  Name of Provider Notified: Dr. Corky Downs  Orders Received? Or Actions Taken?: Orders Received - See Orders for details

## 2016-09-14 NOTE — Progress Notes (Signed)
Discussed with Dr. Margaretmary Eddy; patient is a 56 year old male with end-stage, metastatic lung cancer. His course is now complicated by acute PE and HCAP, patient is currently on a nonrebreather. His CODE STATUS is DO NOT RESUSCITATE.  Family is considering moving to palliative/comfort measures, but would like to discuss this with their primary oncologist Dr. Mike Gip beforehand. The patient will be seen by palliative care, and oncology, if they desire continued treatment, the patient can be admitted to the intensive care unit, otherwise can be admitted to the general medical floor or transferred to hospice care.  Marda Stalker, MD.

## 2016-09-14 NOTE — Therapy (Signed)
Patient transported via BiPAP to CCU 4 without incident. Patient connected to wall O2 and dedicated red outlet. Tolerated well, report given to [employer] RRT

## 2016-09-14 NOTE — Consult Note (Signed)
[facility]  Date of admission:  09/14/2016  Inpatient day:  09/14/2016  Consulting physician: Dr. Nicholes Mango   Reason for Consultation:  Stage IV lung cancer prognosis.  Chief Complaint: Stuart Spence is a 56 y.o. male with stage IV adenocarcinoma of the lung who was admitted through the emergency room with shortness of breath secondary to left sided pulmonary embolism, progressive metastatic disease, and possible health care associated pneumonia.Marland Kitchen  HPI:  The patient was diagnosed with stage IV lung cancer on 08/18/2015.  He initially received Keytruda, carboplatin, and Alimta followed by single agent Keytruda.  With progressive disease, he was treated with carboplatin, Alimta, and Avastin.  Disease again progressed and he was switched to Taxotere on 07/19/2016.  Treatment was complicated with nausea, vomiting, dehydration, mucositis, and thrush.  He declined further chemotherapy.  He was seen at [facility] on 08/06/2016 regarding clinical trial enrollment.  Based on [facility] testing, he was not approved for a clinical trial.  Recommendation was for further Taxotere.  He declined treatment.  PET scan on 07/08/2016 revealed worsening metastatic disease with a significant increase in osseous metastatic tumor burden and innumerable new bony lesions, as well as new abnormal accentuated activity along the right pleural effusion and adjacent lung suspicious for pleural metastatic disease.   He was seen in follow-up in clinic.  He noted a 2 week history of headaches and low back pain.  Head MRI without contrast (secondary to renal insufficiency) on 08/31/2016 revealed scattered signal abnormality highly suspicious for metastatic disease as well as a 3 cm osseous left posterior skull metastatic lesion.  Lumbar spine MRI on 08/31/2016 revealed multifocal osseous metastases from lung carcinoma.  There was no fracture.  We discussed palliative care as well as Hospice.  He was seen by Dr  Baruch Gouty in radiation oncology on 09/01/2016.  He began Decadron.  Plan was for 3000 cGy in12 fractions to the whole brain and 3000 cGy in 15 fractions to his lumbar spine.  radiation began on 09/08/2016.  He awoke this morning and became short of breath as he was heading out for radiation treatment.  EMS was called.  Initial O2 sats were 70%.  He was started on a non-rebreather with O2 sats 91%.  Chest CT angiogram on 09/14/2016 revealed significant left sided pulmonary embolism with right heart strain (submassive) with new areas of patchy and mass like ground-glass and more confluent left lung opacity suggesting pulmonary infarct.  There was worsened metastatic disease on the right.  He was admitted to the ICU on BIPAP and started on heparin.  Lactic acid was 2.2. He was started on vancomycin, Cefepime and Zosyn.    Symptomatically, he notes general fatigue.  Prior to recent events, he spent most of his day in bed or on the couch resting.  He denies any pain.   Past Medical History:  Diagnosis Date  . Cancer ([place])    Lung  . Depression   . ED (erectile dysfunction)   . Gout   . Hypertension   . On home oxygen therapy   . Personal history of chemotherapy    PT TAKING CHEMO EVERY 3 WEEKS    Past Surgical History:  Procedure Laterality Date  . CHEST TUBE INSERTION Left 08/13/2015   Procedure: CHEST TUBE INSERTION;  Surgeon: Nestor Lewandowsky, MD;  Location: [facility] ORS;  Service: General;  Laterality: Left;  . CHEST TUBE INSERTION N/A 09/18/2015   Procedure: PLEURX CATH REMOVAL;  Surgeon: Nestor Lewandowsky, MD;  Location:  [facility] ORS;  Service: Thoracic;  Laterality: N/A;  . PORTACATH PLACEMENT Left 08/13/2015   Procedure: INSERTION PORT-A-CATH;  Surgeon: Nestor Lewandowsky, MD;  Location: [facility] ORS;  Service: General;  Laterality: Left;  . PORTACATH PLACEMENT    . TONSILLECTOMY      Family History  Problem Relation Age of Onset  . Cancer Mother 91       lung  . Heart attack Father 81  . Hypertension  Father   . Congestive Heart Failure Father 83       died from  . Diabetes Sister     Social History:  reports that he has never smoked. He has never used smokeless tobacco. He reports that he does not drink alcohol or use drugs.  He is accompanied by his wife and daughter.  Allergies: No Known Allergies  Facility-Administered Medications Prior to Admission  Medication Dose Route Frequency Provider Last Rate Last Dose  . dexamethasone (DECADRON) injection 10 mg  10 mg Intravenous Once Lequita Asal, MD       Medications Prior to Admission  Medication Sig Dispense Refill  . albuterol (PROVENTIL HFA;VENTOLIN HFA) 108 (90 Base) MCG/ACT inhaler Inhale 1-2 puffs into the lungs every 6 (six) hours as needed for wheezing or shortness of breath. Reported on 09/12/2015    . amLODipine (NORVASC) 5 MG tablet Take 1 tablet (5 mg total) by mouth daily. 30 tablet 12  . benazepril (LOTENSIN) 40 MG tablet Take 40 mg by mouth daily.    . Calcium Carbonate (CALCIUM 600 PO) Take 1,200 mg by mouth 2 (two) times daily.    . colchicine 0.6 MG tablet Take 0.6 mg by mouth daily as needed (two pills by mouth at onset, the one pill one hour later, maximum three pills per gout flare).    Marland Kitchen dexamethasone (DECADRON) 4 MG tablet Take 1 tablet (4 mg total) by mouth daily. 30 tablet 0  . FLUoxetine (PROZAC) 20 MG capsule Take 2 capsules (40 mg total) by mouth every morning. 60 capsule 12  . lidocaine (XYLOCAINE) 2 % solution Use as directed 15 mLs in the mouth or throat every 4 (four) hours as needed for mouth pain. 100 mL 0  . LORazepam (ATIVAN) 0.5 MG tablet Take 1 tablet (0.5 mg total) by mouth every 6 (six) hours as needed. For nausea 20 tablet 0  . nystatin (NYSTATIN) powder Apply topically 3 (three) times daily as needed. (Patient taking differently: Apply topically 3 (three) times daily as needed. For skin irritation) 30 g 1  . ondansetron (ZOFRAN) 8 MG tablet Take 1 tablet (8 mg total) by mouth every 8 (eight)  hours as needed for nausea or vomiting. 20 tablet 3  . oxyCODONE (ROXICODONE) 5 MG immediate release tablet Take 1 tablet (5 mg total) by mouth every 8 (eight) hours as needed. (Patient taking differently: Take 5 mg by mouth every 8 (eight) hours as needed for moderate pain or severe pain. ) 30 tablet 0  . pantoprazole (PROTONIX) 40 MG tablet Take 1 tablet (40 mg total) by mouth daily. 30 tablet 1  . sildenafil (VIAGRA) 100 MG tablet Take 1 tablet (100 mg total) by mouth daily as needed for erectile dysfunction. 10 tablet 12  . dexamethasone (DECADRON) 4 MG tablet Take 2 tablets (8 mg total) by mouth 2 (two) times daily. Start the day before Taxotere. Then daily after chemo for 2 days. 30 tablet 1  . lidocaine-prilocaine (EMLA) cream Apply 1 application topically as needed. 30 g  2    Review of Systems: GENERAL:  Fatigue.  No fevers or sweats.  PERFORMANCE STATUS (ECOG):  2-3 HEENT:  No visual changes, runny nose, sore throat, mouth sores or tenderness. Lungs: Shortness of breath.  Slight cough.  No hemoptysis. Cardiac:  No chest pain, palpitations, orthopnea, or PND. GI:  No nausea, vomiting, diarrhea, constipation, melena or hematochezia. GU:  No urgency, frequency, dysuria, or hematuria. Musculoskeletal:  Low back pain.  No joint pain.  No muscle tenderness. Extremities:  No pain or swelling. Skin:  No rashes or skin changes. Neuro:  No headache, numbness or weakness, balance or coordination issues. Endocrine:  No diabetes, thyroid issues, hot flashes or night sweats. Psych:  No mood changes, depression or anxiety. Pain:  No focal pain. Review of systems:  All other systems reviewed and found to be negative.  Physical Exam:  Blood pressure 134/84, pulse 86, temperature 98 F (36.7 C), temperature source Oral, resp. rate 19, height 5' 6"  (1.676 m), weight 211 lb 13.8 oz (96.1 kg), SpO2 93 %.  GENERAL:  Well developed, well nourished, fatigued appearing gentleman lying in bed in the ICU  with BIPAP in place in moderate respiratory distress.  MENTAL STATUS:  Alert and oriented to person, place and time. HEAD:  Short dark hair.  Normocephalic, atraumatic, face symmetric, no Cushingoid features. EYES:  Pupils equal round and reactive to light and accomodation.  No conjunctivitis or scleral icterus. ENT: BIPAP in place.  Oropharynx clear without lesion.  Tongue normal. Mucous membranes moist.  RESPIRATORY:  Diminished breath sounds with scattered rales in the right chest.  Left chest clear to auscultation with wheezes or rhonchi.  Increased work of breathing. CARDIOVASCULAR:  Regular rate and rhythm without murmur, rub or gallop. ABDOMEN:  Soft, non-tender, with active bowel sounds, and no appreciable hepatosplenomegaly.  No masses. SKIN:  No rashes, ulcers or lesions. EXTREMITIES: Feet cool.  No edema, no skin discoloration or tenderness.  No palpable cords. NEUROLOGICAL: Unremarkable. PSYCH:  Appropriate.   Results for orders placed or performed during the hospital encounter of 09/14/16 (from the past 48 hour(s))  Lactic acid, plasma     Status: Abnormal   Collection Time: 09/14/16  9:35 AM  Result Value Ref Range   Lactic Acid, Venous 2.2 (HH) 0.5 - 1.9 mmol/L    Comment: CRITICAL RESULT CALLED TO, READ BACK BY AND VERIFIED WITH SAMANTHA HAMILTON AT 1022 ON 09/14/16 ALV   Comprehensive metabolic panel     Status: Abnormal   Collection Time: 09/14/16  9:35 AM  Result Value Ref Range   Sodium 135 135 - 145 mmol/L   Potassium 5.0 3.5 - 5.1 mmol/L   Chloride 98 (L) 101 - 111 mmol/L   CO2 26 22 - 32 mmol/L   Glucose, Bld 116 (H) 65 - 99 mg/dL   BUN 43 (H) 6 - 20 mg/dL   Creatinine, Ser 1.69 (H) 0.61 - 1.24 mg/dL   Calcium 9.0 8.9 - 10.3 mg/dL   Total Protein 7.6 6.5 - 8.1 g/dL   Albumin 3.3 (L) 3.5 - 5.0 g/dL   AST 36 15 - 41 U/L   ALT 48 17 - 63 U/L   Alkaline Phosphatase 235 (H) 38 - 126 U/L   Total Bilirubin 0.6 0.3 - 1.2 mg/dL   GFR calc non Af Amer 44 (L) >60  mL/min   GFR calc Af Amer 51 (L) >60 mL/min    Comment: (NOTE) The eGFR has been calculated using the CKD EPI  equation. This calculation has not been validated in all clinical situations. eGFR's persistently <60 mL/min signify possible Chronic Kidney Disease.    Anion gap 11 5 - 15  CBC WITH DIFFERENTIAL     Status: Abnormal   Collection Time: 09/14/16  9:35 AM  Result Value Ref Range   WBC 28.2 (H) 3.8 - 10.6 K/uL   RBC 4.52 4.40 - 5.90 MIL/uL   Hemoglobin 13.3 13.0 - 18.0 g/dL   HCT 42.1 40.0 - 52.0 %   MCV 93.1 80.0 - 100.0 fL   MCH 29.4 26.0 - 34.0 pg   MCHC 31.6 (L) 32.0 - 36.0 g/dL   RDW 19.5 (H) 11.5 - 14.5 %   Platelets 105 (L) 150 - 440 K/uL   Neutrophils Relative % 92 %   Neutro Abs 25.8 (H) 1.4 - 6.5 K/uL   Lymphocytes Relative 3 %   Lymphs Abs 0.9 (L) 1.0 - 3.6 K/uL   Monocytes Relative 5 %   Monocytes Absolute 1.4 (H) 0.2 - 1.0 K/uL   Eosinophils Relative 0 %   Eosinophils Absolute 0.0 0 - 0.7 K/uL   Basophils Relative 0 %   Basophils Absolute 0.1 0 - 0.1 K/uL  Fibrin derivatives D-Dimer ([facility] only)     Status: Abnormal   Collection Time: 09/14/16  9:35 AM  Result Value Ref Range   Fibrin derivatives D-dimer ([facility]) >7,500.00 (H) 0.00 - 499.00    Comment: (NOTE) <> Exclusion of Venous Thromboembolism (VTE) - OUTPATIENT ONLY   (Emergency Department or [place])   0-499 ng/ml (FEU): With a low to intermediate pretest probability                      for VTE this test result excludes the diagnosis                      of VTE.   >499 ng/ml (FEU) : VTE not excluded; additional work up for VTE is                      required. <> Testing on Inpatients and Evaluation of Disseminated Intravascular   Coagulation (DIC) Reference Range:   0-499 ng/ml (FEU)   Troponin I     Status: Abnormal   Collection Time: 09/14/16  9:35 AM  Result Value Ref Range   Troponin I 0.27 (HH) <0.03 ng/mL    Comment: CRITICAL RESULT CALLED TO, READ BACK BY AND VERIFIED WITH SAMANTHA  HAMILTON AT 1014 AT 09/14/16 ALV   APTT     Status: None   Collection Time: 09/14/16  9:35 AM  Result Value Ref Range   aPTT 25 24 - 36 seconds  Protime-INR     Status: None   Collection Time: 09/14/16  9:35 AM  Result Value Ref Range   Prothrombin Time 15.2 11.4 - 15.2 seconds   INR 1.19   Lactic acid, plasma     Status: None   Collection Time: 09/14/16 12:41 PM  Result Value Ref Range   Lactic Acid, Venous 1.6 0.5 - 1.9 mmol/L  Troponin I (q 6hr x 3)     Status: Abnormal   Collection Time: 09/14/16 12:41 PM  Result Value Ref Range   Troponin I 0.27 (HH) <0.03 ng/mL    Comment: CRITICAL VALUE NOTED. VALUE IS CONSISTENT WITH PREVIOUSLY REPORTED/CALLED VALUE ALV  MRSA PCR Screening     Status: None   Collection Time: 09/14/16  1:51 PM  Result Value Ref Range   MRSA by PCR NEGATIVE NEGATIVE    Comment:        The GeneXpert MRSA Assay (FDA approved for NASAL specimens only), is one component of a comprehensive MRSA colonization surveillance program. It is not intended to diagnose MRSA infection nor to guide or monitor treatment for MRSA infections.   Procalcitonin - Baseline     Status: None   Collection Time: 09/14/16  2:16 PM  Result Value Ref Range   Procalcitonin 0.16 ng/mL    Comment:        Interpretation: PCT (Procalcitonin) <= 0.5 ng/mL: Systemic infection (sepsis) is not likely. Local bacterial infection is possible. (NOTE)         ICU PCT Algorithm               Non ICU PCT Algorithm    ----------------------------     ------------------------------         PCT < 0.25 ng/mL                 PCT < 0.1 ng/mL     Stopping of antibiotics            Stopping of antibiotics       strongly encouraged.               strongly encouraged.    ----------------------------     ------------------------------       PCT level decrease by               PCT < 0.25 ng/mL       >= 80% from peak PCT       OR PCT 0.25 - 0.5 ng/mL          Stopping of antibiotics                                              encouraged.     Stopping of antibiotics           encouraged.    ----------------------------     ------------------------------       PCT level decrease by              PCT >= 0.25 ng/mL       < 80% from peak PCT        AND PCT >= 0.5 ng/mL            Continuin g antibiotics                                              encouraged.       Continuing antibiotics            encouraged.    ----------------------------     ------------------------------     PCT level increase compared          PCT > 0.5 ng/mL         with peak PCT AND          PCT >= 0.5 ng/mL             Escalation of antibiotics  strongly encouraged.      Escalation of antibiotics        strongly encouraged.   Heparin level (unfractionated)     Status: Abnormal   Collection Time: 09/14/16  6:34 PM  Result Value Ref Range   Heparin Unfractionated 0.75 (H) 0.30 - 0.70 IU/mL    Comment:        IF HEPARIN RESULTS ARE BELOW EXPECTED VALUES, AND PATIENT DOSAGE HAS BEEN CONFIRMED, SUGGEST FOLLOW UP TESTING OF ANTITHROMBIN III LEVELS.   Troponin I (q 6hr x 3)     Status: Abnormal   Collection Time: 09/14/16  6:34 PM  Result Value Ref Range   Troponin I 0.16 (HH) <0.03 ng/mL    Comment: CRITICAL VALUE NOTED. VALUE IS CONSISTENT WITH PREVIOUSLY REPORTED/CALLED VALUE JJB   Ct Angio Chest Pe W Or Wo Contrast  Result Date: 09/14/2016 CLINICAL DATA:  Pt to ed via acems from home with reports of shortness of breath this am. Pt was to have his third radiation tx today for lung cancer with mets to the brain.PT stated he use to take chemo for treatment EXAM: CT ANGIOGRAPHY CHEST WITH CONTRAST TECHNIQUE: Multidetector CT imaging of the chest was performed using the standard protocol during bolus administration of intravenous contrast. Multiplanar CT image reconstructions and MIPs were obtained to evaluate the vascular anatomy. CONTRAST:  60 mL of Isovue 370 intravenous  contrast COMPARISON:  Current chest radiograph.  PET-CT, 07/08/2016. FINDINGS: Cardiovascular: There is satisfactory opacification of the pulmonary arteries. All There is a saddle embolus in the distal left pulmonary artery with acute pulmonary emboli extending into the segmental branches to the left upper lobe, including the lingula, and left lower lobe. There are no convincing right-sided pulmonary emboli. Right upper lobe pulmonary artery is are narrowed by surrounding soft tissue. RV/LV ratio is 1.0. The great vessels are normal in caliber. No significant thoracic aortic atherosclerosis. Mediastinum/Nodes: No mediastinal masses. No left hilar mass or adenopathy. Mildly enlarged right subcarinal lymph node measuring 16 mm in short axis. There is abnormal soft tissue surrounding the right hilar structures most evident superiorly. This is contiguous with a poorly defined medial right upper lobe mass, which has increased in size when compared to the prior PET-CT. Lungs/Pleura: Medial, pleural based, right upper lobe irregular mass currently measures approximately 4.1 x 3.6 cm transversely, previously approximately 3.0 x 2.5 cm. There is irregular nodular interstitial thickening throughout the aerated right lung with multiple patchy areas of nodular and more ill-defined confluent opacity. There is near complete opacification of the right lower lobe and a significant portion of the right middle lobe, likely postobstructive change and/or atelectasis. There is still a minimal right pleural effusion. In the left lung, there multiple areas of ground-glass and confluent type focal opacity. The largest and most discrete of these is in the left upper lobe lingula with posteriorly measuring 2.6 x 1.7 cm. Although these findings may be metastatic disease, areas of atelectasis and/or pulmonary infarction from pulmonary emboli is suspected. No evidence of pulmonary edema. No left pleural effusion. No pneumothorax. Upper Abdomen:  No acute findings. No visualize liver or adrenal masses. Partly imaged probable gallstone. Musculoskeletal: Multiple sclerotic bone lesions without significant change from the prior PET-CT. Review of the MIP images confirms the above findings. IMPRESSION: 1. Significant acute pulmonary emboli noted involving the left upper and lower lobes, all segments. Positive for acute PE with CT evidence of right heart strain (RV/LV Ratio = 1.0) which is borderline for at least submassive (  intermediate risk) PE. The presence of right heart strain has been associated with an increased risk of morbidity and mortality. Please activate Code PE by paging [phone]. 2. There are new areas of patchy and masslike ground-glass and more confluent left lung opacity suggesting pulmonary infarction. A component of metastatic disease or pneumonia is not excluded. 3. Worsened metastatic disease in the right lung. The mass extending along the medial right upper lobe from the right hilum is larger. There are multiple ill-defined additional areas of lung opacity on the right there are likely metastatic lesions, and there is reticulonodular irregular interstitial thickening consistent with lymphangitic spread. Confluent opacity throughout most of the right lower lobe and right upper lobe is likely predominantly atelectasis, given the presence of volume loss on the right. This has also increased. Small right pleural effusion. Critical Value/emergent results were called by telephone at the time of interpretation on 09/14/2016 at 11:27 am to Dr. Lavonia Drafts , who verbally acknowledged these results. Electronically Signed   By: Lajean Manes M.D.   On: 09/14/2016 11:32   Dg Chest Port 1 View  Result Date: 09/14/2016 CLINICAL DATA:  shortness of breath this am. 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 EXAM: PORTABLE CHEST 1 VIEW COMPARISON:  PET-CT, 07/08/2016.  Chest radiograph, 06/23/2016. FINDINGS:  Since the prior exam, increased opacity has developed on the right. There is airspace type opacity in the right mid to upper lung, predominating centrally. There is more confluent opacity at the right lung base in part likely due to a moderate pleural effusion. Left lung is hyperexpanded with mild increased perihilar vascularity, but otherwise clear. Cardiac silhouette is normal in size. Right hilum and mediastinum are silhouetted by the right lung opacity. No left hilar mass or convincing adenopathy. Skeletal structures are grossly intact. Osteoblastic lesions noted on the prior PET-CT are not well-defined. IMPRESSION: 1. Worsened lung aeration on the right when compared to the prior exams. This is due to a combination of pleural fluid, moderate-sized, and increased from the prior study, as well as increased parenchymal opacity. The parenchymal opacity may be postobstructive consolidation and/or atelectasis, metastatic disease or pneumonia or a combination. Electronically Signed   By: Lajean Manes M.D.   On: 09/14/2016 10:03    Assessment:  The patient is a 56 y.o. gentleman with metastatic adenocarcinoma of the lung s/p several cycles of chemotherapy over the past year.  He initially responded well to therapy.  Last cycle of chemotherapy was on 07/19/2016.  He has been receiving palliative CNS and lumbar radiation to areas of metastatic disease.  He presented today with increasing shortness of breath secondary to progressive disease in the right chest and a new left sided pulmonary embolism.   Plan:   1.  Oncology:  Discuss current scans.  Patient has progressive metastatic disease without further treatment options.  Discuss supportive care alone.  Patient interested in talking to palliative care medicine as well as Hospice.  Patient agreeable to no heroic measures.  He is considering discontinuation of radiation.  Life expectancy is limited.  2.  Hematology:  Left sided pulmonary embolism.  Patient  currently on heparin.  Anticipate discharge when appropriate on oral anticoagulation.  3.  Pulmonary:  Progressive metastatic disease, and acute pulmonary embolism.  Agree with no intubation.  Patient currently comfortable on BIPAP.  Possible post-obstructive consolidation.  Patient currently receiving Cefepime, vancomycin and Zosyn for health care associated pneumonia.  Patient also  receiving albuterol nebs, Pulmicort nebs, Duoneb.  4.  Disposition:  Code status DNR/DNI.  Patient interested in talking to Hospice.    Thank you for allowing me to participate in Stuart Spence 's care.  I will follow him closely with you while hospitalized and after discharge in the outpatient department.   Lequita Asal, MD  09/14/2016, 8:00 PM

## 2016-09-14 NOTE — ED Notes (Signed)
Patient transported to CT 

## 2016-09-14 NOTE — Progress Notes (Signed)
ANTICOAGULATION CONSULT NOTE - Initial Consult  Pharmacy Consult for Heparin Drip Indication: pulmonary embolus  No Known Allergies  Patient Measurements: Height: 5\' 6"  (167.6 cm) Weight: 211 lb 13.8 oz (96.1 kg) IBW/kg (Calculated) : 63.8 Heparin Dosing Weight: 85.23 kg  Vital Signs: Temp: 96.4 F (35.8 C) (06/12 1400) Temp Source: Axillary (06/12 1400) BP: 134/84 (06/12 1800) Pulse Rate: 86 (06/12 1800)  Labs:  Recent Labs  09/14/16 0935 09/14/16 1241  HGB 13.3  --   HCT 42.1  --   PLT 105*  --   APTT 25  --   LABPROT 15.2  --   INR 1.19  --   CREATININE 1.69*  --   TROPONINI 0.27* 0.27*    Estimated Creatinine Clearance: 53.6 mL/min (A) (by C-G formula based on SCr of 1.69 mg/dL (H)).   Medical History: Past Medical History:  Diagnosis Date  . Cancer ([place])    Lung  . Depression   . ED (erectile dysfunction)   . Gout   . Hypertension   . On home oxygen therapy   . Personal history of chemotherapy    PT TAKING CHEMO EVERY 3 WEEKS    Medications:  Scheduled:  . budesonide (PULMICORT) nebulizer solution  0.5 mg Nebulization BID  . [START ON 09/15/2016] FLUoxetine  20 mg Oral Daily  . ipratropium-albuterol  3 mL Nebulization Q4H  . methylPREDNISolone (SOLU-MEDROL) injection  40 mg Intravenous Q12H   Infusions:  . ceFEPime (MAXIPIME) IV    . heparin 1,450 Units/hr (09/14/16 1842)    Assessment: Pharmacy consulted to dose and monitor heparin for pulmonary embolism in this 56 yo male with h/o metastatic lung cancer.   Goal of Therapy:  Heparin level 0.3-0.7 units/ml Monitor platelets by anticoagulation protocol: Yes   Plan:  06/12 heparin level slightly supratherapeutic. Will decrease infusion to 1350 units/hr and recheck HL in 6 hours.   Pernell Dupre, PharmD, BCPS Clinical Pharmacist 09/14/2016 6:55 PM

## 2016-09-14 NOTE — Progress Notes (Signed)
Family Meeting Note  Advance Directive:yes  Today a meeting took place with the Patient, spouse and sister at bed side    The following clinical team members were present during this meeting:MD, RN  The following were discussed:Patient's diagnosis: treatment plan of care discussed  , Patient's progosis: POOR  and Goals for treatment: DNR, wife is HCPOA, considering palliative  care  Additional follow-up to be provided: Hospitalist, palliative care and intensivist  Time spent during discussion:18 min  Asiya Cutbirth, Illene Silver, MD

## 2016-09-14 NOTE — ED Provider Notes (Signed)
[facility] Emergency Department Provider Note   ____________________________________________    I have reviewed the triage vital signs and the nursing notes.   HISTORY  Chief Complaint Shortness of Breath     HPI Stuart Spence is a 56 y.o. male Who presents with shortness of breath. Patient reports a history of lung cancer and has been undergoing radiation therapy, he had radiation treatment yesterday and was scheduled for today but he woke up this morning quite short of breath. EMS reports he was 70% on room air, they started him on a nonrebreather with improvement to 91%.patient denies fevers or chills. No pleurisy. No chest pain calf pain or swelling. He has never experienced this before.    Past Medical History:  Diagnosis Date  . Cancer ([place])    Lung  . Depression   . ED (erectile dysfunction)   . Gout   . Hypertension   . On home oxygen therapy   . Personal history of chemotherapy    PT TAKING CHEMO EVERY 3 WEEKS    Patient Active Problem List   Diagnosis Date Noted  . Non-small cell lung cancer, right ([place]) 08/04/2016  . Dehydration 08/01/2016  . Nausea and vomiting 07/24/2016  . Anemia 07/19/2016  . Renal insufficiency 07/19/2016  . Cancer of upper lobe of right lung ([place]) 07/09/2016  . Goals of care, counseling/discussion 07/09/2016  . Rectal bleeding 06/23/2016  . Shingles 06/22/2016  . Encounter for antineoplastic chemotherapy 05/08/2016  . Encounter for antineoplastic immunotherapy 05/08/2016  . Hypocalcemia 09/12/2015  . Hyperglycemia, unspecified 09/12/2015  . Bone metastases ([place]) 08/20/2015  . Adenocarcinoma, lung ([place]) 08/19/2015  . Pleural effusion on right   . Respiratory failure with hypoxia ([place]) 08/10/2015  . Essential hypertension 06/23/2015  . Depression 06/23/2015    Past Surgical History:  Procedure Laterality Date  . CHEST TUBE INSERTION Left 08/13/2015   Procedure: CHEST TUBE INSERTION;  Surgeon:  Nestor Lewandowsky, MD;  Location: [facility] ORS;  Service: General;  Laterality: Left;  . CHEST TUBE INSERTION N/A 09/18/2015   Procedure: PLEURX CATH REMOVAL;  Surgeon: Nestor Lewandowsky, MD;  Location: [facility] ORS;  Service: Thoracic;  Laterality: N/A;  . PORTACATH PLACEMENT Left 08/13/2015   Procedure: INSERTION PORT-A-CATH;  Surgeon: Nestor Lewandowsky, MD;  Location: [facility] ORS;  Service: General;  Laterality: Left;  . PORTACATH PLACEMENT    . TONSILLECTOMY      Prior to Admission medications   Medication Sig Start Date End Date Taking? Authorizing Provider  albuterol (PROVENTIL HFA;VENTOLIN HFA) 108 (90 Base) MCG/ACT inhaler Inhale 1-2 puffs into the lungs every 6 (six) hours as needed for wheezing or shortness of breath. Reported on 09/12/2015   Yes [provider]  amLODipine (NORVASC) 5 MG tablet Take 1 tablet (5 mg total) by mouth daily. 06/22/16  Yes Crissman, Jeannette How, MD  benazepril (LOTENSIN) 40 MG tablet Take 40 mg by mouth daily.   Yes [provider]  Calcium Carbonate (CALCIUM 600 PO) Take 1,200 mg by mouth 2 (two) times daily.   Yes [provider]  colchicine 0.6 MG tablet Take 0.6 mg by mouth daily as needed (two pills by mouth at onset, the one pill one hour later, maximum three pills per gout flare).   Yes [provider]  dexamethasone (DECADRON) 4 MG tablet Take 1 tablet (4 mg total) by mouth daily. 09/01/16  Yes Chrystal, Eulas Post, MD  FLUoxetine (PROZAC) 20 MG capsule Take 2 capsules (40 mg total) by mouth every  morning. 06/22/16  Yes Crissman, Jeannette How, MD  lidocaine (XYLOCAINE) 2 % solution Use as directed 15 mLs in the mouth or throat every 4 (four) hours as needed for mouth pain. 07/26/16  Yes Sainani, Belia Heman, MD  LORazepam (ATIVAN) 0.5 MG tablet Take 1 tablet (0.5 mg total) by mouth every 6 (six) hours as needed. For nausea 07/29/16  Yes Corcoran, Drue Second, MD  nystatin (NYSTATIN) powder Apply topically 3 (three) times daily as needed. Patient taking differently:  Apply topically 3 (three) times daily as needed. For skin irritation 05/28/16  Yes Corcoran, Drue Second, MD  ondansetron (ZOFRAN) 8 MG tablet Take 1 tablet (8 mg total) by mouth every 8 (eight) hours as needed for nausea or vomiting. 08/02/16  Yes Corcoran, Melissa C, MD  oxyCODONE (ROXICODONE) 5 MG immediate release tablet Take 1 tablet (5 mg total) by mouth every 8 (eight) hours as needed. Patient taking differently: Take 5 mg by mouth every 8 (eight) hours as needed for moderate pain or severe pain.  08/31/16 08/31/17 Yes Corcoran, Drue Second, MD  pantoprazole (PROTONIX) 40 MG tablet Take 1 tablet (40 mg total) by mouth daily. 06/25/16  Yes Henreitta Leber, MD  sildenafil (VIAGRA) 100 MG tablet Take 1 tablet (100 mg total) by mouth daily as needed for erectile dysfunction. 12/24/15  Yes Crissman, Jeannette How, MD  dexamethasone (DECADRON) 4 MG tablet Take 2 tablets (8 mg total) by mouth 2 (two) times daily. Start the day before Taxotere. Then daily after chemo for 2 days. 07/18/16   Lequita Asal, MD  lidocaine-prilocaine (EMLA) cream Apply 1 application topically as needed. 08/21/15   Lequita Asal, MD     Allergies Patient has no known allergies.  Family History  Problem Relation Age of Onset  . Cancer Mother 44       lung  . Heart attack Father 17  . Hypertension Father   . Congestive Heart Failure Father 71       died from  . Diabetes Sister     Social History Social History  Substance Use Topics  . Smoking status: Never Smoker  . Smokeless tobacco: Never Used  . Alcohol use No     Comment: gave it up in August 2016    Review of Systems  Constitutional: No fever/chills Eyes: No visual changes.  ENT: No throat swelling Cardiovascular: Denies chest pain. Respiratory: as above Gastrointestinal: No abdominal pain.  No nausea, no vomiting.   Genitourinary: Negative for dysuria. Musculoskeletal: Negative for back pain. Skin: Negative for rash. Neurological: Negative for  headaches    ____________________________________________   PHYSICAL EXAM:  VITAL SIGNS: ED Triage Vitals  Enc Vitals Group     BP --      Pulse Rate 09/14/16 0932 (!) 104     Resp 09/14/16 0932 (!) 27     Temp --      Temp src --      SpO2 09/14/16 0932 92 %     Weight 09/14/16 0933 98 kg (216 lb)     Height --      Head Circumference --      Peak Flow --      Pain Score --      Pain Loc --      Pain Edu? --      Excl. in [place]? --     Constitutional: Alert and oriented.  Eyes: Conjunctivae are normal.  Head: Atraumatic. Nose: No congestion/rhinnorhea. Mouth/Throat: Mucous membranes are  moist.   Neck:  Painless ROM, no stridor Cardiovascular: Normal rate, regular rhythm. Grossly normal heart sounds.  Good peripheral circulation. Respiratory: Increased respiratory effort, no wheezes no rales Gastrointestinal: Soft and nontender. No distention.  No CVA tenderness. Genitourinary: deferred Musculoskeletal: No lower extremity tenderness nor edema.  Warm and well perfused Neurologic:  Normal speech and language. No gross focal neurologic deficits are appreciated.  Skin:  Skin is warm, dry and intact. No rash noted. Psychiatric: Mood and affect are normal. Speech and behavior are normal.  ____________________________________________   LABS (all labs ordered are listed, but only abnormal results are displayed)  Labs Reviewed  LACTIC ACID, PLASMA - Abnormal; Notable for the following:       Result Value   Lactic Acid, Venous 2.2 (*)    All other components within normal limits  COMPREHENSIVE METABOLIC PANEL - Abnormal; Notable for the following:    Chloride 98 (*)    Glucose, Bld 116 (*)    BUN 43 (*)    Creatinine, Ser 1.69 (*)    Albumin 3.3 (*)    Alkaline Phosphatase 235 (*)    GFR calc non Af Amer 44 (*)    GFR calc Af Amer 51 (*)    All other components within normal limits  CBC WITH DIFFERENTIAL/PLATELET - Abnormal; Notable for the following:    WBC 28.2 (*)     MCHC 31.6 (*)    RDW 19.5 (*)    Platelets 105 (*)    Neutro Abs 25.8 (*)    Lymphs Abs 0.9 (*)    Monocytes Absolute 1.4 (*)    All other components within normal limits  FIBRIN DERIVATIVES D-DIMER ([facility] ONLY) - Abnormal; Notable for the following:    Fibrin derivatives D-dimer ([facility]) >7,500.00 (*)    All other components within normal limits  TROPONIN I - Abnormal; Notable for the following:    Troponin I 0.27 (*)    All other components within normal limits  CULTURE, BLOOD (ROUTINE X 2)  CULTURE, BLOOD (ROUTINE X 2)  LACTIC ACID, PLASMA   ____________________________________________  EKG  ED ECG REPORT I, Lavonia Drafts, the attending physician, personally viewed and interpreted this ECG.  Date: 09/14/2016 EKG Time: 9:33 AM Rate: 103 Rhythm: Sinus tachycardia QRS Axis: normal Intervals: normal ST/T Wave abnormalities: normal   ____________________________________________  RADIOLOGY  CT angiography demonstrates likely pleural infarcts, pleural embolism, pleural effusion ____________________________________________   PROCEDURES  Procedure(s) performed: No    Critical Care performed: yes  CRITICAL CARE Performed by: Lavonia Drafts   Total critical care time: 35 minutes  Critical care time was exclusive of separately billable procedures and treating other patients.  Critical care was necessary to treat or prevent imminent or life-threatening deterioration.  Critical care was time spent personally by me on the following activities: development of treatment plan with patient and/or surrogate as well as nursing, discussions with consultants, evaluation of patient's response to treatment, examination of patient, obtaining history from patient or surrogate, ordering and performing treatments and interventions, ordering and review of laboratory studies, ordering and review of radiographic studies, pulse oximetry and re-evaluation of patient's  condition.  ____________________________________________   INITIAL IMPRESSION / ASSESSMENT AND PLAN / ED COURSE  Pertinent labs & imaging results that were available during my care of the patient were reviewed by me and considered in my medical decision making (see chart for details).  Patient presents with shortness of breath. 70% on room air and on nonrebreather only achieving 91%.  He denies pleurisy. No calf pain or swelling. Chest x-ray demonstrates airspace disease and pleural effusions. D-dimer is markedly elevated, we will sent for CT scan as well. Elevated white blood cell count with mildly elevated lactic acid. Patient is being treated broadly with vancomycin and Zosyn.  I will transition him to BiPAP as well  Discussed with Dr. Mike Gip his oncologist, she reports he has refused further chemotherapy and is receiving palliative radiation of the spine and brain metastases  Patient with elevated troponin and this is likely related to strain from shortness of breath/pneumonia and/or PE  ----------------------------------------- 11:39 AM on 09/14/2016 -----------------------------------------  Called by radiologist notified of pulmonary embolism, pleural effusion, likely pneumonia and possible pulmonary infarcts. I'll start the patient on heparin drip as the benefits outweigh the risks given his condition    ____________________________________________   FINAL CLINICAL IMPRESSION(S) / ED DIAGNOSES  Final diagnoses:  SOB (shortness of breath)  Other acute pulmonary embolism with acute cor pulmonale (HCC)  Shortness of breath  Pleural effusion      NEW MEDICATIONS STARTED DURING THIS VISIT:  New Prescriptions   No medications on file     Note:  This document was prepared using Dragon voice recognition software and may include unintentional dictation errors.    Lavonia Drafts, MD 09/14/16 1140

## 2016-09-14 NOTE — ED Triage Notes (Signed)
Pt to ed via [facility] from home with reports of shortness of breath this am. 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 Ems reports spo2 in the low 90's, 92% on a NRB at 15liters. Pt was 81% on 8L [place]. bp 113/57.

## 2016-09-14 NOTE — ED Notes (Signed)
Pt placed on monitor and EKG performed

## 2016-09-14 NOTE — ED Notes (Signed)
Date and time results received: 09/14/16 1020 (use smartphrase ".now" to insert current time)  Test: troponin Critical Value: 0.27  Name of Provider Notified: Dr. Corky Downs  Orders Received? Or Actions Taken?: Orders Received - See Orders for details

## 2016-09-14 NOTE — ED Notes (Signed)
Family at bedside. 

## 2016-09-14 NOTE — ED Notes (Signed)
RT at bedside placing patient on bipap

## 2016-09-15 ENCOUNTER — Ambulatory Visit: Payer: PRIVATE HEALTH INSURANCE

## 2016-09-15 ENCOUNTER — Telehealth: Payer: Self-pay | Admitting: *Deleted

## 2016-09-15 DIAGNOSIS — R0602 Shortness of breath: Secondary | ICD-10-CM

## 2016-09-15 DIAGNOSIS — J9601 Acute respiratory failure with hypoxia: Secondary | ICD-10-CM

## 2016-09-15 DIAGNOSIS — G893 Neoplasm related pain (acute) (chronic): Secondary | ICD-10-CM

## 2016-09-15 LAB — CBC
HCT: 39.1 % — ABNORMAL LOW (ref 40.0–52.0)
Hemoglobin: 12.4 g/dL — ABNORMAL LOW (ref 13.0–18.0)
MCH: 29.8 pg (ref 26.0–34.0)
MCHC: 31.8 g/dL — ABNORMAL LOW (ref 32.0–36.0)
MCV: 93.9 fL (ref 80.0–100.0)
Platelets: 120 10*3/uL — ABNORMAL LOW (ref 150–440)
RBC: 4.17 MIL/uL — AB (ref 4.40–5.90)
RDW: 19.8 % — AB (ref 11.5–14.5)
WBC: 29.9 10*3/uL — ABNORMAL HIGH (ref 3.8–10.6)

## 2016-09-15 LAB — HEPARIN LEVEL (UNFRACTIONATED): HEPARIN UNFRACTIONATED: 0.53 [IU]/mL (ref 0.30–0.70)

## 2016-09-15 LAB — HIV ANTIBODY (ROUTINE TESTING W REFLEX): HIV Screen 4th Generation wRfx: NONREACTIVE

## 2016-09-15 LAB — PROCALCITONIN: Procalcitonin: 0.14 ng/mL

## 2016-09-15 LAB — TROPONIN I: Troponin I: 0.09 ng/mL (ref <0.03)

## 2016-09-15 MED ORDER — OXYCODONE HCL 5 MG PO TABS
10.0000 mg | ORAL_TABLET | ORAL | Status: DC | PRN
  Administered 2016-09-15 – 2016-09-16 (×4): 10 mg via ORAL
  Filled 2016-09-15 (×4): qty 2

## 2016-09-15 MED ORDER — APIXABAN 5 MG PO TABS: 5.0000 mg | ORAL_TABLET | Freq: Two times a day (BID) | ORAL | Status: DC

## 2016-09-15 MED ORDER — LEVOFLOXACIN 750 MG PO TABS
750.0000 mg | ORAL_TABLET | Freq: Every day | ORAL | Status: DC
  Administered 2016-09-15 – 2016-09-16 (×2): 750 mg via ORAL
  Filled 2016-09-15: qty 2
  Filled 2016-09-15 (×2): qty 1

## 2016-09-15 MED ORDER — PREDNISONE 10 MG PO TABS: 10.0000 mg | ORAL_TABLET | Freq: Every day | ORAL | Status: DC

## 2016-09-15 MED ORDER — PREDNISONE 20 MG PO TABS: 20.0000 mg | ORAL_TABLET | Freq: Every day | ORAL | Status: DC

## 2016-09-15 MED ORDER — PREDNISONE 50 MG PO TABS
50.0000 mg | ORAL_TABLET | Freq: Every day | ORAL | Status: DC
  Administered 2016-09-16: 50 mg via ORAL
  Filled 2016-09-15: qty 1

## 2016-09-15 MED ORDER — PREDNISONE 20 MG PO TABS: 40.0000 mg | ORAL_TABLET | Freq: Every day | ORAL | Status: DC

## 2016-09-15 MED ORDER — MORPHINE SULFATE (CONCENTRATE) 10 MG/0.5ML PO SOLN
5.0000 mg | ORAL | Status: DC | PRN
Start: 2016-09-15 — End: 2016-09-15

## 2016-09-15 MED ORDER — MORPHINE SULFATE (CONCENTRATE) 10 MG/0.5ML PO SOLN: 5.0000 mg | ORAL | Status: DC | PRN

## 2016-09-15 MED ORDER — APIXABAN 5 MG PO TABS
10.0000 mg | ORAL_TABLET | Freq: Two times a day (BID) | ORAL | Status: DC
  Administered 2016-09-15 – 2016-09-16 (×3): 10 mg via ORAL
  Filled 2016-09-15 (×3): qty 2

## 2016-09-15 MED ORDER — IPRATROPIUM-ALBUTEROL 0.5-2.5 (3) MG/3ML IN SOLN
3.0000 mL | Freq: Four times a day (QID) | RESPIRATORY_TRACT | Status: DC
  Administered 2016-09-15 – 2016-09-16 (×5): 3 mL via RESPIRATORY_TRACT
  Filled 2016-09-15 (×5): qty 3

## 2016-09-15 NOTE — Telephone Encounter (Signed)
done

## 2016-09-15 NOTE — Care Management (Signed)
This RNCM received call from [facility] with [facility] case management stating she would like to be involved and assist with any discharge planning needs of this patient. Patient is currently on chronic O2 through [place] per [place]. [phone] ext [phone].

## 2016-09-15 NOTE — Consult Note (Signed)
[facility] Consultation      Assessment and Plan:   Stage 4 lung cancer.  -Currently followed by oncology, he has progressive disease despite multiple regimens. Prognosis appears to be very poor. -Continue palliative care. Discussions, hospice transition would be appropriate.  Pulmonary embolism.  -With acute hypoxic respiratory failure. -We will transition from IV heparin to oral agent.  Possible pneumonia with acute respiratory failure.  -We'll narrow coverage to Levaquin. -Changing IV steroids to oral steroid taper.   Date: 09/15/2016  MRN# [id number] Stuart Spence [birth year]  Referring Physician: Dr. Margaretmary Eddy for dyspnea.   Stuart Spence is a 56 y.o. old male seen in consultation for chief complaint of:    Chief Complaint  Patient presents with  . Shortness of Breath    HPI:   Patient is a 56 year old male with stage IV non-small cell lung cancer, admitted with acute shortness of breath. I personally reviewed the patient's imaging, this is consistent with a new onset acute left lung pulmonary Melissa. In addition, he has scattered changes in the left lung which could represent pulmonary infarction versus infection versus metastatic disease from the opposite lung. He continues to have the diffuse right-sided changes consistent with diffuse lung cancer. The patient had radiation treatment yesterday, he woke up with significant shortness of breath. He was evaluated by EMS, found that he was hypoxic, subsequently the patient was brought to the ED, CT angiogram was performed. He was initially on nonrebreather, since moving up to the intensive care unit. He has been placed on high flow oxygen at 50%.  Currently the patient is awake and alert, though somewhat sleepy. He has no particular complaints, he feels that his breathing is better.   PMHX:   Past Medical History:  Diagnosis Date  . Cancer ([place])    Lung  . Depression   . ED (erectile dysfunction)   .  Gout   . Hypertension   . On home oxygen therapy   . Personal history of chemotherapy    PT TAKING CHEMO EVERY 3 WEEKS   Surgical Hx:  Past Surgical History:  Procedure Laterality Date  . CHEST TUBE INSERTION Left 08/13/2015   Procedure: CHEST TUBE INSERTION;  Surgeon: Nestor Lewandowsky, MD;  Location: [facility] ORS;  Service: General;  Laterality: Left;  . CHEST TUBE INSERTION N/A 09/18/2015   Procedure: PLEURX CATH REMOVAL;  Surgeon: Nestor Lewandowsky, MD;  Location: [facility] ORS;  Service: Thoracic;  Laterality: N/A;  . PORTACATH PLACEMENT Left 08/13/2015   Procedure: INSERTION PORT-A-CATH;  Surgeon: Nestor Lewandowsky, MD;  Location: [facility] ORS;  Service: General;  Laterality: Left;  . PORTACATH PLACEMENT    . TONSILLECTOMY     Family Hx:  Family History  Problem Relation Age of Onset  . Cancer Mother 90       lung  . Heart attack Father 66  . Hypertension Father   . Congestive Heart Failure Father 106       died from  . Diabetes Sister    Social Hx:   Social History  Substance Use Topics  . Smoking status: Never Smoker  . Smokeless tobacco: Never Used  . Alcohol use No     Comment: gave it up in August 2016   Medication:    Current Facility-Administered Medications:  .  albuterol (PROVENTIL) (2.5 MG/3ML) 0.083% nebulizer solution 2.5 mg, 2.5 mg, Nebulization, Q4H PRN, Gouru, Aruna, MD .  apixaban (ELIQUIS) tablet 10 mg, 10 mg, Oral, BID **FOLLOWED BY** [START ON 09/22/2016] apixaban (  ELIQUIS) tablet 5 mg, 5 mg, Oral, BID, Mody, Sital, MD .  budesonide (PULMICORT) nebulizer solution 0.5 mg, 0.5 mg, Nebulization, BID, Kasa, Kurian, MD, 0.5 mg at 09/15/16 0840 .  FLUoxetine (PROZAC) capsule 20 mg, 20 mg, Oral, Daily, Kasa, Kurian, MD .  hydrALAZINE (APRESOLINE) injection 10 mg, 10 mg, Intravenous, Q4H PRN, Mortimer Fries, Kurian, MD .  ipratropium-albuterol (DUONEB) 0.5-2.5 (3) MG/3ML nebulizer solution 3 mL, 3 mL, Nebulization, Q4H, Kasa, Kurian, MD, 3 mL at 09/15/16 0840 .  levofloxacin (LEVAQUIN) tablet 750  mg, 750 mg, Oral, Daily, Ashby Dawes, Maelin Kurkowski, MD .  lip balm (BLISTEX) ointment, , Topical, PRN, Flora Lipps, MD .  morphine CONCENTRATE 10 MG/0.5ML oral solution 5 mg, 5 mg, Oral, Q2H PRN, Knox Royalty, NP .  oxyCODONE (Oxy IR/ROXICODONE) immediate release tablet 10 mg, 10 mg, Oral, Q4H PRN, Knox Royalty, NP .  Derrill Memo ON 09/16/2016] predniSONE (DELTASONE) tablet 50 mg, 50 mg, Oral, Q breakfast **FOLLOWED BY** [START ON 09/18/2016] predniSONE (DELTASONE) tablet 40 mg, 40 mg, Oral, Q breakfast **FOLLOWED BY** [START ON 09/20/2016] predniSONE (DELTASONE) tablet 20 mg, 20 mg, Oral, Q breakfast **FOLLOWED BY** [START ON 09/22/2016] predniSONE (DELTASONE) tablet 10 mg, 10 mg, Oral, Q breakfast, Laverle Hobby, MD   Allergies:  Patient has no known allergies.  Review of Systems: Gen:  Denies  fever, sweats, chills HEENT: Denies blurred vision, double vision. bleeds, sore throat Cvc:  No dizziness, chest pain. Resp:   Denies cough or sputum production. Gi: Denies swallowing difficulty, stomach pain. Gu:  Denies bladder incontinence, burning urine Ext:   No Joint pain, stiffness. Skin: No skin rash,  hives  Endoc:  No polyuria, polydipsia. Psych: No depression, insomnia. Other:  All other systems were reviewed with the patient and were negative other that what is mentioned in the HPI.   Physical Examination:   VS: BP 137/84   Pulse 93   Temp 97 F (36.1 C) (Oral)   Resp (!) 24   Ht 5\' 6"  (1.676 m)   Wt 211 lb 13.8 oz (96.1 kg)   SpO2 96%   BMI 34.20 kg/m   General Appearance: No distress  Neuro:without focal findings,  speech normal,  HEENT: PERRLA, EOM intact.   Pulmonary: normal breath sounds, No wheezing.  CardiovascularNormal S1,S2.  No m/r/g.   Abdomen: Benign, Soft, non-tender. Renal:  No costovertebral tenderness  GU:  No performed at this time. Endoc: No evident thyromegaly, no signs of acromegaly. Skin:   warm, no rashes, no ecchymosis  Extremities: normal, no  cyanosis, clubbing.  Other findings:    LABORATORY PANEL:   CBC  Recent Labs Lab 09/15/16 0652  WBC 29.9*  HGB 12.4*  HCT 39.1*  PLT 120*   ------------------------------------------------------------------------------------------------------------------  Chemistries   Recent Labs Lab 09/14/16 0935  NA 135  K 5.0  CL 98*  CO2 26  GLUCOSE 116*  BUN 43*  CREATININE 1.69*  CALCIUM 9.0  AST 36  ALT 48  ALKPHOS 235*  BILITOT 0.6   ------------------------------------------------------------------------------------------------------------------  Cardiac Enzymes  Recent Labs Lab 09/15/16 0652  TROPONINI 0.09*   ------------------------------------------------------------  RADIOLOGY:  Ct Angio Chest Pe W Or Wo Contrast  Result Date: 09/14/2016 CLINICAL DATA:  Pt to ed via acems from home with reports of shortness of breath this am. Pt was to have his third radiation tx today for lung cancer with mets to the brain.PT stated he use to take chemo for treatment EXAM: CT ANGIOGRAPHY CHEST WITH CONTRAST TECHNIQUE: Multidetector CT imaging of  the chest was performed using the standard protocol during bolus administration of intravenous contrast. Multiplanar CT image reconstructions and MIPs were obtained to evaluate the vascular anatomy. CONTRAST:  60 mL of Isovue 370 intravenous contrast COMPARISON:  Current chest radiograph.  PET-CT, 07/08/2016. FINDINGS: Cardiovascular: There is satisfactory opacification of the pulmonary arteries. All There is a saddle embolus in the distal left pulmonary artery with acute pulmonary emboli extending into the segmental branches to the left upper lobe, including the lingula, and left lower lobe. There are no convincing right-sided pulmonary emboli. Right upper lobe pulmonary artery is are narrowed by surrounding soft tissue. RV/LV ratio is 1.0. The great vessels are normal in caliber. No significant thoracic aortic atherosclerosis.  Mediastinum/Nodes: No mediastinal masses. No left hilar mass or adenopathy. Mildly enlarged right subcarinal lymph node measuring 16 mm in short axis. There is abnormal soft tissue surrounding the right hilar structures most evident superiorly. This is contiguous with a poorly defined medial right upper lobe mass, which has increased in size when compared to the prior PET-CT. Lungs/Pleura: Medial, pleural based, right upper lobe irregular mass currently measures approximately 4.1 x 3.6 cm transversely, previously approximately 3.0 x 2.5 cm. There is irregular nodular interstitial thickening throughout the aerated right lung with multiple patchy areas of nodular and more ill-defined confluent opacity. There is near complete opacification of the right lower lobe and a significant portion of the right middle lobe, likely postobstructive change and/or atelectasis. There is still a minimal right pleural effusion. In the left lung, there multiple areas of ground-glass and confluent type focal opacity. The largest and most discrete of these is in the left upper lobe lingula with posteriorly measuring 2.6 x 1.7 cm. Although these findings may be metastatic disease, areas of atelectasis and/or pulmonary infarction from pulmonary emboli is suspected. No evidence of pulmonary edema. No left pleural effusion. No pneumothorax. Upper Abdomen: No acute findings. No visualize liver or adrenal masses. Partly imaged probable gallstone. Musculoskeletal: Multiple sclerotic bone lesions without significant change from the prior PET-CT. Review of the MIP images confirms the above findings. IMPRESSION: 1. Significant acute pulmonary emboli noted involving the left upper and lower lobes, all segments. Positive for acute PE with CT evidence of right heart strain (RV/LV Ratio = 1.0) which is borderline for at least submassive (intermediate risk) PE. The presence of right heart strain has been associated with an increased risk of morbidity  and mortality. Please activate Code PE by paging [phone]. 2. There are new areas of patchy and masslike ground-glass and more confluent left lung opacity suggesting pulmonary infarction. A component of metastatic disease or pneumonia is not excluded. 3. Worsened metastatic disease in the right lung. The mass extending along the medial right upper lobe from the right hilum is larger. There are multiple ill-defined additional areas of lung opacity on the right there are likely metastatic lesions, and there is reticulonodular irregular interstitial thickening consistent with lymphangitic spread. Confluent opacity throughout most of the right lower lobe and right upper lobe is likely predominantly atelectasis, given the presence of volume loss on the right. This has also increased. Small right pleural effusion. Critical Value/emergent results were called by telephone at the time of interpretation on 09/14/2016 at 11:27 am to Dr. Lavonia Drafts , who verbally acknowledged these results. Electronically Signed   By: Lajean Manes M.D.   On: 09/14/2016 11:32   Dg Chest Port 1 View  Result Date: 09/14/2016 CLINICAL DATA:  shortness of breath this am. Pt was  to have his third radiation tx today for lung cancer with mets to the brain. EXAM: PORTABLE CHEST 1 VIEW COMPARISON:  PET-CT, 07/08/2016.  Chest radiograph, 06/23/2016. FINDINGS: Since the prior exam, increased opacity has developed on the right. There is airspace type opacity in the right mid to upper lung, predominating centrally. There is more confluent opacity at the right lung base in part likely due to a moderate pleural effusion. Left lung is hyperexpanded with mild increased perihilar vascularity, but otherwise clear. Cardiac silhouette is normal in size. Right hilum and mediastinum are silhouetted by the right lung opacity. No left hilar mass or convincing adenopathy. Skeletal structures are grossly intact. Osteoblastic lesions noted on the prior PET-CT  are not well-defined. IMPRESSION: 1. Worsened lung aeration on the right when compared to the prior exams. This is due to a combination of pleural fluid, moderate-sized, and increased from the prior study, as well as increased parenchymal opacity. The parenchymal opacity may be postobstructive consolidation and/or atelectasis, metastatic disease or pneumonia or a combination. Electronically Signed   By: Lajean Manes M.D.   On: 09/14/2016 10:03       Thank  you for the consultation and for allowing [facility] to assist in the care of your patient. Our recommendations are noted above.  Please contact us if we can be of further service.   Marda Stalker, MD.  Board Certified in Internal Medicine, Pulmonary Medicine, [place], and Sleep Medicine.  [facility] Office Number: [phone]  Patricia Pesa, M.D.  Merton Border, M.D  09/15/2016

## 2016-09-15 NOTE — Progress Notes (Signed)
[place] for apixiban dosing  Indication: pulmonary embolus   Pharmacy consulted to dose and monitor heparin for pulmonary embolism in this 56 yo male with h/o metastatic lung cancer. Patient transitioned from Heparin to apixaban during AM rounds on 6/13.   Plan:  Will initiate patient on apixban 10mg  BID x 7 days followed by apixiban 5mg  BID. Current plan is for patient to transition to hospice care. Will continue to follow and adjust per protocol.    No Known Allergies  Patient Measurements: Height: 5\' 6"  (167.6 cm) Weight: 211 lb 13.8 oz (96.1 kg) IBW/kg (Calculated) : 63.8 Heparin Dosing Weight: 85.23 kg  Vital Signs: Temp: 97 F (36.1 C) (06/13 0800) Temp Source: Oral (06/13 0800) BP: 137/84 (06/13 1000) Pulse Rate: 93 (06/13 1000)  Labs:  Recent Labs  09/14/16 0935 09/14/16 1241 09/14/16 1834 09/15/16 0652  HGB 13.3  --   --  12.4*  HCT 42.1  --   --  39.1*  PLT 105*  --   --  120*  APTT 25  --   --   --   LABPROT 15.2  --   --   --   INR 1.19  --   --   --   HEPARINUNFRC  --   --  0.75* 0.53  CREATININE 1.69*  --   --   --   TROPONINI 0.27* 0.27* 0.16* 0.09*    Estimated Creatinine Clearance: 53.6 mL/min (A) (by C-G formula based on SCr of 1.69 mg/dL (H)).   Medical History: Past Medical History:  Diagnosis Date  . Cancer ([place])    Lung  . Depression   . ED (erectile dysfunction)   . Gout   . Hypertension   . On home oxygen therapy   . Personal history of chemotherapy    PT TAKING CHEMO EVERY 3 WEEKS    Medications:  Scheduled:  . apixaban  10 mg Oral BID   Followed by  . [START ON 09/22/2016] apixaban  5 mg Oral BID  . budesonide (PULMICORT) nebulizer solution  0.5 mg Nebulization BID  . FLUoxetine  20 mg Oral Daily  . ipratropium-albuterol  3 mL Nebulization Q6H  . levofloxacin  750 mg Oral Daily  . [START ON 09/16/2016] predniSONE  50 mg Oral Q breakfast   Followed by  . [START ON 09/18/2016]  predniSONE  40 mg Oral Q breakfast   Followed by  . [START ON 09/20/2016] predniSONE  20 mg Oral Q breakfast   Followed by  . [START ON 09/22/2016] predniSONE  10 mg Oral Q breakfast   Infusions:     Jeni Duling L 09/15/2016 2:03 PM

## 2016-09-15 NOTE — Telephone Encounter (Signed)
Stuart Spence called asking if Dr Mike Gip is in agreement with Hospice care and if she will sign orders for Stuart Spence for Hospice. Please return her call [phone]

## 2016-09-15 NOTE — Progress Notes (Signed)
Daily Progress Note   Patient Name: Stuart Spence       Date: 09/15/2016 DOB: [birth year]  Age: 56 y.o. MRN#: [id number] Attending Physician: Bettey Costa, MD Primary Care Physician: Guadalupe Maple, MD Admit Date: 09/14/2016  Reason for Consultation/Follow-up: Establishing goals of care, Hospice Evaluation and Psychosocial/spiritual support  Subjective:  - continued conversion at the patient's bedside along with wife, sister and aunt  to discuss diagnosis, prognosis, GOC, EOL wishes disposition and options.  - patient verbalizes an understanding of his limited prognosis and his desire to go home with hospice. He does not want to continue with radiation  A  discussion was had today regarding advanced directives.  Concepts specific to code status, artifical feeding and hydration, continued IV antibiotics and rehospitalization was had.  The difference between a aggressive medical intervention path  and a palliative comfort care path for this patient at this time was had.  Values and goals of care important to patient and family were attempted to be elicited.   Hospice benefit was discussed  Natural trajectory and expectations at EOL were discussed.  Questions and concerns addressed.   Family encouraged to call with questions or concerns.  PMT will continue to support holistically.   Length of Stay: 1  Current Medications: Scheduled Meds:  . apixaban  10 mg Oral BID   Followed by  . [START ON 09/22/2016] apixaban  5 mg Oral BID  . budesonide (PULMICORT) nebulizer solution  0.5 mg Nebulization BID  . FLUoxetine  20 mg Oral Daily  . ipratropium-albuterol  3 mL Nebulization Q4H  . methylPREDNISolone (SOLU-MEDROL) injection  40 mg Intravenous Q12H    Continuous Infusions: . ceFEPime  (MAXIPIME) IV Stopped (09/14/16 2202)    PRN Meds: albuterol, hydrALAZINE, lip balm, morphine, oxyCODONE  Physical Exam  Constitutional: He is oriented to person, place, and time. He appears well-developed. He appears ill.  Cardiovascular: Normal rate, regular rhythm and normal heart sounds.   Pulmonary/Chest: He has decreased breath sounds in the right middle field, the right lower field and the left lower field.  Neurological: He is alert and oriented to person, place, and time.  Skin: Skin is warm and dry.            Vital Signs: BP 137/84   Pulse 93   Temp 97 F (  36.1 C) (Oral)   Resp (!) 24   Ht 5\' 6"  (1.676 m)   Wt 96.1 kg (211 lb 13.8 oz)   SpO2 96%   BMI 34.20 kg/m  SpO2: SpO2: 96 % O2 Device: O2 Device: High Flow Nasal Cannula O2 Flow Rate: O2 Flow Rate (L/min): 40 L/min  Intake/output summary:  Intake/Output Summary (Last 24 hours) at 09/15/16 1035 Last data filed at 09/15/16 3546  Gross per 24 hour  Intake           761.93 ml  Output              950 ml  Net          -188.07 ml   LBM: Last BM Date: 09/13/16 Baseline Weight: Weight: 98 kg (216 lb) Most recent weight: Weight: 96.1 kg (211 lb 13.8 oz)       Palliative Assessment/Data:  40 %    Flowsheet Rows     Most Recent Value  Intake Tab  Referral Department  Hospitalist  Unit at Time of Referral  ICU  Palliative Care Primary Diagnosis  Cancer  Date Notified  09/14/16  Palliative Care Type  New Palliative care  Reason for referral  Clarify Goals of Care  Date of Admission  09/14/16  # of days IP prior to Palliative referral  0  Clinical Assessment  Psychosocial & Spiritual Assessment  Palliative Care Outcomes      Patient Active Problem List   Diagnosis Date Noted  . Acute respiratory failure with hypoxia ([place]) 09/14/2016  . Palliative care by specialist   . DNR (do not resuscitate)   . Lung cancer metastatic to brain ([place]) 08/04/2016  . Dehydration 08/01/2016  . Nausea and vomiting  07/24/2016  . Anemia 07/19/2016  . Renal insufficiency 07/19/2016  . Cancer of upper lobe of right lung ([place]) 07/09/2016  . Goals of care, counseling/discussion 07/09/2016  . Rectal bleeding 06/23/2016  . Shingles 06/22/2016  . Encounter for antineoplastic chemotherapy 05/08/2016  . Encounter for antineoplastic immunotherapy 05/08/2016  . Hypocalcemia 09/12/2015  . Hyperglycemia, unspecified 09/12/2015  . Bone metastases ([place]) 08/20/2015  . Adenocarcinoma, lung ([place]) 08/19/2015  . Pleural effusion on right   . Respiratory failure with hypoxia ([place]) 08/10/2015  . Essential hypertension 06/23/2015  . Depression 06/23/2015    Palliative Care Assessment & Plan   Patient Profile:  stage IV adenocarcinoma of the lung who was admitted through the emergency room with shortness of breath secondary to left sided pulmonary embolism, progressive metastatic disease, and possible health care associated pneumonia.   Assessment: Progression of disease, limited treatment options.  Patient has made decision for comfort approach and home with hospice services  Recommendations/Plan:  Wean off HF oxygen and shift to [place]- no excalation to HF or BiPap, convert to oral medication; antibiotics and anticoagulants  Symptom management          Pain:  oxycodone 10 mg po every 4 hrs prn         Dyspnea: Roxanol 5 mg po/sl every 3 hrs prn *  Home with hospice services       Goals of Care and Additional Recommendations:  Limitations on Scope of Treatment: Full Comfort Care  Code Status:    Code Status Orders        Start     Ordered   09/14/16 1311  Do not attempt resuscitation (DNR)  Continuous    Question Answer Comment  In the event of cardiac or respiratory  ARREST Do not call a "code blue"   In the event of cardiac or respiratory ARREST Do not perform Intubation, CPR, defibrillation or ACLS   In the event of cardiac or respiratory ARREST Use medication by any route, position, wound care,  and other measures to relive pain and suffering. May use oxygen, suction and manual treatment of airway obstruction as needed for comfort.   Comments RN may pronounce      09/14/16 1311    Code Status History    Date Active Date Inactive Code Status Order ID Comments User Context   07/24/2016  8:38 PM 07/27/2016  4:54 PM DNR [id number]  Demetrios Loll, MD Inpatient   06/23/2016  8:51 PM 06/25/2016  5:29 PM Full Code [id number]  Henreitta Leber, MD Inpatient   08/10/2015  5:01 PM 08/17/2015  4:50 PM Full Code [id number]  Hower, Aaron Mose, MD ED    Advance Directive Documentation     Most Recent Value  Type of Advance Directive  Living will  Pre-existing out of facility DNR order (yellow form or pink MOST form)  -  "MOST" Form in Place?  -       Prognosis: Less than 3 months  Discharge Planning:  Home with Hospice  Care plan was discussed with Dr Benjie Karvonen and Dr Dian Situ  Thank you for allowing the Palliative Medicine Team to assist in the care of this patient.   Time In: 1000 Time Out: 1045 Total Time 45 min Prolonged Time Billed  no       Greater than 50%  of this time was spent counseling and coordinating care related to the above assessment and plan.  Wadie Lessen, NP  Please contact Palliative Medicine Team phone at [phone] for questions and concerns.

## 2016-09-15 NOTE — Progress Notes (Signed)
New referral for [facility] services at home received from [facility]. Stuart Spence is a 56 year old man with stage IV cancer of the lung who was admitted through the emergency room with shortness of breath secondary to left sided pulmonary embolism, progressive metastatic disease, and possible health care associated pneumonia. Prior to admission he had received 1 treatment of whole brain radiation. Per chart note review patient awoke on 6/12 with dyspnea, EMS was called and he was transported to [facility], he has required Bipap, IV steroids and IV antibiotics. He is currently on Hi flo oxygen His baseline oxygen requirement at home was 2 liters. Palliative Medicine has been involved for goals of care and symptom management. Patient seen sitting up in bed, sister and aunt at bedside, patient appeared to be resting with eyes closed. Writer spoke at first with his wife [name], who was out in the waiting room. Education initiated regarding hospice services, philosophy and team approach to care with good understanding voiced. DME was discussed. Patient's wife declined a hospital bed at this time, will readdress closer to discharge. Writer asked permission to speak with patient and his sister in the room, [name] agreed.Shje did not Proofreader to the room. Writer then met in the room with [name], his sister and brother in law. Education was provided, questions answered. Prior to admission patient was ambulatory and only used his oxygen at night. He is requiring continuous oxygen now.  [name] was alert through out the visit, did doze off and on, denied pain, no signs of increased work of breathing noted. He is slow to respond, with a flat affect. He does have back pain and takes oxycodone 10 mg q 4 hrs PRN at home, which he says is effective. Discussed use of liquid morphine for dyspnea/increased work of breathing. All in agreement. Hospice information and contact numbers left with [name] and  patient's sister [name]. Per staff RN Romie Minus, plan is to transition Mr. Pickup to nasal cannula. Possible discharge home tomorrow. Patient information faxed to referral. Signed DNR in place in hospital chart. Thank you. Flo Shanks RN, BSN, [facility] [place], hospital liaison [phone] c

## 2016-09-15 NOTE — Progress Notes (Addendum)
[place] [facility] [place] NAME: Stuart Spence    MR#:  [id number]  DATE OF BIRTH:  [birth year]  SUBJECTIVE:   Feels less SOB this am Considering hospice care  Currently on HF O2  REVIEW OF SYSTEMS:    Review of Systems  Constitutional: Negative for fever, chills weight loss HENT: Negative for ear pain, nosebleeds, congestion, facial swelling, rhinorrhea, neck pain, neck stiffness and ear discharge.   Respiratory: Negative for cough, shortness of breath, wheezing 9improved) Cardiovascular: Negative for chest pain, palpitations and leg swelling.  Gastrointestinal: Negative for heartburn, abdominal pain, vomiting, diarrhea or consitpation Genitourinary: Negative for dysuria, urgency, frequency, hematuria Musculoskeletal: Negative for back pain or joint pain Neurological: Negative for dizziness, seizures, syncope, focal weakness,  numbness and headaches.  Hematological: Does not bruise/bleed easily.  Psychiatric/Behavioral: Negative for hallucinations, confusion, dysphoric mood    Tolerating Diet: yes      DRUG ALLERGIES:  No Known Allergies  VITALS:  Blood pressure 137/84, pulse 93, temperature 97 F (36.1 C), temperature source Oral, resp. rate (!) 24, height 5\' 6"  (1.676 m), weight 96.1 kg (211 lb 13.8 oz), SpO2 96 %.  PHYSICAL EXAMINATION:  Constitutional: Appears well-developed and well-nourished. No distress. HENT: Normocephalic. Marland Kitchen Oropharynx is clear and moist.  Eyes: Conjunctivae and EOM are normal. PERRLA, no scleral icterus.  Neck: Normal ROM. Neck supple. No JVD. No tracheal deviation. CVS: RRR, S1/S2 +, no murmurs, no gallops, no carotid bruit.  Pulmonary: Effort and breath sounds normal, no stridor, rhonchi, wheezes, rales.  Abdominal: Soft. BS +,  no distension, tenderness, rebound or guarding.  Musculoskeletal: Normal range of motion. No edema and no tenderness.  Neuro: Alert. CN 2-12 grossly intact. No focal  deficits. Skin: Skin is warm and dry. No rash noted. Psychiatric: Normal mood and affect.      LABORATORY PANEL:   CBC  Recent Labs Lab 09/15/16 0652  WBC 29.9*  HGB 12.4*  HCT 39.1*  PLT 120*   ------------------------------------------------------------------------------------------------------------------  Chemistries   Recent Labs Lab 09/14/16 0935  NA 135  K 5.0  CL 98*  CO2 26  GLUCOSE 116*  BUN 43*  CREATININE 1.69*  CALCIUM 9.0  AST 36  ALT 48  ALKPHOS 235*  BILITOT 0.6   ------------------------------------------------------------------------------------------------------------------  Cardiac Enzymes  Recent Labs Lab 09/14/16 1241 09/14/16 1834 09/15/16 0652  TROPONINI 0.27* 0.16* 0.09*   ------------------------------------------------------------------------------------------------------------------  RADIOLOGY:  Ct Angio Chest Pe W Or Wo Contrast  Result Date: 09/14/2016 CLINICAL DATA:  Pt to ed via acems from home with reports of shortness of breath this am. Pt was to have his third radiation tx today for lung cancer with mets to the brain.PT stated he use to take chemo for treatment EXAM: CT ANGIOGRAPHY CHEST WITH CONTRAST TECHNIQUE: Multidetector CT imaging of the chest was performed using the standard protocol during bolus administration of intravenous contrast. Multiplanar CT image reconstructions and MIPs were obtained to evaluate the vascular anatomy. CONTRAST:  60 mL of Isovue 370 intravenous contrast COMPARISON:  Current chest radiograph.  PET-CT, 07/08/2016. FINDINGS: Cardiovascular: There is satisfactory opacification of the pulmonary arteries. All There is a saddle embolus in the distal left pulmonary artery with acute pulmonary emboli extending into the segmental branches to the left upper lobe, including the lingula, and left lower lobe. There are no convincing right-sided pulmonary emboli. Right upper lobe pulmonary artery is are  narrowed by surrounding soft tissue. RV/LV ratio is 1.0. The great vessels are normal in  caliber. No significant thoracic aortic atherosclerosis. Mediastinum/Nodes: No mediastinal masses. No left hilar mass or adenopathy. Mildly enlarged right subcarinal lymph node measuring 16 mm in short axis. There is abnormal soft tissue surrounding the right hilar structures most evident superiorly. This is contiguous with a poorly defined medial right upper lobe mass, which has increased in size when compared to the prior PET-CT. Lungs/Pleura: Medial, pleural based, right upper lobe irregular mass currently measures approximately 4.1 x 3.6 cm transversely, previously approximately 3.0 x 2.5 cm. There is irregular nodular interstitial thickening throughout the aerated right lung with multiple patchy areas of nodular and more ill-defined confluent opacity. There is near complete opacification of the right lower lobe and a significant portion of the right middle lobe, likely postobstructive change and/or atelectasis. There is still a minimal right pleural effusion. In the left lung, there multiple areas of ground-glass and confluent type focal opacity. The largest and most discrete of these is in the left upper lobe lingula with posteriorly measuring 2.6 x 1.7 cm. Although these findings may be metastatic disease, areas of atelectasis and/or pulmonary infarction from pulmonary emboli is suspected. No evidence of pulmonary edema. No left pleural effusion. No pneumothorax. Upper Abdomen: No acute findings. No visualize liver or adrenal masses. Partly imaged probable gallstone. Musculoskeletal: Multiple sclerotic bone lesions without significant change from the prior PET-CT. Review of the MIP images confirms the above findings. IMPRESSION: 1. Significant acute pulmonary emboli noted involving the left upper and lower lobes, all segments. Positive for acute PE with CT evidence of right heart strain (RV/LV Ratio = 1.0) which is  borderline for at least submassive (intermediate risk) PE. The presence of right heart strain has been associated with an increased risk of morbidity and mortality. Please activate Code PE by paging [phone]. 2. There are new areas of patchy and masslike ground-glass and more confluent left lung opacity suggesting pulmonary infarction. A component of metastatic disease or pneumonia is not excluded. 3. Worsened metastatic disease in the right lung. The mass extending along the medial right upper lobe from the right hilum is larger. There are multiple ill-defined additional areas of lung opacity on the right there are likely metastatic lesions, and there is reticulonodular irregular interstitial thickening consistent with lymphangitic spread. Confluent opacity throughout most of the right lower lobe and right upper lobe is likely predominantly atelectasis, given the presence of volume loss on the right. This has also increased. Small right pleural effusion. Critical Value/emergent results were called by telephone at the time of interpretation on 09/14/2016 at 11:27 am to Dr. Lavonia Drafts , who verbally acknowledged these results. Electronically Signed   By: Lajean Manes M.D.   On: 09/14/2016 11:32   Dg Chest Port 1 View  Result Date: 09/14/2016 CLINICAL DATA:  shortness of breath this am. 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 EXAM: PORTABLE CHEST 1 VIEW COMPARISON:  PET-CT, 07/08/2016.  Chest radiograph, 06/23/2016. FINDINGS: Since the prior exam, increased opacity has developed on the right. There is airspace type opacity in the right mid to upper lung, predominating centrally. There is more confluent opacity at the right lung base in part likely due to a moderate pleural effusion. Left lung is hyperexpanded with mild increased perihilar vascularity, but otherwise clear. Cardiac silhouette is normal in size. Right hilum and mediastinum are silhouetted by the  right lung opacity. No left hilar mass or convincing adenopathy. Skeletal structures are grossly intact. Osteoblastic lesions  noted on the prior PET-CT are not well-defined. IMPRESSION: 1. Worsened lung aeration on the right when compared to the prior exams. This is due to a combination of pleural fluid, moderate-sized, and increased from the prior study, as well as increased parenchymal opacity. The parenchymal opacity may be postobstructive consolidation and/or atelectasis, metastatic disease or pneumonia or a combination. Electronically Signed   By: Lajean Manes M.D.   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  1. Acute hypoxic respiratory failure due to healthcare associated pneumonia as well as pulmonary emboli Patient currently on high flow oxygen Continue cefepime and Eliquis Wean steroids  2. HCAP:Continue Cefepime  3. Stage IV adenocarcinoma of the lung with metastatic disease: Patient evaluated by oncology. No further treatment options remain as far as chemotherapy. Recommendations for hospice/palliative care.  4. Pulmonary emboli: Continue anticoagulation 5. Elevated troponin due to demand ischemia. Patient has been ruled out for non-ST elevation MI.  Management plans discussed with the patient and he is in agreement.  CODE STATUS: DNR  TOTAL TIME TAKING CARE OF THIS PATIENT: 30 minutes.     POSSIBLE D/C ?, DEPENDING ON CLINICAL CONDITION.   Hurshel Bouillon M.D on 09/15/2016 at 10:38 AM  Between 7am to 6pm - Pager - [phone] After 6pm go to [email or web address] - password [email or web address] [facility]  Office  [phone]  CC: Primary care physician; Guadalupe Maple, MD  Note: This dictation was prepared with Dragon dictation along with smaller phrase technology. Any  transcriptional errors that result from this process are unintentional.

## 2016-09-15 NOTE — Care Management (Signed)
RNCM notified by palliative nurse and patient's wife and patient that they would like to return home followed by [facility] hospice. I have notified Santiago Glad with [facility] hospice. RNCM will follow along. Patient is requesting a shower chair however "there may be one in storage".

## 2016-09-16 ENCOUNTER — Ambulatory Visit: Payer: PRIVATE HEALTH INSURANCE

## 2016-09-16 MED ORDER — APIXABAN 5 MG PO TABS: 5.0000 mg | ORAL_TABLET | Freq: Two times a day (BID) | ORAL | 0 refills | Status: AC

## 2016-09-16 MED ORDER — PREDNISONE 10 MG PO TABS: 50.0000 mg | ORAL_TABLET | Freq: Every day | ORAL | 0 refills | Status: AC

## 2016-09-16 MED ORDER — MORPHINE SULFATE (CONCENTRATE) 10 MG/0.5ML PO SOLN: 5.0000 mg | ORAL | 0 refills | Status: AC | PRN

## 2016-09-16 MED ORDER — OXYCODONE HCL 10 MG PO TABS: 10.0000 mg | ORAL_TABLET | ORAL | 0 refills | Status: AC | PRN

## 2016-09-16 MED ORDER — LEVOFLOXACIN 750 MG PO TABS: 750.0000 mg | ORAL_TABLET | Freq: Every day | ORAL | 0 refills | Status: AC

## 2016-09-16 MED ORDER — APIXABAN 5 MG PO TABS: 10.0000 mg | ORAL_TABLET | Freq: Two times a day (BID) | ORAL | 0 refills | Status: AC

## 2016-09-16 NOTE — Progress Notes (Signed)
Received Md order to discharge patient to home with Hospice, reviewed discharge instructions home meds, prescriptions and follow up appointments with patient and patient verbalized understanding, discharge via EMS

## 2016-09-16 NOTE — Discharge Summary (Signed)
[facility] NAME: Stuart Spence    MR#:  [id number]  DATE OF BIRTH:  06-01-1960  DATE OF ADMISSION:  09/14/2016 ADMITTING PHYSICIAN: Nicholes Mango, MD  DATE OF DISCHARGE: 09/16/2016  PRIMARY CARE PHYSICIAN: Guadalupe Maple, MD    ADMISSION DIAGNOSIS:  Shortness of breath [R06.02] SOB (shortness of breath) [R06.02] Pleural effusion [J90] Other acute pulmonary embolism with acute cor pulmonale (HCC) [I26.09]  DISCHARGE DIAGNOSIS:  Active Problems:   Acute respiratory failure with hypoxia ([place])   Palliative care by specialist   DNR (do not resuscitate)   Pain, cancer   SOB (shortness of breath)   SECONDARY DIAGNOSIS:   Past Medical History:  Diagnosis Date  . Cancer ([place])    Lung  . Depression   . ED (erectile dysfunction)   . Gout   . Hypertension   . On home oxygen therapy   . 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  1. Acute hypoxic respiratory failure due to healthcare associated pneumonia as well as pulmonary emboli Patient was initially admitted to the critical care unit. He was placed on high flow nasal cannula. He has been weaned off of this to nasal cannula. Due to overall poor prognosis patient and family met with palliative care and hospice team. Patient will be discharged with hospice.  2. HCAP:  3. Stage IV adenocarcinoma of the lung with metastatic disease: Patient evaluated by oncology. No further treatment options remain as far as chemotherapy. Recommendations were for for hospice/palliative care.  4. Pulmonary emboli: He may continue anticoagulation 5. Elevated troponin due to demand ischemia. Patient has been ruled out for non-ST elevation MI.    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 apixaban (ELIQUIS) 5 MG TABS tablet Take 2 tablets (10 mg total) by mouth 2 (two) times daily. Qty: 24 tablet, Refills: 0    !! apixaban (ELIQUIS) 5 MG TABS tablet Take 1 tablet (5 mg total) by mouth 2 (two) times daily. Qty: 60 tablet, Refills: 0    levofloxacin (LEVAQUIN) 750 MG tablet Take 1 tablet (750 mg total) by mouth daily. Qty: 6 tablet, Refills: 0    Morphine Sulfate (MORPHINE CONCENTRATE) 10 MG/0.5ML SOLN concentrated solution Take 0.25 mLs (5 mg total) by mouth every 3 (three) hours as needed for moderate pain or shortness of breath. Qty: 30 mL, Refills: 0    predniSONE (DELTASONE) 10 MG tablet Take 5 tablets (50 mg total) by mouth daily with breakfast. Qty: 20 tablet, Refills: 0     !! - Potential duplicate medications found. Please discuss with provider.    CONTINUE these medications which have CHANGED   Details  oxyCODONE 10 MG TABS Take 1 tablet (10 mg total) by mouth every 4 (four) hours as needed for moderate pain or severe pain. Qty: 30 tablet, Refills: 0      CONTINUE these medications which have NOT CHANGED   Details  albuterol (PROVENTIL HFA;VENTOLIN HFA) 108 (90 Base) MCG/ACT inhaler  Inhale 1-2 puffs into the lungs every 6 (six) hours as needed for wheezing or shortness of breath. Reported on 09/12/2015    FLUoxetine (PROZAC) 20 MG capsule Take 2 capsules (40 mg total) by mouth every morning. Qty: 60 capsule, Refills: 12   Associated Diagnoses: Depression, unspecified depression type    LORazepam (ATIVAN) 0.5 MG tablet Take 1 tablet (0.5 mg total) by mouth every 6 (six) hours as needed. For nausea Qty: 20 tablet, Refills: 0   Associated Diagnoses: Adenocarcinoma of  right lung ([place]); Bone metastasis ([place]); Counseling regarding goals of care      STOP taking these medications     amLODipine (NORVASC) 5 MG tablet      benazepril (LOTENSIN) 40 MG tablet      Calcium Carbonate (CALCIUM 600 PO)      colchicine 0.6 MG tablet      dexamethasone (DECADRON) 4 MG tablet      lidocaine (XYLOCAINE) 2 % solution      nystatin (NYSTATIN) powder      ondansetron (ZOFRAN) 8 MG tablet      pantoprazole (PROTONIX) 40 MG tablet      sildenafil (VIAGRA) 100 MG tablet      dexamethasone (DECADRON) 4 MG tablet      lidocaine-prilocaine (EMLA) cream           Today   CHIEF COMPLAINT:   patient will Be transitioned to hospice   VITAL SIGNS:  Blood pressure (!) 157/79, pulse 94, temperature 98.6 F (37 C), resp. rate 20, height 5' 6"  (1.676 m), weight 96.1 kg (211 lb 13.8 oz), SpO2 93 %.   REVIEW OF SYSTEMS:  Review of Systems  Constitutional: Positive for malaise/fatigue. Negative for chills and fever.  HENT: Negative.  Negative for ear discharge, ear pain, hearing loss, nosebleeds and sore throat.   Eyes: Negative.  Negative for blurred vision and pain.  Respiratory: Positive for cough and shortness of breath. Negative for hemoptysis and wheezing.   Cardiovascular: Negative.  Negative for chest pain, palpitations and leg swelling.  Gastrointestinal: Negative.  Negative for abdominal pain, blood in stool, diarrhea, nausea and vomiting.  Genitourinary: Negative.  Negative for dysuria.  Musculoskeletal: Negative.  Negative for back pain.  Skin: Negative.   Neurological: Positive for weakness. Negative for dizziness, tremors, speech change, focal weakness, seizures and headaches.  Endo/Heme/Allergies: Negative.  Does not bruise/bleed easily.  Psychiatric/Behavioral: Negative.  Negative for depression, hallucinations and suicidal ideas.     PHYSICAL EXAMINATION:  GENERAL:  56 y.o.-year-old patient lying in the bed with no acute distress.   NECK:  Supple, no jugular venous distention. No thyroid enlargement, no tenderness.  LUNGS: Decreased breath sounds throughout CARDIOVASCULAR: S1, S2 normal. No murmurs, rubs, or gallops.  ABDOMEN: Soft, non-tender, non-distended. Bowel sounds present. No organomegaly or mass.  EXTREMITIES: No pedal edema, cyanosis, or clubbing.  PSYCHIATRIC: The patient is alert and oriented x 3.  SKIN: No obvious rash, lesion, or ulcer.   DATA REVIEW:   CBC  Recent Labs Lab 09/15/16 0652  WBC 29.9*  HGB 12.4*  HCT 39.1*  PLT 120*    Chemistries   Recent Labs Lab 09/14/16 0935  NA 135  K 5.0  CL 98*  CO2 26  GLUCOSE 116*  BUN 43*  CREATININE 1.69*  CALCIUM 9.0  AST 36  ALT 48  ALKPHOS 235*  BILITOT 0.6    Cardiac Enzymes  Recent Labs Lab 09/14/16 1241 09/14/16 1834 09/15/16 0652  TROPONINI 0.27* 0.16* 0.09*  Microbiology Results  @[email or web address] @  RADIOLOGY:  Ct Angio Chest Pe W Or Wo Contrast  Result Date: 09/14/2016 CLINICAL DATA:  Pt to ed via acems from home with reports of shortness of breath this am. Pt was to have his third radiation tx today for lung cancer with mets to the brain.PT stated he use to take chemo for treatment EXAM: CT ANGIOGRAPHY CHEST WITH CONTRAST TECHNIQUE: Multidetector CT imaging of the chest was performed using the standard protocol during bolus administration of intravenous contrast. Multiplanar CT image reconstructions and MIPs were obtained to evaluate the vascular anatomy. CONTRAST:  60 mL of Isovue 370 intravenous contrast COMPARISON:  Current chest radiograph.  PET-CT, 07/08/2016. FINDINGS: Cardiovascular: There is satisfactory opacification of the pulmonary arteries. All There is a saddle embolus in the distal left pulmonary artery with acute pulmonary emboli extending into the segmental branches to the left upper lobe, including the lingula, and left lower lobe. There are no convincing right-sided pulmonary emboli. Right upper lobe  pulmonary artery is are narrowed by surrounding soft tissue. RV/LV ratio is 1.0. The great vessels are normal in caliber. No significant thoracic aortic atherosclerosis. Mediastinum/Nodes: No mediastinal masses. No left hilar mass or adenopathy. Mildly enlarged right subcarinal lymph node measuring 16 mm in short axis. There is abnormal soft tissue surrounding the right hilar structures most evident superiorly. This is contiguous with a poorly defined medial right upper lobe mass, which has increased in size when compared to the prior PET-CT. Lungs/Pleura: Medial, pleural based, right upper lobe irregular mass currently measures approximately 4.1 x 3.6 cm transversely, previously approximately 3.0 x 2.5 cm. There is irregular nodular interstitial thickening throughout the aerated right lung with multiple patchy areas of nodular and more ill-defined confluent opacity. There is near complete opacification of the right lower lobe and a significant portion of the right middle lobe, likely postobstructive change and/or atelectasis. There is still a minimal right pleural effusion. In the left lung, there multiple areas of ground-glass and confluent type focal opacity. The largest and most discrete of these is in the left upper lobe lingula with posteriorly measuring 2.6 x 1.7 cm. Although these findings may be metastatic disease, areas of atelectasis and/or pulmonary infarction from pulmonary emboli is suspected. No evidence of pulmonary edema. No left pleural effusion. No pneumothorax. Upper Abdomen: No acute findings. No visualize liver or adrenal masses. Partly imaged probable gallstone. Musculoskeletal: Multiple sclerotic bone lesions without significant change from the prior PET-CT. Review of the MIP images confirms the above findings. IMPRESSION: 1. Significant acute pulmonary emboli noted involving the left upper and lower lobes, all segments. Positive for acute PE with CT evidence of right heart strain (RV/LV  Ratio = 1.0) which is borderline for at least submassive (intermediate risk) PE. The presence of right heart strain has been associated with an increased risk of morbidity and mortality. Please activate Code PE by paging [phone]. 2. There are new areas of patchy and masslike ground-glass and more confluent left lung opacity suggesting pulmonary infarction. A component of metastatic disease or pneumonia is not excluded. 3. Worsened metastatic disease in the right lung. The mass extending along the medial right upper lobe from the right hilum is larger. There are multiple ill-defined additional areas of lung opacity on the right there are likely metastatic lesions, and there is reticulonodular irregular interstitial thickening consistent with lymphangitic spread. Confluent opacity throughout most of the right lower lobe and right upper lobe is likely predominantly atelectasis, given the presence of volume loss  on the right. This has also increased. Small right pleural effusion. Critical Value/emergent results were called by telephone at the time of interpretation on 09/14/2016 at 11:27 am to Dr. Lavonia Drafts , who verbally acknowledged these results. Electronically Signed   By: Lajean Manes M.D.   On: 09/14/2016 11:32   Dg Chest Port 1 View  Result Date: 09/14/2016 CLINICAL DATA:  shortness of breath this am. 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 EXAM: PORTABLE CHEST 1 VIEW COMPARISON:  PET-CT, 07/08/2016.  Chest radiograph, 06/23/2016. FINDINGS: Since the prior exam, increased opacity has developed on the right. There is airspace type opacity in the right mid to upper lung, predominating centrally. There is more confluent opacity at the right lung base in part likely due to a moderate pleural effusion. Left lung is hyperexpanded with mild increased perihilar vascularity, but otherwise clear. Cardiac silhouette is normal in size. Right hilum and mediastinum are  silhouetted by the right lung opacity. No left hilar mass or convincing adenopathy. Skeletal structures are grossly intact. Osteoblastic lesions noted on the prior PET-CT are not well-defined. IMPRESSION: 1. Worsened lung aeration on the right when compared to the prior exams. This is due to a combination of pleural fluid, moderate-sized, and increased from the prior study, as well as increased parenchymal opacity. The parenchymal opacity may be postobstructive consolidation and/or atelectasis, metastatic disease or pneumonia or a combination. Electronically Signed   By: Lajean Manes M.D.   On: 09/14/2016 10:03      Current Discharge Medication List    START taking these medications   Details  !! apixaban (ELIQUIS) 5 MG TABS tablet Take 2 tablets (10 mg total) by mouth 2 (two) times daily. Qty: 24 tablet, Refills: 0    !! apixaban (ELIQUIS) 5 MG TABS tablet Take 1 tablet (5 mg total) by mouth 2 (two) times daily. Qty: 60 tablet, Refills: 0    levofloxacin (LEVAQUIN) 750 MG tablet Take 1 tablet (750 mg total) by mouth daily. Qty: 6 tablet, Refills: 0    Morphine Sulfate (MORPHINE CONCENTRATE) 10 MG/0.5ML SOLN concentrated solution Take 0.25 mLs (5 mg total) by mouth every 3 (three) hours as needed for moderate pain or shortness of breath. Qty: 30 mL, Refills: 0    predniSONE (DELTASONE) 10 MG tablet Take 5 tablets (50 mg total) by mouth daily with breakfast. Qty: 20 tablet, Refills: 0     !! - Potential duplicate medications found. Please discuss with provider.    CONTINUE these medications which have CHANGED   Details  oxyCODONE 10 MG TABS Take 1 tablet (10 mg total) by mouth every 4 (four) hours as needed for moderate pain or severe pain. Qty: 30 tablet, Refills: 0      CONTINUE these medications which have NOT CHANGED   Details  albuterol (PROVENTIL HFA;VENTOLIN HFA) 108 (90 Base) MCG/ACT inhaler Inhale 1-2 puffs into the lungs every 6 (six) hours as needed for wheezing or  shortness of breath. Reported on 09/12/2015    FLUoxetine (PROZAC) 20 MG capsule Take 2 capsules (40 mg total) by mouth every morning. Qty: 60 capsule, Refills: 12   Associated Diagnoses: Depression, unspecified depression type    LORazepam (ATIVAN) 0.5 MG tablet Take 1 tablet (0.5 mg total) by mouth every 6 (six) hours as needed. For nausea Qty: 20 tablet, Refills: 0   Associated Diagnoses: Adenocarcinoma of right lung ([place]); Bone metastasis ([place]); Counseling regarding goals of care  STOP taking these medications     amLODipine (NORVASC) 5 MG tablet      benazepril (LOTENSIN) 40 MG tablet      Calcium Carbonate (CALCIUM 600 PO)      colchicine 0.6 MG tablet      dexamethasone (DECADRON) 4 MG tablet      lidocaine (XYLOCAINE) 2 % solution      nystatin (NYSTATIN) powder      ondansetron (ZOFRAN) 8 MG tablet      pantoprazole (PROTONIX) 40 MG tablet      sildenafil (VIAGRA) 100 MG tablet      dexamethasone (DECADRON) 4 MG tablet      lidocaine-prilocaine (EMLA) cream             Management plans discussed with the patient and family and they are in agreement. Stable for discharge   Patient should follow up with hospice  CODE STATUS:     Code Status Orders        Start     Ordered   09/14/16 1311  Do not attempt resuscitation (DNR)  Continuous    Question Answer Comment  In the event of cardiac or respiratory ARREST Do not call a "code blue"   In the event of cardiac or respiratory ARREST Do not perform Intubation, CPR, defibrillation or ACLS   In the event of cardiac or respiratory ARREST Use medication by any route, position, wound care, and other measures to relive pain and suffering. May use oxygen, suction and manual treatment of airway obstruction as needed for comfort.   Comments RN may pronounce      09/14/16 1311    Code Status History    Date Active Date Inactive Code Status Order ID Comments User Context   07/24/2016  8:38 PM 07/27/2016  4:54  PM DNR [id number]  Demetrios Loll, MD Inpatient   06/23/2016  8:51 PM 06/25/2016  5:29 PM Full Code [id number]  Henreitta Leber, MD Inpatient   08/10/2015  5:01 PM 08/17/2015  4:50 PM Full Code [id number]  Hower, Aaron Mose, MD ED    Advance Directive Documentation     Most Recent Value  Type of Advance Directive  Living will  Pre-existing out of facility DNR order (yellow form or pink MOST form)  -  "MOST" Form in Place?  -      TOTAL TIME TAKING CARE OF THIS PATIENT: 37 minutes.    Note: This dictation was prepared with Dragon dictation along with smaller phrase technology. Any transcriptional errors that result from this process are unintentional.  Bradan Congrove M.D on 09/16/2016 at 7:27 AM  Between 7am to 6pm - Pager - [phone] After 6pm go to [email or web address] - password [email or web address] [facility]  Office  [phone]  CC: Primary care physician; Guadalupe Maple, MD

## 2016-09-16 NOTE — Care Management (Signed)
Discharge to home today per Dr. Benjie Karvonen. Will be followed by [facility]. Spoke with Aunt and Mr Edison at the bedside. States he doesn't feel he needs a hospital bed yet, family is bring rolling walker and cane, does have a shower chair in the home. States that his wife can transport to home today. Discussed rescue unit, if needed. Shelbie Ammons RN MSN CCM Care Management [phone]

## 2016-09-16 NOTE — Progress Notes (Signed)
Follow up visit to new referral for [facility] at home. Patient seen sitting up in bed, remains on 6 liters of oxygen. He reports having a good night, did receive pain medication this morning for his back pain. Wife [name], Rosary Lively and pastor present. Writer explained that the oxygen has been ordered for delivery to the home today, [name] voiced understanding and will return to the house to receive the oxygen. Discussed discharge transportation and at this time due to patient's increased oxygen requirement patient and family are agreeable to EMS transport. CMRN Hassan Rowan and staff RN Gerald Stabs aware.Will continue to follow through discharge. Signed DNR in place in discharge packet. Thank you. Flo Shanks RN, BSN, [facility], [facility] [phone] c

## 2016-09-17 ENCOUNTER — Telehealth: Payer: Self-pay | Admitting: *Deleted

## 2016-09-17 ENCOUNTER — Ambulatory Visit: Payer: PRIVATE HEALTH INSURANCE

## 2016-09-19 LAB — CULTURE, BLOOD (ROUTINE X 2)
CULTURE: NO GROWTH
CULTURE: NO GROWTH
SPECIAL REQUESTS: ADEQUATE
Special Requests: ADEQUATE

## 2016-09-20 ENCOUNTER — Ambulatory Visit: Payer: PRIVATE HEALTH INSURANCE

## 2016-09-21 ENCOUNTER — Ambulatory Visit: Payer: PRIVATE HEALTH INSURANCE

## 2016-09-22 ENCOUNTER — Ambulatory Visit: Payer: PRIVATE HEALTH INSURANCE

## 2016-09-23 ENCOUNTER — Ambulatory Visit: Payer: PRIVATE HEALTH INSURANCE

## 2016-09-24 ENCOUNTER — Ambulatory Visit: Payer: PRIVATE HEALTH INSURANCE

## 2016-09-27 ENCOUNTER — Ambulatory Visit: Payer: PRIVATE HEALTH INSURANCE

## 2016-09-28 ENCOUNTER — Ambulatory Visit: Payer: PRIVATE HEALTH INSURANCE

## 2016-09-29 ENCOUNTER — Ambulatory Visit: Payer: PRIVATE HEALTH INSURANCE

## 2016-09-30 ENCOUNTER — Ambulatory Visit: Payer: PRIVATE HEALTH INSURANCE

## 2016-10-01 ENCOUNTER — Ambulatory Visit: Payer: PRIVATE HEALTH INSURANCE

## 2016-10-03 NOTE — Telephone Encounter (Addendum)
Called to report that patient was opened to services yesterday and passed away 09-26-2016 @ 1250 AM. She needs the signed orders that were faxed over ASAP so she can close his chart

## 2016-10-03 DEATH — deceased

## 2016-10-04 ENCOUNTER — Ambulatory Visit: Payer: PRIVATE HEALTH INSURANCE

## 2016-10-05 ENCOUNTER — Ambulatory Visit: Payer: PRIVATE HEALTH INSURANCE

## 2016-10-05 ENCOUNTER — Other Ambulatory Visit: Payer: No Typology Code available for payment source

## 2016-10-07 ENCOUNTER — Ambulatory Visit: Payer: PRIVATE HEALTH INSURANCE

## 2016-10-08 ENCOUNTER — Ambulatory Visit: Payer: PRIVATE HEALTH INSURANCE

## 2016-10-11 ENCOUNTER — Ambulatory Visit: Payer: PRIVATE HEALTH INSURANCE

## 2016-10-12 ENCOUNTER — Other Ambulatory Visit: Payer: No Typology Code available for payment source

## 2016-10-12 ENCOUNTER — Ambulatory Visit: Payer: PRIVATE HEALTH INSURANCE

## 2016-10-13 ENCOUNTER — Ambulatory Visit: Payer: PRIVATE HEALTH INSURANCE

## 2016-10-14 ENCOUNTER — Ambulatory Visit: Payer: PRIVATE HEALTH INSURANCE

## 2016-10-15 ENCOUNTER — Ambulatory Visit: Payer: PRIVATE HEALTH INSURANCE

## 2016-10-18 ENCOUNTER — Ambulatory Visit: Payer: PRIVATE HEALTH INSURANCE

## 2016-10-19 ENCOUNTER — Ambulatory Visit: Payer: PRIVATE HEALTH INSURANCE

## 2016-10-20 ENCOUNTER — Ambulatory Visit: Payer: PRIVATE HEALTH INSURANCE

## 2016-10-21 ENCOUNTER — Ambulatory Visit: Payer: PRIVATE HEALTH INSURANCE

## 2016-10-22 ENCOUNTER — Ambulatory Visit: Payer: PRIVATE HEALTH INSURANCE

## 2016-10-25 ENCOUNTER — Ambulatory Visit: Payer: PRIVATE HEALTH INSURANCE

## 2016-10-26 ENCOUNTER — Ambulatory Visit: Payer: PRIVATE HEALTH INSURANCE

## 2016-10-27 ENCOUNTER — Ambulatory Visit: Payer: PRIVATE HEALTH INSURANCE

## 2016-10-28 ENCOUNTER — Ambulatory Visit: Payer: PRIVATE HEALTH INSURANCE

## 2016-11-15 ENCOUNTER — Ambulatory Visit: Admit: 2016-11-15 | Payer: No Typology Code available for payment source | Admitting: Unknown Physician Specialty

## 2016-11-15 SURGERY — COLONOSCOPY WITH PROPOFOL
Anesthesia: General

## 2016-11-17 ENCOUNTER — Other Ambulatory Visit: Payer: Self-pay | Admitting: Nurse Practitioner

## 2016-12-29 ENCOUNTER — Ambulatory Visit: Payer: PRIVATE HEALTH INSURANCE | Admitting: Family Medicine

## 2017-11-29 IMAGING — CR DG FEMUR 2+V*R*
4 series · 5 of 5 positions shown · non-contrast
Comparison: PET-CT 08/20/2015

CLINICAL DATA: Recent diagnosis of lung cancer with osseous lesions
on PET-CT.

EXAM:
RIGHT FEMUR 2 VIEWS

[femur ap (1 of 2)]
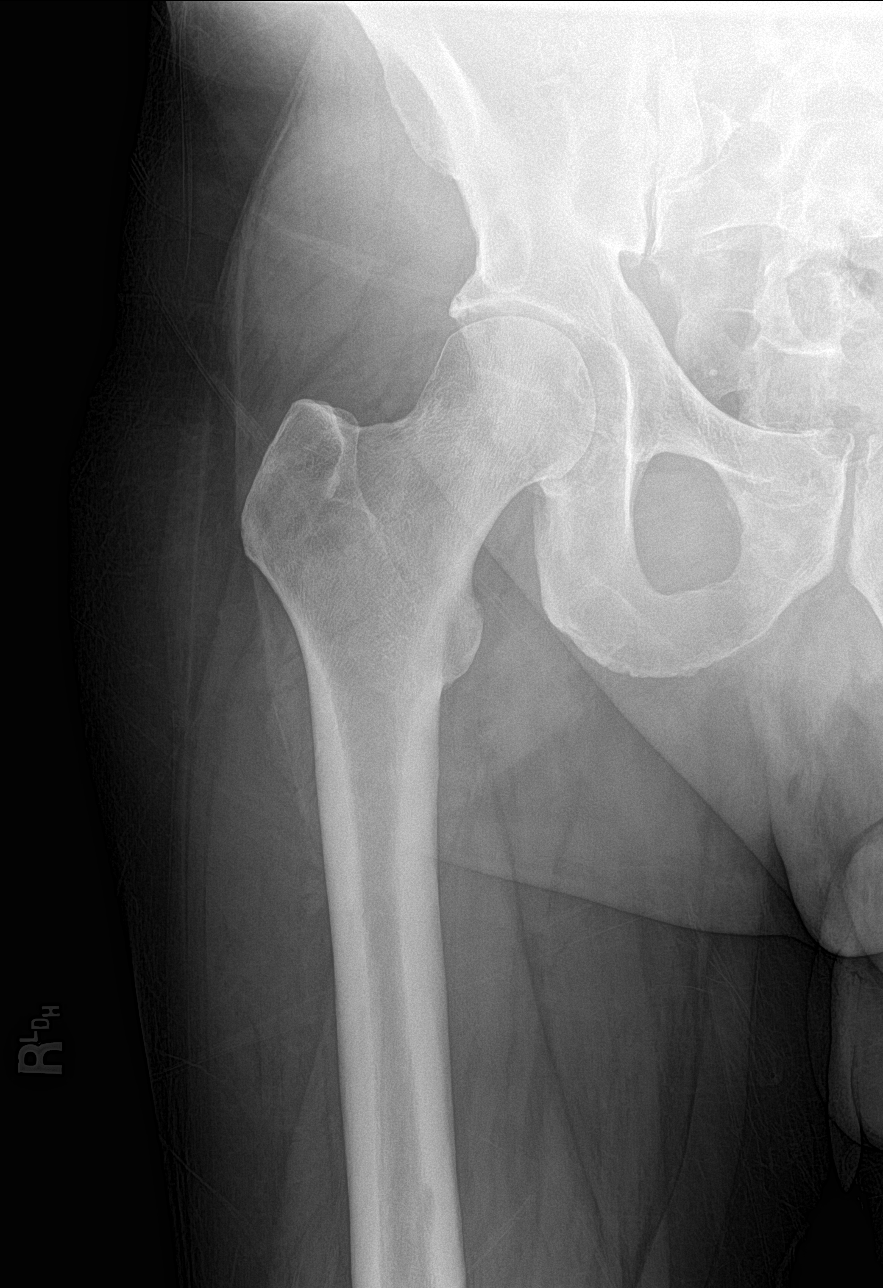

[femur ap (2 of 2)]
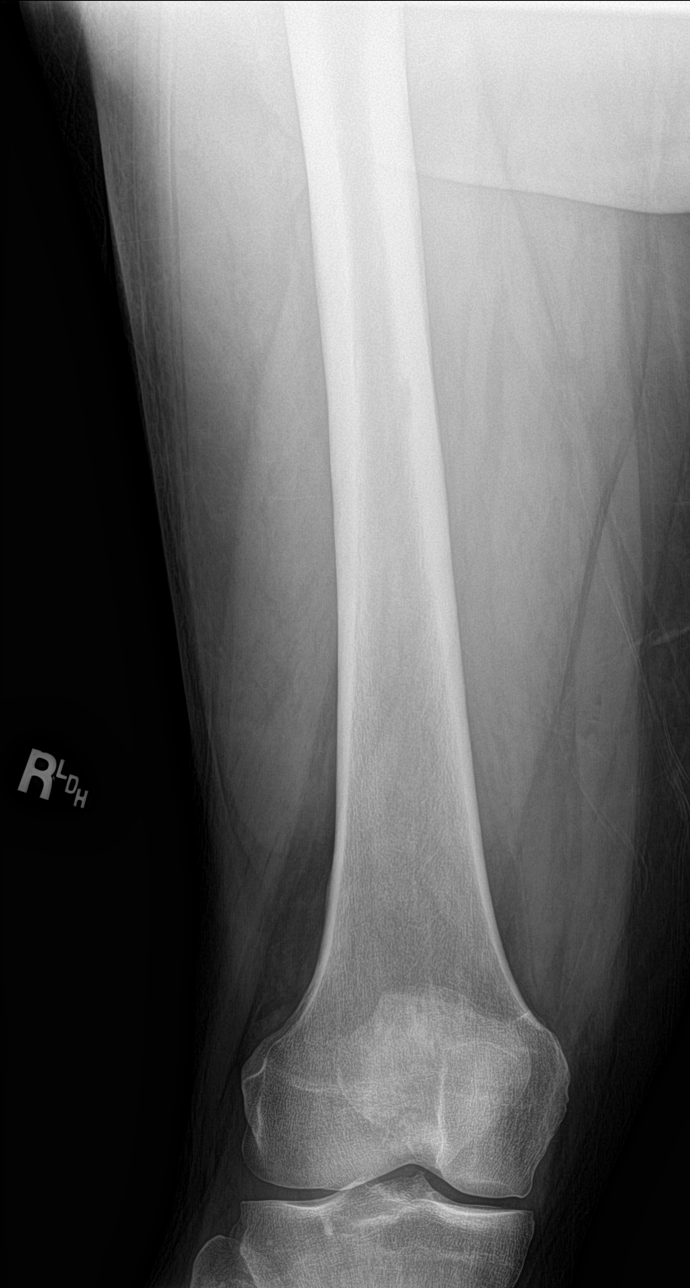

[femur lat (1 of 2)]
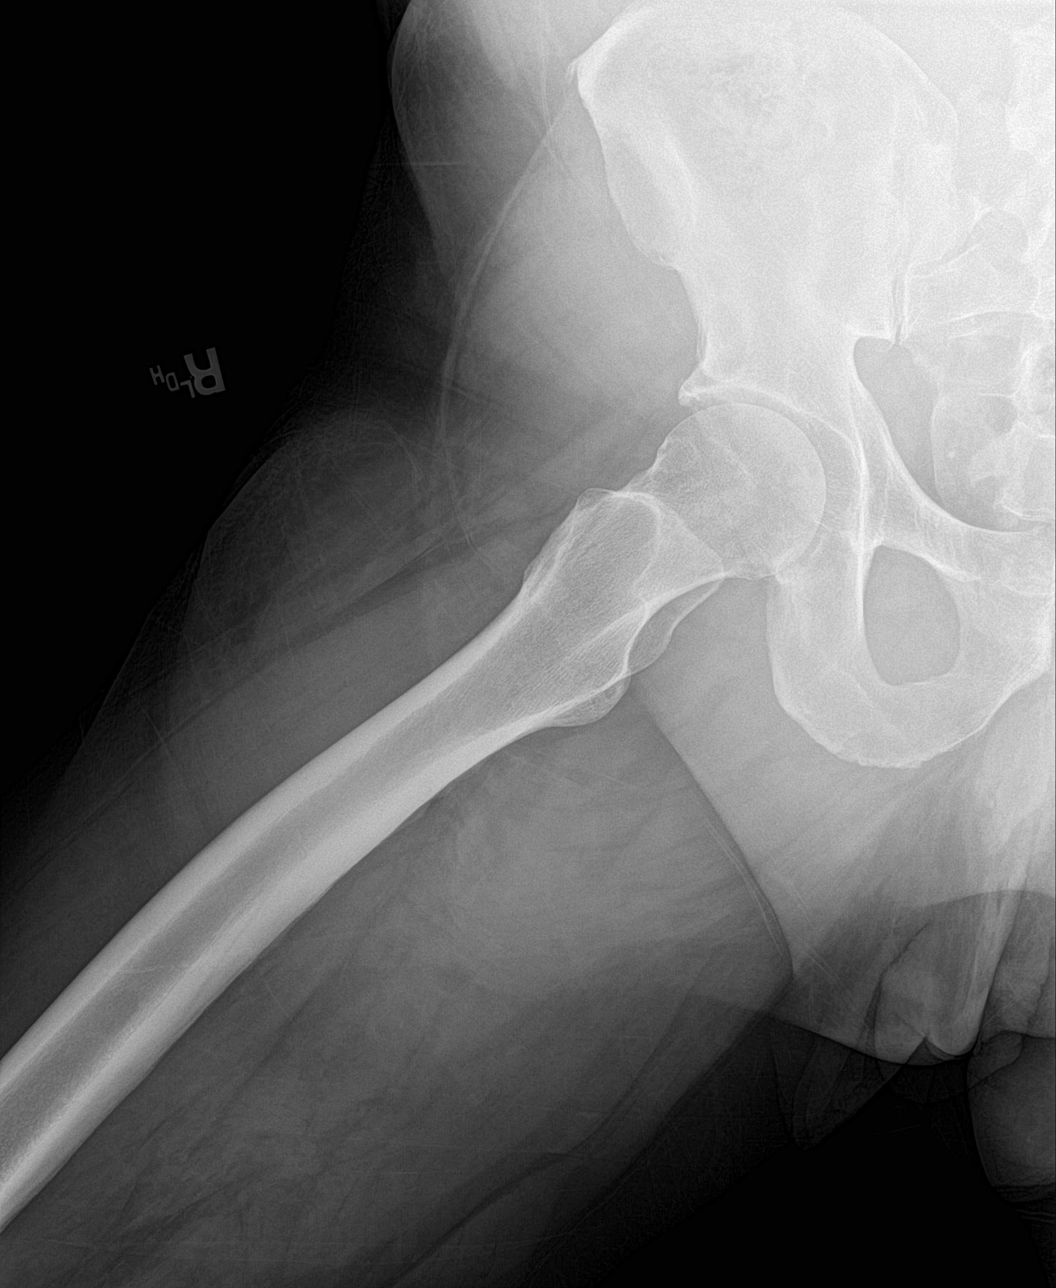

[Series 4: femur lat · 0.14mm/px · 2 of 2 slices shown (2 of 2)]
[im 1/2]
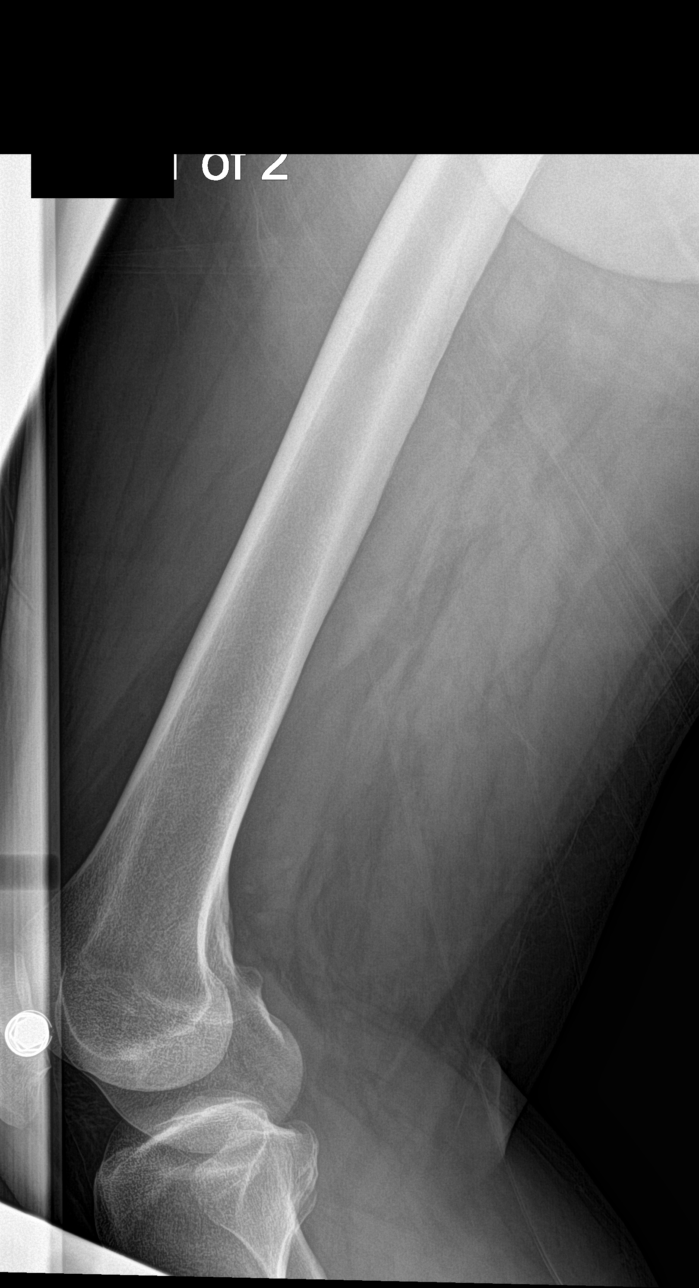
[im 2/2]
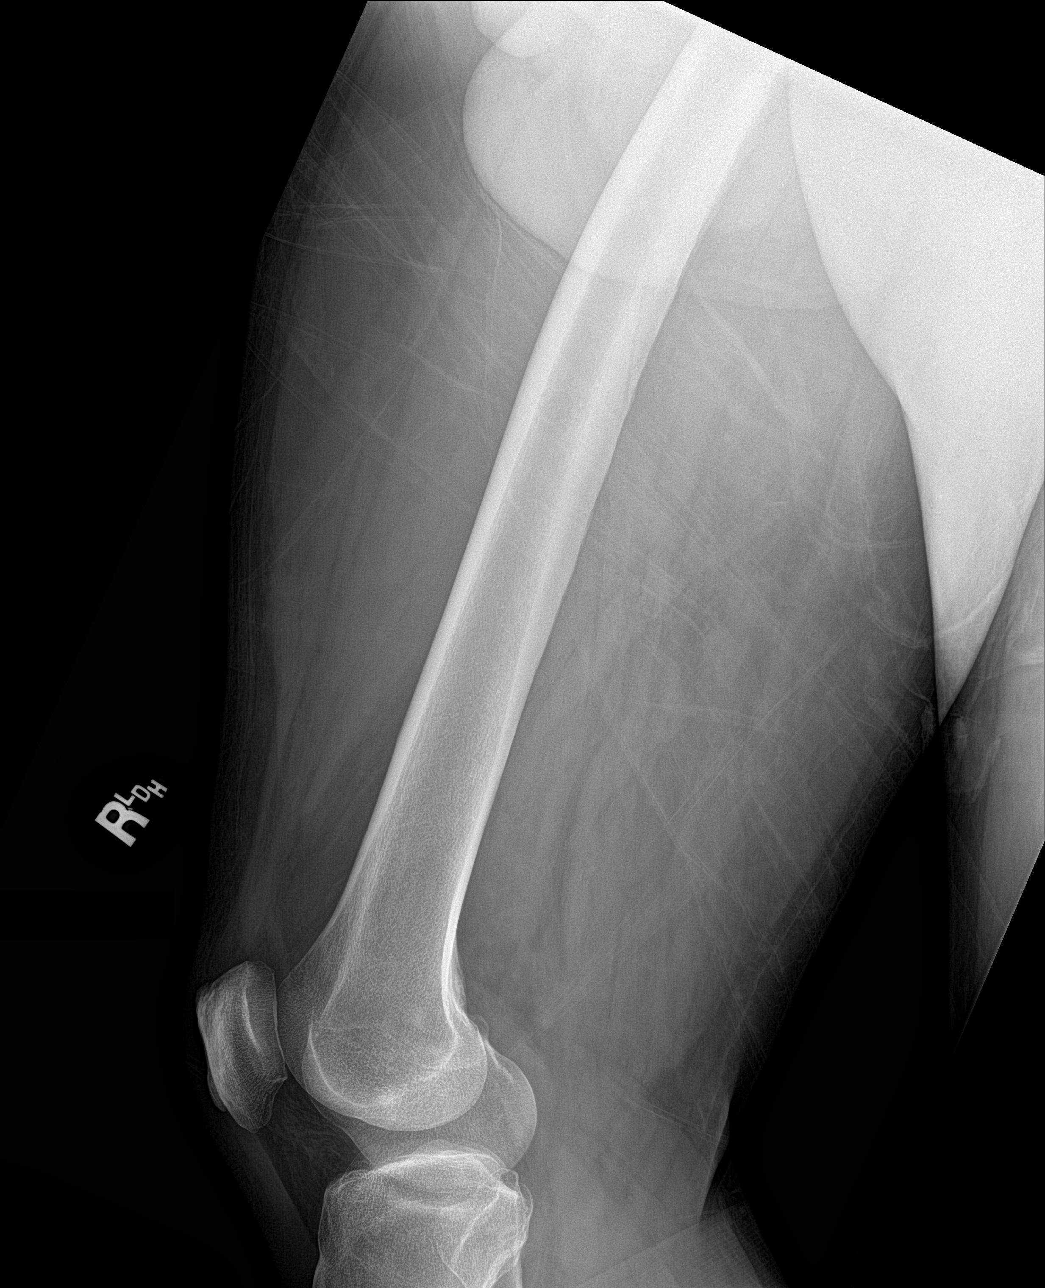

[5 of 5 positions shown; findings below may reference images not displayed]

FINDINGS: There is no evidence of fracture. Osseous mineralization is normal.
There is a 4 mm circumscribed lytic lesion within the lesser
trochanter. There is also cortical irregularity within the medial
right mid femoral cortex, likely representing a subtle lytic focus.
Soft tissues are unremarkable.
IMPRESSION: 4 mm lytic lesion within the lesser trochanter and subtle lytic
abnormality within the medial mid right femoral cortex.

## 2017-11-29 IMAGING — CR DG HUMERUS 2V *L*
2 series · 3 of 3 positions shown · non-contrast
Comparison: PET-CT of 08/20/2015

CLINICAL DATA: Recent diagnosis of lung carcinoma, positive lesions
on PET scan

EXAM:
LEFT HUMERUS - 2+ VIEW

[humerus ap]
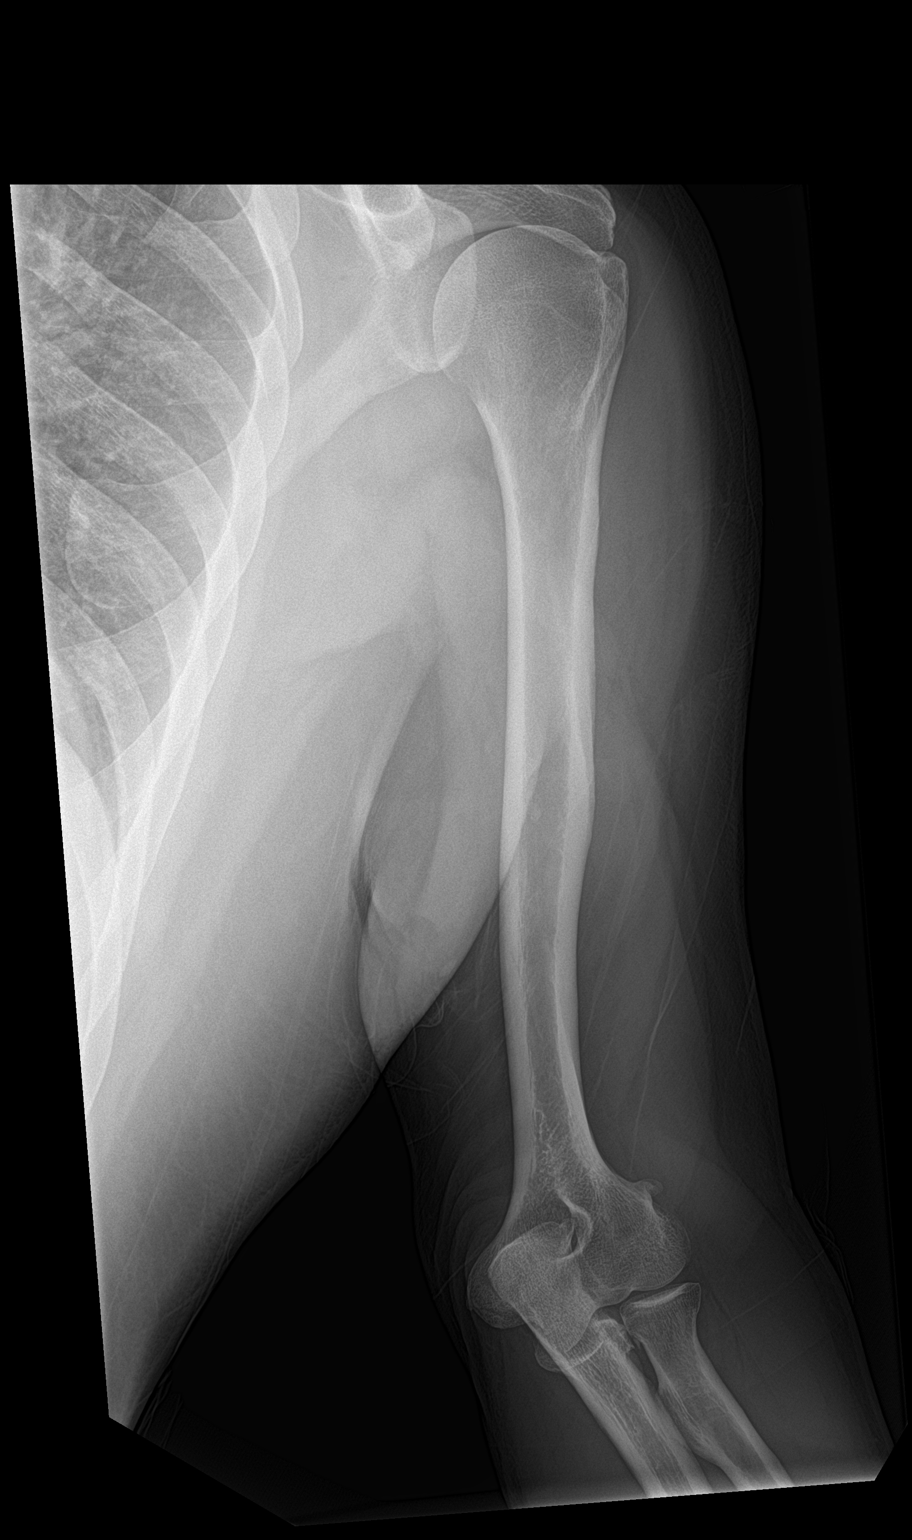

[Series 2: humerus lat · 0.14mm/px · 2 of 2 slices shown]
[im 1/2]
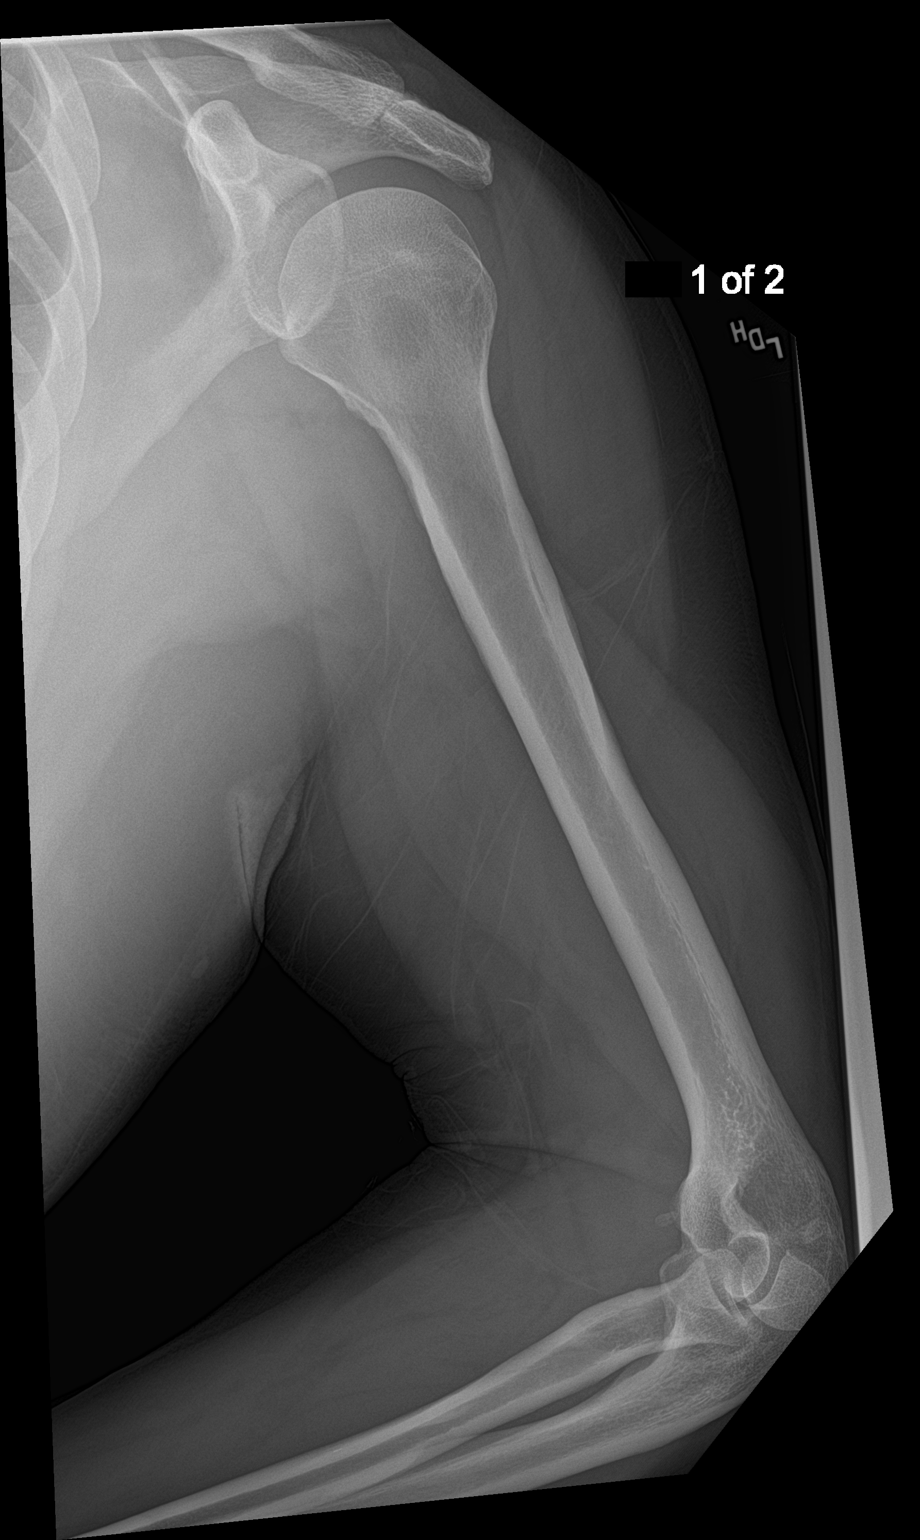
[im 2/2]
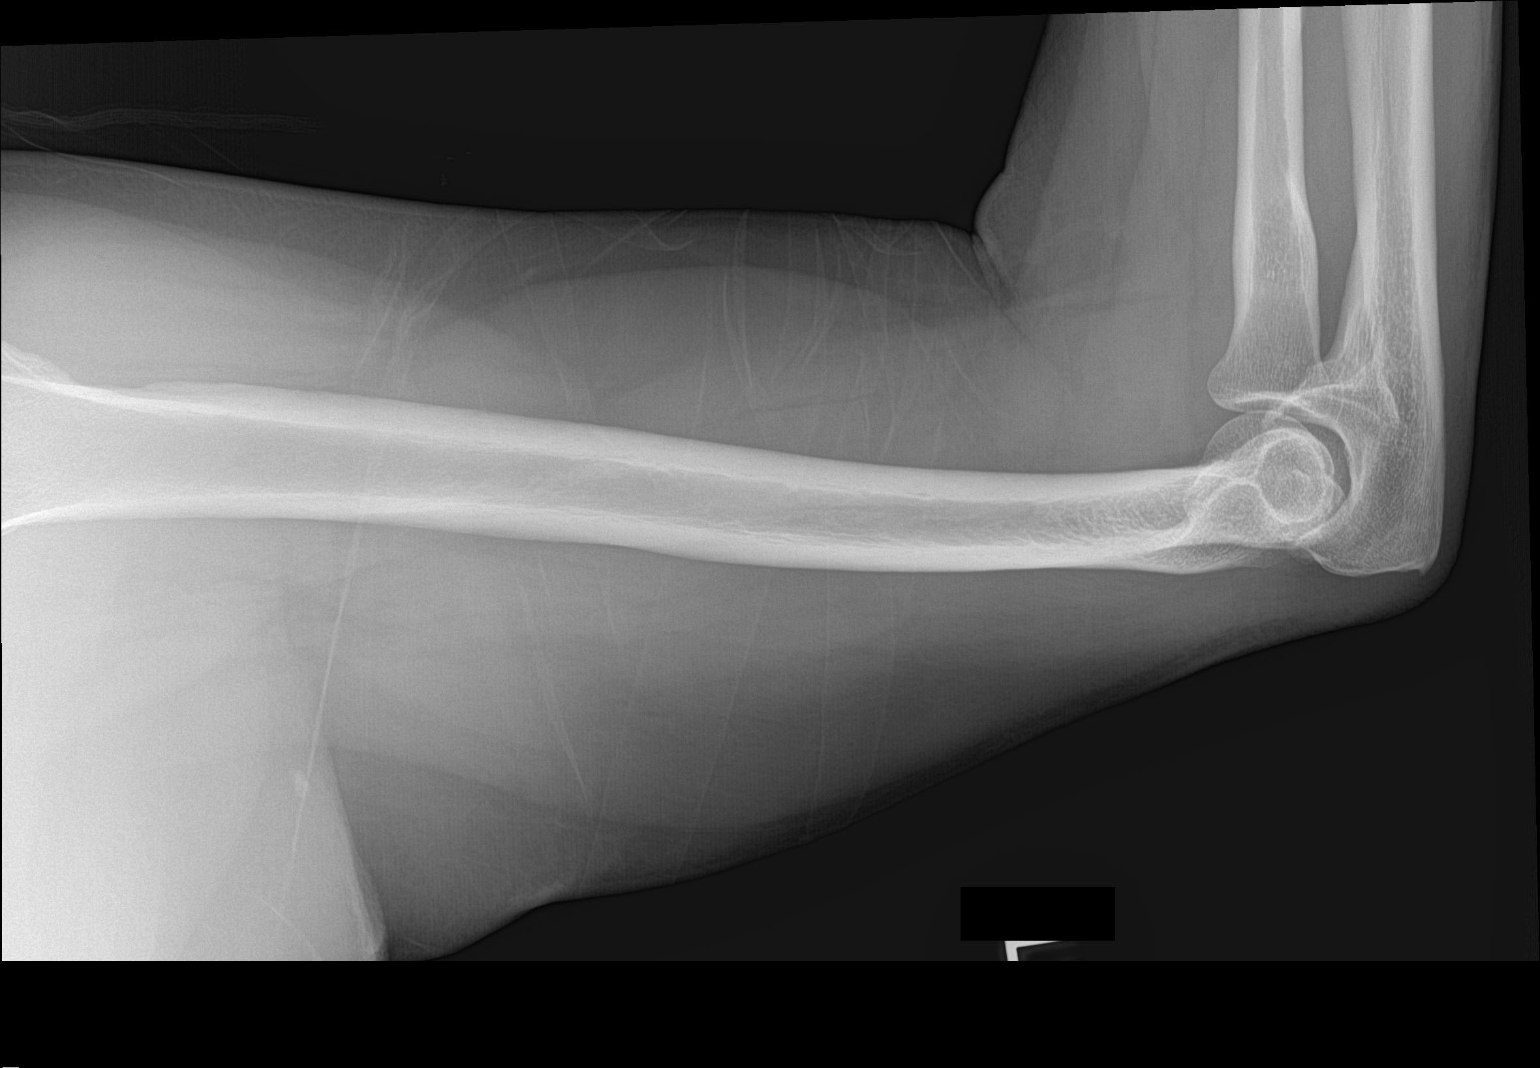

[3 of 3 positions shown; findings below may reference images not displayed]

FINDINGS: When compared to the PET-CT images, the focus of activity on PET
scan does correspond to a subtle lytic lesion involving the left
humeral neck medially consistent with a lytic metastasis. There is
thinning and some absence of the cortical margin at that site. No
other left humeral abnormality is noted.
IMPRESSION: Lytic lesion in the left humeral neck medially suspicious for
metastasis.
